# Patient Record
Sex: Female | Born: 1960 | Hispanic: Yes | Marital: Single | Smoking: Never smoker
Health system: Southern US, Community
[De-identification: ages and names within clinical notes are randomized; demographics above are authoritative.]

## PROBLEM LIST (undated history)

## (undated) DIAGNOSIS — Z9889 Other specified postprocedural states: Secondary | ICD-10-CM

## (undated) DIAGNOSIS — K649 Unspecified hemorrhoids: Secondary | ICD-10-CM

## (undated) DIAGNOSIS — R112 Nausea with vomiting, unspecified: Secondary | ICD-10-CM

## (undated) DIAGNOSIS — F419 Anxiety disorder, unspecified: Secondary | ICD-10-CM

## (undated) DIAGNOSIS — N390 Urinary tract infection, site not specified: Secondary | ICD-10-CM

## (undated) DIAGNOSIS — F32A Depression, unspecified: Secondary | ICD-10-CM

## (undated) DIAGNOSIS — J189 Pneumonia, unspecified organism: Secondary | ICD-10-CM

## (undated) DIAGNOSIS — T8859XA Other complications of anesthesia, initial encounter: Secondary | ICD-10-CM

## (undated) DIAGNOSIS — N186 End stage renal disease: Secondary | ICD-10-CM

## (undated) DIAGNOSIS — K219 Gastro-esophageal reflux disease without esophagitis: Secondary | ICD-10-CM

## (undated) DIAGNOSIS — I10 Essential (primary) hypertension: Secondary | ICD-10-CM

## (undated) DIAGNOSIS — K2970 Gastritis, unspecified, without bleeding: Secondary | ICD-10-CM

## (undated) DIAGNOSIS — F329 Major depressive disorder, single episode, unspecified: Secondary | ICD-10-CM

## (undated) DIAGNOSIS — T4145XA Adverse effect of unspecified anesthetic, initial encounter: Secondary | ICD-10-CM

## (undated) DIAGNOSIS — Z8489 Family history of other specified conditions: Secondary | ICD-10-CM

## (undated) DIAGNOSIS — D649 Anemia, unspecified: Secondary | ICD-10-CM

## (undated) HISTORY — DX: Urinary tract infection, site not specified: N39.0

## (undated) HISTORY — DX: Anemia, unspecified: D64.9

---

## 1999-10-11 ENCOUNTER — Observation Stay (HOSPITAL_COMMUNITY): Admission: AD | Admit: 1999-10-11 | Discharge: 1999-10-12 | Payer: Self-pay | Admitting: *Deleted

## 1999-10-11 ENCOUNTER — Encounter (INDEPENDENT_AMBULATORY_CARE_PROVIDER_SITE_OTHER): Payer: Self-pay

## 1999-10-11 ENCOUNTER — Encounter: Payer: Self-pay | Admitting: *Deleted

## 2002-08-11 ENCOUNTER — Inpatient Hospital Stay (HOSPITAL_COMMUNITY): Admission: AD | Admit: 2002-08-11 | Discharge: 2002-08-11 | Payer: Self-pay | Admitting: Family Medicine

## 2002-08-18 ENCOUNTER — Inpatient Hospital Stay (HOSPITAL_COMMUNITY): Admission: AD | Admit: 2002-08-18 | Discharge: 2002-08-18 | Payer: Self-pay | Admitting: Obstetrics and Gynecology

## 2002-08-18 ENCOUNTER — Encounter: Payer: Self-pay | Admitting: Family Medicine

## 2002-10-18 ENCOUNTER — Encounter: Admission: RE | Admit: 2002-10-18 | Discharge: 2002-10-18 | Payer: Self-pay | Admitting: *Deleted

## 2002-10-25 ENCOUNTER — Encounter: Admission: RE | Admit: 2002-10-25 | Discharge: 2002-10-25 | Payer: Self-pay | Admitting: *Deleted

## 2002-11-15 ENCOUNTER — Ambulatory Visit (HOSPITAL_COMMUNITY): Admission: RE | Admit: 2002-11-15 | Discharge: 2002-11-15 | Payer: Self-pay | Admitting: *Deleted

## 2002-11-22 ENCOUNTER — Encounter: Admission: RE | Admit: 2002-11-22 | Discharge: 2002-11-22 | Payer: Self-pay | Admitting: *Deleted

## 2002-11-29 ENCOUNTER — Encounter: Admission: RE | Admit: 2002-11-29 | Discharge: 2002-11-29 | Payer: Self-pay | Admitting: *Deleted

## 2002-12-06 ENCOUNTER — Encounter: Admission: RE | Admit: 2002-12-06 | Discharge: 2002-12-06 | Payer: Self-pay | Admitting: *Deleted

## 2002-12-13 ENCOUNTER — Encounter: Admission: RE | Admit: 2002-12-13 | Discharge: 2002-12-13 | Payer: Self-pay | Admitting: *Deleted

## 2002-12-28 HISTORY — PX: TUBAL LIGATION: SHX77

## 2003-01-10 ENCOUNTER — Encounter: Admission: RE | Admit: 2003-01-10 | Discharge: 2003-01-10 | Payer: Self-pay | Admitting: *Deleted

## 2003-01-14 ENCOUNTER — Inpatient Hospital Stay (HOSPITAL_COMMUNITY): Admission: AD | Admit: 2003-01-14 | Discharge: 2003-01-14 | Payer: Self-pay | Admitting: *Deleted

## 2003-01-24 ENCOUNTER — Encounter: Admission: RE | Admit: 2003-01-24 | Discharge: 2003-01-24 | Payer: Self-pay | Admitting: *Deleted

## 2003-02-07 ENCOUNTER — Encounter: Admission: RE | Admit: 2003-02-07 | Discharge: 2003-02-07 | Payer: Self-pay | Admitting: *Deleted

## 2003-02-09 ENCOUNTER — Ambulatory Visit (HOSPITAL_COMMUNITY): Admission: RE | Admit: 2003-02-09 | Discharge: 2003-02-09 | Payer: Self-pay | Admitting: *Deleted

## 2003-02-09 ENCOUNTER — Encounter: Payer: Self-pay | Admitting: *Deleted

## 2003-02-21 ENCOUNTER — Encounter: Admission: RE | Admit: 2003-02-21 | Discharge: 2003-02-21 | Payer: Self-pay | Admitting: *Deleted

## 2003-02-21 ENCOUNTER — Encounter (HOSPITAL_COMMUNITY): Admission: RE | Admit: 2003-02-21 | Discharge: 2003-03-15 | Payer: Self-pay | Admitting: *Deleted

## 2003-02-28 ENCOUNTER — Encounter: Admission: RE | Admit: 2003-02-28 | Discharge: 2003-02-28 | Payer: Self-pay | Admitting: *Deleted

## 2003-03-07 ENCOUNTER — Encounter: Payer: Self-pay | Admitting: *Deleted

## 2003-03-07 ENCOUNTER — Encounter: Admission: RE | Admit: 2003-03-07 | Discharge: 2003-03-07 | Payer: Self-pay | Admitting: *Deleted

## 2003-03-14 ENCOUNTER — Inpatient Hospital Stay (HOSPITAL_COMMUNITY): Admission: AD | Admit: 2003-03-14 | Discharge: 2003-03-19 | Payer: Self-pay | Admitting: Obstetrics and Gynecology

## 2003-03-14 ENCOUNTER — Encounter: Admission: RE | Admit: 2003-03-14 | Discharge: 2003-03-14 | Payer: Self-pay | Admitting: *Deleted

## 2003-03-15 ENCOUNTER — Encounter (INDEPENDENT_AMBULATORY_CARE_PROVIDER_SITE_OTHER): Payer: Self-pay

## 2003-03-16 ENCOUNTER — Encounter (INDEPENDENT_AMBULATORY_CARE_PROVIDER_SITE_OTHER): Payer: Self-pay

## 2003-09-26 ENCOUNTER — Encounter (INDEPENDENT_AMBULATORY_CARE_PROVIDER_SITE_OTHER): Payer: Self-pay | Admitting: Specialist

## 2003-09-26 ENCOUNTER — Encounter: Payer: Self-pay | Admitting: Nephrology

## 2003-09-26 ENCOUNTER — Ambulatory Visit (HOSPITAL_COMMUNITY): Admission: RE | Admit: 2003-09-26 | Discharge: 2003-09-27 | Payer: Self-pay | Admitting: Nephrology

## 2004-02-06 ENCOUNTER — Ambulatory Visit (HOSPITAL_COMMUNITY): Admission: RE | Admit: 2004-02-06 | Discharge: 2004-02-06 | Payer: Self-pay | Admitting: Family Medicine

## 2004-03-09 ENCOUNTER — Emergency Department (HOSPITAL_COMMUNITY): Admission: EM | Admit: 2004-03-09 | Discharge: 2004-03-09 | Payer: Self-pay | Admitting: Emergency Medicine

## 2004-07-23 ENCOUNTER — Inpatient Hospital Stay (HOSPITAL_COMMUNITY): Admission: AD | Admit: 2004-07-23 | Discharge: 2004-07-23 | Payer: Self-pay | Admitting: Obstetrics and Gynecology

## 2004-07-30 ENCOUNTER — Encounter: Admission: RE | Admit: 2004-07-30 | Discharge: 2004-07-30 | Payer: Self-pay | Admitting: *Deleted

## 2004-07-30 ENCOUNTER — Inpatient Hospital Stay (HOSPITAL_COMMUNITY): Admission: AD | Admit: 2004-07-30 | Discharge: 2004-07-30 | Payer: Self-pay | Admitting: Obstetrics and Gynecology

## 2004-08-03 ENCOUNTER — Inpatient Hospital Stay (HOSPITAL_COMMUNITY): Admission: AD | Admit: 2004-08-03 | Discharge: 2004-08-03 | Payer: Self-pay | Admitting: Family Medicine

## 2004-08-07 ENCOUNTER — Encounter: Admission: RE | Admit: 2004-08-07 | Discharge: 2004-08-07 | Payer: Self-pay | Admitting: Obstetrics and Gynecology

## 2004-08-22 ENCOUNTER — Encounter: Admission: RE | Admit: 2004-08-22 | Discharge: 2004-08-22 | Payer: Self-pay | Admitting: Family Medicine

## 2004-09-19 ENCOUNTER — Ambulatory Visit: Payer: Self-pay | Admitting: Nurse Practitioner

## 2005-05-11 ENCOUNTER — Ambulatory Visit: Payer: Self-pay | Admitting: Nurse Practitioner

## 2005-05-27 ENCOUNTER — Ambulatory Visit: Payer: Self-pay | Admitting: Nurse Practitioner

## 2005-05-28 ENCOUNTER — Ambulatory Visit: Payer: Self-pay | Admitting: *Deleted

## 2005-06-10 ENCOUNTER — Ambulatory Visit: Payer: Self-pay | Admitting: Nurse Practitioner

## 2005-06-24 ENCOUNTER — Ambulatory Visit: Payer: Self-pay | Admitting: Nurse Practitioner

## 2005-07-03 ENCOUNTER — Ambulatory Visit (HOSPITAL_COMMUNITY): Admission: RE | Admit: 2005-07-03 | Discharge: 2005-07-03 | Payer: Self-pay | Admitting: Family Medicine

## 2005-07-28 ENCOUNTER — Ambulatory Visit: Payer: Self-pay | Admitting: Nurse Practitioner

## 2006-03-18 ENCOUNTER — Ambulatory Visit: Payer: Self-pay | Admitting: Nurse Practitioner

## 2006-04-07 ENCOUNTER — Ambulatory Visit: Payer: Self-pay | Admitting: Nurse Practitioner

## 2006-04-13 ENCOUNTER — Ambulatory Visit: Payer: Self-pay | Admitting: Nurse Practitioner

## 2006-04-29 ENCOUNTER — Encounter: Admission: RE | Admit: 2006-04-29 | Discharge: 2006-04-29 | Payer: Self-pay | Admitting: Internal Medicine

## 2006-12-02 ENCOUNTER — Inpatient Hospital Stay (HOSPITAL_COMMUNITY): Admission: AD | Admit: 2006-12-02 | Discharge: 2006-12-02 | Payer: Self-pay | Admitting: Obstetrics & Gynecology

## 2006-12-08 ENCOUNTER — Ambulatory Visit: Payer: Self-pay | Admitting: Nurse Practitioner

## 2007-08-22 ENCOUNTER — Ambulatory Visit: Payer: Self-pay | Admitting: Internal Medicine

## 2007-09-14 ENCOUNTER — Encounter (INDEPENDENT_AMBULATORY_CARE_PROVIDER_SITE_OTHER): Payer: Self-pay | Admitting: *Deleted

## 2007-10-13 ENCOUNTER — Ambulatory Visit: Payer: Self-pay | Admitting: Internal Medicine

## 2009-02-26 ENCOUNTER — Ambulatory Visit: Payer: Self-pay | Admitting: Internal Medicine

## 2009-02-27 ENCOUNTER — Encounter (INDEPENDENT_AMBULATORY_CARE_PROVIDER_SITE_OTHER): Payer: Self-pay | Admitting: Internal Medicine

## 2009-02-27 LAB — CONVERTED CEMR LAB
AST: 16 units/L (ref 0–37)
Alkaline Phosphatase: 105 units/L (ref 39–117)
Microalb, Ur: 201.02 mg/dL — ABNORMAL HIGH (ref 0.00–1.89)
Sodium: 139 meq/L (ref 135–145)
Total Bilirubin: 0.2 mg/dL — ABNORMAL LOW (ref 0.3–1.2)
Total Protein: 7.1 g/dL (ref 6.0–8.3)

## 2009-03-25 ENCOUNTER — Ambulatory Visit: Payer: Self-pay | Admitting: Internal Medicine

## 2009-03-25 ENCOUNTER — Encounter (INDEPENDENT_AMBULATORY_CARE_PROVIDER_SITE_OTHER): Payer: Self-pay | Admitting: Internal Medicine

## 2009-03-25 LAB — CONVERTED CEMR LAB
ALT: 8 units/L (ref 0–35)
AST: 10 units/L (ref 0–37)
Albumin: 4.1 g/dL (ref 3.5–5.2)
CO2: 16 meq/L — ABNORMAL LOW (ref 19–32)
Calcium: 8.5 mg/dL (ref 8.4–10.5)
Chloride: 111 meq/L (ref 96–112)
Glucose, Bld: 85 mg/dL (ref 70–99)
Microalb, Ur: 203.95 mg/dL — ABNORMAL HIGH (ref 0.00–1.89)
Potassium: 4.1 meq/L (ref 3.5–5.3)
Sodium: 137 meq/L (ref 135–145)

## 2009-03-28 ENCOUNTER — Ambulatory Visit: Payer: Self-pay | Admitting: Internal Medicine

## 2009-04-11 ENCOUNTER — Ambulatory Visit: Payer: Self-pay | Admitting: Internal Medicine

## 2009-05-21 ENCOUNTER — Ambulatory Visit: Payer: Self-pay | Admitting: Internal Medicine

## 2009-06-07 ENCOUNTER — Encounter: Admission: RE | Admit: 2009-06-07 | Discharge: 2009-06-07 | Payer: Self-pay | Admitting: Nephrology

## 2009-06-25 ENCOUNTER — Ambulatory Visit: Payer: Self-pay | Admitting: Vascular Surgery

## 2009-07-05 ENCOUNTER — Ambulatory Visit (HOSPITAL_COMMUNITY): Admission: RE | Admit: 2009-07-05 | Discharge: 2009-07-05 | Payer: Self-pay | Admitting: Vascular Surgery

## 2009-07-05 ENCOUNTER — Ambulatory Visit: Payer: Self-pay | Admitting: Vascular Surgery

## 2009-07-05 HISTORY — PX: AV FISTULA PLACEMENT: SHX1204

## 2009-07-22 ENCOUNTER — Emergency Department (HOSPITAL_COMMUNITY): Admission: EM | Admit: 2009-07-22 | Discharge: 2009-07-23 | Payer: Self-pay | Admitting: Emergency Medicine

## 2009-08-06 ENCOUNTER — Ambulatory Visit: Payer: Self-pay | Admitting: Vascular Surgery

## 2010-03-25 DIAGNOSIS — I12 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 disease: Secondary | ICD-10-CM | POA: Insufficient documentation

## 2010-04-01 ENCOUNTER — Ambulatory Visit (HOSPITAL_COMMUNITY): Admission: RE | Admit: 2010-04-01 | Discharge: 2010-04-01 | Payer: Self-pay | Admitting: Vascular Surgery

## 2010-04-01 ENCOUNTER — Ambulatory Visit: Payer: Self-pay | Admitting: Vascular Surgery

## 2010-04-08 ENCOUNTER — Emergency Department (HOSPITAL_COMMUNITY): Admission: EM | Admit: 2010-04-08 | Discharge: 2010-04-08 | Payer: Self-pay | Admitting: Emergency Medicine

## 2010-04-23 ENCOUNTER — Ambulatory Visit: Payer: Self-pay | Admitting: Vascular Surgery

## 2010-05-02 ENCOUNTER — Ambulatory Visit (HOSPITAL_COMMUNITY): Admission: RE | Admit: 2010-05-02 | Discharge: 2010-05-02 | Payer: Self-pay | Admitting: Vascular Surgery

## 2010-05-02 ENCOUNTER — Ambulatory Visit: Payer: Self-pay | Admitting: Vascular Surgery

## 2010-05-08 ENCOUNTER — Encounter (INDEPENDENT_AMBULATORY_CARE_PROVIDER_SITE_OTHER): Payer: Self-pay | Admitting: Family Medicine

## 2010-05-19 ENCOUNTER — Encounter (INDEPENDENT_AMBULATORY_CARE_PROVIDER_SITE_OTHER): Payer: Self-pay | Admitting: *Deleted

## 2010-07-04 ENCOUNTER — Ambulatory Visit: Payer: Self-pay

## 2010-07-04 ENCOUNTER — Ambulatory Visit (HOSPITAL_COMMUNITY): Admission: RE | Admit: 2010-07-04 | Discharge: 2010-07-04 | Payer: Self-pay | Admitting: Nephrology

## 2010-07-04 ENCOUNTER — Ambulatory Visit: Payer: Self-pay | Admitting: Cardiology

## 2010-07-16 ENCOUNTER — Ambulatory Visit: Payer: Self-pay | Admitting: Vascular Surgery

## 2010-08-25 ENCOUNTER — Ambulatory Visit (HOSPITAL_COMMUNITY): Admission: RE | Admit: 2010-08-25 | Discharge: 2010-08-25 | Payer: Self-pay | Admitting: Vascular Surgery

## 2010-08-25 ENCOUNTER — Ambulatory Visit: Payer: Self-pay | Admitting: Vascular Surgery

## 2010-09-22 ENCOUNTER — Ambulatory Visit (HOSPITAL_COMMUNITY): Admission: RE | Admit: 2010-09-22 | Discharge: 2010-09-22 | Payer: Self-pay | Admitting: Nephrology

## 2010-10-22 ENCOUNTER — Encounter (INDEPENDENT_AMBULATORY_CARE_PROVIDER_SITE_OTHER): Payer: Self-pay | Admitting: *Deleted

## 2010-10-22 LAB — CONVERTED CEMR LAB
Cholesterol: 167 mg/dL (ref 0–200)
Total CHOL/HDL Ratio: 2.8
Triglycerides: 149 mg/dL (ref <150)

## 2011-01-17 ENCOUNTER — Encounter: Payer: Self-pay | Admitting: Family Medicine

## 2011-01-27 NOTE — Progress Notes (Signed)
Summary: Office Visit  Office Visit   Imported By: Charlton Amor, [place] 07/09/2010 09:24:43  _____________________________________________________________________  External Attachment:    Type:   Image     Comment:   External Document

## 2011-03-17 LAB — POCT I-STAT, CHEM 8
BUN: 16 mg/dL (ref 6–23)
Calcium, Ion: 0.96 mmol/L — ABNORMAL LOW (ref 1.12–1.32)
Glucose, Bld: 85 mg/dL (ref 70–99)
HCT: 44 % (ref 36.0–46.0)
Hemoglobin: 15 g/dL (ref 12.0–15.0)
Potassium: 3.7 mEq/L (ref 3.5–5.1)
Sodium: 136 mEq/L (ref 135–145)
TCO2: 27 mmol/L (ref 0–100)

## 2011-03-18 LAB — POCT I-STAT, CHEM 8
BUN: 11 mg/dL (ref 6–23)
Calcium, Ion: 1.06 mmol/L — ABNORMAL LOW (ref 1.12–1.32)
Creatinine, Ser: 4.4 mg/dL — ABNORMAL HIGH (ref 0.4–1.2)
HCT: 33 % — ABNORMAL LOW (ref 36.0–46.0)
Hemoglobin: 11.2 g/dL — ABNORMAL LOW (ref 12.0–15.0)
Sodium: 135 mEq/L (ref 135–145)
TCO2: 28 mmol/L (ref 0–100)

## 2011-03-18 LAB — GLUCOSE, CAPILLARY: Glucose-Capillary: 108 mg/dL — ABNORMAL HIGH (ref 70–99)

## 2011-03-18 LAB — POCT I-STAT 4, (NA,K, GLUC, HGB,HCT): HCT: 24 % — ABNORMAL LOW (ref 36.0–46.0)

## 2011-04-05 LAB — COMPREHENSIVE METABOLIC PANEL
Albumin: 3.3 g/dL — ABNORMAL LOW (ref 3.5–5.2)
BUN: 46 mg/dL — ABNORMAL HIGH (ref 6–23)
Creatinine, Ser: 4.84 mg/dL — ABNORMAL HIGH (ref 0.4–1.2)
Glucose, Bld: 93 mg/dL (ref 70–99)
Potassium: 3.6 mEq/L (ref 3.5–5.1)
Total Protein: 6.8 g/dL (ref 6.0–8.3)

## 2011-04-05 LAB — POCT I-STAT 4, (NA,K, GLUC, HGB,HCT)
Glucose, Bld: 100 mg/dL — ABNORMAL HIGH (ref 70–99)
HCT: 33 % — ABNORMAL LOW (ref 36.0–46.0)
Hemoglobin: 11.2 g/dL — ABNORMAL LOW (ref 12.0–15.0)
Sodium: 139 mEq/L (ref 135–145)

## 2011-04-05 LAB — DIFFERENTIAL
Eosinophils Absolute: 0.1 10*3/uL (ref 0.0–0.7)
Eosinophils Relative: 1 % (ref 0–5)
Neutro Abs: 5.1 10*3/uL (ref 1.7–7.7)
Neutrophils Relative %: 64 % (ref 43–77)

## 2011-04-05 LAB — CBC
MCV: 88.4 fL (ref 78.0–100.0)
RBC: 3.42 MIL/uL — ABNORMAL LOW (ref 3.87–5.11)
WBC: 7.9 10*3/uL (ref 4.0–10.5)

## 2011-04-05 LAB — LIPASE, BLOOD: Lipase: 47 U/L (ref 11–59)

## 2011-04-15 ENCOUNTER — Ambulatory Visit: Payer: Self-pay | Admitting: Vascular Surgery

## 2011-04-21 ENCOUNTER — Other Ambulatory Visit (HOSPITAL_COMMUNITY): Payer: Self-pay | Admitting: Family Medicine

## 2011-04-21 DIAGNOSIS — Z1231 Encounter for screening mammogram for malignant neoplasm of breast: Secondary | ICD-10-CM

## 2011-04-22 ENCOUNTER — Ambulatory Visit (INDEPENDENT_AMBULATORY_CARE_PROVIDER_SITE_OTHER): Payer: Self-pay | Admitting: Vascular Surgery

## 2011-04-22 DIAGNOSIS — N186 End stage renal disease: Secondary | ICD-10-CM

## 2011-04-22 DIAGNOSIS — T82898A Other specified complication of vascular prosthetic devices, implants and grafts, initial encounter: Secondary | ICD-10-CM

## 2011-04-23 NOTE — Assessment & Plan Note (Signed)
OFFICE VISIT  Sabrina Mitchell, [place] Sabrina Mitchell DOB:  1961/06/16                                       04/22/2011 [id number]  I saw patient in the office today for evaluation for possible steal of the left upper extremity.  The history is obtained through her translator.  She had a left brachial cephalic fistula placed in July 2010.  In May 2011, she had a fistulogram because of a poorly maturing fistula, but this showed no evidence of narrowing within the fistula. It was 8 mm in diameter.  Apparently the function improved, and she has been using this for dialysis without any problems.  However, approximately 6 months ago she noted the gradual onset of pain in her left hand, which has been gradually progressing.  Through the translator, she describes significant pain in the left forearm and hand, which increases when she is on dialysis.  There are no other aggravating or alleviating factors.  PAST MEDICAL HISTORY:  Significant for chronic kidney disease, hypertension, and hypercholesterolemia.  She denies any history of diabetes, history of previous myocardial infarction, history of congestive heart failure, history of COPD.  SOCIAL HISTORY:  She is married.  She has 2 children.  She does not use tobacco.  FAMILY HISTORY:  There is no history of premature cardiovascular disease.  REVIEW OF SYSTEMS:  GENERAL:  She has had no recent weight loss, weight gain, or problems with her appetite. CARDIOVASCULAR:  She does describe some occasional chest pain which has been stable.  She had no orthopnea or significant dyspnea on exertion. She has had no history of stroke or TIAs.  She has had no history of DVT or phlebitis.  NEUROLOGIC:  She has occasional dizziness and headaches. HEMATOLOGIC:  She does have a history of anemia. PSYCHIATRIC:  She had a history of anxiety. URINARY:  She has no dysuria or frequency. GI, pulmonary, ENT, musculoskeletal,  integumentary review of systems is unremarkable and is documented on the medical history form in her chart.  PHYSICAL EXAMINATION:  This is a pleasant 50 year old woman who appears her stated age.  Blood pressure is 110/66, heart rate is 69, respiratory rate is 20.  HEENT:  Unremarkable.  Lungs are clear bilaterally to auscultation without rales, rhonchi or wheezing.  Cardiovascular:  I do not detect any carotid bruits.  She has a regular rate and rhythm.  She has palpable femoral pulses.  She has palpable but slightly diminished left radial pulse with a palpable right radial pulse.  She has a monophasic radial, ulnar, and palmar arch signal with a Doppler which augments with compression of her fistula.  Abdomen:  Soft and nontender with normal-pitched bowel sounds.  Musculoskeletal:  There are no major deformities or cyanosis.  Her upper arm fistula has an excellent thrill. Neurologic:  She has no focal weakness or paresthesias.  Skin:  There are no ulcers or rashes.  I have reviewed her previous fistulogram which was performed by Dr. Oneida Alar and showed no problems with her fistula.  Based on her history, I think she does have evidence of a steal syndrome with significant augmentation of the Doppler signals in her hand with compression of her fistula.  I think she should be evaluated for a DRIL procedure.  For this reason, I have recommended we proceed with upper extremity arteriography on the left via a right  femoral approach to fully assess the arterial system on the right and plan a possible DRIL procedure.  I think this is the best chance of maintaining her access and resolving her steal symptoms.  This has been scheduled for May 7. We will make further recommendations pending these results.    Judeth Cornfield. Scot Dock, M.D. Electronically Signed  CSD/MEDQ  D:  04/22/2011  T:  04/23/2011  Job:  [id number]  cc:   Louis Meckel, M.D.

## 2011-04-24 ENCOUNTER — Ambulatory Visit (HOSPITAL_COMMUNITY): Payer: Self-pay

## 2011-05-01 ENCOUNTER — Ambulatory Visit (HOSPITAL_COMMUNITY)
Admission: RE | Admit: 2011-05-01 | Discharge: 2011-05-01 | Disposition: A | Discharge status: Home / Self Care | Payer: Self-pay | Source: Ambulatory Visit | Attending: Family Medicine | Admitting: Family Medicine

## 2011-05-01 DIAGNOSIS — Z1231 Encounter for screening mammogram for malignant neoplasm of breast: Secondary | ICD-10-CM | POA: Insufficient documentation

## 2011-05-04 ENCOUNTER — Ambulatory Visit (HOSPITAL_COMMUNITY): Admission: RE | Admit: 2011-05-04 | Payer: Self-pay | Source: Ambulatory Visit | Admitting: Vascular Surgery

## 2011-05-12 NOTE — Assessment & Plan Note (Signed)
OFFICE VISIT   Sabrina Mitchell  DOB:  09-17-1961                                       07/16/2010  [id number]   The patient returns for followup today.  She recently had a fistulogram  which showed no significant stenosis.  Her daughter says today that they  dialyzed her successfully the last three times using the fistula alone.  She does still have a right-sided catheter in place.   PHYSICAL EXAM:  Today blood pressure is 136/76 in the right arm, heart  rate 66 and regular.  Temperature is 97.5.  Right-sided catheter has no  evidence of infection.  She has an easily palpable thrill in the  brachiocephalic fistula and this is becoming more prominent over time.  Daughter also states she is exercising the fistula.   At this point the patient's fistula is working well and they are  successfully cannulating it without difficulty.  She will follow up with  [place] on an as-needed basis.  The dialysis center will call to schedule  catheter removal if they are content with her fistula in the near  future.     Jessy Oto. Fields, MD  Electronically Signed   CEF/MEDQ  D:  07/16/2010  T:  07/17/2010  Job:  [id number]   cc:   Sherril Croon, M.D.

## 2011-05-12 NOTE — Op Note (Signed)
NAME:  Sabrina Mitchell      ACCOUNT NO.:  [id number]   MEDICAL RECORD NO.:  [id number]          PATIENT TYPE:  AMB   LOCATION:  SDS                          FACILITY:  [facility]   PHYSICIAN:  Nelda Severe. Kellie Simmering, M.D.  DATE OF BIRTH:  25-Dec-1961   DATE OF PROCEDURE:  07/05/2009  DATE OF DISCHARGE:  07/05/2009                               OPERATIVE REPORT   PREOPERATIVE DIAGNOSIS:  End-stage renal disease.   POSTOPERATIVE DIAGNOSIS:  End-stage renal disease.   OPERATION:  Creation of a left upper arm brachial artery to cephalic  vein arteriovenous fistula Terie Purser shunt).   SURGEON:  Nelda Severe. Kellie Simmering, MD   FIRST ASSISTANT:  Jacinta Shoe, P.A.   ANESTHESIA:  Local.   PROCEDURE:  The patient was taken to the operating room, placed in  supine position at which time the left upper extremity was prepped with  Betadine scrub and solution, and draped in routine sterile manner.  After infiltration of 1% Xylocaine with epinephrine, transverse incision  was made in the antecubital area, antecubital vein was dissected free.  Cephalic branch was about 3-3.5 mm in size, dissected free distally,  transected after ligating it.  Brachial artery was exposed beneath the  fascia, was a small vessel 2.5 mm in size, but had an excellent pulse.  It was occluded proximally and distally with vessel loops, opened with  15 blade, and extended with Pott scissors.  Vein was carefully measured  and spatulated and anastomosed end-to-side with 6-0 Prolene.  Vessel  loops then released.  There was an excellent pulse and thrill in the  fistula.  No heparin or protamine was given.  Wound was irrigated with  saline, closed in layers with Vicryl in subcuticular fashion.  Sterile  dressing applied.  The patient taken to recovery room in satisfactory  condition.      Nelda Severe Kellie Simmering, M.D.  Electronically Signed     JDL/MEDQ  D:  07/05/2009  T:  07/06/2009  Job:  [id number]

## 2011-05-12 NOTE — Consult Note (Signed)
NEW PATIENT CONSULTATION   Sabrina Mitchell  DOB:  [birth year]                                       06/25/2009  [id number]   The patient is a Hispanic female patient with end-stage renal disease  not yet on hemodialysis.  She was evaluated today for vascular access  and referred by Dr. Justin Mend.  She is right-handed.   On examination today she has blood pressure 150/93, heart rate 79.  She  has palpable brachial and radial pulses at 3+ bilaterally.  Both hands  are well-perfused.   Vein mapping revealed patent cephalic veins in both upper extremities  which become adequate size at about the antecubital area.  Left arm is  slightly larger than ideal, but hopefully will be a candidate for upper  extremity fistula on the left.  Will schedule that for Friday, July 9 at  [facility] as an outpatient and hopefully this will provide  satisfactory site for vascular access.   Sabrina Mitchell, M.D.  Electronically Signed   JDL/MEDQ  D:  06/25/2009  T:  06/26/2009  Job:  [id number]   cc:   Sherril Croon, M.D.

## 2011-05-12 NOTE — Procedures (Signed)
CEPHALIC VEIN MAPPING   INDICATION:  End-stage renal disease.   HISTORY:  End-stage renal disease.   EXAM:   The right cephalic vein is compressible.   Diameter measurements range from 0.37 to 0.54.   The left cephalic vein is compressible.   Diameter measurements range from 0.25 to 0.41   See attached worksheet for all measurements.   IMPRESSION:  Patent bilateral cephalic veins which are of acceptable  diameter for use as a dialysis access site.   ___________________________________________  Nelda Severe. Kellie Simmering, M.D.   MG/MEDQ  D:  06/25/2009  T:  06/25/2009  Job:  [id number]

## 2011-05-12 NOTE — Assessment & Plan Note (Signed)
OFFICE VISIT   Carlos American  DOB:  1961/07/08                                       08/06/2009  [id number]   The patient had an AV fistula the left upper arm created by me on July  9.  The fistula today has an excellent pulse and palpable thrill up with  a well-healed incision.  There is no evidence of steal distally with  warm, well-perfused hand.  Fistula can be utilized in early October,  although patient is not currently on dialysis.  Return to [place] on a p.r.n.  basis.   Nelda Severe Kellie Simmering, M.D.  Electronically Signed   JDL/MEDQ  D:  08/06/2009  T:  08/07/2009  Job:  [id number]   cc:   Sherril Croon, M.D.

## 2011-05-12 NOTE — Assessment & Plan Note (Signed)
OFFICE VISIT   Sabrina Mitchell  DOB:  [birth year]                                       04/23/2010  [id number]   The patient returns for followup today.  She recently had a right side  Palindrome catheter placed after infiltration of her left arm AV  fistula.  Her left brachiocephalic AV fistula was placed by Dr. Kellie Simmering  in July of 2010.  It is still overall fairly small in character.  She  states that her catheter has been working well without difficulty.  She  was interviewed today with her daughter as the patient herself speaks no  [place].  Her daughter acted as Optometrist.   REVIEW OF SYSTEMS:  She denies any shortness of breath or chest pain  currently.   PHYSICAL EXAM:  Blood pressure is 114/73 in the left arm, heart rate 64  and regular.  Temperature is 98.2.  Left upper extremity she has an  audible bruit and palpable thrill in the fistula.  However, overall the  fistula is fairly small in character.  Right-sided catheter is in place  with no evidence of infection.   Since the patient's fistula is still fairly small in character and they  have had trouble with infiltration I believe she needs a fistulogram to  see if there is something we can do to make the fistula more prominent  or a better fistula for her.  We have scheduled her for a fistulogram a  week from Friday.  Risks, benefits, possible complications and procedure  details were explained to the patient today including but not limited to  bleeding, infection, possible angioplasty of the fistula.  She  understands and agrees to proceed.     Jessy Oto. Fields, MD  Electronically Signed   CEF/MEDQ  D:  04/23/2010  T:  04/24/2010  Job:  [id number]

## 2011-05-15 NOTE — Op Note (Signed)
NAME:  Sabrina Mitchell                ACCOUNT NO.:  [id number]   MEDICAL RECORD NO.:  [id number]                   PATIENT TYPE:  INP   LOCATION:  9178                                 FACILITY:  [facility]   PHYSICIAN:  Tanya S. Kennon Rounds, M.D.                DATE OF BIRTH:  12-26-1961   DATE OF PROCEDURE:  03/16/2003  DATE OF DISCHARGE:                                 OPERATIVE REPORT   PREOPERATIVE DIAGNOSES:  1. Multiparity.  2. Undesired fertility.   POSTOPERATIVE DIAGNOSES:  1. Multiparity.  2. Undesired fertility.   PROCEDURE:  Postpartum bilateral tubal ligation.   SURGEON:  Standley Dakins. Kennon Rounds, M.D.   ASSISTANT:  __________.   ANESTHESIA:  Epidural.   FINDINGS:  Normal-appearing uterus and tubes and ovaries.   ESTIMATED BLOOD LOSS:  Less than 25 mL.   COMPLICATIONS:  None.   SPECIMENS:  Tubes to pathology.   INDICATIONS:  The patient is a 50 year old G4, para 2-0-2-2 who is  postpartum day 0 from a spontaneous vaginal delivery at 37 weeks that is  complicated by preeclampsia.  She desires permanently.  She was then  counseled regarding the risks and benefits of this procedure including the  permanence of this procedure and risks of pregnancy which includes risks of  ectopic as well as general risks of surgery including bleeding, infection,  injury to surrounding structures and even death.  The patient had all  consents signed and desired to proceed.   DESCRIPTION OF PROCEDURE:  The patient was placed in the supine position.  Her epidural catheter was then dosed with analgesic medicine.  When this was  found to be adequate, she was prepped and draped in the usual sterile  fashion.  An alpha clamp was used to ensure adequate analgesia and 4 mL of  0.25% Marcaine had been injected infraumbilically.  A 1.5 mm incision was  made in the skin infraumbilically.  It was carried down to the underlying  fascia.  The peritoneum was grasped and entered sharply with  Metzenbaum  scissors.  The retractor was then placed inside the abdominal cavity, and  the left tube was identified and followed to its fimbriated end.  This was  grasped with a Babcock clamp approximately 1 cm from the fundus of the  uterus and a 1 cm knuckle of tube was tied with 0 plain suture.  A second 0  plain was tied below this, and the tube was excised and sent to pathology.  Before suturing the abdomen, hemostasis was noted.  Similarly, the right  tube was grasped and followed to its fimbriated end.  A 1 cm knuckle of tube  approximately 1.5 cm from the cornua was brought up in a Babcock clamp and  tied with 0 plain suture.  A second 0 plain was placed below this, and a 1  cm segment of tube was excised.  This tube was sent to pathology.  Hemostasis was obtained, and this  tube was returned to the abdominal cavity.  The fascia was then closed with a 0 Vicryl running suture.  The skin was  closed with a subcuticular 3-0 Vicryl.  The patient tolerated the procedure  well.  All instrument, needle and lap counts were correct x2.  The patient  was awakened and taken to the recovery room in stable condition.                                               Standley Dakins. Kennon Rounds, M.D.    TSP/MEDQ  D:  03/16/2003  T:  03/17/2003  Job:  [id number]

## 2011-05-15 NOTE — Discharge Summary (Signed)
NAME:  Sabrina Mitchell                ACCOUNT NO.:  [id number]   MEDICAL RECORD NO.:  [id number]                   PATIENT TYPE:  INP   LOCATION:  9103                                 FACILITY:  [facility]   PHYSICIAN:  Phil D. Kalman Shan, M.D.                  DATE OF BIRTH:  [birth year]   DATE OF ADMISSION:  03/14/2003  DATE OF DISCHARGE:  03/19/2003                                 DISCHARGE SUMMARY   DISCHARGE DIAGNOSES:  1. Pregnancy induced hypertension.  2. Chronic hypertension.  3. Partial placental abruption.  4. Preeclampsia.  5. Chronic kidney disease.  6. Anemia.   CONSULTATIONS:  OB/GYN.   PROCEDURE:  Bilateral tubal ligation.   PERTINENT LABORATORY INFORMATION:  Final CBC:  White blood cell count 11.2,  hemoglobin 9.6, hematocrit 28.0 with an MCV of 88.8 on March 17, 2003.   HOSPITAL COURSE:  The patient is a G4 now P2-0-2-2 who presented at 78 and 4  weeks by 7-week ultrasound [facility] with [facility].  The patient was noted on admission to have  contractions every two minutes, no vaginal bleeding with the exception of  some spotting.  Membranes were intact and fetal activity was excellent.  The  patient was admitted for [place] superimposed on chronic hypertension with plans  for Cytotec induction.  The patient's induction was begun on March 14, 2003  with Cytotec and was unremarkable with the exception of slow cervical  response to ripening.  The patient was also noted on March 15, 2003 to be  passing some clots which were felt secondary to partial abruption.  The  patient's blood pressure remained stable and baby did well with the  exception of a seven minute deceleration noted March 16, 2003 at  approximately [place].  On March 16, 2003 patient's cervix was noted to be 4 cm  dilated, 75%, and -2.  Pitocin was increased.  The patient delivered on  March 16, 2003 at approximately 9:15 a.m.  NSVD with Apgars of 7 and 9 at  one and five minutes under epidural anesthesia in routine fashion.  Second  degree midline laceration was repaired with 3-0 Vicryl with an estimated  blood loss of 300 mL.  The patient and infant were in stable condition.  That same day patient had bilateral tubal ligation under epidural anesthesia  and did well.   March 19, 2003 patient is postpartum and postoperative day number three,  doing well with complaint only of mild insomnia and of appropriate  incisional tenderness.  The patient's blood pressures have been stable and  is 100/60 on the day of discharge.   PLANS FOR DISCHARGE:  The patient to be discharged home.  Follow up at  [facility] in six weeks.   DISCHARGE MEDICATIONS:  1. Lortab 5/500 to be taken one p.o. q.4h. p.r.n. pain.  2. Ambien 5 mg one p.o. q.h.s. p.r.n. number 20.  3.  Atenolol 50 mg p.o. daily as prior to pregnancy.   DISCHARGE ACTIVITY:  Regular.  Nothing per vagina for six weeks.   PLANS FOR FOLLOWUP:  The patient to follow up as above at [facility] in six weeks.  She is to call for follow-up.     Obstetrics Resident                       Franchot Heidelberg. Kalman Shan, M.D.    OR/MEDQ  D:  03/19/2003  T:  03/19/2003  Job:  [id number]

## 2011-05-15 NOTE — Group Therapy Note (Signed)
NAME:  Tristan Schroeder NO.:  [id number]   MEDICAL RECORD NO.:  [id number]                   PATIENT TYPE:  OUT   LOCATION:  [facility]                           FACILITY:  [facility]   PHYSICIAN:  Darron Doom, MD                     DATE OF BIRTH:  July 25, 1961   DATE OF SERVICE:  08/07/2004                                    CLINIC NOTE   CHIEF COMPLAINT:  Follow-up.   HISTORY OF PRESENT ILLNESS:  The patient is a 50 year old who is status post  tubal ligation last year who ended up pregnant again.  She has chronic  medical problems and received methotrexate followed by Cytotec over the  weekend.  She reports that she had heavy bleeding on Sunday but now it has  decreased; sometimes it is heavier, sometimes it is lighter, but sort of  like her period.  The patient states that she is having no significant pain.  She denies fevers, chills.   PHYSICAL EXAMINATION TODAY:  Her vital signs are as noted on the chart.  She  has had a temperature of 100.6.  Her abdomen is soft, nontender,  nondistended.   IMPRESSION:  Status post abortion.   PLAN:  Will follow her up in 2 more weeks with abstinence.  She will receive  Depo at that time.  She wants to continue this for a year and then try to  talk her husband into getting a vasectomy.  She is to return with heavy  bleeding or fevers and chills.                                               Darron Doom, MD    TP/MEDQ  D:  08/07/2004  T:  08/07/2004  Job:  [id number]

## 2011-10-30 ENCOUNTER — Other Ambulatory Visit (HOSPITAL_COMMUNITY): Payer: Self-pay | Admitting: Nephrology

## 2011-10-30 DIAGNOSIS — N186 End stage renal disease: Secondary | ICD-10-CM

## 2011-11-06 ENCOUNTER — Ambulatory Visit (HOSPITAL_COMMUNITY)
Admission: RE | Admit: 2011-11-06 | Discharge: 2011-11-06 | Disposition: A | Discharge status: Home / Self Care | Payer: Self-pay | Source: Ambulatory Visit | Attending: Nephrology | Admitting: Nephrology

## 2011-11-06 DIAGNOSIS — N186 End stage renal disease: Secondary | ICD-10-CM | POA: Insufficient documentation

## 2011-11-06 DIAGNOSIS — Z992 Dependence on renal dialysis: Secondary | ICD-10-CM | POA: Insufficient documentation

## 2011-11-06 MED ORDER — IOHEXOL 300 MG/ML  SOLN
100.0000 mL | Freq: Once | INTRAMUSCULAR | Status: AC | PRN
  Administered 2011-11-06: 24 mL via INTRAVENOUS

## 2011-11-06 NOTE — Procedures (Signed)
Negative left arm fistulogram.  No intervention.  See radiology report.

## 2011-11-06 NOTE — Discharge Instructions (Signed)
Arteriovenous (AV) Access for Hemodialysis An arteriovenous (AV) access is a surgically created or placed tube that allows for repeated access to the blood in your body. This access is required for hemodialysis, a type of dialysis. Dialysis is a treatment process that filters and cleans the blood in order to eliminate toxic wastes from the body when the kidneys fail to do this on their own. There are several different access methods used for hemodialysis. ACCESS METHODS  Double lumen catheter. A flexible tube with 2 channels may be used on a temporary or long-term basis. A long-term use catheter often has a cuff that holds it in place. The catheter is surgically placed and tunneled under the skin. This catheter is often placed when dialysis is needed in an emergency, such as when the kidneys suddenly stop working. It may also be needed when a permanent AV access fails or has not yet been placed. A catheter is usually placed into one of the following:   Large vein under your collarbone (subclavian vein).   Large vein in your neck (jugular vein).   Temporarily, in the large vein in your groin (femoral vein).   AV graft. A man-made (synthetic) material may be used to connect an artery and vein in the arm or thigh. This graft takes around 2 weeks to develop (mature) and is often placed a few weeks prior to use. A graft can generally last from 1 to 2 years.   AV fistula. A minor surgical procedure may be done to connect an artery and vein, creating a fistula. This causes arterial blood to flow directly into a vein. The vein gets larger, allowing easier access for dialysis. The fistula takes around 12 weeks to mature and must be placed several months before dialysis is anticipated. A fistula provides the best access for hemodialysis and can last for several years.  It is critical that veins in patients at high risk to develop kidney failure are preserved. This may include avoiding blood pressure checks and  intravenous (IV) or lab draws from the arm. This maximizes the chance for creating a functioning AV access when needed. Although a fistula is the most desirable access to use for hemodialysis, it may not be possible. If the veins are not large enough or there is no time to wait for a fistula to mature, a graft or catheter may be used. RISKS AND COMPLICATIONS   Double lumen catheter. A catheter may develop serious infections. It may also develop a clot (thrombosis) and fail. A catheter can cause clots in the vein in which it is placed. A subclavian vein catheter is the most likely to cause thrombosis. When the subclavian vein clots, it makes it very difficult for the patient to sustain an AV graft or fistula. When possible, catheters should be avoided and a more permanent AV access should be placed.   AV graft. A graft may swell after surgery, but this should decrease as it heals. A graft may stop working properly due to thrombosis or the diameter of the tube getting smaller. The tube can eventually become blocked (stenosis). If a partial blockage is found relatively early, it can be treated. Left untreated, the stenosis will progress until the vessel is completely blocked. Infection may also occur.   AV fistula. Infection and thrombosis are the biggest risks with a fistula. However, the rates for infection and stenosis are lower with fistulas than the other 2 methods. Frequent thrombosis may require creating a backup fistula at another site.  This will allow for dialysis when one access is blocked.  HOME CARE INSTRUCTIONS   Keep the site of the cut (incision) clean and dry while it heals. This helps prevent infection.   If you have a catheter, do not shower while the incision is healing. After it is healed, ask your caregiver for recommendations on showering. The bandage (dressing) will be changed at the dialysis center. Do not remove this dressing. However, if it becomes wet or loose, a sterile gauze  dressing may be placed over the site and secured by placing adhesive tape on the edges.   If you have a graft or fistula, clean the site daily until it heals completely. Stitches will be removed, usually after about 10 to 14 days. Once the access is being used for dialysis, a small dressing will be placed over the needle sites after the treatment is done. Keep this dressing on for at least 12 hours. Keep your arm or thigh clean and dry during this time.   A small amount of bleeding is normal, especially if the access is new. If you have a large amount of bleeding and cannot stop it, this is not normal. Call your caregiver right away. You will be taught how to hold your graft or fistula sites to stop the bleeding.  If you have a graft or fistula:  A "bruit" is a noise that is heard with a stethoscope and a "thrill" is a vibration felt over the graft or fistula. The presence of the bruit and thrill indicate that the access is working. You will be taught to feel for the thrill each day. If this is not felt, the access may be clotted. Call your caregiver.   You may use the arm freely after the site heals. Keep the following in mind:   Avoid pressure on the arm.   Avoid lifting heavy objects with the arm.   Avoid sleeping on the arm with the graft or fistula.   Avoid wearing tight-sleeved shirts or jewelry around the graft or fistula.   Do not allow blood pressure monitoring or needle punctures on the side where the graft or fistula is located.   With permission from your caregiver, you may do exercises to help with blood flow through a fistula. These exercises involve squeezing a rubber ball or other soft objects as instructed.  SEEK MEDICAL CARE IF:   Chills develop.   You have an oral temperature above 102 F (38.9 C).   Swelling around the graft or fistula gets worse.   New pain develops.   Unusual bleeding develops.   Pus or other fluid (drainage) is seen at the AV access site.    Skin redness or red streaking is seen on the skin around, above, or below the AV access.  SEEK IMMEDIATE MEDICAL CARE IF:   Pain, numbness, or an unusual pale skin color develops in the hand on the side of your fistula.   Dizziness or weakness develops that you have not had before.   Shortness of breath develops.   Chest pain develops.   The AV access has bleeding that cannot be easily controlled.  Wear a medical alert bracelet to let caregivers know you are a dialysis patient, so they can care for your veins appropriately. Document Released: 03/06/2003 Document Revised: 08/26/2011 Document Reviewed: 05/13/2010 [facility] Patient Information 2012 [place].

## 2011-11-27 ENCOUNTER — Other Ambulatory Visit: Payer: Self-pay | Admitting: Family Medicine

## 2011-11-27 DIAGNOSIS — N644 Mastodynia: Secondary | ICD-10-CM

## 2011-12-11 ENCOUNTER — Other Ambulatory Visit: Payer: Self-pay | Admitting: Family Medicine

## 2011-12-11 ENCOUNTER — Ambulatory Visit
Admission: RE | Admit: 2011-12-11 | Discharge: 2011-12-11 | Disposition: A | Discharge status: Home / Self Care | Payer: No Typology Code available for payment source | Source: Ambulatory Visit | Attending: Family Medicine | Admitting: Family Medicine

## 2011-12-11 DIAGNOSIS — N644 Mastodynia: Secondary | ICD-10-CM

## 2011-12-11 NOTE — Progress Notes (Signed)
Interpreter Dontae Minerva Namihira  10.45 for mammogram but not for ultrasound.

## 2012-01-23 DIAGNOSIS — D509 Iron deficiency anemia, unspecified: Secondary | ICD-10-CM | POA: Insufficient documentation

## 2012-02-18 ENCOUNTER — Emergency Department (HOSPITAL_COMMUNITY): Payer: Self-pay

## 2012-02-18 ENCOUNTER — Emergency Department (HOSPITAL_COMMUNITY)
Admission: EM | Admit: 2012-02-18 | Discharge: 2012-02-18 | Disposition: A | Discharge status: Home / Self Care | Payer: Self-pay | Attending: Emergency Medicine | Admitting: Emergency Medicine

## 2012-02-18 ENCOUNTER — Encounter (HOSPITAL_COMMUNITY): Payer: Self-pay | Admitting: *Deleted

## 2012-02-18 DIAGNOSIS — R748 Abnormal levels of other serum enzymes: Secondary | ICD-10-CM | POA: Insufficient documentation

## 2012-02-18 DIAGNOSIS — R945 Abnormal results of liver function studies: Secondary | ICD-10-CM

## 2012-02-18 DIAGNOSIS — R7989 Other specified abnormal findings of blood chemistry: Secondary | ICD-10-CM | POA: Insufficient documentation

## 2012-02-18 DIAGNOSIS — Z79899 Other long term (current) drug therapy: Secondary | ICD-10-CM | POA: Insufficient documentation

## 2012-02-18 DIAGNOSIS — R109 Unspecified abdominal pain: Secondary | ICD-10-CM | POA: Insufficient documentation

## 2012-02-18 DIAGNOSIS — I12 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 disease: Secondary | ICD-10-CM | POA: Insufficient documentation

## 2012-02-18 DIAGNOSIS — R102 Pelvic and perineal pain: Secondary | ICD-10-CM

## 2012-02-18 DIAGNOSIS — Z992 Dependence on renal dialysis: Secondary | ICD-10-CM

## 2012-02-18 DIAGNOSIS — E669 Obesity, unspecified: Secondary | ICD-10-CM | POA: Insufficient documentation

## 2012-02-18 DIAGNOSIS — N186 End stage renal disease: Secondary | ICD-10-CM | POA: Insufficient documentation

## 2012-02-18 HISTORY — DX: Essential (primary) hypertension: I10

## 2012-02-18 HISTORY — DX: End stage renal disease: N18.6

## 2012-02-18 LAB — URINALYSIS, ROUTINE W REFLEX MICROSCOPIC
Bilirubin Urine: NEGATIVE
Ketones, ur: NEGATIVE mg/dL
Protein, ur: 100 mg/dL — AB
Urobilinogen, UA: 0.2 mg/dL (ref 0.0–1.0)

## 2012-02-18 LAB — CBC
HCT: 34.9 % — ABNORMAL LOW (ref 36.0–46.0)
Hemoglobin: 11.9 g/dL — ABNORMAL LOW (ref 12.0–15.0)
WBC: 6.1 10*3/uL (ref 4.0–10.5)

## 2012-02-18 LAB — LACTIC ACID, PLASMA: Lactic Acid, Venous: 1.3 mmol/L (ref 0.5–2.2)

## 2012-02-18 LAB — DIFFERENTIAL
Basophils Absolute: 0 10*3/uL (ref 0.0–0.1)
Lymphocytes Relative: 16 % (ref 12–46)
Monocytes Absolute: 0.4 10*3/uL (ref 0.1–1.0)
Monocytes Relative: 6 % (ref 3–12)
Neutro Abs: 4.6 10*3/uL (ref 1.7–7.7)

## 2012-02-18 LAB — LIPASE, BLOOD: Lipase: 73 U/L — ABNORMAL HIGH (ref 11–59)

## 2012-02-18 LAB — COMPREHENSIVE METABOLIC PANEL
AST: 47 U/L — ABNORMAL HIGH (ref 0–37)
Alkaline Phosphatase: 66 U/L (ref 39–117)
CO2: 27 mEq/L (ref 19–32)
Chloride: 99 mEq/L (ref 96–112)
Creatinine, Ser: 3.87 mg/dL — ABNORMAL HIGH (ref 0.50–1.10)
GFR calc non Af Amer: 13 mL/min — ABNORMAL LOW (ref >90)
Total Bilirubin: 0.3 mg/dL (ref 0.3–1.2)

## 2012-02-18 LAB — MAGNESIUM: Magnesium: 2.2 mg/dL (ref 1.5–2.5)

## 2012-02-18 LAB — URINE MICROSCOPIC-ADD ON

## 2012-02-18 MED ORDER — ONDANSETRON HCL 4 MG/2ML IJ SOLN
4.0000 mg | Freq: Once | INTRAMUSCULAR | Status: AC
  Administered 2012-02-18: 4 mg via INTRAVENOUS
  Filled 2012-02-18: qty 2

## 2012-02-18 MED ORDER — MORPHINE SULFATE 4 MG/ML IJ SOLN
4.0000 mg | Freq: Once | INTRAMUSCULAR | Status: AC
  Administered 2012-02-18: 4 mg via INTRAVENOUS
  Filled 2012-02-18: qty 1

## 2012-02-18 NOTE — ED Notes (Signed)
Translation line called  Translator [id number] called.  Discharge inst given

## 2012-02-18 NOTE — ED Notes (Signed)
Patient was at dialysis and had completed half her treatment and started c/o abd. Pain states she ate a sandwich , chips, and some candy and her stomach started hurting. Vomited x1. Patient has her shunt in her left arm.

## 2012-02-18 NOTE — ED Provider Notes (Signed)
History     CSN: [id number]  Arrival date & time 02/18/12  1459   First MD Initiated Contact with Patient 02/18/12 1503      Chief Complaint  Patient presents with  . Abdominal Pain    (Consider location/radiation/quality/duration/timing/severity/associated sxs/prior treatment) HPI History provided by the patient.  History mildly limited secondary to language barrier.  51 year old female with history of end-stage renal disease on hemodialysis and hypertension presenting with complaint of abdominal pain.  Patient states her pain is in her suprapubic area and began acutely around 220 pm today (about one hour prior to ED arrival).  Pain is described as severe; however pt cannot describe the character.  Patient states she started dialysis around 12 noon today, ate Doritos, steroids, and some chocolate, and begin having pain soon after completion of her meal.  Patient denies radiation. Pain is associated with nausea and vomiting times one without hematemesis, hematochezia, diarrhea, urinary symptoms, vaginal discharge, or vaginal bleeding. Patient does have a history of a tubal ligation but no other abdominal surgeries.  Patient did take a Tylenol prior to ED arrival and reports mild improvement of her pain. No appreciated worsening factors.   Patient describes one prior episode in the past, without known diagnosis, but improving after Tylenol. Patient has not been seen specifically with current complaint by a different physician.  Patient did complete dialysis 2 hours prior to scheduled completion.   Past Medical History  Diagnosis Date  . Renal disorder   . Hypertension   . End stage renal disease     Past Surgical History  Procedure Date  . Av fistula placement   . Tubal ligation     History reviewed. No pertinent family history.  History  Substance Use Topics  . Smoking status: Never Smoker   . Smokeless tobacco: Not on file  . Alcohol Use: No    OB History    Grav Para  Term Preterm Abortions TAB SAB Ect Mult Living                  Review of Systems  Constitutional: Negative for fever and chills.  HENT: Negative for congestion, sore throat and rhinorrhea.   Eyes: Negative for pain and visual disturbance.  Respiratory: Negative for cough, shortness of breath and wheezing.   Cardiovascular: Negative for chest pain and palpitations.  Gastrointestinal: Positive for nausea, vomiting and abdominal pain. Negative for diarrhea and blood in stool.  Genitourinary: Negative for dysuria and hematuria.       LMP reportedly ~1 yr ago and pt with h/o tubal ligation.  Musculoskeletal: Negative for back pain and gait problem.  Skin: Negative for rash and wound.  Neurological: Negative for dizziness and headaches.  Psychiatric/Behavioral: Negative for confusion and agitation.  All other systems reviewed and are negative.    Allergies  Review of patient's allergies indicates no known allergies.  Home Medications   Current Outpatient Rx  Name Route Sig Dispense Refill  . ACETAMINOPHEN 500 MG PO TABS Oral Take 1,000 mg by mouth every 6 (six) hours as needed. For pain    . AMLODIPINE BESYLATE 5 MG PO TABS Oral Take 5 mg by mouth daily.    Marland Kitchen CALCIUM CARBONATE ANTACID 750 MG PO CHEW Oral Chew 1-2 tablets by mouth See admin instructions. 1 tablet with each meal at home, 2 tablets with a meal on dialysis days    . CARVEDILOL 25 MG PO TABS Oral Take 25 mg by mouth at bedtime.    Marland Kitchen  RENA-VITE PO TABS Oral Take 1 tablet by mouth daily.    Marland Kitchen SEVELAMER CARBONATE 800 MG PO TABS Oral Take 800 mg by mouth 3 (three) times daily with meals.    Marland Kitchen TEMAZEPAM 15 MG PO CAPS Oral Take 15-30 mg by mouth at bedtime.      BP 152/89  Pulse 74  Temp(Src) 97.6 F (36.4 C) (Oral)  Resp 20  SpO2 100%  Physical Exam  Nursing note and vitals reviewed. Constitutional: She is oriented to person, place, and time.       Mildly overweight, anxious and appears uncomfortable with occasional  tearing, alert, mostly Spanish speaking  HENT:  Head: Normocephalic and atraumatic.  Right Ear: External ear normal.  Left Ear: External ear normal.  Nose: Nose normal.  Mouth/Throat: Oropharynx is clear and moist.  Eyes: Conjunctivae and EOM are normal. Pupils are equal, round, and reactive to light.  Neck: Normal range of motion. Neck supple.  Cardiovascular: Normal rate, regular rhythm and intact distal pulses.   No murmur heard. Pulmonary/Chest: Effort normal and breath sounds normal. No respiratory distress. She has no wheezes. She has no rales. She exhibits no tenderness.  Abdominal: Soft. Bowel sounds are normal. She exhibits no distension. There is tenderness (mild in the suprapubic area; no sig upper abd TTP or lateralizing tenderness.). There is no rebound.       Mildly obese  Musculoskeletal: Normal range of motion. She exhibits no edema.       Distal Lt forearm with fistula with thrill.  Neurological: She is alert and oriented to person, place, and time.  Skin: Skin is warm and dry. No rash noted. She is not diaphoretic.  Psychiatric: Judgment normal. Her mood appears anxious.    ED Course  Procedures (including critical care time)  Labs Reviewed  CBC - Abnormal; Notable for the following:    Hemoglobin 11.9 (*)    HCT 34.9 (*)    All other components within normal limits  COMPREHENSIVE METABOLIC PANEL - Abnormal; Notable for the following:    Glucose, Bld 136 (*)    Creatinine, Ser 3.87 (*)    AST 47 (*) HEMOLYSIS AT THIS LEVEL MAY AFFECT RESULT   GFR calc non Af Amer 13 (*)    GFR calc Af Amer 15 (*)    All other components within normal limits  URINALYSIS, ROUTINE W REFLEX MICROSCOPIC - Abnormal; Notable for the following:    pH 8.5 (*)    Glucose, UA 100 (*)    Hgb urine dipstick TRACE (*)    Protein, ur 100 (*)    All other components within normal limits  WET PREP, GENITAL - Abnormal; Notable for the following:    WBC, Wet Prep HPF POC MODERATE (*)    All  other components within normal limits  LIPASE, BLOOD - Abnormal; Notable for the following:    Lipase 73 (*)    All other components within normal limits  URINE MICROSCOPIC-ADD ON - Abnormal; Notable for the following:    Squamous Epithelial / LPF FEW (*)    All other components within normal limits  DIFFERENTIAL  LACTIC ACID, PLASMA  MAGNESIUM  URINE CULTURE  GC/CHLAMYDIA PROBE AMP, GENITAL   Ct Abdomen Pelvis Wo Contrast  02/18/2012  *RADIOLOGY REPORT*  Clinical Data: Abdominal pain and vomiting.  CT ABDOMEN AND PELVIS WITHOUT CONTRAST  Technique:  Multidetector CT imaging of the abdomen and pelvis was performed following the standard protocol without intravenous contrast.  Comparison: None  Findings: The lung bases are clear except for minimal dependent atelectasis and vascular crowding.  No pleural effusion nor pulmonary nodule.  The liver is unremarkable.  No focal lesions or intrahepatic biliary dilatation.  Gallbladder is normal.  No common bile duct dilatation.  Pancreas appears normal.  The spleen is normal in size.  No focal lesions.  The adrenal glands are normal.  The kidneys are small consistent with known renal failure.  No worrisome renal mass or renal calculi.  The stomach, duodenum, small bowel and colon are grossly normal without oral contrast.  The appendix is normal.  The uterus and ovaries are unremarkable.  No mesenteric or retroperitoneal masses or adenopathy.  The aorta is normal in caliber.  The bladder is normal.  No pelvic mass, adenopathy or significant free pelvic fluid collection.  There is a small amount of likely physiologic free fluid.  No inguinal mass or hernia.  The bony structures are unremarkable.  Mild degenerative changes involving the facet joints, SI joints and pubic symphysis.  IMPRESSION: No acute abdominal/pelvic findings.  Original Report Authenticated By: P. Kalman Jewels, M.D.     1. Suprapubic pain, acute   2. End stage renal disease on dialysis     3. Elevated lipase   4. Abnormal liver function test       MDM  51 y.o. F with ESRD on HD presenting with acute suprapubic pain with N/V occuring soon after eating during dialysis; HD terminated early.  No other complaints.  Exam as above, AF/VSS other than mild HTN, in pain, mild suprapubic TTP without peritoneal Sn's.  Ureterolithiasis or UTI possible.  Clinical picture less likely appy, ovarian torsion, PID, GB path, diverticulitis, pancreatitis, or ischemic path.  Labs, morphine, zofran, and CT ordered.  Abd pain completely resolved soon after initial exam.  Labs with mildly elevated lipase and AST but NL tbili and other LFTs; denies EtOH abuse; pt has not pain or TTP in the upper abd and neg murphy's.  CT without Sn of GB, liver, or pancreas issues.  No indication for RUQ u/s at this time.  UA and wet prep without Sn of infection.  Repeat abd improved.  Will have pt f/u with her PCP.  Strict return precautions reviewed.     Chana Bode, MD 02/19/12 0157

## 2012-02-18 NOTE — Discharge Instructions (Signed)
Dolor abdominal, versin ampliada (Abdominal Pain, Nonspecific) El anlisis podra no mostrar la razn exacta por la que tiene dolor abdominal. Debido a que hay muchas causas distintas de dolor abdominal, se podr necesitar otro control y ms anlisis. Es muy importante el seguimiento para observar los sntomas duraderos (persistentes) o los que [place]. Una causa posible de dolor abdominal en cualquier persona que an tiene su apndice es la apendicitis aguda. La apendicitis es a menudo difcil de diagnosticar. Los anlisis de [place], [place], [place] y tomografa computada no pueden descartar por completo la apendicitis u otra causas de dolor abdominal. A veces, slo los cambios que se producen a Designer, jewellery del Engineer, drilling si el dolor abdominal se debe al apendicitis o a otras causas. Otros problemas potenciales que pueden requerir [place] tambin pueden tomar algn [employer] ser evidentes. Debido a esto, es importante seguir todas las instrucciones de ms abajo. INSTRUCCIONES PARA EL CUIDADO DOMICILIARIO  Descanse todo lo que pueda.   No ingiera alimentos slidos hasta que el dolor desaparezca.   Cuando un adulto o un nio siente dolor: Puede beneficiarlo una dieta basada en agua, t liviano descafeinado, caldo o consom, gelatina, solucin de rehidratacin oral, helados de agua o trocitos de hielo.   Cuando el adulto o el nio no sienten ms dolor: Consuma una dieta liviana (tostadas secas, crackers, jugo de [place] o arroz blanco). Incorpore ms alimentos lentamente, siempre que esto no le cause ningn trastorno. No consuma productos lcteos (incluyendo queso y [place]) ni ingiera alimentos condimentados, grasos, fritos o con gran cantidad de [place].   No consuma alcohol, cafena ni cigarrillos.   Tome sus medicamentos regularmente, excepto que el profesional le indique lo contrario.   Utilice los medicamentos de venta libre o de prescripcin para Conservation officer, historic buildings, Health and safety inspector o la  [place], segn se lo indique el profesional que lo asiste.   Utilice los medicamentos de venta libre o de prescripcin para Conservation officer, historic buildings, Health and safety inspector o la [place], segn se lo indique el profesional que lo asiste. No administre aspirina a los nios.  Si el mdico le ha dado fecha para una visita de control, es importante que concurra. No cumplir con este control puede dar como resultado que el dao, el dolor o la discapacidad sean permanentes (crnicos). Si tiene problemas para asistir al control, deber [employer] establecimiento para recibir asesoramiento.  SOLICITE ATENCIN MDICA DE INMEDIATO SI:  Usted o su nio han sufrido dolor por ms de 24 horas.   El dolor Continental, cambia de Environmental consultant o se siente diferente.   Usted o su nio tienen una temperatura oral de ms de 102 F (38.9 C) y no puede ser controlada con medicamentos.   Su beb tiene ms de 3 meses y su temperatura rectal es de 102 F (38.9 C) o ms.   Su beb tiene 3 meses o menos y su temperatura rectal es de 100.4 F (38 C) o ms.   Usted o su hijo tienen escalofros.   Continan con vmitos y no pueden retener lquidos.   Observa sangre en el vmito o en la materia fecal.   Las heces son oscuras o [place].   Los movimientos intestinales son frecuentes.   Los movimientos intestinales se detienen (hay una obstruccin) o no pueden eliminarse los gases.   Siente dolor al [employer] o lo hace con frecuencia u observa sangre en la orina.   La piel y la zona blanca de los ojos [place] de color y se tornan amarillos.  Observa que el estmago se hincha o est ms grande.   Sienten mareos o [employer].   Sienten dolor en el pecho o la espalda.  EST SEGURO QUE:   Comprende las instrucciones para el alta mdica.   Controlar su enfermedad.   Solicitar atencin mdica de inmediato segn las indicaciones.  Document Released: 03/22/2008 Document Revised: 08/26/2011 [facility] Patient Information 2012 [place].

## 2012-02-18 NOTE — ED Notes (Signed)
Pt taken to CDU 1 for pelvic exam

## 2012-02-19 LAB — GC/CHLAMYDIA PROBE AMP, GENITAL: Chlamydia, DNA Probe: NEGATIVE

## 2012-02-19 NOTE — ED Provider Notes (Signed)
I saw and evaluated the patient, reviewed the resident's note and I agree with the findings and plan.  Suprapubic abdominal pain onset after eating and during dialysis. No radiation or associated symptoms.  Abdomen soft, TTP.    Ezequiel Essex, MD 02/19/12 1113

## 2012-02-21 LAB — URINE CULTURE: Colony Count: 45000

## 2012-02-22 ENCOUNTER — Emergency Department (HOSPITAL_COMMUNITY)
Admission: EM | Admit: 2012-02-22 | Discharge: 2012-02-23 | Disposition: A | Discharge status: Home / Self Care | Payer: Self-pay | Attending: Emergency Medicine | Admitting: Emergency Medicine

## 2012-02-22 DIAGNOSIS — K5900 Constipation, unspecified: Secondary | ICD-10-CM | POA: Insufficient documentation

## 2012-02-22 DIAGNOSIS — I12 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 disease: Secondary | ICD-10-CM | POA: Insufficient documentation

## 2012-02-22 DIAGNOSIS — R110 Nausea: Secondary | ICD-10-CM | POA: Insufficient documentation

## 2012-02-22 DIAGNOSIS — Z79899 Other long term (current) drug therapy: Secondary | ICD-10-CM | POA: Insufficient documentation

## 2012-02-22 DIAGNOSIS — R109 Unspecified abdominal pain: Secondary | ICD-10-CM | POA: Insufficient documentation

## 2012-02-22 DIAGNOSIS — N186 End stage renal disease: Secondary | ICD-10-CM | POA: Insufficient documentation

## 2012-02-22 MED ORDER — BISACODYL 10 MG RE SUPP
10.0000 mg | Freq: Once | RECTAL | Status: AC
  Administered 2012-02-22: 10 mg via RECTAL
  Filled 2012-02-22: qty 1

## 2012-02-22 NOTE — ED Notes (Signed)
Pt up to bathroom st's still unable to have BM. St's she tried to digitally remove stool but was unable to do so.

## 2012-02-22 NOTE — ED Notes (Signed)
Supp. Given, pt instructed to use bathroom when able.

## 2012-02-22 NOTE — ED Provider Notes (Signed)
History     CSN: [id number]  Arrival date & time 02/22/12  1908   First MD Initiated Contact with Patient 02/22/12 2145      Chief Complaint  Patient presents with  . Abdominal Pain    (Consider location/radiation/quality/duration/timing/severity/associated sxs/prior treatment) HPI Comments: Patient has been unable to have bowel movement since last Thursday.  Today.  She took a bottle of mag citrate and has had no results.  She states that she can feel the stool at the anus, but is unable to push it out  Patient is a 51 y.o. female presenting with abdominal pain. The history is provided by the patient.  Abdominal Pain The primary symptoms of the illness include abdominal pain and nausea. The primary symptoms of the illness do not include fever, vomiting or diarrhea. The current episode started more than 2 days ago. The problem has been gradually worsening.  Additional symptoms associated with the illness include constipation. Symptoms associated with the illness do not include chills.    Past Medical History  Diagnosis Date  . Renal disorder   . Hypertension   . End stage renal disease     Past Surgical History  Procedure Date  . Av fistula placement   . Tubal ligation     No family history on file.  History  Substance Use Topics  . Smoking status: Never Smoker   . Smokeless tobacco: Not on file  . Alcohol Use: No    OB History    Grav Para Term Preterm Abortions TAB SAB Ect Mult Living                  Review of Systems  Constitutional: Negative for fever and chills.  Gastrointestinal: Positive for nausea, abdominal pain and constipation. Negative for vomiting and diarrhea.    Allergies  Review of patient's allergies indicates no known allergies.  Home Medications   Current Outpatient Rx  Name Route Sig Dispense Refill  . ACETAMINOPHEN 500 MG PO TABS Oral Take 1,000 mg by mouth every 6 (six) hours as needed. For pain    . ALPRAZOLAM 0.25 MG PO TABS  Oral Take 0.125-0.25 mg by mouth 2 (two) times daily as needed.    Marland Kitchen AMLODIPINE BESYLATE 5 MG PO TABS Oral Take 5 mg by mouth daily.    Marland Kitchen CALCIUM CARBONATE ANTACID 750 MG PO CHEW Oral Chew 1-2 tablets by mouth See admin instructions. 1 tablet with each meal at home, 2 tablets with a meal on dialysis days    . CARVEDILOL 25 MG PO TABS Oral Take 12.5 mg by mouth at bedtime.     Marland Kitchen RENA-VITE PO TABS Oral Take 1 tablet by mouth daily.    Marland Kitchen SEVELAMER CARBONATE 800 MG PO TABS Oral Take 800 mg by mouth 3 (three) times daily with meals.    Marland Kitchen TEMAZEPAM 15 MG PO CAPS Oral Take 15-30 mg by mouth at bedtime as needed. For sleep      BP 156/87  Pulse 108  Temp(Src) 98.1 F (36.7 C) (Oral)  Resp 20  SpO2 97%  LMP 02/21/2011  Physical Exam  Constitutional: She appears well-developed and well-nourished.  Eyes: Pupils are equal, round, and reactive to light.  Cardiovascular: Normal rate.   Pulmonary/Chest: Effort normal.  Abdominal: Soft.  Genitourinary: Rectum normal. Rectal exam shows no external hemorrhoid, no fissure and anal tone normal.       Sore formed stool in the rectal vault    ED Course  Procedures (  including critical care time)  Labs Reviewed - No data to display No results found.   1. Constipation       MDM  Constipation, Duphalac suppository in the emergency department, and reassess.  Digital exam showed soft, formed stool in the rectum  After suppository.  Patient still unable to pass stool, approximately 6 ounces of soft, formed stool removed digitally.  Will now try 6 soap suds enema  After soapsuds enema.  The patient had a large bowel movement and feels better    Garald Balding, NP 02/22/12 2346  Garald Balding, NP 02/23/12 0119  Garald Balding, NP 02/23/12 0120  Medical screening examination/treatment/procedure(s) were performed by non-physician practitioner and as supervising physician I was immediately available for consultation/collaboration.   Sharyon Cable, MD 02/23/12 1116

## 2012-02-22 NOTE — ED Notes (Signed)
Unable to have a bm since Friday.  C/o pain.  nopast medical history fo the same some nausea

## 2012-02-22 NOTE — ED Notes (Signed)
+   urine Culture.Chart sent to [place] office for review.

## 2012-02-23 NOTE — Discharge Instructions (Signed)
Constipacin en los adultos (Constipation in Adults) Se denomina constipacin al hecho de mover el intestino menos de dos veces por semana. Generalmente las heces son duras. A medida que envejecemos, la constipacin es ms frecuente. Si trata de solucionarlo con laxantes, puede empeorar el problema. Los laxantes utilizados durante largos perodos pueden [employer] msculos del colon. Esto har que la constipacin empeore gradualmente. Una dieta pobre en fibras, la ingesta insuficiente de lquidos y algunos medicamentos pueden empeorar el problema. ALGUNOS MEDICAMENTOS QUE PUEDEN CAUSAR CONSTIPACIN SON:  Diurticos.   Boqueadotes de los canales de calcio (medicamento utilizado para Chief Technology Officer la presin arterial y el funcionamiento cardaco.   Narcticos (ciertos medicamentos para Conservation officer, historic buildings).   Anticolinrgicos.   Antiinflamatorios.   Anticidos que contengan aluminio.  ALGUNOS MEDICAMENTOS QUE PUEDEN CAUSAR CONSTIPACIN SON:   Diabetes.   Enfermedad de Parkinson.   Demencia (la mente no funciona adecuadamente y existen trastornos del pensamiento).   Ictus.   Depresin.   Otras enfermedades que causan dificultades con el metabolismo de sales y lquidos.  INSTRUCCIONES PARA EL CUIDADO DOMICILIARIO  El mejor tratamiento para la constipacin es el que se realiza sin tomar medicamentos. Es importante consumir ms fibras, frutas y Photographer.   Aumente lentamente el consumo de fibras a 25 a 56 gramos por da. Granos integrales, frutas, verduras y legumbres son buenas fuentes de [place]. Un dietista podr ayudarlo a incorporar alimentos altos en fibra en su dieta.   Beba gran cantidad de lquido para mantener la orina de tono claro o color amarillo plido.   Deber aadir un suplemento de fibra en su dieta si no puede recibir la cantidad suficiente a partir de los alimentos.   Aumentar la actividad fsica tambin ayuda a mejorar la regularidad.   Los supositorios, segn lo haya  indicado el mdico, podrn ser de utilidad. Si toma anticidos u otros productos que contengan aluminio o calcio, los que pueden causar constipacin, ser de gran ayuda cambiarlos por productos que contengan magnesio, con la aprobacin del mdico.   Si en el da de hoy le han aplicado un enema, considere que esta slo es una medida temporaria y no debe considerarse como tratamiento para la constipacin de Engineer, site data (crnica). Si utiliza enemas [employer], se debilitarn los msculos del colon y la Firefighter.   Tambin deben evitarse en lo posible medidas ms enrgicas, como el consumo de sulfato de magnesio, siempre que sea posible. El sulfato de magnesio puede causar diarrea incontrolable. Sin embargo, si usted es una persona de edad [place], estas medidas pueden ser tentadoras. En algunos casos, este tipo de medidas ni siquiera le dan tiempo para llegar al bao.  Reeder DE INMEDIATO SI:  Observa sangre de color rojo brillante en las heces.   El estreimiento persiste durante ms de [employer].   Presenta dolor abdominal o rectal junto con el estreimiento.   No parece sentirse mejor.   Tiene preguntas para formular, o alguna preocupacin, o no mejora.  EST SEGURO QUE:   Comprende las instrucciones para el alta mdica.   Controlar su enfermedad.   Solicitar atencin mdica de inmediato segn las indicaciones.  Document Released: 01/03/2008 Document Revised: 08/26/2011 [facility] Patient Information 2012 [place].

## 2012-02-24 NOTE — ED Notes (Signed)
+   Urine Rx for Macrobid 100 mg 1 po q 12 hours x 7 days #14 tabs written by Janean Sark.

## 2012-02-26 ENCOUNTER — Telehealth (HOSPITAL_COMMUNITY): Payer: Self-pay | Admitting: *Deleted

## 2012-02-26 NOTE — ED Notes (Signed)
Patient informed of positive results after id'd x 2 . Patient wants rx called to [facility] [phone]

## 2012-03-10 DIAGNOSIS — D631 Anemia in chronic kidney disease: Secondary | ICD-10-CM | POA: Insufficient documentation

## 2012-03-10 DIAGNOSIS — N2581 Secondary hyperparathyroidism of renal origin: Secondary | ICD-10-CM | POA: Insufficient documentation

## 2012-04-06 ENCOUNTER — Ambulatory Visit (INDEPENDENT_AMBULATORY_CARE_PROVIDER_SITE_OTHER): Payer: Self-pay | Admitting: Physician Assistant

## 2012-04-06 ENCOUNTER — Other Ambulatory Visit (HOSPITAL_COMMUNITY)
Admission: RE | Admit: 2012-04-06 | Discharge: 2012-04-06 | Disposition: A | Discharge status: Home / Self Care | Payer: Self-pay | Source: Ambulatory Visit | Attending: Advanced Practice Midwife | Admitting: Advanced Practice Midwife

## 2012-04-06 ENCOUNTER — Encounter: Payer: Self-pay | Admitting: Physician Assistant

## 2012-04-06 VITALS — BP 123/78 | HR 72 | Temp 98.3°F | Ht 59.0 in | Wt 146.3 lb

## 2012-04-06 DIAGNOSIS — Z124 Encounter for screening for malignant neoplasm of cervix: Secondary | ICD-10-CM

## 2012-04-06 DIAGNOSIS — N186 End stage renal disease: Secondary | ICD-10-CM

## 2012-04-06 DIAGNOSIS — Z992 Dependence on renal dialysis: Secondary | ICD-10-CM

## 2012-04-06 DIAGNOSIS — I10 Essential (primary) hypertension: Secondary | ICD-10-CM | POA: Insufficient documentation

## 2012-04-06 DIAGNOSIS — N950 Postmenopausal bleeding: Secondary | ICD-10-CM | POA: Insufficient documentation

## 2012-04-06 DIAGNOSIS — Z01812 Encounter for preprocedural laboratory examination: Secondary | ICD-10-CM

## 2012-04-06 NOTE — Patient Instructions (Signed)
Postmenopausal Bleeding Menopause is commonly referred to as the "change in life." It is a time when the fertile years, the time of ovulating and having menstrual periods, has come to an end. It is also determined by not having menstrual periods for 12 months.  Postmenopausal bleeding is any bleeding a woman has after she has entered into menopause. Any type of postmenopausal bleeding, even if it appears to be a typical menstrual period, is concerning. This should be evaluated by your caregiver.  CAUSES   Hormone therapy.   Cancer of the cervix or cancer of the lining of the uterus (endometrial cancer).   Thinning of the uterine lining (uterine atrophy).   Thyroid diseases.   Certain medicines.   Infection of the uterus or cervix.   Inflammation or irritation of the uterine lining (endometritis).   Estrogen-secreting tumors.   Growths (polyps) on the cervix, uterine lining, or uterus.   Uterine tumors (fibroids).   Being very overweight (obese).  DIAGNOSIS  Your caregiver will take a medical history and ask questions. A physical exam will also be performed. Further tests may include:   A transvaginal ultrasound. An ultrasound wand or probe is inserted into your vagina to view the pelvic organs.   A biopsy of the lining of the uterus (endometrium). A sample of the endometrium is removed and examined.   A hysteroscopy. Your caregiver may use an instrument with a light and a camera attached to it (hysteroscope). The hysteroscope is used to look inside the uterus for problems.   A dilation and curettage (D&C). Tissue is removed from the uterine lining to be examined for problems.  TREATMENT  Treatment depends on the cause of the bleeding. Some treatments include:   Surgery.   Medicines.   Hormones.   A hysteroscopy or D&C to remove polyps or fibroids.   Changing or stopping a current medicine you are taking.  Talk to your caregiver about your specific treatment. HOME CARE  INSTRUCTIONS   Maintain a healthy weight.   Keep regular pelvic exams and Pap tests.  SEEK MEDICAL CARE IF:   You have bleeding, even if it is light in comparison to your previous periods.   Your bleeding lasts more than 1 week.   You have abdominal pain.   You develop bleeding with sexual intercourse.  SEEK IMMEDIATE MEDICAL CARE IF:   You have a fever, chills, headache, dizziness, muscle aches, and bleeding.   You have severe pain with bleeding.   You are passing blood clots.   You have bleeding and need more than 1 pad an hour.   You feel faint.  MAKE SURE YOU:  Understand these instructions.   Will watch your condition.   Will get help right away if you are not doing well or get worse.  Document Released: 03/24/2006 Document Revised: 12/03/2011 Document Reviewed: 08/20/2011 [facility] Patient Information 2012 [place].

## 2012-04-06 NOTE — Progress Notes (Signed)
Chief Complaint:  abnormal vaginal bleeding   Sabrina Mitchell is  51 y.o. Pt referred from [facility] secondary to post-menopausal bleeding. Pt reports no period for 1-2 years until February, when she experienced 2 days of bleeding and lower abd pain. Denies any bleeding or pain today.  Obstetrical/Gynecological History: OB History    Grav Para Term Preterm Abortions TAB SAB Ect Mult Living                Last Pap: 2004 Normal Mammo: 04/2011 [facility]  Past Medical History: Past Medical History  Diagnosis Date  . Renal disorder   . Hypertension   . End stage renal disease     Past Surgical History: Past Surgical History  Procedure Date  . Av fistula placement   . Tubal ligation     Family History: Family History  Problem Relation Age of Onset  . Hypertension Mother   . Hypertension Father   . Hypertension Sister   . Diabetes Brother   . Hypertension Brother     Social History: History  Substance Use Topics  . Smoking status: Never Smoker   . Smokeless tobacco: Not on file  . Alcohol Use: No    Allergies: No Known Allergies  Review of Systems - Negative except what has been reviewed in the HPI  Physical Exam   Blood pressure 123/78, pulse 72, temperature 98.3 F (36.8 C), temperature source Oral, height 4\' 11"  (1.499 m), weight 146 lb 4.8 oz (66.361 kg), last menstrual period 02/11/2012.  General: General appearance - alert, well appearing, and in no distress, oriented to person, place, and time, anxious and poor historian Mental status - alert, oriented to person, place, and time, normal mood, behavior, speech, dress, motor activity, and thought processes, affect appropriate to mood Focused Gynecological Exam: VULVA: normal appearing vulva with no masses, tenderness or lesions, VAGINA: normal appearing vagina with normal color and discharge, no lesions, CERVIX: normal appearing cervix without discharge or lesions, UTERUS: uterus is normal size, shape,  consistency and nontender, ADNEXA: normal adnexa in size, nontender and no masses  Patient given informed consent, signed copy in the chart, time out was performed. Appropriate time out taken. . The patient was placed in the lithotomy position and the cervix brought into view with sterile speculum.  Portio of cervix cleansed x 2 with betadine swabs.  A tenaculum was placed in the anterior lip of the cervix.  The uterus was sounded for depth of 9cm. A pipelle was introduced to into the uterus, suction created,  and an endometrial sample was obtained. All equipment was removed and accounted for.  The patient tolerated the procedure well.    Assessment: 1. Pre-procedure lab exam    2. Post-menopausal bleeding  Surgical pathology, US Pelvis Complete, US Transvaginal Non-OB  3. Routine Papanicolaou smear  Cytology - PAP  4. End stage renal disease on dialysis    5. Hypertension       Plan: RTC in 2-3 weeks for results.  [place]. 04/06/2012,4:22 PM

## 2012-04-11 ENCOUNTER — Ambulatory Visit (HOSPITAL_COMMUNITY)
Admission: RE | Admit: 2012-04-11 | Discharge: 2012-04-11 | Disposition: A | Discharge status: Home / Self Care | Payer: Self-pay | Source: Ambulatory Visit | Attending: Physician Assistant | Admitting: Physician Assistant

## 2012-04-11 DIAGNOSIS — N950 Postmenopausal bleeding: Secondary | ICD-10-CM | POA: Insufficient documentation

## 2012-04-27 ENCOUNTER — Ambulatory Visit (INDEPENDENT_AMBULATORY_CARE_PROVIDER_SITE_OTHER): Payer: Self-pay | Admitting: Physician Assistant

## 2012-04-27 ENCOUNTER — Encounter: Payer: Self-pay | Admitting: Physician Assistant

## 2012-04-27 VITALS — BP 142/83 | HR 76 | Temp 97.9°F | Resp 16 | Wt 145.3 lb

## 2012-04-27 DIAGNOSIS — N950 Postmenopausal bleeding: Secondary | ICD-10-CM

## 2012-04-27 NOTE — Progress Notes (Signed)
Pt states she was told that she had an abnormal liver test in February. She had follow up @ [facility] and was told that liver function still abnormal.

## 2012-04-27 NOTE — Patient Instructions (Signed)
Hemorragia postmenopusica (Postmenopausal Bleeding)  La menopausia es cuando la menstruacin se detiene. Tambin consiste en no tener perodos durante 12 meses. El sangrado postmenopusico es cualquier sangrado despus de la menopausia. Cualquier tipo de sangrado despus de la menopausia es preocupante. Consulte con su mdico acerca de esto. Usted puede necesitar medicamentos, hormonas, [place] o un procedimiento para extirpar tumores o crecimientos.  CUIDADOS EN EL HOGAR  Mantenga un peso saludable.   Hgase exmenes plvicos regulares y pruebas de Papanicolaou.  SOLICITE AYUDA DE INMEDIATO SI:  Tiene fiebre.   Usted tiene escalofros, mareos dolor de [place], dolores musculares y Pensions consultant.   Tiene dolor con el sangrado.   Hay cogulos de sangre que vienen de su vagina.   Tiene sangrado y necesita ms de un apsito por hora.   Siente como si fuera a Curator (desmayarse).   Tiene ms hemorragia que cuando tena los perodos.   La hemorragia dura ms de 1 semana.   Siente dolor abdominal.   Tiene sangrado despus de tener sexo (relaciones sexuales).  EST SEGURO QUE:  Comprende estas instrucciones.   Controlar su enfermedad.   Solicitar ayuda inmediatamente si no mejora o si empeora.  Document Released: 05/02/2009 Document Revised: 12/03/2011 [facility] Patient Information 2012 [place].Menopausia (Menopause) La menopausia es el momento normal de la vida en que los perodos menstruales cesan completamente. Se considera definitiva cuando no ha habido perodos durante 12 meses consecutivos. Generalmente ocurre [employer] 89 y los 51 aos, y el promedio son los 41 aos. En muy raras ocasiones se produce antes de los 40 aos. En [employer], los ovarios dejan de producir hormonas femeninas, estrgenos y Immunologist. Esto puede causar sntomas indeseables y Architectural technologist. En algunos casos los sntomas pueden ocurrir entre 4 a 5 aos antes del  comienzo de la menopausia. No hay relacin entre el uso de anticonceptivos orales, el nmero de hijos que ha tenido, la raza o la edad en que los perodos menstruales comenzaron [place]). Las mujeres muy fumadoras y las muy delgadas pueden desarrollarla precozmente. CAUSAS  Los ovarios dejan de producir hormonas femeninas, estrgenos y Immunologist.   Otras causas son:   [place] en la que se extirpan ambos ovarios.   Los ovarios dejan de funcionar por causa desconocida.   Tumores en la glndula pituitaria, ubicada en el cerebro.   Enfermedades que [employer] ovarios y la produccin de hormonas.   Tratamiento de radioterapia en el abdomen o en la pelvis.   Quimioterapia que [employer].  SNTOMAS  Sofocos.   Sudoracin nocturna.   Disminucin del impulso sexual.   Sequedad vaginal y disminucin del tamao de los rganos genitales, lo que causa relaciones sexuales dolorosas.   Sequedad de la piel y aparicin de Glass blower/designer.   Cefaleas.   Cansancio.   Irritabilidad.   Problemas de memoria.   Aumento de [place].   Infecciones urinarias.   Crecimiento del vello en el rostro y [place].   Infertilidad.  Otros sintomas ms graves son:  Prdida de masa sea (osteoporosis), lo que causa fracturas de huesos.   Depresin.   Endurecimiento y Public librarian de las arterias (aterosclerosis), lo que puede ocasionar infartos e ictus.  DIAGNSTICO  Cuando el perodo menstrual falta durante 12 meses corridos.   Exmenes fsicos   Estudios hormonales de [place].  TRATAMIENTO Hay muchas opciones de tratamiento y casi tantas preguntas como opciones existen. La decisin de tratar o no los cambios que trae la menopausia es [place] decisin  que realiza el profesional de acuerdo con cada Advertising copywriter. El mdico comentar el tratamiento con usted. Juntos pueden decidir que tratamiento ser el mejor, por ejemplo:  Tratamiento de reemplazo hormonal.   Tratamiento de los  sntomas individuales con medicamentos (por ejemplo tranquilizantes para la depresin).   Hierbas que pueden ayudar en algunos sntomas especficos.   Psicoterapia con un psiquiatra o un psiclogo.   Terapia grupal.   No recibir tratamiento.  Hart los medicamentos segn las indicaciones.   Descanse y duerma lo suficiente.   Practicar ejercicios con regularidad.   Consuma una dieta rica en calcio (buena para los [place]) y soja (acta como un estrgeno).   Evite las bebidas alcohlicas.   No fume.   El consumo de vitamina E puede ayudar en ciertos casos.   Si tiene sofocos, vstase en capas.   Tome suplementos de calcio y vitamina D para fortalecer los [place].   Puede usar cremas de venta libre para la sequedad vaginal.   En algunos casos es de gran ayuda la terapia grupal.   Tambin puede ser de utilidad la acupuntura.  SOLICITE ANTENCIN MDICA SI:  No est segura de estar cursando la menopausia.   Tiene los sntomas y necesita consejo y [place].   Tiene perodos menstruales despus de los 78 aos.   Tiene dolor durante las [employer].   Est en la menopausia (no ha tenido perodos menstruales durante 12 meses) y desarrolla una hemorragia vaginal.   Necesita ser derivada a un especialista (gineclogo, psiquiatra o psiclogo) para Arts administrator.  SOLICITE ATENCIN MDICA DE INMEDIATO SI:  Sufre una depresin severa.   Tiene una hemorragia vaginal abundante.   Se ha cado y piensa que se ha roto un hueso.   Siente dolor al [employer].   Siente dolor en el pecho o en la pierna.   Siente latidos cardacos acelerados (palpitaciones)   Tiene dolores de cabeza intensos.   Desarrolla trastornos visuales.   Siente un bulto en el pecho.   Tiene dolor abdominal, o sufre una indigestin grave.  Document Released: 01/25/2007 Document Revised: 12/03/2011 [facility] Patient Information 2012 [place].

## 2012-04-27 NOTE — Progress Notes (Signed)
Chief Complaint:  Follow-up   Sabrina Mitchell is  51 y.o. No obstetric history on file..  Patient's last menstrual period was 02/11/2012..    She presents complaining of Follow-up  Presents for results of endo bx, pelvic US and pap. Reports no spotting since Feb 2013  Obstetrical/Gynecological History: OB History    Grav Para Term Preterm Abortions TAB SAB Ect Mult Living                  Past Medical History: Past Medical History  Diagnosis Date  . Hypertension   . Renal disorder   . End stage renal disease     hemodialysis 3x/wk    Past Surgical History: Past Surgical History  Procedure Date  . Av fistula placement   . Tubal ligation 2004    Family History: Family History  Problem Relation Age of Onset  . Hypertension Mother   . Hypertension Father   . Hypertension Sister   . Diabetes Brother   . Hypertension Brother     Social History: History  Substance Use Topics  . Smoking status: Never Smoker   . Smokeless tobacco: Not on file  . Alcohol Use: No    Allergies: No Known Allergies   (Not in a hospital admission)  Review of Systems - Negative except what is reviewed in HPI  Physical Exam   Blood pressure 142/83, pulse 76, temperature 97.9 F (36.6 C), temperature source Oral, resp. rate 16, weight 145 lb 4.8 oz (65.908 kg), last menstrual period 02/11/2012.  General: General appearance - alert, well appearing, and in no distress, oriented to person, place, and time and anxious Mental status - alert, oriented to person, place, and time Focused Gynecological Exam: examination not indicated  Labs: No results found for this or any previous visit (from the past 24 hour(s)). Imaging Studies:  US Transvaginal Non-ob  04/11/2012  *RADIOLOGY REPORT*  Clinical Data: Postmenopausal vaginal bleeding  TRANSABDOMINAL AND TRANSVAGINAL ULTRASOUND OF PELVIS Technique:  Both transabdominal and transvaginal ultrasound examinations of the pelvis were  performed. Transabdominal technique was performed for global imaging of the pelvis including uterus, ovaries, adnexal regions, and pelvic cul-de-sac.  Comparison: CT 02/18/2012.  No prior similar exam at this institution.   It was necessary to proceed with endovaginal exam following the transabdominal exam to visualize the right ovary, not seen transabdominally, and endometrium.  Findings:  Uterus: 7.6 x 4.3 x 4.0 cm.  Anteverted, anteflexed.  No focal abnormality.  Fundus mildly suboptimally visualized due to uterine position.  Endometrium: 3 mm.  Uniformly thin and echogenic.  Right ovary:  2.5 x 1.9 x 1.7 cm.  Normal.  Left ovary: 2.3 x 1.3 x 0.9 cm.  Normal.  Other findings: Trace free fluid noted in the cul-de-sac.  IMPRESSION: 3 mm endometrial stripe could indicate endometrial atrophy, which may be associated with postmenopausal vaginal bleeding.  Otherwise normal exam.  Original Report Authenticated By: Arline Asp, M.D.    Endometrium, biopsy - DEGENERATING SECRETORY-TYPE ENDOMETRIUM WITH STROMAL BREAKDOWN - NO ATYPIA, HYPERPLASIA OR MALIGNANCY  Assessment: 1. Post-menopausal bleeding   _Resolved   Plan: Above results reviewed Expectant management at this time RTC prn Continue all care as prescribed by PCP at [facility] and the [place] [facility]. 04/27/2012,3:13 PM

## 2012-11-18 ENCOUNTER — Emergency Department (INDEPENDENT_AMBULATORY_CARE_PROVIDER_SITE_OTHER)
Admission: EM | Admit: 2012-11-18 | Discharge: 2012-11-18 | Disposition: A | Discharge status: Home / Self Care | Payer: Self-pay | Source: Home / Self Care

## 2012-11-18 ENCOUNTER — Ambulatory Visit (HOSPITAL_COMMUNITY)
Admission: RE | Admit: 2012-11-18 | Discharge: 2012-11-18 | Disposition: A | Discharge status: Home / Self Care | Payer: Self-pay | Source: Ambulatory Visit | Attending: Internal Medicine | Admitting: Internal Medicine

## 2012-11-18 DIAGNOSIS — R109 Unspecified abdominal pain: Secondary | ICD-10-CM | POA: Insufficient documentation

## 2012-11-18 LAB — COMPREHENSIVE METABOLIC PANEL
Albumin: 4.4 g/dL (ref 3.5–5.2)
Alkaline Phosphatase: 95 U/L (ref 39–117)
BUN: 32 mg/dL — ABNORMAL HIGH (ref 6–23)
CO2: 29 mEq/L (ref 19–32)
Chloride: 95 mEq/L — ABNORMAL LOW (ref 96–112)
Creatinine, Ser: 6.59 mg/dL — ABNORMAL HIGH (ref 0.50–1.10)
GFR calc Af Amer: 8 mL/min — ABNORMAL LOW (ref >90)
GFR calc non Af Amer: 7 mL/min — ABNORMAL LOW (ref >90)
Glucose, Bld: 83 mg/dL (ref 70–99)
Potassium: 3.4 mEq/L — ABNORMAL LOW (ref 3.5–5.1)
Total Bilirubin: 0.5 mg/dL (ref 0.3–1.2)

## 2012-11-18 MED ORDER — BISACODYL 5 MG PO TBEC: 5.0000 mg | DELAYED_RELEASE_TABLET | Freq: Every day | ORAL | Status: DC | PRN

## 2012-11-18 MED ORDER — DICYCLOMINE HCL 10 MG PO CAPS: 10.0000 mg | ORAL_CAPSULE | Freq: Three times a day (TID) | ORAL | Status: DC

## 2012-11-18 MED ORDER — AMLODIPINE BESYLATE 5 MG PO TABS: 5.0000 mg | ORAL_TABLET | Freq: Every day | ORAL | Status: DC

## 2012-11-18 NOTE — ED Provider Notes (Signed)
Patient Demographics  Sabrina Mitchell Corky Crafts, is a 51 y.o. female  [id number]  [id number]  DOB - Feb 21, 1961  Chief Complaint  Patient presents with  . Abdominal Pain        Subjective:   Ma Corky Crafts today has, No headache, No chest pain, mild epigastric abdominal pain, nonradiating, aggravating or relieving factors, mild constipation no blood or mucus in stool - No Nausea, No new weakness tingling or numbness, No Cough - SOB.  Objective:    Filed Vitals:   11/18/12 1155  BP: 118/73  Pulse: 74  Temp: 98.3 F (36.8 C)  Resp: 18  SpO2: 98%     Exam  Awake Alert, Oriented X 3, No new F.N deficits, Normal affect [place].AT,PERRAL Supple Neck,No JVD, No cervical lymphadenopathy appriciated.  Symmetrical Chest wall movement, Good air movement bilaterally, CTAB RRR,No Gallops,Rubs or new Murmurs, No Parasternal Heave +ve B.Sounds, Abd Soft, Non tender, No organomegaly appriciated, No rebound - guarding or rigidity. No Cyanosis, Clubbing or edema, No new Rash or bruise    Data Review   CBC No results found for this basename: WBC:5,HGB:5,HCT:5,PLT:5,MCV:5,MCH:5,MCHC:5,RDW:5,NEUTRABS:5,LYMPHSABS:5,MONOABS:5,EOSABS:5,BASOSABS:5,BANDABS:5,BANDSABD:5 in the last 168 hours  Chemistries   No results found for this basename: NA:5,K:5,CL:5,CO2:5,GLUCOSE:5,BUN:5,CREATININE:5,GFRCGP,:5,CALCIUM:5,MG:5,AST:5,ALT:5,ALKPHOS:5,BILITOT:5 in the last 168 hours ------------------------------------------------------------------------------------------------------------------ No results found for this basename: HGBA1C:2 in the last 72 hours ------------------------------------------------------------------------------------------------------------------ No results found for this basename: CHOL:2,HDL:2,LDLCALC:2,TRIG:2,CHOLHDL:2,LDLDIRECT:2 in the last 72 hours ------------------------------------------------------------------------------------------------------------------ No results  found for this basename: TSH,T4TOTAL,FREET3,T3FREE,THYROIDAB in the last 72 hours ------------------------------------------------------------------------------------------------------------------ No results found for this basename: VITAMINB12:2,FOLATE:2,FERRITIN:2,TIBC:2,IRON:2,RETICCTPCT:2 in the last 72 hours  Coagulation profile  No results found for this basename: INR:5,PROTIME:5 in the last 168 hours  Urinalysis    Component Value Date/Time   COLORURINE YELLOW 02/18/2012 [place] 02/18/2012 1850   LABSPEC 1.005 02/18/2012 1850   PHURINE 8.5* 02/18/2012 1850   GLUCOSEU 100* 02/18/2012 1850   HGBUR TRACE* 02/18/2012 1850   BILIRUBINUR NEGATIVE 02/18/2012 [place] 02/18/2012 1850   PROTEINUR 100* 02/18/2012 1850   UROBILINOGEN 0.2 02/18/2012 1850   NITRITE NEGATIVE 02/18/2012 [place] 02/18/2012 1850     Prior to Admission medications   Medication Sig Start Date End Date Taking? Authorizing Provider  acetaminophen (TYLENOL) 500 MG tablet Take 1,000 mg by mouth every 6 (six) hours as needed. For pain    Historical Provider, MD  ALPRAZolam (XANAX) 0.25 MG tablet Take 0.125-0.25 mg by mouth 2 (two) times daily as needed.    Historical Provider, MD  amLODipine (NORVASC) 5 MG tablet Take 1 tablet (5 mg total) by mouth daily. 11/18/12   Thurnell Lose, MD  bisacodyl (DULCOLAX) 5 MG EC tablet Take 1 tablet (5 mg total) by mouth daily as needed for constipation. 11/18/12   Thurnell Lose, MD  calcium carbonate (TUMS EX) 750 MG chewable tablet Chew 1-2 tablets by mouth See admin instructions. 1 tablet with each meal at home, 2 tablets with a meal on dialysis days    Historical Provider, MD  carvedilol (COREG) 25 MG tablet Take 12.5 mg by mouth at bedtime.     Historical Provider, MD  dicyclomine (BENTYL) 10 MG capsule Take 1 capsule (10 mg total) by mouth 4 (four) times daily -  before meals and at bedtime. 11/18/12   Thurnell Lose, MD    multivitamin (RENA-VIT) TABS tablet Take 1 tablet by mouth daily.    Historical Provider, MD  sevelamer (RENVELA) 800 MG tablet Take 800 mg by mouth 3 (three)  times daily with meals.    Historical Provider, MD  temazepam (RESTORIL) 15 MG capsule Take 15-30 mg by mouth at bedtime as needed. For sleep    Historical Provider, MD     Assessment & Plan   1. Patient with CTD stage V who follows with Dr. Moshe Cipro, hypertension, chronic abdominal pain for which she has extensive workup at [facility] including CT abdomen and ultrasound of her rales which were unremarkable, him back for dull epigastric abdominal pain which is nonradiating, not associated with nausea vomiting, she is mildly constipated, no blood or mucus in stool, no aggravating or relieving factors the pain has been there for on and off 6 months. Highly benign exam patient appears to be in no distress pain is extremely nonspecific and could be related to her constipation.  Will obtain a KUB, check CMP and lipase, will give her Dulcolax along with some Bentyl patient to come back in a week's time for followup, if pain persists outpatient GI followup.   Thurnell Lose M.D on 11/18/2012 at 12:08 PM   Thurnell Lose, MD 11/18/12 1209

## 2012-11-18 NOTE — ED Notes (Signed)
C/o abd pain.Marland Kitchen x2 weeks   Patient goes to diaylasis 3 times a week

## 2012-11-20 ENCOUNTER — Telehealth: Payer: Self-pay | Admitting: Internal Medicine

## 2012-11-20 ENCOUNTER — Encounter: Payer: Self-pay | Admitting: Internal Medicine

## 2012-11-20 NOTE — Telephone Encounter (Signed)
Reviewed last bmp, creat more elevated, has ESRD, sent a letter and called her at her cell  [phone] her friend translated, to take her to Dr Clover Mealy her Nephrologist ASAP, within 1 week, ? Has dialysis started, her friend states she is getting 3/week dialysis.

## 2012-11-25 ENCOUNTER — Emergency Department (HOSPITAL_COMMUNITY)
Admission: EM | Admit: 2012-11-25 | Discharge: 2012-11-25 | Disposition: A | Discharge status: Home / Self Care | Payer: Self-pay | Source: Home / Self Care

## 2012-11-25 ENCOUNTER — Encounter (HOSPITAL_COMMUNITY): Payer: Self-pay

## 2012-11-25 DIAGNOSIS — N186 End stage renal disease: Secondary | ICD-10-CM

## 2012-11-25 DIAGNOSIS — I10 Essential (primary) hypertension: Secondary | ICD-10-CM

## 2012-11-25 DIAGNOSIS — K219 Gastro-esophageal reflux disease without esophagitis: Secondary | ICD-10-CM

## 2012-11-25 DIAGNOSIS — R109 Unspecified abdominal pain: Secondary | ICD-10-CM

## 2012-11-25 DIAGNOSIS — K5900 Constipation, unspecified: Secondary | ICD-10-CM

## 2012-11-25 MED ORDER — OMEPRAZOLE 20 MG PO TBEC: 1.0000 | DELAYED_RELEASE_TABLET | Freq: Every morning | ORAL | Status: DC

## 2012-11-25 NOTE — ED Provider Notes (Signed)
History     CSN: [id number]  Arrival date & time 11/25/12  1044   Chief Complaint  Patient presents with  . Follow-up    lab results and xray results   The history is provided by the patient. The history is limited by a language barrier. A language interpreter was used.   The patient presents today to followup.  She is having some abdominal pain and would like to get the results of her labs and x-rays from her recent visit.  She reports that she still continues to have intermittent abdominal pain associated with eating spicy foods.  She also has constipation.  She reports that she's been taking the laxatives and it has helped her constipation.  She reports that she is having hemodialysis 3 times per week.  I reviewed the results of her x-rays with her and also the lab results.  The patient denies chest pain and shortness of breath.  She denies nausea and vomiting. Past Medical History  Diagnosis Date  . Hypertension   . Renal disorder   . End stage renal disease     hemodialysis 3x/wk    Past Surgical History  Procedure Date  . Av fistula placement   . Tubal ligation 2004    Family History  Problem Relation Age of Onset  . Hypertension Mother   . Hypertension Father   . Hypertension Sister   . Diabetes Brother   . Hypertension Brother     History  Substance Use Topics  . Smoking status: Never Smoker   . Smokeless tobacco: Not on file  . Alcohol Use: No    OB History    Grav Para Term Preterm Abortions TAB SAB Ect Mult Living                  Review of Systems  Constitutional: Negative.   HENT: Negative.   Eyes: Negative.   Respiratory: Negative.   Cardiovascular: Negative.   Gastrointestinal:       Intermittent cramping-like abdominal pain in the periumbilical area and epigastric area associated with eating spicy foods  Genitourinary: Negative.   Musculoskeletal: Negative.   Neurological: Negative.   Hematological: Negative.   Psychiatric/Behavioral:  Negative.     Allergies  Review of patient's allergies indicates no known allergies.  Home Medications   Current Outpatient Rx  Name  Route  Sig  Dispense  Refill  . ACETAMINOPHEN 500 MG PO TABS   Oral   Take 1,000 mg by mouth every 6 (six) hours as needed. For pain         . ALPRAZOLAM 0.25 MG PO TABS   Oral   Take 0.125-0.25 mg by mouth 2 (two) times daily as needed.         Marland Kitchen AMLODIPINE BESYLATE 5 MG PO TABS   Oral   Take 1 tablet (5 mg total) by mouth daily.   30 tablet   0   . BISACODYL 5 MG PO TBEC   Oral   Take 1 tablet (5 mg total) by mouth daily as needed for constipation.   30 tablet   0   . CALCIUM CARBONATE ANTACID 750 MG PO CHEW   Oral   Chew 1-2 tablets by mouth See admin instructions. 1 tablet with each meal at home, 2 tablets with a meal on dialysis days         . CARVEDILOL 25 MG PO TABS   Oral   Take 12.5 mg by mouth at bedtime.          Marland Kitchen  DICYCLOMINE HCL 10 MG PO CAPS   Oral   Take 1 capsule (10 mg total) by mouth 4 (four) times daily -  before meals and at bedtime.   20 capsule   0   . RENA-VITE PO TABS   Oral   Take 1 tablet by mouth daily.         Marland Kitchen SEVELAMER CARBONATE 800 MG PO TABS   Oral   Take 800 mg by mouth 3 (three) times daily with meals.         Marland Kitchen TEMAZEPAM 15 MG PO CAPS   Oral   Take 15-30 mg by mouth at bedtime as needed. For sleep           BP 121/77  Pulse 70  Temp 98.1 F (36.7 C) (Oral)  Resp 20  SpO2 100%  LMP 02/11/2012  Physical Exam  Constitutional: She is oriented to person, place, and time. She appears well-developed and well-nourished. No distress.  HENT:  Head: Normocephalic and atraumatic.  Eyes: EOM are normal. Pupils are equal, round, and reactive to light.  Neck: Normal range of motion. Neck supple. No JVD present.  Abdominal: Soft. Bowel sounds are normal. She exhibits no distension and no mass. There is tenderness. There is no rebound and no guarding.       Mild epigastric  tenderness with deep palpation, no guarding or rebound tenderness normal bowel sounds heard no hepatosplenomegaly  Musculoskeletal: Normal range of motion. She exhibits no edema and no tenderness.  Lymphadenopathy:    She has no cervical adenopathy.  Neurological: She is alert and oriented to person, place, and time.  Skin: Skin is warm and dry.  Psychiatric: She has a normal mood and affect. Her behavior is normal. Judgment and thought content normal.    ED Course  Procedures (including critical care time)  Labs Reviewed - No data to display No results found.   No diagnosis found.   MDM  IMPRESSION  Intermittent abdominal pain  GERD  End-stage renal disease on hemodialysis  Chronic constipation  RECOMMENDATIONS / PLAN  Trial of omeprazole 20 mg daily, avoid spicy foods, anti-reflux diet recommended  Avoid spicy foods Take meds for constipation as directed Omeprazole 20 mg  GI consult if persists  FOLLOW UP One month for recheck  The patient was given clear instructions to go to ER or return to medical center if symptoms don't improve, worsen or new problems develop.  The patient verbalized understanding.  The patient was told to call to get lab results if they haven't heard anything in the next week.         Murlean Iba, MD 11/25/12 [phone]

## 2012-11-25 NOTE — ED Notes (Signed)
Patient states here for follow up , would like lab results and x ray results

## 2013-02-25 DIAGNOSIS — N390 Urinary tract infection, site not specified: Secondary | ICD-10-CM

## 2013-02-25 HISTORY — DX: Urinary tract infection, site not specified: N39.0

## 2013-03-15 ENCOUNTER — Other Ambulatory Visit: Payer: Self-pay

## 2013-03-15 DIAGNOSIS — T82598A Other mechanical complication of other cardiac and vascular devices and implants, initial encounter: Secondary | ICD-10-CM

## 2013-04-04 ENCOUNTER — Encounter: Payer: Self-pay | Admitting: Vascular Surgery

## 2013-04-05 ENCOUNTER — Ambulatory Visit (INDEPENDENT_AMBULATORY_CARE_PROVIDER_SITE_OTHER): Payer: Self-pay | Admitting: Vascular Surgery

## 2013-04-05 ENCOUNTER — Encounter: Payer: Self-pay | Admitting: Vascular Surgery

## 2013-04-05 ENCOUNTER — Encounter (INDEPENDENT_AMBULATORY_CARE_PROVIDER_SITE_OTHER): Payer: Self-pay | Admitting: *Deleted

## 2013-04-05 VITALS — BP 141/88 | HR 68 | Resp 16 | Ht 59.0 in | Wt 144.0 lb

## 2013-04-05 DIAGNOSIS — T82598A Other mechanical complication of other cardiac and vascular devices and implants, initial encounter: Secondary | ICD-10-CM

## 2013-04-05 DIAGNOSIS — R0789 Other chest pain: Secondary | ICD-10-CM

## 2013-04-05 DIAGNOSIS — N186 End stage renal disease: Secondary | ICD-10-CM

## 2013-04-05 DIAGNOSIS — R109 Unspecified abdominal pain: Secondary | ICD-10-CM

## 2013-04-05 DIAGNOSIS — M79609 Pain in unspecified limb: Secondary | ICD-10-CM | POA: Insufficient documentation

## 2013-04-05 DIAGNOSIS — R1084 Generalized abdominal pain: Secondary | ICD-10-CM | POA: Insufficient documentation

## 2013-04-05 NOTE — Progress Notes (Addendum)
[facility] [place]  Patient name: Sabrina Mitchell MRN: [id number] DOB: Nov 07, 1961 Sex: female  REASON FOR VISIT: pain in the left arm with dialysis.  HPI: Sabrina Mitchell is a 52 y.o. female who has a left upper arm brachiocephalic fistula which is been in for approximately 2 years. She has pain in the fistula when she is on dialysis. She does not have pain in the fistula when she is not on dialysis. She denies any hand pain or paresthesias in the left arm. The history is obtained through her translator. She states that she was unable to tolerate larger needles so it sounds like they have been using a buttonhole technique and using smaller needles. She has not had any bleeding problems with the fistula.  REVIEW OF SYSTEMS: Valu.Nieves ] denotes positive finding; [  ] denotes negative finding  CARDIOVASCULAR:  [ ]  chest pain   [ ]  dyspnea on exertion    CONSTITUTIONAL:  [ ]  fever   [ ]  chills  PHYSICAL EXAM: Filed Vitals:   04/05/13 1237  BP: 141/88  Pulse: 68  Resp: 16  Height: 4\' 11"  (1.499 m)  Weight: 144 lb (65.318 kg)  SpO2: 98%   Body mass index is 29.07 kg/(m^2). GENERAL: The patient is a well-nourished female, in no acute distress. The vital signs are documented above. CARDIOVASCULAR: There is a regular rate and rhythm  PULMONARY: There is good air exchange bilaterally without wheezing or rales. Her left upper arm AV fistula has an excellent thrill and is not pulsatile. She has a palpable left radial pulse. There is no erythema in the left arm to suggest infection.  I have independently interpreted her duplex of her left upper arm AV fistula. This shows that the diameters range from 0.9-1.05 cm. Depths ranged from 0.2 1.45 cm. There were no areas of significantly increased callosity and the fistula.  MEDICAL ISSUES: The fistula appears to be functioning adequately and she only has pain with cannulation of the fistula. I do not see any problems  with the fistula that could explain the pain she is having. I discussed the option of obtaining a fistulogram although I think the yield with this would be very low. She will have him try to cannulate different areas of the fistula to see if this helps. If not I don't really do not see any other options except considering new access although I would perform a fistulogram of current fistula before abandoning this.  [place] [facility] [place] Beeper: [phone]

## 2013-04-05 NOTE — Progress Notes (Signed)
Interpreter Lesle Chris for Dr Scot Dock  [facility].

## 2013-12-09 ENCOUNTER — Encounter (HOSPITAL_COMMUNITY): Payer: Self-pay | Admitting: Emergency Medicine

## 2013-12-09 ENCOUNTER — Emergency Department (HOSPITAL_COMMUNITY)
Admission: EM | Admit: 2013-12-09 | Discharge: 2013-12-09 | Disposition: A | Discharge status: Home / Self Care | Payer: Self-pay | Attending: Emergency Medicine | Admitting: Emergency Medicine

## 2013-12-09 DIAGNOSIS — F411 Generalized anxiety disorder: Secondary | ICD-10-CM | POA: Insufficient documentation

## 2013-12-09 DIAGNOSIS — Z992 Dependence on renal dialysis: Secondary | ICD-10-CM | POA: Insufficient documentation

## 2013-12-09 DIAGNOSIS — Z79899 Other long term (current) drug therapy: Secondary | ICD-10-CM | POA: Insufficient documentation

## 2013-12-09 DIAGNOSIS — I12 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 disease: Secondary | ICD-10-CM | POA: Insufficient documentation

## 2013-12-09 DIAGNOSIS — Z87440 Personal history of urinary (tract) infections: Secondary | ICD-10-CM | POA: Insufficient documentation

## 2013-12-09 DIAGNOSIS — N186 End stage renal disease: Secondary | ICD-10-CM | POA: Insufficient documentation

## 2013-12-09 DIAGNOSIS — R079 Chest pain, unspecified: Secondary | ICD-10-CM

## 2013-12-09 DIAGNOSIS — R0789 Other chest pain: Secondary | ICD-10-CM | POA: Insufficient documentation

## 2013-12-09 DIAGNOSIS — G4700 Insomnia, unspecified: Secondary | ICD-10-CM

## 2013-12-09 DIAGNOSIS — Z862 Personal history of diseases of the blood and blood-forming organs and certain disorders involving the immune mechanism: Secondary | ICD-10-CM | POA: Insufficient documentation

## 2013-12-09 DIAGNOSIS — F419 Anxiety disorder, unspecified: Secondary | ICD-10-CM

## 2013-12-09 NOTE — ED Notes (Signed)
Interrupter phone used to communicate with patient who speaks spanish. Patient states she has had this type of chest pain post dialysis in the past but not as intense as today. She also became hot and sweaty from her abdomen to her feet. She only felt nauseated, denies diaphoresis or shortness of breath. Pain is described as sharp stabbing pain that is intermittent. Does not increase with inspiration but does increase with palpitation.

## 2013-12-09 NOTE — ED Notes (Signed)
Family at bedside. 

## 2013-12-09 NOTE — ED Notes (Signed)
Patient arrived via [facility] from home. Patient is a dialysis patient (t,w,s) she completed her dialysis today and was sitting at home when she developed chest pain to her mid chest with nausea, denies shortness of breath. Patient speaks spanish with very little english. Family member is enroute. VSS, A/O at this time, EKG NSR.

## 2013-12-09 NOTE — ED Provider Notes (Signed)
CSN: [id number]     Arrival date & time 12/09/13  1651 History   First MD Initiated Contact with Patient 12/09/13 1834     Chief Complaint  Patient presents with  . Chest Pain   (Consider location/radiation/quality/duration/timing/severity/associated sxs/prior Treatment) HPI Comments: Sabrina Mitchell is a 52 y.o. female who presents for evaluation of chest pain started while on dialysis. Her chest pain started after she began to be hypotensive as a result of the dialysis treatment. She was stabilized, completed her treatment, and transferred here by EMS for evaluation. She did not receive any treatment by EMS. She said this happened frequently in the past. Her physician at the dialysis unit as told her it was related to anxiety. She has fear of death from being on dialysis and having end-stage renal disease. She has hypertension. She does not have other associated medical problem. There's been no recent fever or chills, productive cough, nausea, vomiting, weakness, or dizziness. She occasionally has trouble sleeping, she uses temazepam, but it does not help. There are no other known modifying factors   The history is provided by the patient and a relative. A language interpreter was used.    Past Medical History  Diagnosis Date  . Hypertension   . Renal disorder   . End stage renal disease     hemodialysis 3x/wk  . Anemia   . UTI (lower urinary tract infection) March 2014   Past Surgical History  Procedure Laterality Date  . Tubal ligation  2004  . Av fistula placement Left 07/05/09    Dr. Kellie Simmering   Family History  Problem Relation Age of Onset  . Hypertension Mother   . Hypertension Father   . Hypertension Sister   . Diabetes Brother   . Hypertension Brother    History  Substance Use Topics  . Smoking status: Never Smoker   . Smokeless tobacco: Never Used  . Alcohol Use: No   OB History   Grav Para Term Preterm Abortions TAB SAB Ect Mult Living                  Review of Systems  All other systems reviewed and are negative.    Allergies  Review of patient's allergies indicates no known allergies.  Home Medications   Current Outpatient Rx  Name  Route  Sig  Dispense  Refill  . acetaminophen (TYLENOL) 500 MG tablet   Oral   Take 1,000 mg by mouth every 6 (six) hours as needed. For pain         . amLODipine (NORVASC) 5 MG tablet   Oral   Take 5 mg by mouth daily.         . calcium carbonate (TUMS EX) 750 MG chewable tablet   Oral   Chew 1-2 tablets by mouth See admin instructions. 1 tablet with each meal at home, 2 tablets with a meal on dialysis days         . carvedilol (COREG) 25 MG tablet   Oral   Take 12.5 mg by mouth at bedtime.          . multivitamin (RENA-VIT) TABS tablet   Oral   Take 1 tablet by mouth daily.         . sevelamer (RENVELA) 800 MG tablet   Oral   Take 800 mg by mouth 3 (three) times daily with meals.         . temazepam (RESTORIL) 15 MG capsule   Oral  Take 15-30 mg by mouth at bedtime as needed. For sleep          BP 103/61  Pulse 73  Resp 17  SpO2 100%  LMP 02/11/2012 Physical Exam  Nursing note and vitals reviewed. Constitutional: She is oriented to person, place, and time. She appears well-developed and well-nourished.  HENT:  Head: Normocephalic and atraumatic.  Eyes: Conjunctivae and EOM are normal. Pupils are equal, round, and reactive to light.  Neck: Normal range of motion and phonation normal. Neck supple.  Cardiovascular: Normal rate, regular rhythm and intact distal pulses.   Fistula left forearm, with normal thrill  Pulmonary/Chest: Effort normal and breath sounds normal. She exhibits no tenderness.  Abdominal: Soft. She exhibits no distension. There is no tenderness. There is no guarding.  Musculoskeletal: Normal range of motion.  Neurological: She is alert and oriented to person, place, and time. She exhibits normal muscle tone.  Skin: Skin is warm and dry.   Psychiatric: She has a normal mood and affect. Her behavior is normal. Judgment and thought content normal.    ED Course  Procedures (including critical care time) Labs Review Labs Reviewed - No data to display Imaging Review No results found.  EKG Interpretation   None       Date: 12/09/13  Rate: 85  Rhythm: normal sinus rhythm  QRS Axis: normal  PR and QT Intervals: normal  ST/T Wave abnormalities: normal  PR and QRS Conduction Disutrbances:none  Narrative Interpretation:   Old EKG Reviewed: unchanged   MDM   1. Nonspecific chest pain   2. Anxiety   3. Insomnia     Nonspecific transient chest pain, without worrisome findings, and a normal EKG. She has had this previously. This discomfort is not indicative of coronary artery disease or an unstable cardiovascular condition. I discussed the possibility of anxiety as a cause of this pain. The daughter is with her and relates that she will attempt to get the patient to a therapist. She is given information on possible therapy treatment centers in the local area.  Nursing Notes Reviewed/ Care Coordinated, and agree without changes. Applicable Imaging Reviewed.  Interpretation of Laboratory Data incorporated into ED treatment   Plan: Home Medications- usual, plus recommended Benadryl for sleep; Home Treatments and Observation- rest, watch for progressive symptoms; return here if the recommended treatment, does not improve the symptoms; Recommended follow up- PCP of choice when necessary      Richarda Blade, MD 12/10/13 [phone]

## 2014-08-30 DIAGNOSIS — R509 Fever, unspecified: Secondary | ICD-10-CM | POA: Insufficient documentation

## 2014-08-30 DIAGNOSIS — R52 Pain, unspecified: Secondary | ICD-10-CM | POA: Insufficient documentation

## 2014-09-13 DIAGNOSIS — R197 Diarrhea, unspecified: Secondary | ICD-10-CM | POA: Insufficient documentation

## 2014-10-16 ENCOUNTER — Other Ambulatory Visit: Payer: Self-pay

## 2014-10-22 ENCOUNTER — Encounter (HOSPITAL_COMMUNITY)
Admission: RE | Disposition: A | Discharge status: Home / Self Care | Payer: Self-pay | Source: Ambulatory Visit | Attending: Vascular Surgery

## 2014-10-22 ENCOUNTER — Ambulatory Visit (HOSPITAL_COMMUNITY)
Admission: RE | Admit: 2014-10-22 | Discharge: 2014-10-22 | Disposition: A | Discharge status: Home / Self Care | Payer: Self-pay | Source: Ambulatory Visit | Attending: Vascular Surgery | Admitting: Vascular Surgery

## 2014-10-22 DIAGNOSIS — Z992 Dependence on renal dialysis: Secondary | ICD-10-CM | POA: Insufficient documentation

## 2014-10-22 DIAGNOSIS — T82898A Other specified complication of vascular prosthetic devices, implants and grafts, initial encounter: Secondary | ICD-10-CM

## 2014-10-22 DIAGNOSIS — T82848A Pain from vascular prosthetic devices, implants and grafts, initial encounter: Secondary | ICD-10-CM | POA: Insufficient documentation

## 2014-10-22 DIAGNOSIS — I12 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 disease: Secondary | ICD-10-CM | POA: Insufficient documentation

## 2014-10-22 DIAGNOSIS — N186 End stage renal disease: Secondary | ICD-10-CM | POA: Insufficient documentation

## 2014-10-22 HISTORY — PX: FISTULOGRAM: SHX5832

## 2014-10-22 LAB — POCT I-STAT, CHEM 8
BUN: 43 mg/dL — AB (ref 6–23)
CALCIUM ION: 1.26 mmol/L — AB (ref 1.12–1.23)
CHLORIDE: 98 meq/L (ref 96–112)
CREATININE: 9.3 mg/dL — AB (ref 0.50–1.10)
GLUCOSE: 86 mg/dL (ref 70–99)
HCT: 37 % (ref 36.0–46.0)
Hemoglobin: 12.6 g/dL (ref 12.0–15.0)
Potassium: 3.7 mEq/L (ref 3.7–5.3)
Sodium: 137 mEq/L (ref 137–147)
TCO2: 29 mmol/L (ref 0–100)

## 2014-10-22 SURGERY — FISTULOGRAM
Anesthesia: LOCAL

## 2014-10-22 MED ORDER — OXYCODONE-ACETAMINOPHEN 5-325 MG PO TABS: 1.0000 | ORAL_TABLET | ORAL | Status: DC | PRN

## 2014-10-22 MED ORDER — MIDAZOLAM HCL 2 MG/2ML IJ SOLN
INTRAMUSCULAR | Status: AC
  Filled 2014-10-22: qty 2

## 2014-10-22 MED ORDER — SODIUM CHLORIDE 0.9 % IJ SOLN
3.0000 mL | INTRAMUSCULAR | Status: DC | PRN
Start: 2014-10-22 — End: 2014-10-22

## 2014-10-22 MED ORDER — HEPARIN (PORCINE) IN NACL 2-0.9 UNIT/ML-% IJ SOLN
INTRAMUSCULAR | Status: AC
  Filled 2014-10-22: qty 500

## 2014-10-22 MED ORDER — FENTANYL CITRATE 0.05 MG/ML IJ SOLN
INTRAMUSCULAR | Status: AC
  Filled 2014-10-22: qty 2

## 2014-10-22 MED ORDER — LIDOCAINE HCL (PF) 1 % IJ SOLN
INTRAMUSCULAR | Status: AC
  Filled 2014-10-22: qty 30

## 2014-10-22 MED ORDER — HEPARIN SODIUM (PORCINE) 1000 UNIT/ML IJ SOLN
INTRAMUSCULAR | Status: AC
  Filled 2014-10-22: qty 1

## 2014-10-22 NOTE — H&P (Signed)
[facility]  Patient name: Sabrina Mitchell MRN: [id number] DOB: June 10, 1961 Sex: female  REASON FOR CONSULT: painful left upper arm AV fistula  HPI: Sabrina Mitchell is a 53 y.o. female who has had a left brachiocephalic fistula for approximately 4 years. She has had problems with significant pain during dialysis. She dialyzes Tuesdays Thursdays and Saturdays. She denies any recent uremic symptoms. She states that they normally cannulate the 2 same areas on her fistula.  Past Medical History  Diagnosis Date  . Hypertension   . Renal disorder   . End stage renal disease     hemodialysis 3x/wk  . Anemia   . UTI (lower urinary tract infection) March 2014    Family History  Problem Relation Age of Onset  . Hypertension Mother   . Hypertension Father   . Hypertension Sister   . Diabetes Brother   . Hypertension Brother     SOCIAL HISTORY: History  Substance Use Topics  . Smoking status: Never Smoker   . Smokeless tobacco: Never Used  . Alcohol Use: No    No Known Allergies  Current Facility-Administered Medications  Medication Dose Route Frequency Provider Last Rate Last Dose  . sodium chloride 0.9 % injection 3 mL  3 mL Intravenous PRN Angelia Mould, MD        REVIEW OF SYSTEMS: Valu.Nieves ] denotes positive finding; [  ] denotes negative finding CARDIOVASCULAR:  [ ]  chest pain   [ ]  chest pressure   [ ]  palpitations   [ ]  orthopnea   [ ]  dyspnea on exertion   [ ]  claudication   [ ]  rest pain   [ ]  DVT   [ ]  phlebitis PULMONARY:   [ ]  productive cough   [ ]  asthma   [ ]  wheezing NEUROLOGIC:   [ ]  weakness  [ ]  paresthesias  [ ]  aphasia  [ ]  amaurosis  [ ]  dizziness HEMATOLOGIC:   [ ]  bleeding problems   [ ]  clotting disorders MUSCULOSKELETAL:  [ ]  joint pain   [ ]  joint swelling [ ]  leg swelling GASTROINTESTINAL: [ ]   blood in stool  [ ]   hematemesis GENITOURINARY:  [ ]   dysuria  [ ]   hematuria PSYCHIATRIC:  [ ]  history of  major depression INTEGUMENTARY:  [ ]  rashes  [ ]  ulcers CONSTITUTIONAL:  [ ]  fever   [ ]  chills  PHYSICAL EXAM: Filed Vitals:   10/22/14 0858  BP: 151/86  Pulse: 77  Temp: 98.1 F (36.7 C)  TempSrc: Oral  Resp: 18  Height: 4\' 11"  (1.499 m)  Weight: 144 lb (65.318 kg)  SpO2: 100%   Body mass index is 29.07 kg/(m^2). GENERAL: The patient is a well-nourished female, in no acute distress. The vital signs are documented above. CARDIOVASCULAR: There is a regular rate and rhythm. Her fistula is somewhat pulsatile. PULMONARY: There is good air exchange bilaterally without wheezing or rales. ABDOMEN: Soft and non-tender with normal pitched bowel sounds.  MUSCULOSKELETAL: There are no major deformities or cyanosis. NEUROLOGIC: No focal weakness or paresthesias are detected. SKIN: There are no ulcers or rashes noted. PSYCHIATRIC: The patient has a normal affect.  DATA:  Lab Results  Component Value Date   WBC 6.1 02/18/2012   HGB 11.9* 02/18/2012   HCT 34.9* 02/18/2012   MCV 88.8 02/18/2012   PLT 226 02/18/2012   Lab Results  Component Value Date   NA 140 11/18/2012   K  3.4* 11/18/2012   CL 95* 11/18/2012   CO2 29 11/18/2012   Lab Results  Component Value Date   CREATININE 6.59* 11/18/2012   MEDICAL ISSUES: END-STAGE RENAL DISEASE: This patient is having problems with her left brachiocephalic fistula. She has significant pain during dialysis. She is brought in for a fistulogram and possible venoplasty. I have discussed the procedure and potential complications, including, but not limited to, bleeding, injury to the fistula, or thrombosis of the fistula. All of her questions were answered and she is agreeable to proceed.  [place] [facility] [place] Beeper: [phone]

## 2014-10-22 NOTE — Discharge Instructions (Signed)
Fstula A-V: Instrucciones para el alta (AV Fistula, Care After) Siga estas instrucciones durante las prximas semanas. Estas indicaciones le proporcionan informacin general acerca de cmo deber cuidarse despus del procedimiento. El mdico tambin podr darle instrucciones especficas. El tratamiento ha sido planificado segn las prcticas mdicas actuales, pero en algunos casos pueden ocurrir problemas. Comunquese con el mdico si tiene algn problema o tiene preguntas despus del procedimiento.  INSTRUCCIONES PARA EL CUIDADO DOMICILIARIO  NO conduzca ni utilice el servicio pblico de transporte slo.  NO beba alcohol.  No tome medicamentos que no le haya prescrito el mdico.  NO firme documentos importantes ni tome decisiones trascendentes.  Mantngase en compaa de una persona responsable.  Pdale a su mdico que le ensee a controlar su acceso (Fstula AV) en su casa palpando sobre ella una vibracin (llamada frmito) o a travs de un sonido llamado soplo.  Luego de la ciruga, la vena necesitar tiempo para crecer y [place]. La vena necesita madurar para que se puedan insertar agujas para la dilisis. El mdico le informar con precisin los pasos necesarios para este procedimiento.  Mantenga los vendajes limpios y secos.  Mantenga el brazo elevado por sobre el nivel de su corazn sobre [place].  Descanse.  Use el brazo como lo hace siempre para las [employer].  Los puntos de sutura o las cintas se removern en 10-14 das, o segn lo haya indicado el profesional que lo asiste.  NO duerma ni se recueste sobre el rea de la fstula o sobre ese brazo. Esto puede disminuir o detener el flujo sanguneo que va por la fstula.  NO permita que le tomen la presin en ese brazo.  NO permita que le extraigan sangre del injerto.  No use ropa apretada alrededor de la fstula o en el brazo.  Evite levantar objetos pesados con el brazo que tiene la fstula.  No use  cremas o lociones sobre el sitio del acceso. SOLICITE ATENCIN MDICA SI:  Jaclynn Guarneri.  La inflamacin alrededor de la fstula empeora y/o siente ms dolor.  Observa un sangrado en la zona de la fstula o en cualquier Visual merchandiser.  Observa pus u otra secrecin en la zona de la fstula.  Observa un enrojecimiento de la piel o rayas rojas sobre la piel alrededor, por encima o por debajo de la zona de la fstula.  El acceso est caliente.  Tiene sntomas similares a Chief Financial Officer. SOLICITE ATENCIN MDICA DE INMEDIATO SI:  Le apareciera dolor, entumecimiento, o un color plido inusual en la mano del brazo de la fstula.  Siente mareos o debilidad que no ha tenido antes.  Siente falta de aire.  Comienza a Education administrator.  La fstula se desconecta o se rompe con un sangrado que no puede ser controlado con facilidad. Pida ayuda mdica local inmediatamente. No intente conducir slo hasta el hospital. ASEGRESE DE QUE:   Comprende estas instrucciones.  Controlar su enfermedad.  Solicitar ayuda de inmediato si no mejora o si empeora. Document Released: 12/14/2005 Document Revised: 03/07/2012 [facility] Patient Information 2015 [place]. This information is not intended to replace advice given to you by your health care provider. Make sure you discuss any questions you have with your health care provider. Excuse from Work, [employer], or Physical Activity __________________________________________________ needs to be excused from: _____ Work _____ [employer] _____ Physical activity Beginning now and through the following date: ____________________ _____ He/she may return to work or school but still avoid physical activity from now  until: ____________________ _____ He/she may return to full physical activity as of: ____________________ Caregiver's signature: ________________________________________  Date: ______________________________________________________ Document  Released: 06/09/2001 Document Revised: 03/07/2012 Document Reviewed: 12/14/2005 ExitCare Patient Information 2015 [place], [place]. This information is not intended to replace advice given to you by your health care provider. Make sure you discuss any questions you have with your health care provider.

## 2014-10-22 NOTE — Progress Notes (Signed)
Interpreter Lesle Chris for Dr Scot Dock for  fistula check, pre and during procedure

## 2014-10-22 NOTE — Op Note (Signed)
   PATIENT: Sabrina Mitchell  MRN: [id number] DOB: [birth year]    DATE OF PROCEDURE: 10/22/2014  INDICATIONS: Sabrina Mitchell is a 52 y.o. female who is having pain during dialysis. She was set up for a fistulogram.  PROCEDURE:  1. Ultrasound-guided access to left brachiocephalic AV fistula 2. Fistulogram left brachiocephalic AV fistula 3. Venoplasty of 2 stenoses within left brachiocephalic AV fistula  SURGEON: Judeth Cornfield. Scot Dock, MD, FACS  ANESTHESIA: local with sedation   EBL: minimal  TECHNIQUE: The patient was taken to the peripheral vascular laboratory C1 milligram of Versed and 50 g of fentanyl. Under ultrasound guidance, after the skin was anesthetized, the proximal fistula was cannulated with a micropuncture needle and a micropuncture sheath introduced over the wire. A fistulogram was then obtained evaluating the fistula from the cannulation site to include the central veins. Next a blood pressure cuff was inflated to suprasystolic pressures and a retrograde shot obtained to evaluate the proximal anastomosis. It was an area of stenosis in the cephalic vein before it entered the's subclavian vein. I elected to balloon this. The micropuncture sheath was exchanged for a 6 French sheath and then the patient received 3000 units of IV heparin. The balloon was positioned across the area of stenosis inflated to 10 atm for 1 minute. There was some residual stenosis. It was then inflated to 20 atm for 1 minute with improved results. In addition it was area of stenosis proximal to an area that was aneurysmal and this too was inflated to 20 atm for 1 minute. There was no residual waist during inflation. Both stenoses were somewhat resilient. She did have significant pain with inflation of the balloon and therefore I was reluctant to use a larger balloon. The balloon was then removed and the wire removed. A 4-0 Monocryl suture was placed around the cannulation site and it was good  hemostasis.  FINDINGS:  1. Patent left brachiocephalic AV fistula. 2. 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 There is also a small competing branch at this location. 3. There was a second stenosis in the cephalic vein before entering the subclavian vein which was also ballooned as described above.  Deitra Mayo, MD, FACS [facility]  DATE OF DICTATION:   10/22/2014

## 2014-11-28 DIAGNOSIS — E1129 Type 2 diabetes mellitus with other diabetic kidney complication: Secondary | ICD-10-CM | POA: Insufficient documentation

## 2014-12-06 ENCOUNTER — Encounter (HOSPITAL_COMMUNITY): Payer: Self-pay | Admitting: Vascular Surgery

## 2014-12-18 DIAGNOSIS — D689 Coagulation defect, unspecified: Secondary | ICD-10-CM | POA: Insufficient documentation

## 2014-12-26 DIAGNOSIS — L299 Pruritus, unspecified: Secondary | ICD-10-CM | POA: Insufficient documentation

## 2015-01-15 ENCOUNTER — Other Ambulatory Visit: Payer: Self-pay | Admitting: *Deleted

## 2015-01-15 DIAGNOSIS — T82848A Pain from vascular prosthetic devices, implants and grafts, initial encounter: Secondary | ICD-10-CM

## 2015-01-15 DIAGNOSIS — Z48812 Encounter for surgical aftercare following surgery on the circulatory system: Secondary | ICD-10-CM

## 2015-01-21 ENCOUNTER — Encounter: Payer: Self-pay | Admitting: Vascular Surgery

## 2015-01-23 ENCOUNTER — Ambulatory Visit (HOSPITAL_COMMUNITY)
Admission: RE | Admit: 2015-01-23 | Discharge: 2015-01-23 | Disposition: A | Discharge status: Home / Self Care | Payer: Self-pay | Source: Ambulatory Visit | Attending: Vascular Surgery | Admitting: Vascular Surgery

## 2015-01-23 ENCOUNTER — Ambulatory Visit (INDEPENDENT_AMBULATORY_CARE_PROVIDER_SITE_OTHER): Payer: Self-pay | Admitting: Vascular Surgery

## 2015-01-23 ENCOUNTER — Encounter: Payer: Self-pay | Admitting: Vascular Surgery

## 2015-01-23 VITALS — BP 115/76 | HR 79 | Ht 59.0 in | Wt 146.9 lb

## 2015-01-23 DIAGNOSIS — Z48812 Encounter for surgical aftercare following surgery on the circulatory system: Secondary | ICD-10-CM | POA: Insufficient documentation

## 2015-01-23 DIAGNOSIS — N186 End stage renal disease: Secondary | ICD-10-CM

## 2015-01-23 DIAGNOSIS — Z992 Dependence on renal dialysis: Secondary | ICD-10-CM

## 2015-01-23 DIAGNOSIS — T82848A Pain from vascular prosthetic devices, implants and grafts, initial encounter: Secondary | ICD-10-CM | POA: Insufficient documentation

## 2015-01-23 DIAGNOSIS — M79602 Pain in left arm: Secondary | ICD-10-CM

## 2015-01-23 NOTE — Progress Notes (Signed)
    Established Dialysis Access  History of Present Illness  Sabrina Mitchell is a 54 y.o. (November 07, 1961) female who presents for re-evaluation of left brachiocephalic arteriovenous fistula.  The patient notes pain in L arm after 2 hours of hemodialysis.  She also notes some difficulties with cannulation.  for permanent access.  To date, she has required a venoplasty of the fistula on 10/22/14.  She denies any steal sx.  The patient's PMH, PSH, SH, FamHx, Med, and Allergies are unchanged from 10/22/14.  On ROS today: no steal sx, no bleeding complications  Physical Examination  Filed Vitals:   01/23/15 1552  BP: 115/76  Pulse: 79  Height: 4\' 11"  (1.499 m)  Weight: 146 lb 14.4 oz (66.633 kg)  SpO2: 100%   Body mass index is 29.65 kg/(m^2).  General: A&O x 3, WD, WN  Pulmonary: Sym exp, good air movt, CTAB, no rales, rhonchi, & wheezing  Cardiac: RRR, Nl S1, S2, no Murmurs, rubs or gallops  Vascular: palpable L brachiocephalic arteriovenous fistula thrill with good bruit, obvious small-medium PSA distally  Gastrointestinal: soft, NTND, -G/R, - HSM, - masses, - CVAT B  Musculoskeletal: M/S 5/5 throughout , Extremities without  ischemic changes   Neurologic: Pain and light touch intact in extremities , Motor exam as listed above  Non-Invasive Vascular Imaging  Left Arm Access Duplex  (Date: 01/23/2015):   Diameters:  8.3-12.8 mm  Depth:  3.0-11.0 mm deep  PSA: 2.4 cm   Medical Decision Making  Sabrina Mitchell is a 54 y.o. female who presents with ESRD requiring hemodialysis, asx PSA of L BC AVF   Unclear to me the etiology of her L arm pain on hemodialysis.  Consider addition of EMLA cream for her comfort with cannulation.  Some of the difficulty with cannulation is due to proximal depth, >6 mm.  Given this is likely the segment requiring venoplasty previously and likely to need additional venoplasty, surgical superficialization is not recommended as  mobilization of the tissue around the fistula removes the ability to tamponade any bleeding caused by the venoplasty.  At this point, no advantage to excising this small-medium PSA of L BC AVF.  If she develops any flow rate decrease, she will need repeat L arm fistulogram with likely venoplasty.  Adele Barthel, MD [facility] [place] Office: [phone] Pager: [phone]  01/23/2015, 4:22 PM

## 2015-04-24 DIAGNOSIS — E1151 Type 2 diabetes mellitus with diabetic peripheral angiopathy without gangrene: Secondary | ICD-10-CM | POA: Insufficient documentation

## 2015-05-01 ENCOUNTER — Ambulatory Visit: Payer: Self-pay | Attending: Internal Medicine

## 2015-06-21 ENCOUNTER — Ambulatory Visit: Payer: Self-pay | Attending: Internal Medicine

## 2015-07-23 ENCOUNTER — Telehealth: Payer: Self-pay | Admitting: Nephrology

## 2015-07-23 NOTE — Telephone Encounter (Signed)
Pt experiencing pain in abdomen. Pt mentioned she has had issues with liver and pancreas in the past.. Pt needing to establish care soon due to pain. Please follow up with pt for advise upon her being seen at an urgent care or the ED. Thank you. 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

## 2015-07-23 NOTE — Telephone Encounter (Signed)
Nurse called patient, via in house interpreter, [place]. Patient verified date of birth. Patient reports pain for several days in the middle of stomach, sometimes mild and sometimes really strong. Currently pain is mild. Nurse advised patient to go to Urgent Care. Patient is currently at dialysis and will be there for 4 hours. Patient agrees to go to Urgent Care for abdominal pain, if urgent care is closed patient will go to Emergency department.  Patient voices understanding and has no further questions at this time. Patient needs to establish care with PCP. Patient voices understanding and has no further questions at this time. Patient transferred to front to make appointment.

## 2015-09-27 ENCOUNTER — Emergency Department (HOSPITAL_COMMUNITY)
Admission: EM | Admit: 2015-09-27 | Discharge: 2015-09-27 | Disposition: A | Discharge status: Home / Self Care | Payer: No Typology Code available for payment source | Attending: Physician Assistant | Admitting: Physician Assistant

## 2015-09-27 ENCOUNTER — Emergency Department (HOSPITAL_COMMUNITY)
Admission: EM | Admit: 2015-09-27 | Discharge: 2015-09-27 | Disposition: A | Discharge status: Home / Self Care | Payer: No Typology Code available for payment source | Source: Home / Self Care | Attending: Family Medicine | Admitting: Family Medicine

## 2015-09-27 ENCOUNTER — Encounter (HOSPITAL_COMMUNITY): Payer: Self-pay | Admitting: Family Medicine

## 2015-09-27 ENCOUNTER — Encounter (HOSPITAL_COMMUNITY): Payer: Self-pay | Admitting: Emergency Medicine

## 2015-09-27 ENCOUNTER — Emergency Department (HOSPITAL_COMMUNITY): Payer: No Typology Code available for payment source

## 2015-09-27 DIAGNOSIS — R51 Headache: Secondary | ICD-10-CM | POA: Insufficient documentation

## 2015-09-27 DIAGNOSIS — Z87440 Personal history of urinary (tract) infections: Secondary | ICD-10-CM | POA: Insufficient documentation

## 2015-09-27 DIAGNOSIS — N186 End stage renal disease: Secondary | ICD-10-CM | POA: Insufficient documentation

## 2015-09-27 DIAGNOSIS — N19 Unspecified kidney failure: Secondary | ICD-10-CM

## 2015-09-27 DIAGNOSIS — R519 Headache, unspecified: Secondary | ICD-10-CM

## 2015-09-27 DIAGNOSIS — R109 Unspecified abdominal pain: Secondary | ICD-10-CM

## 2015-09-27 DIAGNOSIS — Z992 Dependence on renal dialysis: Secondary | ICD-10-CM | POA: Insufficient documentation

## 2015-09-27 DIAGNOSIS — Z862 Personal history of diseases of the blood and blood-forming organs and certain disorders involving the immune mechanism: Secondary | ICD-10-CM | POA: Insufficient documentation

## 2015-09-27 DIAGNOSIS — I12 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 disease: Secondary | ICD-10-CM | POA: Insufficient documentation

## 2015-09-27 DIAGNOSIS — R1084 Generalized abdominal pain: Secondary | ICD-10-CM

## 2015-09-27 DIAGNOSIS — Z8719 Personal history of other diseases of the digestive system: Secondary | ICD-10-CM

## 2015-09-27 DIAGNOSIS — K2970 Gastritis, unspecified, without bleeding: Secondary | ICD-10-CM | POA: Insufficient documentation

## 2015-09-27 DIAGNOSIS — Z79899 Other long term (current) drug therapy: Secondary | ICD-10-CM | POA: Insufficient documentation

## 2015-09-27 LAB — COMPREHENSIVE METABOLIC PANEL
ALT: 23 U/L (ref 14–54)
AST: 24 U/L (ref 15–41)
Albumin: 3.9 g/dL (ref 3.5–5.0)
Alkaline Phosphatase: 147 U/L — ABNORMAL HIGH (ref 38–126)
Anion gap: 9 (ref 5–15)
BILIRUBIN TOTAL: 0.7 mg/dL (ref 0.3–1.2)
BUN: 30 mg/dL — AB (ref 6–20)
CALCIUM: 9.7 mg/dL (ref 8.9–10.3)
CO2: 29 mmol/L (ref 22–32)
CREATININE: 6.17 mg/dL — AB (ref 0.44–1.00)
Chloride: 100 mmol/L — ABNORMAL LOW (ref 101–111)
GFR calc Af Amer: 8 mL/min — ABNORMAL LOW (ref >60)
GFR calc non Af Amer: 7 mL/min — ABNORMAL LOW (ref >60)
Glucose, Bld: 129 mg/dL — ABNORMAL HIGH (ref 65–99)
Potassium: 3.5 mmol/L (ref 3.5–5.1)
Sodium: 138 mmol/L (ref 135–145)
TOTAL PROTEIN: 7.3 g/dL (ref 6.5–8.1)

## 2015-09-27 LAB — CBC
HCT: 33.8 % — ABNORMAL LOW (ref 36.0–46.0)
Hemoglobin: 11.1 g/dL — ABNORMAL LOW (ref 12.0–15.0)
MCH: 30.5 pg (ref 26.0–34.0)
MCHC: 32.8 g/dL (ref 30.0–36.0)
MCV: 92.9 fL (ref 78.0–100.0)
PLATELETS: 163 10*3/uL (ref 150–400)
RBC: 3.64 MIL/uL — AB (ref 3.87–5.11)
RDW: 14.1 % (ref 11.5–15.5)
WBC: 5.2 10*3/uL (ref 4.0–10.5)

## 2015-09-27 LAB — LIPASE, BLOOD: Lipase: 56 U/L — ABNORMAL HIGH (ref 22–51)

## 2015-09-27 MED ORDER — OMEPRAZOLE 20 MG PO CPDR: 20.0000 mg | DELAYED_RELEASE_CAPSULE | Freq: Every day | ORAL | Status: DC

## 2015-09-27 MED ORDER — GI COCKTAIL ~~LOC~~
30.0000 mL | Freq: Once | ORAL | Status: AC
  Administered 2015-09-27: 30 mL via ORAL
  Filled 2015-09-27: qty 30

## 2015-09-27 NOTE — ED Provider Notes (Signed)
CSN: [id number]     Arrival date & time 09/27/15  1454 History   First MD Initiated Contact with Patient 09/27/15 1702     Chief Complaint  Patient presents with  . Abdominal Pain     (Consider location/radiation/quality/duration/timing/severity/associated sxs/prior Treatment) HPI   Patient is a pleasant 54 year old female Hispanic female presenting with abdominal pain. She states it comes and goes for the last 4 days. She was seen in urgent care and transferred to our hospital. She has relatively normal labs and the mildly elevated lipase. She has a history of dialysis, hypertension,  This pain is mild. It is not associated with eating. There is no fever no vomiting or diarrhea no constipation.  Patient has had this in past and is gone away.  She had a headache when she was seen by urgent care. However she ate some Doritos and drink some soda and now feels much better.  Past Medical History  Diagnosis Date  . Hypertension   . Renal disorder   . End stage renal disease     hemodialysis 3x/wk  . Anemia   . UTI (lower urinary tract infection) March 2014   Past Surgical History  Procedure Laterality Date  . Tubal ligation  2004  . Av fistula placement Left 07/05/09    Dr. Kellie Simmering  . Fistulogram N/A 10/22/2014    Procedure: FISTULOGRAM;  Surgeon: Angelia Mould, MD;  Location: [facility] CATH LAB;  Service: Cardiovascular;  Laterality: N/A;   Family History  Problem Relation Age of Onset  . Hypertension Mother   . Hypertension Father   . Hypertension Sister   . Diabetes Brother   . Hypertension Brother    Social History  Substance Use Topics  . Smoking status: Never Smoker   . Smokeless tobacco: Never Used  . Alcohol Use: No   OB History    No data available     Review of Systems  Constitutional: Negative for activity change and fatigue.  HENT: Negative for congestion and drooling.   Eyes: Negative for discharge.  Respiratory: Negative for cough and chest tightness.    Cardiovascular: Negative for chest pain.  Gastrointestinal: Positive for abdominal pain. Negative for nausea, vomiting, diarrhea, constipation and abdominal distention.  Genitourinary: Negative for dysuria and difficulty urinating.  Musculoskeletal: Negative for joint swelling.  Skin: Negative for rash.  Allergic/Immunologic: Negative for immunocompromised state.  Neurological: Positive for headaches.  Psychiatric/Behavioral: Negative for behavioral problems and agitation.      Allergies  Review of patient's allergies indicates no known allergies.  Home Medications   Prior to Admission medications   Medication Sig Start Date End Date Taking? Authorizing Provider  acetaminophen (TYLENOL) 500 MG tablet Take 1,000 mg by mouth every 6 (six) hours as needed. For pain    Historical Provider, MD  amLODipine (NORVASC) 5 MG tablet Take 5 mg by mouth daily.    Historical Provider, MD  calcium carbonate (TUMS EX) 750 MG chewable tablet Chew 1-2 tablets by mouth See admin instructions. 1 tablet with each meal at home, 2 tablets with a meal on dialysis days    Historical Provider, MD  carvedilol (COREG) 25 MG tablet Take 12.5 mg by mouth at bedtime.     Historical Provider, MD  multivitamin (RENA-VIT) TABS tablet Take 1 tablet by mouth daily.    Historical Provider, MD  oxyCODONE-acetaminophen (ROXICET) 5-325 MG per tablet Take 1-2 tablets by mouth every 4 (four) hours as needed for severe pain. 10/22/14   Judeth Cornfield  Scot Dock, MD  sevelamer (RENVELA) 800 MG tablet Take 800 mg by mouth 3 (three) times daily with meals.    Historical Provider, MD  temazepam (RESTORIL) 15 MG capsule Take 15-30 mg by mouth at bedtime as needed. For sleep    Historical Provider, MD   BP 142/80 mmHg  Pulse 80  Temp(Src) 98.7 F (37.1 C) (Oral)  Resp 20  SpO2 97%  LMP 02/11/2012 Physical Exam  Constitutional: She is oriented to person, place, and time. She appears well-developed and well-nourished.  HENT:   Head: Normocephalic and atraumatic.  Eyes: Conjunctivae are normal. Right eye exhibits no discharge.  Neck: Neck supple.  Cardiovascular: Normal rate, regular rhythm and normal heart sounds.   No murmur heard. Pulmonary/Chest: Effort normal and breath sounds normal. She has no wheezes. She has no rales.  Abdominal: Soft. She exhibits no distension.  Mild mild left upper quadrant tenderness.  Musculoskeletal: Normal range of motion. She exhibits no edema.  Neurological: She is oriented to person, place, and time. No cranial nerve deficit.  Skin: Skin is warm and dry. No rash noted. She is not diaphoretic.   positive thrill left upper extremity  Psychiatric: She has a normal mood and affect. Her behavior is normal.  Nursing note and vitals reviewed.   ED Course  Procedures (including critical care time) Labs Review Labs Reviewed  LIPASE, BLOOD - Abnormal; Notable for the following:    Lipase 56 (*)    All other components within normal limits  COMPREHENSIVE METABOLIC PANEL - Abnormal; Notable for the following:    Chloride 100 (*)    Glucose, Bld 129 (*)    BUN 30 (*)    Creatinine, Ser 6.17 (*)    Alkaline Phosphatase 147 (*)    GFR calc non Af Amer 7 (*)    GFR calc Af Amer 8 (*)    All other components within normal limits  CBC - Abnormal; Notable for the following:    RBC 3.64 (*)    Hemoglobin 11.1 (*)    HCT 33.8 (*)    All other components within normal limits  URINALYSIS, ROUTINE W REFLEX MICROSCOPIC (NOT AT [facility])    Imaging Review No results found. I have personally reviewed and evaluated these images and lab results as part of my medical decision-making.   EKG Interpretation None      MDM   Final diagnoses:  None    Patient is a 54 year old Hispanic female presented in today with mild abdominal pain. Patient was sent here from urgent care. I'm unclear why patient was sent here to the hospital. She does have mild abdominal pain but I would not normally  CAT scan this female. However she was sent here for CAT scan, I will do a noncontrast CAT scan. She's got normal vitals. Only mild abdominal tenderness. And relatively normal labs. Patient eating drinking normally.  Anticipate ability to discharge home. Patient's headache is resolved after doritos and a soda.  Patietn and duaghter understand plan for omeprazoel and follow up.    Courteney Julio Alm, MD 09/27/15 [phone]

## 2015-09-27 NOTE — ED Notes (Signed)
Patient reports headache that has now subsided as well as pain in her umbillical area that subsided after she ate. Patient reports dialysis t, th, s. Pt alert, oriented.

## 2015-09-27 NOTE — ED Notes (Signed)
Dr. Thomasene Lot advised pt does not need iv access at this time.

## 2015-09-27 NOTE — ED Notes (Signed)
Pt unable to void at this time. 

## 2015-09-27 NOTE — ED Notes (Signed)
Patient transported to CT 

## 2015-09-27 NOTE — ED Notes (Signed)
C/o intermittent HA and abd pain onset 4 days Denies fevers, chills, n/v/d, urinary sx BM = today, regular Hx of pancreatitis; fistula on left arm for dialysis Alert and oriented x4... No acute distress.

## 2015-09-27 NOTE — ED Notes (Signed)
Pt here for 4 days of abdominal pain and HA.

## 2015-09-27 NOTE — ED Notes (Signed)
MD at bedside. 

## 2015-09-27 NOTE — ED Provider Notes (Signed)
CSN: [id number]     Arrival date & time 09/27/15  1327 History   First MD Initiated Contact with Patient 09/27/15 1356     Chief Complaint  Patient presents with  . Headache  . Abdominal Pain   (Consider location/radiation/quality/duration/timing/severity/associated sxs/prior Treatment) HPI Comments: 54 year old Hispanic female complaining of abdominal pain or a daily basis for 4 days. She has a history of chronic abdominal pain in part due to liver and pancreas problems. She has been seen in the emergency department for this before. She states this abdominal pain is moderate to severe and is not dissimilar from previous episodes. She also has a history of renal failure and she is on dialysis. Additional history includes hypertension and anemia. She denies problems with her bowel movement stating that she has them regularly. Patient also complains of a bitemporal and frontal headache. This is intermittent and described as pulsating. She has these frequently and there is no difference in this headache has once previously. He does not associated with problems of vision, speech, hearing or swallowing, focal paresthesias or weakness.   Past Medical History  Diagnosis Date  . Hypertension   . Renal disorder   . End stage renal disease     hemodialysis 3x/wk  . Anemia   . UTI (lower urinary tract infection) March 2014   Past Surgical History  Procedure Laterality Date  . Tubal ligation  2004  . Av fistula placement Left 07/05/09    Dr. Kellie Simmering  . Fistulogram N/A 10/22/2014    Procedure: FISTULOGRAM;  Surgeon: Angelia Mould, MD;  Location: [facility] CATH LAB;  Service: Cardiovascular;  Laterality: N/A;   Family History  Problem Relation Age of Onset  . Hypertension Mother   . Hypertension Father   . Hypertension Sister   . Diabetes Brother   . Hypertension Brother    Social History  Substance Use Topics  . Smoking status: Never Smoker   . Smokeless tobacco: Never Used  . Alcohol  Use: No   OB History    No data available     Review of Systems  Constitutional: Positive for activity change and appetite change. Negative for fever.  Eyes: Negative for visual disturbance.  Respiratory: Negative.  Negative for cough and shortness of breath.   Cardiovascular: Negative for chest pain.  Gastrointestinal: Positive for abdominal pain. Negative for nausea, vomiting, constipation, blood in stool and abdominal distention.  Genitourinary: Negative.        On renal dialysis  Musculoskeletal: Positive for myalgias.  Skin: Negative for rash.  Neurological: Positive for headaches. Negative for tremors, syncope, speech difficulty and numbness.  Psychiatric/Behavioral: The patient is nervous/anxious.   All other systems reviewed and are negative.   Allergies  Review of patient's allergies indicates no known allergies.  Home Medications   Prior to Admission medications   Medication Sig Start Date End Date Taking? Authorizing Provider  amLODipine (NORVASC) 5 MG tablet Take 5 mg by mouth daily.   Yes Historical Provider, MD  calcium carbonate (TUMS EX) 750 MG chewable tablet Chew 1-2 tablets by mouth See admin instructions. 1 tablet with each meal at home, 2 tablets with a meal on dialysis days   Yes Historical Provider, MD  carvedilol (COREG) 25 MG tablet Take 12.5 mg by mouth at bedtime.    Yes Historical Provider, MD  multivitamin (RENA-VIT) TABS tablet Take 1 tablet by mouth daily.   Yes Historical Provider, MD  sevelamer (RENVELA) 800 MG tablet Take 800 mg by mouth  3 (three) times daily with meals.   Yes Historical Provider, MD  acetaminophen (TYLENOL) 500 MG tablet Take 1,000 mg by mouth every 6 (six) hours as needed. For pain    Historical Provider, MD  oxyCODONE-acetaminophen (ROXICET) 5-325 MG per tablet Take 1-2 tablets by mouth every 4 (four) hours as needed for severe pain. 10/22/14   Angelia Mould, MD  temazepam (RESTORIL) 15 MG capsule Take 15-30 mg by mouth  at bedtime as needed. For sleep    Historical Provider, MD   Meds Ordered and Administered this Visit  Medications - No data to display  BP 141/82 mmHg  Pulse 75  Temp(Src) 99.2 F (37.3 C) (Oral)  Resp 16  SpO2 99%  LMP 02/11/2012 No data found.   Physical Exam  Constitutional: She is oriented to person, place, and time. She appears well-developed and well-nourished. No distress.  Eyes: EOM are normal.  Neck: Normal range of motion. Neck supple.  Cardiovascular: Normal rate, regular rhythm and normal heart sounds.   Pulmonary/Chest: Effort normal. No respiratory distress.  Abdominal: Soft. Bowel sounds are normal. She exhibits no distension and no mass. There is tenderness. There is no rebound and no guarding.  Tenderness to the epigastrium, periumbilical left mid abdomen and mid lower abdomen.  Musculoskeletal: Normal range of motion.  Neurological: She is alert and oriented to person, place, and time. No cranial nerve deficit. She exhibits normal muscle tone.  Skin: Skin is warm and dry. She is not diaphoretic.  Psychiatric: She has a normal mood and affect. Her behavior is normal.  Nursing note and vitals reviewed.   ED Course  Procedures (including critical care time)  Labs Review Labs Reviewed - No data to display  Imaging Review No results found.   Visual Acuity Review  Right Eye Distance:   Left Eye Distance:   Bilateral Distance:    Right Eye Near:   Left Eye Near:    Bilateral Near:         MDM   1. Generalized abdominal pain   2. Renal failure   3. History of pancreatitis   4. History of liver disease   5. Nonintractable episodic headache, unspecified headache type    This patient is transferred via shuttle to the [facility] number she department for evaluation and management of abdominal pain.    Janne Napoleon, NP 09/27/15 1444

## 2015-09-27 NOTE — Discharge Instructions (Signed)
Dolor abdominal en las mujeres (Abdominal Pain, Women) El dolor abdominal (en el estmago, la pelvis o el vientre) puede tener muchas causas. Es importante que le informe a su mdico:  La ubicacin del Social research officer, government.  Viene y se va, o persiste todo el tiempo?  Hay situaciones que English as a second language teacher (comer ciertos alimentos, la actividad fsica)?  Tiene otros sntomas asociados al dolor (fiebre, nuseas, vmitos, diarrea)? Todo es de gran ayuda cuando se trata de hallar la causa del dolor. CAUSAS  Estmago: Infecciones por virus o bacterias, o lcera.  Intestino: Apendicitis (apndice inflamado), ileitis regional (enfermedad de Crohn), colitis ulcerosa (colon inflamado), sndrome del colon irritable, diverticulitis (inflamacin de los divertculos del colon) o cncer de estmago oo intestino.  Enfermedades de la vescula biliar o clculos.  Enfermedades renales, clculos o infecciones en el rin.  Infeccin o cncer del pncreas.  Fibromialgia (trastorno doloroso)  Enfermedades de los rganos femeninos:  Uterus: tero: fibroma (tumor no canceroso) o infeccin  Trompas de Falopio: infeccin o embarazo ectpico  En los ovarios, quistes o tumores.  Adherencias plvicas (tejido cicatrizal).  Endometriosis (el tejido que cubre el tero se desarrolla en la pelvis y los rganos plvicos).  Sndrome de Occupational psychologist (los rganos femeninos se llenan de sangre antes del periodo menstrual(  Dolor durante el periodo menstrual.  Dolor durante la ovulacin (al producir vulos).  Dolor al usar el DIU (dispositivo intrauterino para el control de la natalidad)  Programmer, systems los rganos femeninos.  Dolor funcional (no est originado en una enfermedad, puede mejorar sin tratamiento).  Dolor de origen psicolgico  Depresin. DIAGNSTICO Su mdico decidir la gravedad del dolor a travs del examen fsico  Anlisis de sangre  Radiografas  Ecografas  TC (tomografa computada, tipo  especial de radiografas).  IMR (resonancia magntica)  Cultivos, en el caso una infeccin  Colon por enema de bario (se inserta una sustancia de contraste en el intestino grueso para mejorar la observacin con rayos X.)  Colonoscopa (observacin del intestino con un tubo luminoso).  Laparoscopa (examen del interior del abdomen con un tubo que tiene [employer]).  Ciruga exploratoria abdominal mayor (se observa el abdomen realizando una gran incisin). TRATAMIENTO El tratamiento depender de la causa del problema.   Muchos de estos casos pueden controlarse y tratarse en casa.  Medicamentos de venta libre indicados por el mdico.  Medicamentos con receta.  Antibiticos, en caso de infeccin  Pldoras anticonceptivas, en el caso de perodos dolorosos o dolor al ovular.  Tratamiento hormonal, para la endometriosis  Inyecciones para bloqueo nervioso selectivo.  Fisioterapia.  Antidepresivos.  Consejos por parte de un psclogo o psiquiatra.  Ciruga mayor o menor. INSTRUCCIONES PARA EL CUIDADO DOMICILIARIO  No tome ni administre laxantes a menos que se lo haya indicado su mdico.  Tome analgsicos de venta libre slo si se lo ha indicado el profesional que lo asiste. No tome aspirina, ya que puede causar [employer] o hemorragias.  Consuma una dieta lquida (caldo o agua) segn lo indicado por el mdico. Progrese lentamente a una dieta blanda, segn la tolerancia, si el dolor se relaciona con el estmago o el intestino.  Tenga un termmetro y tmese la temperatura varias veces al da.  Haga reposo en la cama y [place], si esto Ship broker.  Evite las relaciones sexuales, Higher education careers adviser.  Evite las situaciones estresantes.  Cumpla con las visitas y los anlisis de control, segn las indicaciones de su mdico.  Si el dolor  no se [employer] o la [place], [place] tratar con:  Acupuntura.  Ejercicios de relajacin (yoga,  meditacin).  Terapia grupal.  Psicoterapia. SOLICITE ATENCIN MDICA SI:  Nota que ciertos Writer de [employer].  El tratamiento indicado para Lexicographer no Engineer, civil (consulting).  Necesita analgsicos ms fuertes.  Quiere que le retiren el DIU.  Si se siente confundido o desfalleciente.  Presenta nuseas o vmitos.  Aparece una erupcin cutnea.  Sufre efectos adversos o una reaccin alrgica debido a los medicamentos que toma. SOLICITE ATENCIN MDICA DE INMEDIATO SI:  El dolor persiste o se agrava.  Tiene fiebre.  Siente el dolor slo en algunos sectores del abdomen. Si se localiza en la zona derecha, posiblemente podra tratarse de apendicitis. En un adulto, si se localiza en la regin inferior izquierda del abdomen, podra tratarse de colitis o diverticulitis.  Hay sangre en las heces (deposiciones de color rojo brillante o negro alquitranado), con o sin vmitos.  Usted presenta sangre en la orina.  Siente escalofros con o sin fiebre.  Se desmaya. ASEGRESE QUE:   Comprende estas instrucciones.  Controlar su enfermedad.  Solicitar ayuda de inmediato si no mejora o si empeora. Document Released: 04/01/2009 Document Revised: 03/07/2012 [facility] Patient Information 2015 [place]. This information is not intended to replace advice given to you by your health care provider. Make sure you discuss any questions you have with your health care provider.

## 2015-10-04 ENCOUNTER — Other Ambulatory Visit: Payer: Self-pay | Admitting: *Deleted

## 2015-10-04 DIAGNOSIS — T82898A Other specified complication of vascular prosthetic devices, implants and grafts, initial encounter: Secondary | ICD-10-CM

## 2015-10-15 DIAGNOSIS — Z992 Dependence on renal dialysis: Secondary | ICD-10-CM | POA: Insufficient documentation

## 2015-10-18 ENCOUNTER — Encounter: Payer: Self-pay | Admitting: Vascular Surgery

## 2015-10-21 ENCOUNTER — Ambulatory Visit (HOSPITAL_COMMUNITY)
Admission: RE | Admit: 2015-10-21 | Discharge: 2015-10-21 | Disposition: A | Discharge status: Home / Self Care | Payer: No Typology Code available for payment source | Source: Ambulatory Visit | Attending: Surgery | Admitting: Surgery

## 2015-10-21 DIAGNOSIS — Y832 Surgical operation with anastomosis, bypass or graft as the cause of abnormal reaction of the patient, or of later complication, without mention of misadventure at the time of the procedure: Secondary | ICD-10-CM | POA: Insufficient documentation

## 2015-10-21 DIAGNOSIS — T82898A Other specified complication of vascular prosthetic devices, implants and grafts, initial encounter: Secondary | ICD-10-CM | POA: Insufficient documentation

## 2015-10-23 ENCOUNTER — Encounter: Payer: Self-pay | Admitting: *Deleted

## 2015-10-23 ENCOUNTER — Encounter: Payer: Self-pay | Admitting: Vascular Surgery

## 2015-10-23 ENCOUNTER — Ambulatory Visit (INDEPENDENT_AMBULATORY_CARE_PROVIDER_SITE_OTHER): Payer: No Typology Code available for payment source | Admitting: Vascular Surgery

## 2015-10-23 ENCOUNTER — Other Ambulatory Visit: Payer: Self-pay | Admitting: *Deleted

## 2015-10-23 VITALS — BP 139/78 | HR 64 | Temp 98.2°F | Resp 16 | Ht 59.5 in | Wt 140.0 lb

## 2015-10-23 DIAGNOSIS — T82510A Breakdown (mechanical) of surgically created arteriovenous fistula, initial encounter: Secondary | ICD-10-CM | POA: Insufficient documentation

## 2015-10-23 DIAGNOSIS — T82898A Other specified complication of vascular prosthetic devices, implants and grafts, initial encounter: Secondary | ICD-10-CM | POA: Insufficient documentation

## 2015-10-23 DIAGNOSIS — Z992 Dependence on renal dialysis: Secondary | ICD-10-CM

## 2015-10-23 DIAGNOSIS — N186 End stage renal disease: Secondary | ICD-10-CM

## 2015-10-23 NOTE — Progress Notes (Signed)
Established Dialysis Access  History of Present Illness  Sabrina Mitchell is a 54 y.o. (1961/02/07) female who presents for re-evaluation for permanent access.  The patient is right hand dominant.  Previous access procedures have been completed in the left arm.  The patient's complication from previous access procedures include: bleeding.  The patient has never had a previous PPM placed.  Pt has had prior venoplasty of L Cephalic vein in her BC AVF.  She is referred her due to concerns due to her L BC AVF bleeding.  Past Medical History  Diagnosis Date  . Hypertension   . Renal disorder   . End stage renal disease ([place])     hemodialysis 3x/wk  . Anemia   . UTI (lower urinary tract infection) March 2014    Past Surgical History  Procedure Laterality Date  . Tubal ligation  2004  . Av fistula placement Left 07/05/09    Dr. Kellie Simmering  . Fistulogram N/A 10/22/2014    Procedure: FISTULOGRAM;  Surgeon: Angelia Mould, MD;  Location: [facility] C[facility] LAB;  Service: Cardiovascular;  Laterality: N/A;    Social History   Social History  . Marital Status: Single    Spouse Name: N/A  . Number of Children: N/A  . Years of Education: N/A   Occupational History  . Not on file.   Social History Main Topics  . Smoking status: Never Smoker   . Smokeless tobacco: Never Used  . Alcohol Use: No  . Drug Use: No  . Sexual Activity: Not Currently    Birth Control/ Protection: Surgical   Other Topics Concern  . Not on file   Social History Narrative    Family History  Problem Relation Age of Onset  . Hypertension Mother   . Hypertension Father   . Hypertension Sister   . Diabetes Brother   . Hypertension Brother     Current Outpatient Prescriptions  Medication Sig Dispense Refill  . amLODipine (NORVASC) 5 MG tablet Take 5 mg by mouth daily.    . carvedilol (COREG) 6.25 MG tablet Take 6.25 mg by mouth 2 (two) times daily with a meal.    . cinacalcet (SENSIPAR) 30 MG tablet Take  30 mg by mouth daily.    . folic acid-vitamin b complex-vitamin c-selenium-zinc (DIALYVITE) 3 MG TABS tablet Take 1 tablet by mouth daily.    Marland Kitchen omeprazole (PRILOSEC) 20 MG capsule Take 1 capsule (20 mg total) by mouth daily. 30 capsule 0  . oxyCODONE-acetaminophen (ROXICET) 5-325 MG per tablet Take 1-2 tablets by mouth every 4 (four) hours as needed for severe pain. (Patient not taking: Reported on 10/23/2015) 20 tablet 0   No current facility-administered medications for this visit.     No Known Allergies   REVIEW OF SYSTEMS:  (Positives checked otherwise negative)  CARDIOVASCULAR:   [ ]  chest pain,  [ ]  chest pressure,  [ ]  palpitations,  [ ]  shortness of breath when laying flat,  [ ]  shortness of breath with exertion,   [ ]  pain in feet when walking,  [ ]  pain in feet when laying flat, [ ]  history of blood clot in veins (DVT),  [ ]  history of phlebitis,  [ ]  swelling in legs,  [ ]  varicose veins  PULMONARY:   [ ]  productive cough,  [ ]  asthma,  [ ]  wheezing  NEUROLOGIC:   [ ]  weakness in arms or legs,  [ ]  numbness in arms or legs,  [ ]   difficulty speaking or slurred speech,  [ ]  temporary loss of vision in one eye,  [ ]  dizziness  HEMATOLOGIC:   [ ]  bleeding problems,  [ ]  problems with blood clotting too easily  MUSCULOSKEL:   [ ]  joint pain, [ ]  joint swelling  GASTROINTEST:   [ ]  vomiting blood,  [ ]  blood in stool     GENITOURINARY:   [ ]  burning with urination,  [ ]  blood in urine [x]  ESRD-HD: T-R-S  PSYCHIATRIC:   [ ]  history of major depression  INTEGUMENTARY:   [ ]  rashes,  [ ]  ulcers  CONSTITUTIONAL:   [ ]  fever,  [ ]  chills   Physical Examination  Filed Vitals:   10/23/15 0927  BP: 139/78  Pulse: 64  Temp: 98.2 F (36.8 C)  TempSrc: Oral  Resp: 16  Height: 4' 11.5" (1.511 m)  Weight: 140 lb (63.504 kg)  SpO2: 100%   Body mass index is 27.81 kg/(m^2).  General: A&O x 3, WD, WN  Pulmonary: Sym exp, good air movt, CTAB,  no rales, rhonchi, & wheezing  Cardiac: RRR, Nl S1, S2, no Murmurs, rubs or gallops  Vascular: Vessel Right Left  Radial Palpable Palpable  Ulnar Not Palpable Not Palpable  Brachial Palpable Palpable   Gastrointestinal: soft, NTND, no G/R, bo HSM, no masses, no CVAT B  Musculoskeletal: M/S 5/5 throughout , Extremities without  ischemic changes , palpable thrill in access in L upper arm, + normal bruit in access, thinned out area of skin overlying moderate size pseudoaneurysm in L BC AVF  Neurologic: Pain and light touch intact in extremities , Motor exam as listed above   Medical Decision Making  Sabrina T Corky Crafts is a 54 y.o. female who presents with ESRD requiring hemodialysis, L BC AVF PSA, possible recurrent venous stenosis   Given prior bleeding complications and thinned out segment of the fistula, I recommend: L arm fistulogram, possible venoplasty, plication of L BC AVF.  Risk, benefits, and alternatives to access surgery were discussed.  The patient is aware the risks include but are not limited to: bleeding, infection, steal syndrome, nerve damage, ischemic monomelic neuropathy, failure to mature, need for additional procedures, death and stroke.    I discussed with the patient the nature of angiographic procedures, especially the limited patencies of any endovascular intervention.  The patient is aware of that the risks of an angiographic procedure include but are not limited to: bleeding, infection, access site complications, renal failure, embolization, rupture of vessel, dissection, possible need for emergent surgical intervention, possible need for surgical procedures to treat the patient's pathology, anaphylactic reaction to contrast, and stroke and death.    The patient agrees to proceed forward with the procedure.  The patient has agreed to proceed with the above procedure which will be scheduled 31 OCT 16.   Adele Barthel, MD [facility]  [place] Office: [phone] Pager: [phone]  10/23/2015, 11:39 AM

## 2015-10-25 NOTE — Progress Notes (Signed)
Unable to reach pt by phone. Left pre-op instructions on voicemail per [facility] [phone]). Instructed pt to arrive at 5:30 AM. NPO after MN Sunday night, to take Amlodipine, Carvedilol and Omeprazole with a small sip of water. Left number to Short Stay for pt to call Monday AM with any questions.

## 2015-10-27 MED ORDER — DEXTROSE 5 % IV SOLN
1.5000 g | INTRAVENOUS | Status: AC
  Administered 2015-10-28: 1.5 g via INTRAVENOUS
  Filled 2015-10-27: qty 1.5

## 2015-10-28 ENCOUNTER — Ambulatory Visit (HOSPITAL_COMMUNITY)
Admission: RE | Admit: 2015-10-28 | Discharge: 2015-10-28 | Disposition: A | Discharge status: Home / Self Care | Payer: Self-pay | Source: Ambulatory Visit | Attending: Vascular Surgery | Admitting: Vascular Surgery

## 2015-10-28 ENCOUNTER — Ambulatory Visit (HOSPITAL_COMMUNITY): Payer: Self-pay | Admitting: Anesthesiology

## 2015-10-28 ENCOUNTER — Ambulatory Visit (HOSPITAL_COMMUNITY): Payer: No Typology Code available for payment source | Admitting: Anesthesiology

## 2015-10-28 ENCOUNTER — Encounter (HOSPITAL_COMMUNITY): Payer: Self-pay | Admitting: *Deleted

## 2015-10-28 ENCOUNTER — Encounter (HOSPITAL_COMMUNITY)
Admission: RE | Disposition: A | Discharge status: Home / Self Care | Payer: Self-pay | Source: Ambulatory Visit | Attending: Vascular Surgery

## 2015-10-28 DIAGNOSIS — D649 Anemia, unspecified: Secondary | ICD-10-CM | POA: Insufficient documentation

## 2015-10-28 DIAGNOSIS — Z79899 Other long term (current) drug therapy: Secondary | ICD-10-CM | POA: Insufficient documentation

## 2015-10-28 DIAGNOSIS — Z992 Dependence on renal dialysis: Secondary | ICD-10-CM | POA: Insufficient documentation

## 2015-10-28 DIAGNOSIS — Y832 Surgical operation with anastomosis, bypass or graft as the cause of abnormal reaction of the patient, or of later complication, without mention of misadventure at the time of the procedure: Secondary | ICD-10-CM | POA: Insufficient documentation

## 2015-10-28 DIAGNOSIS — T82898A Other specified complication of vascular prosthetic devices, implants and grafts, initial encounter: Secondary | ICD-10-CM

## 2015-10-28 DIAGNOSIS — I12 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 disease: Secondary | ICD-10-CM | POA: Insufficient documentation

## 2015-10-28 DIAGNOSIS — K219 Gastro-esophageal reflux disease without esophagitis: Secondary | ICD-10-CM | POA: Insufficient documentation

## 2015-10-28 DIAGNOSIS — N186 End stage renal disease: Secondary | ICD-10-CM | POA: Insufficient documentation

## 2015-10-28 HISTORY — DX: Gastro-esophageal reflux disease without esophagitis: K21.9

## 2015-10-28 HISTORY — DX: Gastritis, unspecified, without bleeding: K29.70

## 2015-10-28 HISTORY — DX: Family history of other specified conditions: Z84.89

## 2015-10-28 HISTORY — PX: REVISON OF ARTERIOVENOUS FISTULA: SHX6074

## 2015-10-28 LAB — POCT I-STAT 4, (NA,K, GLUC, HGB,HCT)
GLUCOSE: 88 mg/dL (ref 65–99)
HEMATOCRIT: 31 % — AB (ref 36.0–46.0)
Hemoglobin: 10.5 g/dL — ABNORMAL LOW (ref 12.0–15.0)
POTASSIUM: 3.4 mmol/L — AB (ref 3.5–5.1)
Sodium: 137 mmol/L (ref 135–145)

## 2015-10-28 SURGERY — REVISON OF ARTERIOVENOUS FISTULA
Anesthesia: General | Site: Arm Upper | Laterality: Left

## 2015-10-28 MED ORDER — LIDOCAINE HCL (PF) 1 % IJ SOLN
INTRAMUSCULAR | Status: DC | PRN
  Administered 2015-10-28: 10 mL

## 2015-10-28 MED ORDER — HYDROMORPHONE HCL 1 MG/ML IJ SOLN
INTRAMUSCULAR | Status: AC
  Administered 2015-10-28: 0.5 mg via INTRAVENOUS
  Filled 2015-10-28: qty 1

## 2015-10-28 MED ORDER — ONDANSETRON HCL 4 MG/2ML IJ SOLN
4.0000 mg | Freq: Once | INTRAMUSCULAR | Status: AC | PRN
  Administered 2015-10-28: 4 mg via INTRAVENOUS

## 2015-10-28 MED ORDER — THROMBIN 20000 UNITS EX SOLR
CUTANEOUS | Status: DC | PRN
  Administered 2015-10-28: 08:00:00 via TOPICAL

## 2015-10-28 MED ORDER — HYDROMORPHONE HCL 1 MG/ML IJ SOLN
0.2500 mg | INTRAMUSCULAR | Status: DC | PRN
  Administered 2015-10-28 (×2): 0.5 mg via INTRAVENOUS

## 2015-10-28 MED ORDER — OXYCODONE HCL 5 MG PO TABS: 5.0000 mg | ORAL_TABLET | Freq: Four times a day (QID) | ORAL | Status: DC | PRN

## 2015-10-28 MED ORDER — PROPOFOL 10 MG/ML IV BOLUS
INTRAVENOUS | Status: AC
  Filled 2015-10-28: qty 20

## 2015-10-28 MED ORDER — THROMBIN 20000 UNITS EX SOLR
CUTANEOUS | Status: AC
  Filled 2015-10-28: qty 20000

## 2015-10-28 MED ORDER — PROPOFOL 10 MG/ML IV BOLUS
INTRAVENOUS | Status: DC | PRN
  Administered 2015-10-28: 30 mg via INTRAVENOUS
  Administered 2015-10-28: 120 mg via INTRAVENOUS

## 2015-10-28 MED ORDER — SODIUM CHLORIDE 0.9 % IV SOLN: INTRAVENOUS | Status: DC

## 2015-10-28 MED ORDER — ONDANSETRON HCL 4 MG/2ML IJ SOLN
INTRAMUSCULAR | Status: AC
  Filled 2015-10-28: qty 2

## 2015-10-28 MED ORDER — FENTANYL CITRATE (PF) 250 MCG/5ML IJ SOLN
INTRAMUSCULAR | Status: AC
  Filled 2015-10-28: qty 5

## 2015-10-28 MED ORDER — GLYCOPYRROLATE 0.2 MG/ML IJ SOLN
INTRAMUSCULAR | Status: DC | PRN
  Administered 2015-10-28: 0.2 mg via INTRAVENOUS

## 2015-10-28 MED ORDER — LIDOCAINE HCL (PF) 1 % IJ SOLN
INTRAMUSCULAR | Status: AC
  Filled 2015-10-28: qty 30

## 2015-10-28 MED ORDER — MEPERIDINE HCL 25 MG/ML IJ SOLN: 6.2500 mg | INTRAMUSCULAR | Status: DC | PRN

## 2015-10-28 MED ORDER — FENTANYL CITRATE (PF) 250 MCG/5ML IJ SOLN
INTRAMUSCULAR | Status: DC | PRN
  Administered 2015-10-28: 50 ug via INTRAVENOUS

## 2015-10-28 MED ORDER — PHENYLEPHRINE HCL 10 MG/ML IJ SOLN
INTRAMUSCULAR | Status: DC | PRN
  Administered 2015-10-28: 80 ug via INTRAVENOUS

## 2015-10-28 MED ORDER — EPHEDRINE SULFATE 50 MG/ML IJ SOLN
INTRAMUSCULAR | Status: DC | PRN
  Administered 2015-10-28 (×2): 20 mg via INTRAVENOUS
  Administered 2015-10-28: 10 mg via INTRAVENOUS

## 2015-10-28 MED ORDER — SODIUM CHLORIDE 0.9 % IV SOLN
INTRAVENOUS | Status: DC | PRN
  Administered 2015-10-28: 08:00:00

## 2015-10-28 MED ORDER — 0.9 % SODIUM CHLORIDE (POUR BTL) OPTIME
TOPICAL | Status: DC | PRN
  Administered 2015-10-28: 1000 mL

## 2015-10-28 MED ORDER — PROTAMINE SULFATE 10 MG/ML IV SOLN
INTRAVENOUS | Status: DC | PRN
  Administered 2015-10-28: 30 mg via INTRAVENOUS

## 2015-10-28 MED ORDER — HEPARIN SODIUM (PORCINE) 1000 UNIT/ML IJ SOLN
INTRAMUSCULAR | Status: DC | PRN
  Administered 2015-10-28: 6000 [IU] via INTRAVENOUS

## 2015-10-28 MED ORDER — ONDANSETRON HCL 4 MG/2ML IJ SOLN
INTRAMUSCULAR | Status: DC | PRN
  Administered 2015-10-28: 4 mg via INTRAVENOUS

## 2015-10-28 MED ORDER — CHLORHEXIDINE GLUCONATE CLOTH 2 % EX PADS: 6.0000 | MEDICATED_PAD | Freq: Once | CUTANEOUS | Status: DC

## 2015-10-28 MED ORDER — SODIUM CHLORIDE 0.9 % IV SOLN
INTRAVENOUS | Status: DC
  Administered 2015-10-28: 07:00:00 via INTRAVENOUS

## 2015-10-28 MED ORDER — MIDAZOLAM HCL 2 MG/2ML IJ SOLN
INTRAMUSCULAR | Status: AC
  Filled 2015-10-28: qty 4

## 2015-10-28 MED ORDER — MIDAZOLAM HCL 5 MG/5ML IJ SOLN
INTRAMUSCULAR | Status: DC | PRN
  Administered 2015-10-28: 2 mg via INTRAVENOUS

## 2015-10-28 SURGICAL SUPPLY — 35 items
BANDAGE ESMARK 6X9 LF (GAUZE/BANDAGES/DRESSINGS) IMPLANT
BNDG CMPR 9X6 STRL LF SNTH (GAUZE/BANDAGES/DRESSINGS) ×1
BNDG ESMARK 6X9 LF (GAUZE/BANDAGES/DRESSINGS) ×2
CANISTER SUCTION 2500CC (MISCELLANEOUS) ×2 IMPLANT
CLIP TI MEDIUM 6 (CLIP) ×2 IMPLANT
CLIP TI WIDE RED SMALL 6 (CLIP) ×2 IMPLANT
COVER PROBE W GEL 5X96 (DRAPES) IMPLANT
CUFF TOURNIQUET SINGLE 18IN (TOURNIQUET CUFF) ×1 IMPLANT
DECANTER SPIKE VIAL GLASS SM (MISCELLANEOUS) ×2 IMPLANT
DRAIN PENROSE 1/2X12 LTX STRL (WOUND CARE) IMPLANT
ELECT REM PT RETURN 9FT ADLT (ELECTROSURGICAL) ×2
ELECTRODE REM PT RTRN 9FT ADLT (ELECTROSURGICAL) ×1 IMPLANT
GLOVE BIO SURGEON STRL SZ 6.5 (GLOVE) ×2 IMPLANT
GLOVE BIO SURGEON STRL SZ7 (GLOVE) ×2 IMPLANT
GLOVE BIOGEL PI IND STRL 7.5 (GLOVE) ×1 IMPLANT
GLOVE BIOGEL PI IND STRL 8 (GLOVE) IMPLANT
GLOVE BIOGEL PI INDICATOR 7.5 (GLOVE) ×1
GLOVE BIOGEL PI INDICATOR 8 (GLOVE) ×1
GOWN STRL REUS W/ TWL LRG LVL3 (GOWN DISPOSABLE) ×3 IMPLANT
GOWN STRL REUS W/TWL LRG LVL3 (GOWN DISPOSABLE) ×6
KIT BASIN OR (CUSTOM PROCEDURE TRAY) ×2 IMPLANT
KIT ROOM TURNOVER OR (KITS) ×2 IMPLANT
LIQUID BAND (GAUZE/BANDAGES/DRESSINGS) ×2 IMPLANT
NS IRRIG 1000ML POUR BTL (IV SOLUTION) ×2 IMPLANT
PACK CV ACCESS (CUSTOM PROCEDURE TRAY) ×2 IMPLANT
PAD ARMBOARD 7.5X6 YLW CONV (MISCELLANEOUS) ×4 IMPLANT
SPONGE SURGIFOAM ABS GEL 100 (HEMOSTASIS) IMPLANT
SUT MNCRL AB 4-0 PS2 18 (SUTURE) ×2 IMPLANT
SUT PROLENE 5 0 C 1 24 (SUTURE) ×2 IMPLANT
SUT PROLENE 6 0 BV (SUTURE) IMPLANT
SUT PROLENE 7 0 BV 1 (SUTURE) ×2 IMPLANT
SUT VIC AB 3-0 SH 27 (SUTURE) ×2
SUT VIC AB 3-0 SH 27X BRD (SUTURE) ×1 IMPLANT
UNDERPAD 30X30 INCONTINENT (UNDERPADS AND DIAPERS) ×2 IMPLANT
WATER STERILE IRR 1000ML POUR (IV SOLUTION) ×2 IMPLANT

## 2015-10-28 NOTE — Anesthesia Procedure Notes (Signed)
Procedure Name: LMA Insertion Date/Time: 10/28/2015 7:37 AM Performed by: Scheryl Darter Pre-anesthesia Checklist: Patient identified, Emergency Drugs available, Suction available, Patient being monitored and Timeout performed Patient Re-evaluated:Patient Re-evaluated prior to inductionOxygen Delivery Method: Circle system utilized Preoxygenation: Pre-oxygenation with 100% oxygen Intubation Type: IV induction Ventilation: Mask ventilation without difficulty LMA: LMA inserted LMA Size: 4.0 Number of attempts: 1 Placement Confirmation: ETT inserted through vocal cords under direct vision and positive ETCO2 Tube secured with: Tape

## 2015-10-28 NOTE — Transfer of Care (Signed)
Immediate Anesthesia Transfer of Care Note  Patient: Sabrina Mitchell  Procedure(s) Performed: Procedure(s): PLICATION OF BRACHIOCEPHALIC FISTULA (Left)  Patient Location: PACU  Anesthesia Type:General  Level of Consciousness: awake, alert , oriented and sedated  Airway & Oxygen Therapy: Patient Spontanous Breathing and Patient connected to nasal cannula oxygen  Post-op Assessment: Report given to RN, Post -op Vital signs reviewed and stable and Patient moving all extremities  Post vital signs: Reviewed and stable  Last Vitals:  Filed Vitals:   10/28/15 0616  BP: 138/79  Pulse: 75  Temp: 37.3 C  Resp: 16    Complications: No apparent anesthesia complications

## 2015-10-28 NOTE — Interval H&P Note (Signed)
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 After consideration of risks, benefits and other options for treatment, the patient has consented to  Procedure(s): PLICATION OF BRACHIOCEPHALIC FISTULA (Left) as a surgical intervention .  The patient's history has been reviewed, patient examined, no change in status, stable for surgery.  I have reviewed the patient's chart and labs.  Questions were answered to the patient's satisfaction.     Adele Barthel

## 2015-10-28 NOTE — H&P (View-Only) (Signed)
Established Dialysis Access  History of Present Illness  Sabrina Mitchell is a 54 y.o. (09-Apr-1961) female who presents for re-evaluation for permanent access.  The patient is right hand dominant.  Previous access procedures have been completed in the left arm.  The patient's complication from previous access procedures include: bleeding.  The patient has never had a previous PPM placed.  Pt has had prior venoplasty of L Cephalic vein in her BC AVF.  She is referred her due to concerns due to her L BC AVF bleeding.  Past Medical History  Diagnosis Date  . Hypertension   . Renal disorder   . End stage renal disease ([place])     hemodialysis 3x/wk  . Anemia   . UTI (lower urinary tract infection) March 2014    Past Surgical History  Procedure Laterality Date  . Tubal ligation  2004  . Av fistula placement Left 07/05/09    Dr. Kellie Simmering  . Fistulogram N/A 10/22/2014    Procedure: FISTULOGRAM;  Surgeon: Angelia Mould, MD;  Location: [facility] CATH LAB;  Service: Cardiovascular;  Laterality: N/A;    Social History   Social History  . Marital Status: Single    Spouse Name: N/A  . Number of Children: N/A  . Years of Education: N/A   Occupational History  . Not on file.   Social History Main Topics  . Smoking status: Never Smoker   . Smokeless tobacco: Never Used  . Alcohol Use: No  . Drug Use: No  . Sexual Activity: Not Currently    Birth Control/ Protection: Surgical   Other Topics Concern  . Not on file   Social History Narrative    Family History  Problem Relation Age of Onset  . Hypertension Mother   . Hypertension Father   . Hypertension Sister   . Diabetes Brother   . Hypertension Brother     Current Outpatient Prescriptions  Medication Sig Dispense Refill  . amLODipine (NORVASC) 5 MG tablet Take 5 mg by mouth daily.    . carvedilol (COREG) 6.25 MG tablet Take 6.25 mg by mouth 2 (two) times daily with a meal.    . cinacalcet (SENSIPAR) 30 MG tablet Take  30 mg by mouth daily.    . folic acid-vitamin b complex-vitamin c-selenium-zinc (DIALYVITE) 3 MG TABS tablet Take 1 tablet by mouth daily.    Marland Kitchen omeprazole (PRILOSEC) 20 MG capsule Take 1 capsule (20 mg total) by mouth daily. 30 capsule 0  . oxyCODONE-acetaminophen (ROXICET) 5-325 MG per tablet Take 1-2 tablets by mouth every 4 (four) hours as needed for severe pain. (Patient not taking: Reported on 10/23/2015) 20 tablet 0   No current facility-administered medications for this visit.     No Known Allergies   REVIEW OF SYSTEMS:  (Positives checked otherwise negative)  CARDIOVASCULAR:   [ ]  chest pain,  [ ]  chest pressure,  [ ]  palpitations,  [ ]  shortness of breath when laying flat,  [ ]  shortness of breath with exertion,   [ ]  pain in feet when walking,  [ ]  pain in feet when laying flat, [ ]  history of blood clot in veins (DVT),  [ ]  history of phlebitis,  [ ]  swelling in legs,  [ ]  varicose veins  PULMONARY:   [ ]  productive cough,  [ ]  asthma,  [ ]  wheezing  NEUROLOGIC:   [ ]  weakness in arms or legs,  [ ]  numbness in arms or legs,  [ ]   difficulty speaking or slurred speech,  [ ]  temporary loss of vision in one eye,  [ ]  dizziness  HEMATOLOGIC:   [ ]  bleeding problems,  [ ]  problems with blood clotting too easily  MUSCULOSKEL:   [ ]  joint pain, [ ]  joint swelling  GASTROINTEST:   [ ]  vomiting blood,  [ ]  blood in stool     GENITOURINARY:   [ ]  burning with urination,  [ ]  blood in urine [x]  ESRD-HD: T-R-S  PSYCHIATRIC:   [ ]  history of major depression  INTEGUMENTARY:   [ ]  rashes,  [ ]  ulcers  CONSTITUTIONAL:   [ ]  fever,  [ ]  chills   Physical Examination  Filed Vitals:   10/23/15 0927  BP: 139/78  Pulse: 64  Temp: 98.2 F (36.8 C)  TempSrc: Oral  Resp: 16  Height: 4' 11.5" (1.511 m)  Weight: 140 lb (63.504 kg)  SpO2: 100%   Body mass index is 27.81 kg/(m^2).  General: A&O x 3, WD, WN  Pulmonary: Sym exp, good air movt, CTAB,  no rales, rhonchi, & wheezing  Cardiac: RRR, Nl S1, S2, no Murmurs, rubs or gallops  Vascular: Vessel Right Left  Radial Palpable Palpable  Ulnar Not Palpable Not Palpable  Brachial Palpable Palpable   Gastrointestinal: soft, NTND, no G/R, bo HSM, no masses, no CVAT B  Musculoskeletal: M/S 5/5 throughout , Extremities without  ischemic changes , palpable thrill in access in L upper arm, + normal bruit in access, thinned out area of skin overlying moderate size pseudoaneurysm in L BC AVF  Neurologic: Pain and light touch intact in extremities , Motor exam as listed above   Medical Decision Making  Sabrina Mitchell is a 54 y.o. female who presents with ESRD requiring hemodialysis, L BC AVF PSA, possible recurrent venous stenosis   Given prior bleeding complications and thinned out segment of the fistula, I recommend: L arm fistulogram, possible venoplasty, plication of L BC AVF.  Risk, benefits, and alternatives to access surgery were discussed.  The patient is aware the risks include but are not limited to: bleeding, infection, steal syndrome, nerve damage, ischemic monomelic neuropathy, failure to mature, need for additional procedures, death and stroke.    I discussed with the patient the nature of angiographic procedures, especially the limited patencies of any endovascular intervention.  The patient is aware of that the risks of an angiographic procedure include but are not limited to: bleeding, infection, access site complications, renal failure, embolization, rupture of vessel, dissection, possible need for emergent surgical intervention, possible need for surgical procedures to treat the patient's pathology, anaphylactic reaction to contrast, and stroke and death.    The patient agrees to proceed forward with the procedure.  The patient has agreed to proceed with the above procedure which will be scheduled 31 OCT 16.   Adele Barthel, MD [facility]  [place] Office: [phone] Pager: [phone]  10/23/2015, 11:39 AM

## 2015-10-28 NOTE — Anesthesia Postprocedure Evaluation (Signed)
Anesthesia Post Note  Patient: Sabrina Mitchell  Procedure(s) Performed: Procedure(s) (LRB): PLICATION OF BRACHIOCEPHALIC FISTULA (Left)  Anesthesia type: general  Patient location: PACU  Post pain: Pain level controlled  Post assessment: Patient's Cardiovascular Status Stable  Last Vitals:  Filed Vitals:   10/28/15 0939  BP:   Pulse:   Temp: 36.3 C  Resp:     Post vital signs: Reviewed and stable  Level of consciousness: sedated  Complications: No apparent anesthesia complications

## 2015-10-28 NOTE — Progress Notes (Signed)
Interpreter Sabrina Mitchell for pre surgery

## 2015-10-28 NOTE — Discharge Instructions (Signed)
° ° °  10/28/2015 Sabrina Lame [id number] 02/01/1961  Surgeon(s): Conrad Wildwood, MD  Procedure(s): PLICATION OF BRACHIOCEPHALIC FISTULA-left arm  x May stick graft on designated area only:  May stick above and below incision-do not stick over incision until patient has been seen back by Dr. Bridgett Larsson. SEE DIAGRAM DRAWING.

## 2015-10-28 NOTE — Op Note (Signed)
    OPERATIVE NOTE   PROCEDURE: 1. Plication of left brachiocephalic arteriovenous fistula   PRE-OPERATIVE DIAGNOSIS: Pseudoaneurysm degeneration of arteriovenous fistula   POST-OPERATIVE DIAGNOSIS: same as above   SURGEON: Adele Barthel, MD  ASSISTANT(S): Leontine Locket, PAC   ANESTHESIA: general  ESTIMATED BLOOD LOSS: 50 cc  FINDING(S): 1. Moderate size multichamber pseudoaneurysm with ulceration overlying 2. Palpable thrill at end of the case  SPECIMEN(S):  none  INDICATIONS:   Sabrina Mitchell is a 54 y.o. 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  The patient had developed an ulcer overlying the pseudoaneurysm and had extended bleeding from this area.  In order to salvage the fistula and decrease the bleeding complication risks, I recommended plication of the fistula.  Risk, benefits, and alternatives to access surgery were discussed.  The patient is aware the risks include but are not limited to: bleeding, infection, steal syndrome, nerve damage, ischemic monomelic neuropathy, failure to mature, need for additional procedures, death and stroke.  The patient agrees to proceed forward with the procedure.  DESCRIPTION: After obtaining full informed written consent, the patient was brought back to the operating room and placed supine upon the operating table.  The patient received IV antibiotics prior to induction.  After obtaining adequate anesthesia, the patient was prepped and draped in the standard fashion for: left access procedure.  The patient was given 6000 units of Heparin intravenously, which was a therapeutic bolus.  After waiting 5 minutes, a sterile tourniquet was applied to the upper arm and inflated to 250 mm Hg after exsanguinating the arm with an Esmark bandage.  A total of 10 minutes of tourniquet time was utilized.   An elliptical incision was made around the skin overlying the pseudoaneurysmal  segment in the fistula, taking care to resect the ulcerated skin.  I dissected the skin away from the pseudoaneurysm with electrocautery.  Eventually, I was able to skeletonize the pseudoaneurysm.  I sharply entered into the pseudoaneurysm to verify the position of the pseudoaneurysm.  The residual fistula wall was felt to be adequately strong to repair.  I sharply tailored the pseudoaneurysm wall and repaired the fistula with a running stitch of 5-0 Prolene, taking care to take large bites of the fistula wall.  Prior to completing this repair, I pass 5 mm dilator proximally and distally: no resistance was encountered.  The repair was completed in the usual fashion.  At this point, the patient was given 30 mg of Protamine intravenously to reverse the anticoagulation.  Thrombin and gelfoam were applied to the incision.  Bleeding points were controlled with suture ligature and electrocautery.  After a few more round of thrombin and gelfoam, no further active bleeding was noted.  I reapproximated the subcutaneous tissue immediately above the fistula with a running stitch of 3-0 Vicryl.  The skin was reapproximated with a running subcuticular of 4-0 Monocryl.  The skin was cleaned, dried, and the skin reinforced with Dermabond.  The patient continued to have a palpable thrill in the fistula.   COMPLICATIONS: none  CONDITION: stable   Adele Barthel, MD [facility] [place] Office: [phone] Pager: [phone]  10/28/2015, 8:41 AM

## 2015-10-28 NOTE — Progress Notes (Signed)
Interpreter Lesle Chris for PACU  For RN Diane.

## 2015-10-28 NOTE — Anesthesia Preprocedure Evaluation (Signed)
Anesthesia Evaluation  Patient identified by MRN, date of birth, ID band Patient awake    Reviewed: Allergy & Precautions, NPO status , Patient's Chart, lab work & pertinent test results  Airway Mallampati: I  TM Distance: >3 FB Neck ROM: Full    Dental   Pulmonary    Pulmonary exam normal        Cardiovascular hypertension, Pt. on medications Normal cardiovascular exam     Neuro/Psych    GI/Hepatic GERD  Controlled and Medicated,  Endo/Other    Renal/GU Dialysis and ESRFRenal disease     Musculoskeletal   Abdominal   Peds  Hematology   Anesthesia Other Findings   Reproductive/Obstetrics                             Anesthesia Physical Anesthesia Plan  ASA: III  Anesthesia Plan: General   Post-op Pain Management:    Induction: Intravenous  Airway Management Planned: LMA  Additional Equipment:   Intra-op Plan:   Post-operative Plan: Extubation in OR  Informed Consent: I have reviewed the patients History and Physical, chart, labs and discussed the procedure including the risks, benefits and alternatives for the proposed anesthesia with the patient or authorized representative who has indicated his/her understanding and acceptance.     Plan Discussed with: CRNA and Surgeon  Anesthesia Plan Comments:         Anesthesia Quick Evaluation

## 2015-10-29 ENCOUNTER — Telehealth: Payer: Self-pay | Admitting: Vascular Surgery

## 2015-10-29 ENCOUNTER — Encounter (HOSPITAL_COMMUNITY): Payer: Self-pay | Admitting: Vascular Surgery

## 2015-10-29 NOTE — Telephone Encounter (Signed)
LVM, and mailed letter to home address, dpm

## 2015-10-29 NOTE — Telephone Encounter (Signed)
-----   Message from Denman George, RN sent at 10/28/2015  9:07 AM EDT ----- Regarding: Zigmund Daniel log; also needs 4 wk. f/u with Dr. Bridgett Larsson   ----- Message -----    From: Conrad Maunabo, MD    Sent: 10/28/2015   8:45 AM      To: [facility] [address]  Lucilla Lame [id number] [phone]   PROCEDURE: Plication of left brachiocephalic arteriovenous fistula   Asst: Leontine Locket, PAC   Follow-up: 4 weeks

## 2015-11-13 ENCOUNTER — Ambulatory Visit: Payer: No Typology Code available for payment source | Attending: Family Medicine | Admitting: Family Medicine

## 2015-11-13 ENCOUNTER — Encounter: Payer: Self-pay | Admitting: Family Medicine

## 2015-11-13 VITALS — BP 128/76 | HR 68 | Temp 99.1°F | Resp 18 | Ht 60.0 in | Wt 141.4 lb

## 2015-11-13 DIAGNOSIS — Z1211 Encounter for screening for malignant neoplasm of colon: Secondary | ICD-10-CM

## 2015-11-13 DIAGNOSIS — Z1239 Encounter for other screening for malignant neoplasm of breast: Secondary | ICD-10-CM

## 2015-11-13 DIAGNOSIS — R195 Other fecal abnormalities: Secondary | ICD-10-CM

## 2015-11-13 DIAGNOSIS — I10 Essential (primary) hypertension: Secondary | ICD-10-CM

## 2015-11-13 NOTE — Progress Notes (Signed)
   Subjective:    Patient ID: Sabrina Mitchell, female    DOB: [birth year], 54 y.o.   MRN: [id number]  HPI Mammography: had mammo and breast ct 2-3 years ago, was told was a cyst.  Needs to have screening mammogram, no further masses or nipple discharge.   Colon cancer screening: never had. No blood in stool. No N/V/D, no family hx CRC.   Cervical cancer screening: last done 3 years ago (not in [facility] system).  Was normal per patient report (didn't get report). No history of abnormal pap).  Stopped menses 5 yrs ago when she started HD.  Already had flu shot this this year.   Social Hx: never-smoker.  2 children. LMP about 5 years ago when she began HD.   Review of Systems Denies fevers or chills, no chest pain or dyspnea/cough.  Has had sharp, quick episode of chest pain in the past but do not last and are not accompanied by nausea/diaphoresis.  No blood per rectum. No abnormal mammogram or cervical cancer screening.     Objective:   Physical Exam  Well appearing, no distress.  HEENT: Neck supple, no cervical adenopathy COR Regular [id number], holosystolic M noted over aortic area vs referred sound from L arm HD fistula site.  PULM  Clear bilaterally, no rales or wheezes EXTS: L antecubital area with HD fistula, loud bruit noted with auscultation.  No edema in lower extremities.       Assessment & Plan:

## 2015-11-13 NOTE — Assessment & Plan Note (Signed)
Well controlled today.

## 2015-11-13 NOTE — Progress Notes (Signed)
Patient here for a physical. Patient denies any pain today. Patient reports experiencing stabbing pain off and on in her left arm since surgery about 2 weeks ago. Patient needs referral for a mammogram.

## 2015-11-13 NOTE — Patient Instructions (Addendum)
Fue un [employer] fast streamer.   Para la vigilancia para cancer de colon, le estamos dando 3 tarjetas para hacerse la colecta de excremento (una muestra en cada tarjeta, cada dia por 3 dias seguidos).    Para la vigilancia para cancer de mama; mamografia (ordenada hoy).   Le marcamos otra cita para hacerle el papanicolau.   SCHEDULE FOR PAP SMEAR ONLY VISIT IN MORNING WHEN CONVENIENT FOR PATIENT.

## 2015-11-15 ENCOUNTER — Other Ambulatory Visit: Payer: Self-pay | Admitting: Family Medicine

## 2015-11-15 DIAGNOSIS — Z1231 Encounter for screening mammogram for malignant neoplasm of breast: Secondary | ICD-10-CM

## 2015-11-15 NOTE — Addendum Note (Signed)
Addended by: Sharman Crate A on: 11/15/2015 08:36 AM   Modules accepted: Miquel Dunn

## 2015-11-20 ENCOUNTER — Telehealth (HOSPITAL_BASED_OUTPATIENT_CLINIC_OR_DEPARTMENT_OTHER): Payer: No Typology Code available for payment source | Admitting: *Deleted

## 2015-11-20 ENCOUNTER — Ambulatory Visit: Payer: No Typology Code available for payment source | Attending: Family Medicine

## 2015-11-20 DIAGNOSIS — Z1211 Encounter for screening for malignant neoplasm of colon: Secondary | ICD-10-CM

## 2015-11-20 LAB — HEMOCCULT GUIAC POC 1CARD (OFFICE)
Card #2 Fecal Occult Blod, POC: NEGATIVE
FECAL OCCULT BLD: NEGATIVE
FECAL OCCULT BLD: POSITIVE

## 2015-11-20 NOTE — Telephone Encounter (Signed)
Pt here with stool care samples  Sample tested

## 2015-11-28 DIAGNOSIS — R195 Other fecal abnormalities: Secondary | ICD-10-CM | POA: Insufficient documentation

## 2015-11-28 NOTE — Addendum Note (Signed)
Addended by: Willeen Niece on: 11/28/2015 03:42 PM   Modules accepted: Orders

## 2015-11-29 ENCOUNTER — Encounter: Payer: Self-pay | Admitting: Vascular Surgery

## 2015-12-02 ENCOUNTER — Encounter: Payer: Self-pay | Admitting: Gastroenterology

## 2015-12-03 ENCOUNTER — Telehealth: Payer: Self-pay

## 2015-12-03 NOTE — Telephone Encounter (Signed)
-----   Message from Willeen Niece, MD sent at 11/28/2015  3:44 PM EST ----- Please let patient know that one of her stool cards was positive for blood.  I have entered a referral for GI to evaluate, perform screening test with colonoscopy.  Thanks,  JB

## 2015-12-03 NOTE — Telephone Encounter (Signed)
Nurse called patient via [employer], [place]. Interpreter reached voicemail, interpreter left message for patient to return call to [facility] at [phone]. Nurse called patient to make patient aware  Of stool card being positive for blood. GI referral has been placed for patient to be evaluated and colonoscopy to be performed by GI.

## 2015-12-04 ENCOUNTER — Ambulatory Visit
Admission: RE | Admit: 2015-12-04 | Discharge: 2015-12-04 | Disposition: A | Discharge status: Home / Self Care | Payer: No Typology Code available for payment source | Source: Ambulatory Visit | Attending: Family Medicine | Admitting: Family Medicine

## 2015-12-04 DIAGNOSIS — Z1231 Encounter for screening mammogram for malignant neoplasm of breast: Secondary | ICD-10-CM

## 2015-12-06 ENCOUNTER — Encounter: Payer: Self-pay | Admitting: Vascular Surgery

## 2015-12-06 ENCOUNTER — Ambulatory Visit (INDEPENDENT_AMBULATORY_CARE_PROVIDER_SITE_OTHER): Payer: No Typology Code available for payment source | Admitting: Vascular Surgery

## 2015-12-06 VITALS — BP 132/79 | HR 71 | Temp 98.5°F | Ht 59.5 in | Wt 140.2 lb

## 2015-12-06 DIAGNOSIS — N186 End stage renal disease: Secondary | ICD-10-CM

## 2015-12-06 DIAGNOSIS — Z992 Dependence on renal dialysis: Secondary | ICD-10-CM

## 2015-12-06 DIAGNOSIS — T82510D Breakdown (mechanical) of surgically created arteriovenous fistula, subsequent encounter: Secondary | ICD-10-CM

## 2015-12-06 DIAGNOSIS — T82898D Other specified complication of vascular prosthetic devices, implants and grafts, subsequent encounter: Secondary | ICD-10-CM

## 2015-12-06 NOTE — Progress Notes (Signed)
    Postoperative Access Visit   History of Present Illness  Sabrina Mitchell is a 54 y.o. year old female who presents for postoperative follow-up for: Plication of L BC AVF (Date: 10/28/15).  The patient's wounds are healed.  The patient notes no steal symptoms.  The patient is able to complete their activities of daily living.  The patient's current symptoms are: pain in L arm during HD.  For VQI Use Only  PRE-ADM LIVING: Home  AMB STATUS: Ambulatory  Physical Examination Filed Vitals:   12/06/15 0843  BP: 132/79  Pulse: 71  Temp: 98.5 F (36.9 C)    LUE: Incision is healed, skin feels warm, hand grip is 5/5, sensation in digits is intact, palpable thrill, bruit can be auscultated   Medical Decision Making  Sabrina Mitchell is a 54 y.o. year old female who presents s/p L BC AVF plication .  Unclear to me the etiology of this patient's pain.  She doesn't have findings consistent with steal syndrome and her sx are not consistent with such.  The patient's inflow is widely patent on exam.   On the 2012 fistulogram this was also true, so I doubt repeat fistulogram is going to demonstrate a hemodynamically significant stenosis that could be contributing to her sx.  The entirety of this patient's access is ready for use.  Thank you for allowing [place] to participate in this patient's care.  Adele Barthel, MD [facility] Office: [phone] Pager: [phone]  12/06/2015, 9:04 AM

## 2015-12-09 NOTE — Telephone Encounter (Signed)
Date of birth verified by pt  Stool Card results given, Pt aware positive occult card  Notified referral Gi was placed  Pt verbalized understanding  Information given in Spanish

## 2015-12-09 NOTE — Telephone Encounter (Signed)
Nurse called patient via [employer], [place]. Interpreter reached voicemail. Left message for patient to call Majestic Brister with [facility], at [phone]. Nurse called patient to make patient aware of stool card being positive for blood. GI referral has been placed for patient to be evaluated and colonoscopy to be performed by GI.

## 2015-12-09 NOTE — Telephone Encounter (Signed)
-----   Message from Willeen Niece, MD sent at 11/28/2015  3:44 PM EST ----- Please let patient know that one of her stool cards was positive for blood.  I have entered a referral for GI to evaluate, perform screening test with colonoscopy.  Thanks,  JB

## 2015-12-17 ENCOUNTER — Ambulatory Visit: Payer: No Typology Code available for payment source | Admitting: Gastroenterology

## 2016-01-03 ENCOUNTER — Ambulatory Visit: Payer: Self-pay | Admitting: Gastroenterology

## 2016-01-16 ENCOUNTER — Telehealth: Payer: Self-pay | Admitting: Nephrology

## 2016-01-16 NOTE — Telephone Encounter (Signed)
Patient called requesting a medication refill. Patient stated that the medicine is to cleanse the colon, but she does not know the name Please follow up.

## 2016-01-16 NOTE — Telephone Encounter (Signed)
Pt stated has appointment with GI for colonoscopy  No medication requested at this time

## 2016-01-17 ENCOUNTER — Encounter: Payer: Self-pay | Admitting: Gastroenterology

## 2016-01-17 ENCOUNTER — Other Ambulatory Visit: Payer: Self-pay

## 2016-01-17 ENCOUNTER — Ambulatory Visit (INDEPENDENT_AMBULATORY_CARE_PROVIDER_SITE_OTHER): Payer: Self-pay | Admitting: Gastroenterology

## 2016-01-17 VITALS — BP 119/76 | HR 71 | Temp 97.8°F | Ht <= 58 in | Wt 140.2 lb

## 2016-01-17 DIAGNOSIS — K219 Gastro-esophageal reflux disease without esophagitis: Secondary | ICD-10-CM

## 2016-01-17 DIAGNOSIS — R1319 Other dysphagia: Secondary | ICD-10-CM

## 2016-01-17 DIAGNOSIS — R1310 Dysphagia, unspecified: Secondary | ICD-10-CM

## 2016-01-17 DIAGNOSIS — R1013 Epigastric pain: Secondary | ICD-10-CM

## 2016-01-17 DIAGNOSIS — R195 Other fecal abnormalities: Secondary | ICD-10-CM

## 2016-01-17 DIAGNOSIS — R1314 Dysphagia, pharyngoesophageal phase: Secondary | ICD-10-CM

## 2016-01-17 NOTE — Assessment & Plan Note (Signed)
Chronic gerd and esophageal dysphagia to solid foods, vague odynophagia. Currently not on PPI. Previously did well on Prilosec. Given dysphagia, epigastric pain offered EGD/ED for further evaluation.  I have discussed the risks, alternatives, benefits with regards to but not limited to the risk of reaction to medication, bleeding, infection, perforation and the patient is agreeable to proceed. Written consent to be obtained.

## 2016-01-17 NOTE — Progress Notes (Signed)
Primary Care Physician:  Louis Meckel, MD  Primary Gastroenterologist:  Barney Drain, MD   Chief Complaint  Patient presents with  . Abdominal Pain  . guaiac postive stool s    HPI:  Sabrina Mitchell is a 55 y.o. female here at the request of PCP for further evaluation of heme + stool. She presents with language interpreter today. Patient has been on hemodialysis for five years. Schedule is Tues/Thurs/Sat.   Complains of two year history of upper abdominal pain. Worse with meals. Especially with spicy foods which she tries to avoid. Has been on prilosec in past with good relief. Currently on TUMS. Complains of esophageal dysphagia at times and odynophagia. Describes abdominal pain as burning quality. Told before she has gastritis. Denies constipation, diarrhea, melena, brbpr.   No prior EGD/TCS. s gastritis. Some pp. With spicey, tries to avoid. Burns in back of throat. roofof mouth.     Current Outpatient Prescriptions  Medication Sig Dispense Refill  . acetaminophen (TYLENOL) 325 MG tablet Take 650 mg by mouth every 6 (six) hours as needed.    Marland Kitchen amLODipine (NORVASC) 5 MG tablet Take 5 mg by mouth daily.    . cholecalciferol (VITAMIN D) 1000 units tablet Take 1,000 Units by mouth daily.     No current facility-administered medications for this visit.    Allergies as of 01/17/2016  . (No Known Allergies)    Past Medical History  Diagnosis Date  . Hypertension   . Anemia   . UTI (lower urinary tract infection) March 2014  . Family history of adverse reaction to anesthesia     sister vomiting after uterine cyst removal  . Renal disorder   . End stage renal disease ([place])     hemodialysis 3x/wk @ [address]  . GERD (gastroesophageal reflux disease)   . Gastritis     Past Surgical History  Procedure Laterality Date  . Tubal ligation  2004  . Av fistula placement Left 07/05/09    Dr. Kellie Simmering  . Fistulogram N/A 10/22/2014    Procedure: FISTULOGRAM;   Surgeon: Angelia Mould, MD;  Location: [facility] CATH LAB;  Service: Cardiovascular;  Laterality: N/A;  . Revison of arteriovenous fistula Left [id number]    Procedure: PLICATION OF BRACHIOCEPHALIC FISTULA;  Surgeon: Conrad Aurora, MD;  Location: [facility];  Service: Vascular;  Laterality: Left;    Family History  Problem Relation Age of Onset  . Hypertension Mother   . Hypertension Father   . Hypertension Sister   . Diabetes Brother   . Hypertension Brother     Social History   Social History  . Marital Status: Single    Spouse Name: N/A  . Number of Children: 2  . Years of Education: N/A   Occupational History  . Not on file.   Social History Main Topics  . Smoking status: Never Smoker   . Smokeless tobacco: Never Used  . Alcohol Use: No  . Drug Use: No  . Sexual Activity: Not Currently    Birth Control/ Protection: Surgical   Other Topics Concern  . Not on file   Social History Narrative      ROS:  General: Negative for anorexia, weight loss, fever, chills, fatigue, weakness. Eyes: Negative for vision changes.  ENT: Negative for hoarseness,  nasal congestion. See hpi CV: Negative for chest pain, angina, palpitations, dyspnea on exertion, peripheral edema.  Respiratory: Negative for dyspnea at rest, dyspnea on exertion, cough, sputum, wheezing.  GI: See history  of present illness. GU:  Negative for dysuria, hematuria, urinary incontinence, urinary frequency, nocturnal urination.  MS: Negative for joint pain, low back pain.  Derm: Negative for rash or itching.  Neuro: Negative for weakness, abnormal sensation, seizure, frequent headaches, memory loss, confusion.  Psych: Negative for anxiety, depression, suicidal ideation, hallucinations.  Endo: Negative for unusual weight change.  Heme: Negative for bruising or bleeding. Allergy: Negative for rash or hives.    Physical Examination:  BP 119/76 mmHg  Pulse 71  Temp(Src) 97.8 F (36.6 C)  Ht 4\' 10"  (1.473 m)   Wt 140 lb 3.2 oz (63.594 kg)  BMI 29.31 kg/m2  LMP 02/11/2012   General: Well-nourished, well-developed in no acute distress. Accompanied by daughter.  Head: Normocephalic, atraumatic.   Eyes: Conjunctiva pink, no icterus. Mouth: Oropharyngeal mucosa moist and pink , no lesions erythema or exudate. Neck: Supple without thyromegaly, masses, or lymphadenopathy.  Lungs: Clear to auscultation bilaterally.  Heart: Regular rate and rhythm, no murmurs rubs or gallops.  Abdomen: Bowel sounds are normal, nontender, nondistended, no hepatosplenomegaly or masses, no abdominal bruits or    hernia , no rebound or guarding.   Rectal: deferred Extremities: No lower extremity edema. No clubbing or deformities.  Neuro: Alert and oriented x 4 , grossly normal neurologically.  Skin: Warm and dry, no rash or jaundice.   Psych: Alert and cooperative, normal mood and affect.  Labs: Lab Results  Component Value Date   CREATININE 6.17* 09/27/2015   BUN 30* 09/27/2015   NA 137 10/28/2015   K 3.4* 10/28/2015   CL 100* 09/27/2015   CO2 29 09/27/2015   Lab Results  Component Value Date   ALT 23 09/27/2015   AST 24 09/27/2015   ALKPHOS 147* 09/27/2015   BILITOT 0.7 09/27/2015   Lab Results  Component Value Date   WBC 5.2 09/27/2015   HGB 10.5* 10/28/2015   HCT 31.0* 10/28/2015   MCV 92.9 09/27/2015   PLT 163 09/27/2015   Lab Results  Component Value Date   LIPASE 56* 09/27/2015     Imaging Studies: No results found.

## 2016-01-17 NOTE — Assessment & Plan Note (Addendum)
Heme + stools. Plan on colonoscopy in near future.  I have discussed the risks, alternatives, benefits with regards to but not limited to the risk of reaction to medication, bleeding, infection, perforation and the patient is agreeable to proceed. Written consent to be obtained.  Chronic anemia. No iron studies available. Stable. Likely anemia of chronic disease in setting of renal disease. Egd/tcs as planned for heme + stool and gerd/dysphagia/epigastric pain.

## 2016-01-17 NOTE — Patient Instructions (Addendum)
1. Prilosec 20 mg daily before breakfast. Samples provided. 2. Colonoscopy and upper endoscopy with Dr. Oneida Alar. Please see separate instructions

## 2016-01-20 NOTE — Progress Notes (Signed)
CC'ED TO PCP 

## 2016-02-10 ENCOUNTER — Encounter (HOSPITAL_COMMUNITY)
Admission: RE | Disposition: A | Discharge status: Home Health | Payer: Self-pay | Source: Ambulatory Visit | Attending: Gastroenterology

## 2016-02-10 ENCOUNTER — Ambulatory Visit (HOSPITAL_COMMUNITY)
Admission: RE | Admit: 2016-02-10 | Discharge: 2016-02-10 | Disposition: A | Discharge status: Home Health | Payer: MEDICAID | Source: Ambulatory Visit | Attending: Gastroenterology | Admitting: Gastroenterology

## 2016-02-10 ENCOUNTER — Encounter (HOSPITAL_COMMUNITY): Payer: Self-pay | Admitting: *Deleted

## 2016-02-10 DIAGNOSIS — K219 Gastro-esophageal reflux disease without esophagitis: Secondary | ICD-10-CM | POA: Insufficient documentation

## 2016-02-10 DIAGNOSIS — K317 Polyp of stomach and duodenum: Secondary | ICD-10-CM | POA: Insufficient documentation

## 2016-02-10 DIAGNOSIS — K222 Esophageal obstruction: Secondary | ICD-10-CM | POA: Insufficient documentation

## 2016-02-10 DIAGNOSIS — K644 Residual hemorrhoidal skin tags: Secondary | ICD-10-CM | POA: Insufficient documentation

## 2016-02-10 DIAGNOSIS — R195 Other fecal abnormalities: Secondary | ICD-10-CM | POA: Insufficient documentation

## 2016-02-10 DIAGNOSIS — R1319 Other dysphagia: Secondary | ICD-10-CM

## 2016-02-10 DIAGNOSIS — R1310 Dysphagia, unspecified: Secondary | ICD-10-CM

## 2016-02-10 DIAGNOSIS — K319 Disease of stomach and duodenum, unspecified: Secondary | ICD-10-CM | POA: Insufficient documentation

## 2016-02-10 DIAGNOSIS — Z992 Dependence on renal dialysis: Secondary | ICD-10-CM | POA: Insufficient documentation

## 2016-02-10 DIAGNOSIS — I12 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 disease: Secondary | ICD-10-CM | POA: Insufficient documentation

## 2016-02-10 DIAGNOSIS — K648 Other hemorrhoids: Secondary | ICD-10-CM | POA: Insufficient documentation

## 2016-02-10 DIAGNOSIS — N186 End stage renal disease: Secondary | ICD-10-CM | POA: Insufficient documentation

## 2016-02-10 DIAGNOSIS — K2970 Gastritis, unspecified, without bleeding: Secondary | ICD-10-CM

## 2016-02-10 DIAGNOSIS — R1013 Epigastric pain: Secondary | ICD-10-CM

## 2016-02-10 DIAGNOSIS — K635 Polyp of colon: Secondary | ICD-10-CM | POA: Insufficient documentation

## 2016-02-10 DIAGNOSIS — K2990 Gastroduodenitis, unspecified, without bleeding: Secondary | ICD-10-CM

## 2016-02-10 DIAGNOSIS — K2980 Duodenitis without bleeding: Secondary | ICD-10-CM | POA: Insufficient documentation

## 2016-02-10 DIAGNOSIS — D124 Benign neoplasm of descending colon: Secondary | ICD-10-CM

## 2016-02-10 DIAGNOSIS — Z79899 Other long term (current) drug therapy: Secondary | ICD-10-CM | POA: Insufficient documentation

## 2016-02-10 DIAGNOSIS — K2960 Other gastritis without bleeding: Secondary | ICD-10-CM | POA: Insufficient documentation

## 2016-02-10 HISTORY — PX: ESOPHAGOGASTRODUODENOSCOPY: SHX5428

## 2016-02-10 HISTORY — DX: Other complications of anesthesia, initial encounter: T88.59XA

## 2016-02-10 HISTORY — DX: Adverse effect of unspecified anesthetic, initial encounter: T41.45XA

## 2016-02-10 HISTORY — PX: ESOPHAGEAL DILATION: SHX303

## 2016-02-10 HISTORY — DX: Other specified postprocedural states: R11.2

## 2016-02-10 HISTORY — DX: Other specified postprocedural states: Z98.890

## 2016-02-10 HISTORY — DX: Unspecified hemorrhoids: K64.9

## 2016-02-10 HISTORY — PX: COLONOSCOPY: SHX5424

## 2016-02-10 SURGERY — COLONOSCOPY
Anesthesia: Moderate Sedation

## 2016-02-10 MED ORDER — LIDOCAINE VISCOUS 2 % MT SOLN
OROMUCOSAL | Status: AC
  Filled 2016-02-10: qty 15

## 2016-02-10 MED ORDER — STERILE WATER FOR IRRIGATION IR SOLN
Status: DC | PRN
  Administered 2016-02-10: 11:00:00

## 2016-02-10 MED ORDER — FENTANYL CITRATE (PF) 100 MCG/2ML IJ SOLN
INTRAMUSCULAR | Status: AC
  Filled 2016-02-10: qty 2

## 2016-02-10 MED ORDER — SODIUM CHLORIDE 0.9 % IV SOLN
INTRAVENOUS | Status: DC
  Administered 2016-02-10: 200 mL via INTRAVENOUS

## 2016-02-10 MED ORDER — PROMETHAZINE HCL 25 MG/ML IJ SOLN
INTRAMUSCULAR | Status: AC
  Filled 2016-02-10: qty 1

## 2016-02-10 MED ORDER — FENTANYL CITRATE (PF) 100 MCG/2ML IJ SOLN
INTRAMUSCULAR | Status: DC | PRN
  Administered 2016-02-10 (×3): 25 ug via INTRAVENOUS
  Administered 2016-02-10: 50 ug via INTRAVENOUS
  Administered 2016-02-10: 25 ug via INTRAVENOUS

## 2016-02-10 MED ORDER — MINERAL OIL PO OIL
TOPICAL_OIL | ORAL | Status: AC
  Filled 2016-02-10: qty 30

## 2016-02-10 MED ORDER — MIDAZOLAM HCL 5 MG/5ML IJ SOLN
INTRAMUSCULAR | Status: DC | PRN
  Administered 2016-02-10 (×2): 2 mg via INTRAVENOUS
  Administered 2016-02-10 (×2): 1 mg via INTRAVENOUS
  Administered 2016-02-10: 2 mg via INTRAVENOUS
  Administered 2016-02-10: 1 mg via INTRAVENOUS

## 2016-02-10 MED ORDER — MEPERIDINE HCL 100 MG/ML IJ SOLN
INTRAMUSCULAR | Status: AC
  Filled 2016-02-10: qty 2

## 2016-02-10 MED ORDER — OMEPRAZOLE 20 MG PO CPDR: DELAYED_RELEASE_CAPSULE | ORAL | Status: DC

## 2016-02-10 MED ORDER — MIDAZOLAM HCL 5 MG/5ML IJ SOLN
INTRAMUSCULAR | Status: AC
  Filled 2016-02-10: qty 10

## 2016-02-10 MED ORDER — LIDOCAINE VISCOUS 2 % MT SOLN
OROMUCOSAL | Status: DC | PRN
  Administered 2016-02-10: 4 mL via OROMUCOSAL

## 2016-02-10 MED ORDER — PROMETHAZINE HCL 25 MG/ML IJ SOLN
INTRAMUSCULAR | Status: DC | PRN
  Administered 2016-02-10: 12.5 mg via INTRAVENOUS

## 2016-02-10 NOTE — Op Note (Addendum)
[facility] [address] [place], [address]   COLONOSCOPY PROCEDURE REPORT  PATIENT: Sabrina Mitchell  MR#: [id number] BIRTHDATE: May 06, 1961 , 54  yrs. old GENDER: female ENDOSCOPIST: Danie Binder, MD REFERRED [id number] [place], M.D. PROCEDURE DATE:  17-Feb-2016 PROCEDURE:   Colonoscopy with cold biopsy polypectomy and Colonoscopy with snare polypectomy INDICATIONS:heme-positive stool. MEDICATIONS: Fentanyl 125 mcg IV, Versed 7 mg IV, and Promethazine (Phenergan) 12.5 mg IV  MD INITIATED SEDATION: 1032. PROCEDURE COMPLETE: 1128.  DESCRIPTION OF PROCEDURE:    Physical exam was performed.  Informed consent was obtained from the patient after explaining the benefits, risks, and alternatives to procedure.  The patient was connected to monitor and placed in left lateral position. Continuous oxygen was provided by nasal cannula and IV medicine administered through an indwelling cannula.  After administration of sedation and rectal exam, the patients rectum was intubated and the [id number]  colonoscope was advanced under direct visualization to the ileum.  The scope was removed slowly by carefully examining the color, texture, anatomy, and integrity mucosa on the way out.  The patient was recovered in endoscopy and discharged home in satisfactory condition. Estimated blood loss is zero unless otherwise noted in this procedure report.     COLON FINDINGS: NORMAL ILEUM  10 CM VISUALIZED.Three sessile polyps ranging from 2 to 34mm in size were found in the sigmoid colon and at the hepatic flexure.  A polypectomy was performed with cold forceps.  , Seven sessile polyps ranging from 5 to 33mm in size were found in the sigmoid colon, descending colon, and distal transverse colon.  A polypectomy was performed using snare cautery.  , MELANOSIS COLI, Small internal hemorrhoids were found.  , and Moderate sized external hemorrhoids were found.  PREP  QUALITY: good.  CECAL W/D TIME: 30       minutes COMPLICATIONS: None  ENDOSCOPIC IMPRESSION: 1.   HEME POSITIVE STOOLS DUE TO COLON POLYPS/INTERNA HEMORRHOIDS 2.   SMALL INTERNAL HEMORRHOIDS 3.   MODERTAE EXTERNAL HEMORRHOIDS  RECOMMENDATIONS: NO HEPARIN WITH DIALYSIS UNTIL FEB 18. FOLLOW A HIGH FIBER DIET. START OMEPRAZOLE 30 MINUTES PRIOR TO MEALS BID. AWAIT BIOPSY RESULTS. FOLLOW UP IN 3 MOS.   _______________________________ Lorrin MaisDanie Binder, MD 02/17/16 2:50 PM Revised: 02/17/2016 2:50 PM  CPT CODES: ICD CODES:  The ICD and CPT codes recommended by this software are interpretations from the data that the clinical staff has captured with the software.  The verification of the translation of this report to the ICD and CPT codes and modifiers is the sole responsibility of the health care institution and practicing physician where this report was generated.  [employer] will not be held responsible for the validity of the ICD and CPT codes included on this report.  AMA assumes no liability for data contained or not contained herein. CPT is a Designer, television/film set of the [employer].

## 2016-02-10 NOTE — Discharge Instructions (Signed)
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 SHE HAS gastritis & DUODENITIS.   SHE HAD 10 POLYPS REMOVED. I biopsied her stomach.   NO HEPARIN WITH DIALYSIS UNTIL FEB 18.  FOLLOW A HIGH FIBER DIET. SEE INFO BELOW.  START  OMEPRAZOLE.  TAKE 30 MINUTES PRIOR TO YOUR MEALS TWICE DAILY TO TREAT UPPER ABDOMINAL PAIN.  YOUR BIOPSY RESULTS WILL BE AVAILABLE IN MY CHART FEB 17 AND MY OFFICE WILL CONTACT YOU IN 10-14 DAYS WITH YOUR RESULTS.   FOLLOW UP IN 3 MOS.      Esofagogastroduodenoscopia: cuidados posteriores (Esophagogastroduodenoscopy, Care After) Siga estas instrucciones durante las prximas semanas. Estas indicaciones le proporcionan informacin acerca de cmo deber cuidarse despus del procedimiento. El mdico tambin podr darle instrucciones ms especficas. El tratamiento ha sido planificado segn las prcticas mdicas actuales, pero en algunos casos pueden ocurrir problemas. Comunquese con el mdico si tiene algn problema o tiene dudas despus del procedimiento.  QU ESPERAR DESPUS DEL PROCEDIMIENTO  Despus del procedimiento, es tpico tener las siguientes sensaciones:   Public relations account executive.  Dolor al tragar.  Ganas de vomitar (nuseas).  Hinchazn.  Mareos.  Cansancio. INSTRUCCIONES PARA EL CUIDADO EN EL HOGAR  No coma ni beba nada hasta que el efecto del anestsico (anestesia local) haya desaparecido y vuelva a tener reflejo farngeo. Si puede tragar con comodidad, significa que el efecto de la anestesia local ha desaparecido.  No conduzca ni opere maquinarias hasta que el mdico lo autorice.  Tome los medicamentos solamente como se lo haya indicado el mdico. SOLICITE ATENCIN MDICA SI:   No puede dejar de toser.  No orina u orina menos de lo habitual. SOLICITE ATENCIN MDICA DE INMEDIATO SI:  Tiene dificultad para tragar.  No puede comer o beber.  El dolor de garganta o pecho empeora.  Est mareado, tiene una sensacin de  desvanecimiento o se desmaya.  Tiene nuseas o vmitos.  Tiene escalofros.  Tiene fiebre.  Siente un dolor abdominal intenso.  La materia fecal es negra, de aspecto alquitranado o sanguinolenta.   Esta informacin no tiene Marine scientist el consejo del mdico. Asegrese de hacerle al mdico cualquier pregunta que tenga.    Dieta rica en fibra  (High-Fiber Diet)  La fibra, tambin llamada fibra dietaria, es un tipo de carbohidrato que se encuentra en las frutas, las verduras, los cereales integrales y los frijoles. Una dieta rica en fibra puede tener muchos beneficios para la salud. El mdico puede recomendar una dieta rica en fibra para ayudar a:  Contractor. La fibra puede hacer que defeque con ms frecuencia.  Disminuir el nivel de colesterol.  Capitan hemorroides, la diverticulosis no complicada o el sndrome del intestino irritable.  Evitar comer en exceso como parte de un plan para bajar de peso.  Evitar cardiopatas, la diabetes tipo 2 y ciertos cnceres. EN QU CONSISTE EL PLAN?  El consumo diario recomendado de fibra incluye lo siguiente:  22 gramos para hombres menores de 36 aos.  30 gramos para hombres mayores de 50 aos.  25 gramos para mujeres menores de 50 aos.  21 gramos para mujeres mayores de 50 aos. Puede lograr el consumo diario recomendado de fibra si come una variedad de frutas, verduras, cereales y frijoles. El mdico tambin puede recomendar un suplemento de fibra si no es posible obtener suficiente fibra a travs de la dieta.  QU DEBO SABER ACERCA DE [place]?  La eficacia  de los suplementos de [place] no ha sido estudiada [place], de modo que es mejor obtener fibra a travs de los alimentos.  Verifique siempre el contenido de fibra en la etiqueta de informacin nutricional de los alimentos preenvasados. Busque alimentos que contengan al menos 5 gramos de fibra por porcin.  Consulte al nutricionista si tiene  preguntas sobre algunos alimentos especficos relacionados con su enfermedad, especialmente si estos alimentos no se mencionan a continuacin.  Aumente el consumo diario de fibra en forma gradual. Aumentar demasiado rpido el consumo de fibra dietaria puede provocar meteorismo, clicos o gases.  Beber abundante agua. El [place] a Economist. QU ALIMENTOS PUEDO COMER?  Cereales  Panes integrales. Multicereales. Avena. Arroz integral. Dwyane Luo. Trigo burgol. Mijo. Muffins de salvado. Palomitas de maz. Galletas de centeno.  Verduras  Batatas. [place]. Col rizada. [place]ofas. Repollo. Brcoli. Guisantes. Zanahorias. [place].  Frutas  Frutos rojos. Peras. Manzanas. Naranjas [place]. Ciruelas y pasas. Higos secos.  Carnes y otras fuentes de protenas  Frijoles blancos, colorados, pintos y porotos de soja. Guisantes secos. Lentejas. Frutos secos y semillas.  Lcteos  Yogur fortificado con Pharmacist, hospital.  Bebidas  Leche de soja fortificada con Fredderick Phenix. Jugo de naranja fortificado con Fredderick Phenix.  Otros  Barras de [place].  Los artculos mencionados arriba pueden no ser [employer] de las bebidas o los alimentos recomendados. Comunquese con el nutricionista para conocer ms opciones.  QU ALIMENTOS NO SE RECOMIENDAN?  Cereales  Pan blanco. Pastas hechas con Letitia Neri. Arroz blanco.  Verduras  Papas fritas. Verduras enlatadas. Verduras bien cocidas.  Frutas  Jugo de frutas. Frutas cocidas coladas.  Carnes y otras fuentes de protenas  Cortes de carne con Lobbyist. Aves o pescados fritos.  Lcteos  Leche. Yogur. Queso crema. [employer].  Bebidas  Gaseosas.  Otros  Tortas y pasteles. [place] y aceites.  Los artculos mencionados arriba pueden no ser [employer] de las bebidas y los alimentos que se Higher education careers adviser. Comunquese con el nutricionista para obtener ms informacin.  ALGUNOS CONSEJOS PARA INCLUIR ALIMENTOS RICOS EN FIBRA EN LA DIETA  Consuma una gran variedad de  alimentos ricos en fibra.  Asegrese de que la mitad de todos los cereales consumidos cada da sean cereales integrales.  Reemplace los panes y cereales hechos de harina refinada o harina blanca por panes y cereales integrales.  Reemplace el arroz blanco por arroz integral, trigo burgol o mijo.  Comience Games developer con un desayuno rico en [place], como un cereal que contenga al menos 5 gramos de fibra por porcin.  Use guisantes en lugar de carne en las sopas, ensaladas o pastas.  Coma bocadillos ricos en fibra, como frutos rojos, verduras crudas, frutos secos o palomitas de maz.   Gastritis - Adultos  (Gastritis, Adult)    La gastrittis es la irritacin (inflamacin) de la membrana interna del estmago. Puede ser Ardelia Mems enfermedad de inicio sbito (aguda) o de largo plazo (crnica). Si la gastritis no se trata, puede causar sangrado y lceras.  CAUSAS  La gastritis se produce cuando la membrana que tapiza interiormente al estmago se debilita o se daa. Los jugos digestivos del estmago inflaman el revestimiento del estmago debilitado. El revestimiento del estmago puede debilitarse o daarse por una infeccin viral o bacteriana. La infeccin bacteriana ms comn es la infeccin por Helicobacter pylori. Tambin puede ser el resultado del consumo excesivo de alcohol, por el uso de ciertos medicamentos o porque hay demasiado cido en el estmago.  SNTOMAS  En algunos  casos no hay sntomas. Si se presentan sntomas, stos pueden ser:  Dolor o sensacin de ardor en la parte superior del abdomen.  Nuseas.  Vmitos.  Sensacin molesta de distensin despus de comer. DIAGNSTICO  El mdico puede diagnosticar gastritis segn los sntomas y el examen fsico. Para determinar la causa de la gastritis, el mdico podr:  Pedir anlisis de sangre o de materia fecal para diagnosticar la presencia de la bacteria H pylori.  Gastroscopa. Un tubo delgado y flexible (endoscopio) se pasa por Industrial/product designer  al [employer]. El endoscopio tiene [place] luz y una cmara en el extremo. El mdico utilizar el endoscopio para observar el interior del [place].  Tomar una muestra de tejido (biopsia) del estmago para examinarlo en el microscopio. TRATAMIENTO  Segn la causa de la gastritis podrn recetarle: Antibiticos, si la causa es una infeccin bacteriana, como una infeccin por H. pylori. Anticidos o bloqueadores H2, si hay demasiado cido en el estmago. El Viacom aconsejar que deje de tomar aspirina, ibuprofeno u otros antiinflamatorios no esteroides (AINE).  INSTRUCCIONES PARA EL CUIDADO EN EL HOGAR  Tome slo medicamentos de venta libre o recetados, segn las indicaciones del mdico.  Si le han recetado antibiticos, tmelos segn las indicaciones. Tmelos todos, aunque se sienta mejor.  Debe ingerir gran cantidad de lquido para mantener la orina de tono claro o color amarillo plido.  Evite las comidas y bebidas que empeoran los [place], [place]:  [place] con cafena o alcohlicas.  Chocolate.  Sabores a English as a second language teacher.  Ajo y cebolla.  Comidas muy condimentadas.  Ctricos como naranjas, limones o limas.  Alimentos que contengan tomate, como salsas, [place] y pizza.  Alimentos fritos y Radio broadcast assistant. Haga comidas pequeas durante Psychiatrist de 3 comidas abundantes.  Plipos en el colon  (Colon Polyps)  Los plipos son masas de tejido que crecen dentro del cuerpo. Los plipos pueden desarrollarse en el intestino grueso (colon). La mayora de los plipos son no cancerosos (benignos). Sin embargo, algunos plipos pueden convertirse en cancerosos con el tiempo. Los plipos que sean ms grandes que un guisante pueden ser [employer]. Para estar seguros, los mdicos extirpan y Alber[employer]on's plipos.  CAUSAS  Se forman cuando ciertas mutaciones genticas hacen que las clulas se desarrollen y se dividan por dems.  FACTORES DE RIESGO  Hay un nmero de factores de riesgo que pueden aumentar las probabilidades  de padecer plipos en el colon. Ellos son:  Ser mayor de 68 aos de edad.  Historia familiar de cncer o plipos de colon.  Ciertas enfermedades crnicas como la colitis o la enfermedad de Crohn.  Tener sobrepeso.  El hbito de fumar.  El sedentarismo.  Beber alcohol en exceso. SNTOMAS  La mayor parte de los plipos no causa sntomas. Si se presentan sntomas, stos pueden ser:  [employer] materia fecal. Heces de color rojo oscuro o negro.  Constipacin o diarrea que duran ms de 1 semana.  PREVENCIN  Para disminuir los riesgos de volver a Best boy plipos en el colon:  Coma mucha fruta y [place]. Evite las comidas grasas.  No fume.  Evite consumir alcohol.  [place].  Baje de peso segn las indicaciones de su mdico.  Consuma mucho clcio y folato. Las comidas que contienen calcio son la [place], los quesos y el brcoli. Las comidas que contienen folato son los garbanzos, los frijoles rojos y Nurse, mental health.

## 2016-02-10 NOTE — Progress Notes (Signed)
Patient's daughter verbalized understanding of discharge instructions. No heparin until February 18. Patient's daughter to make sure patient understands this before going to dialysis on February 14. (Patient was told also, but still under sedation affects.) Dr. Oneida Alar gave patient's daughter a note for patient to carry to dialysis ordering- No Heparin until February 18. Daughter verbalized she understood discharge instructions which were given in [place] and [place]. Daughter verbalized understanding to nurse and to Dr. Oneida Alar. No questions at this time.

## 2016-02-10 NOTE — H&P (Addendum)
  Primary Care Physician:  Louis Meckel, MD Primary Gastroenterologist:  Dr. Oneida Alar  Pre-Procedure History & Physical: HPI:  Sabrina Mitchell is a 55 y.o. female here for  HEME POS STOOLS/dyspepsia.  Past Medical History  Diagnosis Date  . Hypertension   . Anemia   . UTI (lower urinary tract infection) March 2014  . Renal disorder   . End stage renal disease ([place])     hemodialysis 3x/wk @ [address]  . GERD (gastroesophageal reflux disease)   . Gastritis   . Family history of adverse reaction to anesthesia     sister vomiting after uterine cyst removal  . Complication of anesthesia   . PONV (postoperative nausea and vomiting)     Past Surgical History  Procedure Laterality Date  . Tubal ligation  2004  . Av fistula placement Left 07/05/09    Dr. Kellie Simmering  . Fistulogram N/A 10/22/2014    Procedure: FISTULOGRAM;  Surgeon: Angelia Mould, MD;  Location: [facility] CATH LAB;  Service: Cardiovascular;  Laterality: N/A;  . Revison of arteriovenous fistula Left [id number]    Procedure: PLICATION OF BRACHIOCEPHALIC FISTULA;  Surgeon: Conrad Effingham, MD;  Location: [facility];  Service: Vascular;  Laterality: Left;    Prior to Admission medications   Medication Sig Start Date End Date Taking? Authorizing Provider  amLODipine (NORVASC) 5 MG tablet Take 5 mg by mouth daily.   Yes Historical Provider, MD  acetaminophen (TYLENOL) 325 MG tablet Take 650 mg by mouth every 6 (six) hours as needed.    Historical Provider, MD  cholecalciferol (VITAMIN D) 1000 units tablet Take 1,000 Units by mouth daily.    Historical Provider, MD    Allergies as of 01/17/2016  . (No Known Allergies)    Family History  Problem Relation Age of Onset  . Hypertension Mother   . Hypertension Father   . Hypertension Sister   . Diabetes Brother   . Hypertension Brother     Social History   Social History  . Marital Status: Single    Spouse Name: N/A  . Number of Children: 2  . Years of  Education: N/A   Occupational History  . Not on file.   Social History Main Topics  . Smoking status: Never Smoker   . Smokeless tobacco: Never Used  . Alcohol Use: No  . Drug Use: No  . Sexual Activity: Not Currently    Birth Control/ Protection: Surgical   Other Topics Concern  . Not on file   Social History Narrative    Review of Systems: See HPI, otherwise negative ROS   Physical Exam: BP 124/69 mmHg  Pulse 71  Temp(Src) 98.3 F (36.8 C) (Oral)  Resp 16  Wt 140 lb (63.504 kg)  SpO2 100%  LMP 02/11/2012 General:   Alert,  pleasant and cooperative in NAD Head:  Normocephalic and atraumatic. Neck:  Supple; Lungs:  Clear throughout to auscultation.    Heart:  Regular rate and rhythm. Abdomen:  Soft, nontender and nondistended. Normal bowel sounds, without guarding, and without rebound.   Neurologic:  Alert and  oriented x4;  grossly normal neurologically.  Impression/Plan:     HEME POS STOOLS/dyspepsia  PLAN:  1.TCS/EGD today. DISCUSSED PROCEDURE, BENEFITS, & RISKS with pt/daughter via interpreter: < 1% chance of medication reaction, bleeding, perforation, or rupture of spleen/liver.

## 2016-02-10 NOTE — Op Note (Addendum)
[facility] [address] [place], [address]   ENDOSCOPY PROCEDURE REPORT  PATIENT: Sabrina Mitchell  MR#: [id number] BIRTHDATE: 02/05/1961 , 54  yrs. old GENDER: female  ENDOSCOPIST: Danie Binder, MD REFERRED [id number] [place], M.D.  PROCEDURE DATE: 2016/02/27 PROCEDURE:   EGD w/ biopsy INDICATIONS:dyspepsia.   epigastric pain. MEDICATIONS: TCS+ Fentanyl 25 mcg IV and Versed 2 mg IV MD INITIATED SEDATION: 1032 PROCEDURE COMPLETE: 1128 TOPICAL ANESTHETIC:   Viscous Xylocaine  ASA CLASS:  DESCRIPTION OF PROCEDURE:     Physical exam was performed.  Informed consent was obtained from the patient after explaining the benefits, risks, and alternatives to the procedure.  The patient was connected to the monitor and placed in the left lateral position.  Continuous oxygen was provided by nasal cannula and IV medicine administered through an indwelling cannula.  After administration of sedation, the patients esophagus was intubated and the [id number]  endoscope was advanced under direct visualization to the second portion of the duodenum.  The scope was removed slowly by carefully examining the color, texture, anatomy, and integrity of the mucosa on the way out.  The patient was recovered in endoscopy and discharged home in satisfactory condition.  Estimated blood loss is zero unless otherwise noted in this procedure report.    ESOPHAGUS: A Schatzki ring was found at the gastroesophageal junction and was widely open.   STOMACH: Mild erosive gastritis (inflammation) was found in the gastric antrum.  Multiple biopsies were performed using cold forceps.   DUODENUM: Mild duodenal inflammation was found in the duodenal bulb.   The duodenal mucosa showed no abnormalities in the 2nd part of the duodenum. COMPLICATIONS: There were no immediate complications.  ENDOSCOPIC IMPRESSION: PATENT SCHATZKI'S RING MILD  GASTRITIS/DUODENITIS  RECOMMENDATIONS: NO HEPARIN WITH DIALYSIS UNTIL FEB 18. FOLLOW A HIGH FIBER DIET. START OMEPRAZOLE 30 MINUTES PRIOR TO MEALS BID. AWAIT BIOPSY RESULTS. FOLLOW UP IN 3 MOS.  REPEAT EXAM:  eSigned:  Danie Binder, MD February 27, 2016 7:48 PM Revised: 02/27/2016 7:48 PM   CPT CODES: ICD CODES:  The ICD and CPT codes recommended by this software are interpretations from the data that the clinical staff has captured with the software.  The verification of the translation of this report to the ICD and CPT codes and modifiers is the sole responsibility of the health care institution and practicing physician where this report was generated.  [place]. will not be held responsible for the validity of the ICD and CPT codes included on this report.  AMA assumes no liability for data contained or not contained herein. CPT is a Designer, television/film set of the [employer].

## 2016-02-10 NOTE — Progress Notes (Signed)
Used interpretor from [id number]. Interpretor [id number]. Patient verbalized understanding to interpretor. Interpretor verbalized to nurse.

## 2016-02-10 NOTE — Progress Notes (Signed)
REVIEWED-NO ADDITIONAL RECOMMENDATIONS. 

## 2016-02-11 ENCOUNTER — Encounter (HOSPITAL_COMMUNITY): Payer: Self-pay | Admitting: Gastroenterology

## 2016-02-19 ENCOUNTER — Telehealth: Payer: Self-pay | Admitting: Gastroenterology

## 2016-02-19 ENCOUNTER — Encounter: Payer: Self-pay | Admitting: Gastroenterology

## 2016-02-19 NOTE — Telephone Encounter (Signed)
Please call pt. HER stomach Bx shows gastritis & BENIGN GASTRIC POLYP. Please call pt. She had ONE simple adenomas AND NINE HYPERPLASTIC POLYPS removed.    SHE SHOULD'VE STARTED HEPARIN WITH DIALYSIS FEB 18.  FOLLOW A HIGH FIBER DIET.   START  OMEPRAZOLE.  TAKE 30 MINUTES PRIOR TO YOUR MEALS TWICE DAILY TO TREAT UPPER ABDOMINAL PAIN.  FOLLOW UP IN 3 MOS E30 IH-BANDING/DYSPEPSIA/DYSPHAGIA.  NEXT COLONOSCOPY IN 5-10 YEARS.

## 2016-02-19 NOTE — Telephone Encounter (Signed)
Called and informed pt's daughter, Vernie Shanks.

## 2016-02-19 NOTE — Telephone Encounter (Signed)
Reminders in epic °

## 2016-04-17 ENCOUNTER — Ambulatory Visit: Payer: Self-pay | Attending: Physician Assistant | Admitting: Physician Assistant

## 2016-04-17 ENCOUNTER — Encounter: Payer: Self-pay | Admitting: Physician Assistant

## 2016-04-17 VITALS — BP 103/64 | HR 73 | Temp 98.2°F | Wt 143.6 lb

## 2016-04-17 DIAGNOSIS — G4700 Insomnia, unspecified: Secondary | ICD-10-CM

## 2016-04-17 DIAGNOSIS — N186 End stage renal disease: Secondary | ICD-10-CM

## 2016-04-17 DIAGNOSIS — I10 Essential (primary) hypertension: Secondary | ICD-10-CM

## 2016-04-17 MED ORDER — ETHYL CHLORIDE EX AERO: INHALATION_SPRAY | CUTANEOUS | Status: DC | PRN

## 2016-04-17 MED ORDER — TEMAZEPAM 15 MG PO CAPS
15.0000 mg | ORAL_CAPSULE | Freq: Every evening | ORAL | Status: DC | PRN
Start: 2016-04-17 — End: 2016-08-21

## 2016-04-17 NOTE — Progress Notes (Signed)
Patient ID: Lucilla Lame, female   DOB: 1961/10/21, 55 y.o.   MRN: [id number]   Gillermo Murdoch, is a 55 y.o. female  [id number]  [id number]  DOB - May 21, 1961  Chief Complaint  Patient presents with  . Medication Refill    Pt would like to have the following refilled here: Temazepam; Ethyl Chloride Spray        Subjective:  Chief Complaint and HPI: Gaylyn Cheers Corky Crafts is a 55 y.o. female here today for a RF on restoril which she has used for sleep in the past and has found it helpful.  She also needs an Rx for ethyl chloride spray that is used for her fistula when being hooked up for dialysis.  She goes through dialysis 3X/week.  She sees her kidney specialist regularly (?Dr. Joelyn Oms).  History via stratus interpreter.   ROS:   Constitutional:  No f/c, No night sweats, No unexplained weight loss. EENT:  No vision changes, No blurry vision, No hearing changes. No mouth, throat, or ear problems.  Respiratory: No cough, No SOB Cardiac: No CP, no palpitations GI:  No abd pain, No N/V/D. GU: No Urinary s/sx Musculoskeletal: No joint pain Neuro: No headache, no dizziness, no motor weakness.  Skin: No rash Endocrine:  No polydipsia. No polyuria.  Psych: Denies SI/HI  ALLERGIES: No Known Allergies  PAST MEDICAL HISTORY: Past Medical History  Diagnosis Date  . Hypertension   . Anemia   . UTI (lower urinary tract infection) March 2014  . Renal disorder   . End stage renal disease ([place])     hemodialysis 3x/wk @ [address]  . GERD (gastroesophageal reflux disease)   . Gastritis   . Family history of adverse reaction to anesthesia     sister vomiting after uterine cyst removal  . Complication of anesthesia   . PONV (postoperative nausea and vomiting)   . Hemorrhoids     MEDICATIONS AT HOME: Prior to Admission medications   Medication Sig Start Date End Date Taking? Authorizing Provider  acetaminophen (TYLENOL) 325 MG tablet Take 650 mg by mouth  every 6 (six) hours as needed.   Yes Historical Provider, MD  amLODipine (NORVASC) 5 MG tablet Take 5 mg by mouth daily.   Yes Historical Provider, MD  cholecalciferol (VITAMIN D) 1000 units tablet Take 1,000 Units by mouth daily.   Yes Historical Provider, MD  omeprazole (PRILOSEC) 20 MG capsule 1 PO 30 mins prior to breakfast and supper 02/10/16  Yes Danie Binder, MD  ethyl chloride spray Apply topically as needed. 04/17/16   Argentina Donovan, PA-C  temazepam (RESTORIL) 15 MG capsule Take 1 capsule (15 mg total) by mouth at bedtime as needed for sleep. 04/17/16   Argentina Donovan, PA-C     Objective:  EXAM:   Filed Vitals:   04/17/16 1444  BP: 103/64  Pulse: 73  Temp: 98.2 F (36.8 C)  TempSrc: Oral  Weight: 143 lb 9.6 oz (65.137 kg)    General appearance : A&OX3. NAD. Non-toxic-appearing HEENT: Atraumatic and Normocephalic.  PERRLA. EOM intact.  TM clear B. Mouth-MMM, post pharynx WNL w/o erythema, No PND. Neck: supple, no JVD. No cervical lymphadenopathy. No thyromegaly Chest/Lungs:  Breathing-non-labored, Good air entry bilaterally, breath sounds normal without rales, rhonchi, or wheezing  CVS: S1 S2 regular, no murmurs, gallops, rubs  Fistula in place above L AC.  No sign of infection Extremities: Bilateral Lower Ext shows no edema, both legs are warm to touch with = pulse  throughout Neurology:  CN II-XII grossly intact, Non focal.   Psych:  TP linear. J/I WNL. Normal speech. Appropriate eye contact and affect.  Skin:  No Rash    Assessment & Plan   1. Insomnia - temazepam (RESTORIL) 15 MG capsule; Take 1 capsule (15 mg total) by mouth at bedtime as needed for sleep.  Dispense: 30 capsule; Refill: 0  Ok to call in RF X 3 if this helps her!  It has worked well for her in the past.    2. End stage renal disease ([place]) Continue with dialysis/kidney care plan  3. Essential hypertension Controlled-continue amlodipine  Patient have been counseled extensively about  nutrition and exercise  Return in about 3 months (around 07/17/2016) for for visit with PCP; sooner if needed.  The patient was given clear instructions to go to ER or return to medical center if symptoms don't improve, worsen or new problems develop. The patient verbalized understanding. The patient was told to call to get lab results if they haven't heard anything in the next week.   Freeman Caldron, PA-C [facility] [place], [place]   04/17/2016, 4:10 PM

## 2016-04-17 NOTE — Patient Instructions (Signed)
Insomnio (Insomnia) El insomnio es un trastorno del sueo que causa dificultades para conciliar el sueo o para [place]. Puede producir cansancio (fatiga), falta de energa, dificultad para concentrarse, cambios en el estado de nimo y mal rendimiento escolar o laboral.  Hay tres formas diferentes de clasificar el insomnio:   Dificultad para conciliar el sueo.  Dificultad para mantener el sueo.  Despertar muy precoz por la maana. Cualquier tipo de insomnio puede ser a Barrister's clerk (crnico) o a Control and instrumentation engineer (agudo). Ambos son frecuentes. Generalmente, el insomnio a corto plazo dura tres meses o menos tiempo. El crnico ocurre al menos tres veces por semana durante ms de tres meses. CAUSAS  El insomnio puede deberse a otra afeccin, situacin o sustancia, por ejemplo:  Ansiedad.  Algunos medicamentos.  Enfermedad por reflujo gastroesofgico (ERGE) u otras enfermedades gastrointestinales.  Asma y otras enfermedades respiratorias.  Sndrome de las piernas inquietas, apnea del sueo u otros trastornos del sueo.  Dolor crnico.  Menopausia, que puede incluir calores repentinos.  Ictus.  Consumo excesivo de alcohol, tabaco u drogas ilegales.  Depresin.  Cafena.  Trastornos neurolgicos, como enfermedad de Alzheimer.  Hiperactividad tiroidea (hipertiroidismo). Es posible que la causa del insomnio no se conozca. FACTORES DE RIESGO Los factores de riesgo de tener insomnio incluyen lo siguiente:  El sexo. La mujeres se ven ms afectadas que los hombres.  La edad. El insomnio es ms frecuente a medida que una persona envejece.  El estrs. Esto puede incluir su vida profesional o personal.  Los ingresos. El insomnio es ms frecuente en las personas cuyos ingresos son ms bajos.  La falta de actividad fsica.  Los horarios de trabajo irregulares o los turnos nocturnos.  Los viajes a lugares de diferentes zonas horarias. SIGNOS Y SNTOMAS Si tiene insomnio, el  sntoma principal es la dificultad para conciliar el sueo o mantenerlo. Esto puede derivar en otros sntomas, por ejemplo:  Sentirse fatigado.  Ponerse nervioso por [employer] irse a dormir.  No sentirse descansado por la maana.  Tener dificultad para concentrarse.  Sentirse irritable, ansioso o deprimido. TRATAMIENTO  El tratamiento para el insomnio depende de la causa. Si se debe a una enfermedad preexistente, el tratamiento se centrar en el abordaje de la enfermedad. El tratamiento tambin puede incluir lo siguiente:   Medicamentos que lo ayuden a dormir.  Asesoramiento psicolgico o terapia.  Cambios en el estilo de vida. Calera los medicamentos solamente como se lo haya indicado el mdico.  Establezca horarios habituales para dormir y Clinical cytogeneticist. No tome siestas.  Lleve un registro del sueo ya que podra ser de utilidad para que usted y a su mdico puedan determinar qu podra estar causndole insomnio. Incluya lo siguiente:  Cundo duerme.  Cundo se despierta durante la noche.  Qu tan bien duerme.  Qu tan relajado se siente al da siguiente.  Cualquier efecto secundario de los medicamentos que toma.  Lo que usted come y bebe.  Convierta a su habitacin en un lugar cmodo donde sea fcil conciliar el sueo:  Coloque persianas o cortinas especiales oscuras que impidan la entrada de la luz del exterior.  Para bloquear los ruidos, use un aparato que reproduce sonidos ambientales o relajantes de fondo.  Mantenga baja la temperatura.  Haga ejercicio regularmente como se lo haya indicado el mdico. No haga ejercicio justo antes de la hora de [place].  Utilice tcnicas de relajacin para controlar el estrs. Pdale al mdico que le sugiera  algunas tcnicas que sean adecuadas para usted. Estos pueden incluir lo siguiente:  Ejercicios de respiracin.  Rutinas para aliviar la tensin muscular.  Visualizacin de  escenas apacibles.  Disminucin del consumo de alcohol, bebidas con cafena y cigarrillos, especialmente cerca de la hora de [place], ya que pueden perturbarle el sueo.  No coma en exceso ni consuma comidas picantes justo antes de la hora de [place]. Esto puede causarle molestias digestivas y dificultades para dormir.  Limite el uso de pantallas antes de la hora de [place]. Esto incluye lo siguiente:  Mirar televisin.  Usar el telfono inteligente, la tableta y la computadora.  Siga una rutina. Esto puede ayudarlo a conciliar el sueo ms rpidamente. Intente hacer una actividad tranquila, cepillarse los dientes e irse a la cama a la misma hora todas las noches.  Levntese de la cama si sigue despierto despus de 25minutos de haber intentado dormirse. [place] luces, pero intente leer o hacer una actividad tranquila. Cuando tenga sueo, regrese a Futures trader.  Conduzca con cuidado. No conduzca si est muy somnoliento.  Concurra a todas las visitas de control, segn le indique su mdico. Esto es importante. SOLICITE ATENCIN MDICA SI:   Est cansado durante el da o tiene dificultades en su rutina diaria debido a la somnolencia.  Sigue teniendo problemas para dormir o Press photographer. SOLICITE ATENCIN MDICA DE INMEDIATO SI:   Tiene pensamientos serios acerca de lastimarse a usted mismo o daar a Nurse, children's.   Esta informacin no tiene Marine scientist el consejo del mdico. Asegrese de hacerle al mdico cualquier pregunta que tenga.   Document Released: 12/14/2005 Document Revised: 01/04/2015 Elsevier Interactive Patient Education [employer].

## 2016-05-18 ENCOUNTER — Ambulatory Visit: Payer: Self-pay | Admitting: Gastroenterology

## 2016-05-18 ENCOUNTER — Telehealth: Payer: Self-pay | Admitting: Gastroenterology

## 2016-05-18 ENCOUNTER — Encounter: Payer: Self-pay | Admitting: Gastroenterology

## 2016-05-18 NOTE — Telephone Encounter (Signed)
PATIENT WAS A NO SHOW AND LETTER SENT  °

## 2016-07-29 ENCOUNTER — Encounter (HOSPITAL_COMMUNITY): Payer: Self-pay

## 2016-07-29 ENCOUNTER — Emergency Department (HOSPITAL_COMMUNITY)
Admission: EM | Admit: 2016-07-29 | Discharge: 2016-07-29 | Disposition: A | Discharge status: Home / Self Care | Payer: Self-pay | Attending: Emergency Medicine | Admitting: Emergency Medicine

## 2016-07-29 DIAGNOSIS — Z79899 Other long term (current) drug therapy: Secondary | ICD-10-CM | POA: Insufficient documentation

## 2016-07-29 DIAGNOSIS — I12 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 disease: Secondary | ICD-10-CM | POA: Insufficient documentation

## 2016-07-29 DIAGNOSIS — N186 End stage renal disease: Secondary | ICD-10-CM | POA: Insufficient documentation

## 2016-07-29 DIAGNOSIS — Z992 Dependence on renal dialysis: Secondary | ICD-10-CM | POA: Insufficient documentation

## 2016-07-29 DIAGNOSIS — K2970 Gastritis, unspecified, without bleeding: Secondary | ICD-10-CM | POA: Insufficient documentation

## 2016-07-29 DIAGNOSIS — R1013 Epigastric pain: Secondary | ICD-10-CM

## 2016-07-29 LAB — CBC
HEMATOCRIT: 37.5 % (ref 36.0–46.0)
HEMOGLOBIN: 12.2 g/dL (ref 12.0–15.0)
MCH: 31.6 pg (ref 26.0–34.0)
MCHC: 32.5 g/dL (ref 30.0–36.0)
MCV: 97.2 fL (ref 78.0–100.0)
PLATELETS: 231 10*3/uL (ref 150–400)
RBC: 3.86 MIL/uL — AB (ref 3.87–5.11)
RDW: 14.4 % (ref 11.5–15.5)
WBC: 5.2 10*3/uL (ref 4.0–10.5)

## 2016-07-29 LAB — COMPREHENSIVE METABOLIC PANEL
ALT: 28 U/L (ref 14–54)
AST: 29 U/L (ref 15–41)
Albumin: 4.1 g/dL (ref 3.5–5.0)
Alkaline Phosphatase: 138 U/L — ABNORMAL HIGH (ref 38–126)
Anion gap: 12 (ref 5–15)
BUN: 21 mg/dL — ABNORMAL HIGH (ref 6–20)
CHLORIDE: 99 mmol/L — AB (ref 101–111)
CO2: 27 mmol/L (ref 22–32)
CREATININE: 6.36 mg/dL — AB (ref 0.44–1.00)
Calcium: 10.5 mg/dL — ABNORMAL HIGH (ref 8.9–10.3)
GFR calc non Af Amer: 7 mL/min — ABNORMAL LOW (ref >60)
GFR, EST AFRICAN AMERICAN: 8 mL/min — AB (ref >60)
Glucose, Bld: 91 mg/dL (ref 65–99)
Potassium: 3.1 mmol/L — ABNORMAL LOW (ref 3.5–5.1)
SODIUM: 138 mmol/L (ref 135–145)
Total Bilirubin: 0.7 mg/dL (ref 0.3–1.2)
Total Protein: 7.8 g/dL (ref 6.5–8.1)

## 2016-07-29 LAB — URINALYSIS, ROUTINE W REFLEX MICROSCOPIC
BILIRUBIN URINE: NEGATIVE
GLUCOSE, UA: NEGATIVE mg/dL
Ketones, ur: NEGATIVE mg/dL
Nitrite: NEGATIVE
PH: 8 (ref 5.0–8.0)
Protein, ur: 100 mg/dL — AB
SPECIFIC GRAVITY, URINE: 1.012 (ref 1.005–1.030)

## 2016-07-29 LAB — LIPASE, BLOOD: LIPASE: 49 U/L (ref 11–51)

## 2016-07-29 LAB — URINE MICROSCOPIC-ADD ON

## 2016-07-29 MED ORDER — ONDANSETRON 4 MG PO TBDP
4.0000 mg | ORAL_TABLET | Freq: Once | ORAL | Status: AC
  Administered 2016-07-29: 4 mg via ORAL
  Filled 2016-07-29: qty 1

## 2016-07-29 MED ORDER — PANTOPRAZOLE SODIUM 20 MG PO TBEC
20.0000 mg | DELAYED_RELEASE_TABLET | Freq: Once | ORAL | Status: AC
  Administered 2016-07-29: 20 mg via ORAL
  Filled 2016-07-29: qty 1

## 2016-07-29 MED ORDER — GI COCKTAIL ~~LOC~~
30.0000 mL | Freq: Once | ORAL | Status: AC
  Administered 2016-07-29: 30 mL via ORAL
  Filled 2016-07-29: qty 30

## 2016-07-29 NOTE — ED Notes (Signed)
After drinking fluids, pt. Denies nausea

## 2016-07-29 NOTE — ED Notes (Signed)
D/C instructions reviewed. Pt. Denies pain and verbalizes that she will take her prilosec as directed.

## 2016-07-29 NOTE — ED Provider Notes (Signed)
[facility] DEPT Provider Note   CSN: [id number] Arrival date & time: 07/29/16  [id number]  First Provider Contact:  None       History   Chief Complaint Chief Complaint  Patient presents with  . Abdominal Pain    HPI Sabrina Mitchell is a 55 y.o. female.  HPI   Pt with hx ESRD last dialysis yesterday (Tu,Th,Sa), gastritis p/w burning epigastric pain that began two days ago.  The pain comes and goes and is severe.  She had episodes of vomiting and diarrhea on two days ago.  Associated headache.  Her stool is normally watery and is back to its normal consistency now.  No recent vomiting but does continue to have pain.   Has had colonoscopy and EGD earlier this year and was told she had intestinal polyps and gastritis. Denies fevers, chills, myalgias, chest pain, SOB, urinary, or vaginal symptoms.  She does make urine but states she has had decreased urinary output due to decreased PO intake.    Past Medical History:  Diagnosis Date  . Anemia   . Complication of anesthesia   . End stage renal disease ([place])    hemodialysis 3x/wk @ [address]  . Family history of adverse reaction to anesthesia    sister vomiting after uterine cyst removal  . Gastritis   . GERD (gastroesophageal reflux disease)   . Hemorrhoids   . Hypertension   . PONV (postoperative nausea and vomiting)   . Renal disorder   . UTI (lower urinary tract infection) March 2014    Patient Active Problem List   Diagnosis Date Noted  . Heme + stool   . GERD (gastroesophageal reflux disease) 01/17/2016  . Esophageal dysphagia 01/17/2016  . Abdominal pain, epigastric 01/17/2016  . Guaiac positive stools 11/28/2015  . Colon cancer screening 11/13/2015  . Breast cancer screening 11/13/2015  . 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 Chest pressure 04/05/2013  . Pain in limb 04/05/2013  . End stage renal disease ([place]) 04/05/2013  . Abdominal pain, other specified site  04/05/2013  . Post-menopausal bleeding 04/06/2012  . End stage renal disease on dialysis ([place]) 04/06/2012  . Hypertension 04/06/2012    Past Surgical History:  Procedure Laterality Date  . AV FISTULA PLACEMENT Left 07/05/09   Dr. Kellie Simmering  . COLONOSCOPY N/A 02/10/2016   SLF: 1. HEME postive stool due to colon polyps intrernal hemorrhoids 2. small internal hemrrohoids 3. moderate external hemorrhoids.   . ESOPHAGEAL DILATION N/A 02/10/2016   Procedure: ESOPHAGEAL DILATION;  Surgeon: Danie Binder, MD;  [facility];  Service: Endoscopy;  Laterality: N/A;  . ESOPHAGOGASTRODUODENOSCOPY N/A 02/10/2016   SLF: patent Schatzki's ring , mild gastritis/ duodentitis.   Marland Kitchen FISTULOGRAM N/A 10/22/2014   Procedure: FISTULOGRAM;  Surgeon: Angelia Mould, MD;  Location: [facility] CATH LAB;  Service: Cardiovascular;  Laterality: N/A;  . REVISON OF ARTERIOVENOUS FISTULA Left [id number]   Procedure: PLICATION OF BRACHIOCEPHALIC FISTULA;  Surgeon: Conrad Kempton, MD;  Location: [facility];  Service: Vascular;  Laterality: Left;  . TUBAL LIGATION  2004    OB History    No data available       Home Medications    Prior to Admission medications   Medication Sig Start Date End Date Taking? Authorizing Provider  acetaminophen (TYLENOL) 325 MG tablet Take 650 mg by mouth every 6 (six) hours as needed.    Historical Provider, MD  amLODipine (NORVASC) 5 MG tablet Take 5 mg  by mouth daily.    Historical Provider, MD  cholecalciferol (VITAMIN D) 1000 units tablet Take 1,000 Units by mouth daily.    Historical Provider, MD  ethyl chloride spray Apply topically as needed. 04/17/16   Argentina Donovan, PA-C  omeprazole (PRILOSEC) 20 MG capsule 1 PO 30 mins prior to breakfast and supper 02/10/16   Danie Binder, MD  temazepam (RESTORIL) 15 MG capsule Take 1 capsule (15 mg total) by mouth at bedtime as needed for sleep. 04/17/16   Argentina Donovan, PA-C    Family History Family History  Problem Relation Age of  Onset  . Hypertension Mother   . Hypertension Father   . Hypertension Sister   . Diabetes Brother   . Hypertension Brother     Social History Social History  Substance Use Topics  . Smoking status: Never Smoker  . Smokeless tobacco: Never Used  . Alcohol use No     Allergies   Review of patient's allergies indicates no known allergies.   Review of Systems Review of Systems  All other systems reviewed and are negative.    Physical Exam Updated Vital Signs BP 120/70 (BP Location: Right Arm)   Pulse 74   Temp 98.1 F (36.7 C) (Oral)   Resp 19   Ht 4\' 10"  (1.473 m)   Wt 63.9 kg   SpO2 99%   BMI 29.43 kg/m   Physical Exam  Constitutional: She appears well-developed and well-nourished. No distress.  HENT:  Head: Normocephalic and atraumatic.  Neck: Neck supple.  Cardiovascular: Normal rate and regular rhythm.   Left upper arm fistula with palpable thrill.  No skin changes.    Pulmonary/Chest: Effort normal and breath sounds normal. No respiratory distress. She has no wheezes. She has no rales.  Abdominal: Soft. She exhibits no distension. There is tenderness (epigastric ). There is no rebound and no guarding.  Neurological: She is alert.  Skin: She is not diaphoretic.  Nursing note and vitals reviewed.    ED Treatments / Results  Labs (all labs ordered are listed, but only abnormal results are displayed) Labs Reviewed  COMPREHENSIVE METABOLIC PANEL - Abnormal; Notable for the following:       Result Value   Potassium 3.1 (*)    Chloride 99 (*)    BUN 21 (*)    Creatinine, Ser 6.36 (*)    Calcium 10.5 (*)    Alkaline Phosphatase 138 (*)    GFR calc non Af Amer 7 (*)    GFR calc Af Amer 8 (*)    All other components within normal limits  CBC - Abnormal; Notable for the following:    RBC 3.86 (*)    All other components within normal limits  URINALYSIS, ROUTINE W REFLEX MICROSCOPIC (NOT AT [facility]) - Abnormal; Notable for the following:    APPearance  CLOUDY (*)    Hgb urine dipstick SMALL (*)    Protein, ur 100 (*)    Leukocytes, UA TRACE (*)    All other components within normal limits  URINE MICROSCOPIC-ADD ON - Abnormal; Notable for the following:    Squamous Epithelial / LPF 6-30 (*)    Bacteria, UA RARE (*)    All other components within normal limits  LIPASE, BLOOD    EKG  EKG Interpretation None       Radiology No results found.  Procedures Procedures (including critical care time)  Medications Ordered in ED Medications  gi cocktail (Maalox,Lidocaine,Donnatal) (not administered)  ondansetron (  ZOFRAN-ODT) disintegrating tablet 4 mg (not administered)     Initial Impression / Assessment and Plan / ED Course  I have reviewed the triage vital signs and the nursing notes.  Pertinent labs & imaging results that were available during my care of the patient were reviewed by me and considered in my medical decision making (see chart for details).  Clinical Course    Afebrile, nontoxic patient with epigastric burning x 3 days with occasional N/V.  Has hx gastritis.  Has taken omeprazole once when symptoms present, pt counseled that she needs to take this medication regularly.  Nonsurgical abdominal exam.  Labs baseline, unremarkable.  Pt felt better with GI cocktail.   D/C home with PCP follow up, home omeprazole as previously written.  Discussed result, findings, treatment, and follow up  with patient.  Pt given return precautions.  Pt verbalizes understanding and agrees with plan.       Final Clinical Impressions(s) / ED Diagnoses   Final diagnoses:  Gastritis  Epigastric pain    New Prescriptions Discharge Medication List as of 07/29/2016  1:13 PM       Clayton Bibles, PA-C 07/29/16 1436    Sherwood Gambler, MD 07/30/16 [phone]

## 2016-07-29 NOTE — Discharge Instructions (Signed)
Read the information below.  You may return to the Emergency Department at any time for worsening condition or any new symptoms that concern you.  If you develop high fevers, worsening abdominal pain, uncontrolled vomiting, or are unable to tolerate fluids by mouth, return to the ER for a recheck.    Lea la informacin a continuacin. Usted puede regresar al [facility] en cualquier momento por empeoramiento de la condicin o cualquier nuevo sntoma que le afecte. Si desarrolla fiebres altas, empeora el dolor abdominal, vmitos incontrolados, o es incapaz de Tree surgeon lquidos por la boca, regrese a la sala de emergencias para una revisin.

## 2016-07-29 NOTE — ED Triage Notes (Addendum)
Pt reports abd pain that started Monday described as burning. She took tums which helped but had some n/v/d. Pt also reports slight headache. Pt reports T/Th/Sa dialysis pt, did not miss dialysis yesterday.

## 2016-07-29 NOTE — ED Notes (Signed)
Pt. Given fluid for fluid challenge test

## 2016-07-29 NOTE — ED Notes (Signed)
D/C instructions also reviewed with daughter per patient request

## 2016-08-21 ENCOUNTER — Telehealth: Payer: Self-pay | Admitting: Internal Medicine

## 2016-08-21 ENCOUNTER — Other Ambulatory Visit: Payer: Self-pay | Admitting: *Deleted

## 2016-08-21 DIAGNOSIS — G4700 Insomnia, unspecified: Secondary | ICD-10-CM

## 2016-08-21 MED ORDER — TEMAZEPAM 15 MG PO CAPS: 15.0000 mg | ORAL_CAPSULE | Freq: Every evening | ORAL | 3 refills | Status: DC | PRN

## 2016-08-21 NOTE — Telephone Encounter (Signed)
MA refilled based on PA Mcclung's note.

## 2016-08-21 NOTE — Telephone Encounter (Signed)
Pt came to the office to speak with nurse regarding medication refill. Pt needs a refill for temazepam (RESTORIL) 15 MG capsule. It can be called in to our pharmacy. Pt tried to schedule an appointment but we are booked. She will call on Monday to schedule. Pleas follow up.   Thank you.

## 2016-08-21 NOTE — Telephone Encounter (Signed)
Per PA patient may receive a refill with 3 additional if medication helps. MA refilled patients medication. Prescription will be placed at the front desk upon PA signature. No further questions at this time.

## 2016-08-24 ENCOUNTER — Other Ambulatory Visit: Payer: Self-pay | Admitting: Internal Medicine

## 2016-08-24 DIAGNOSIS — G4700 Insomnia, unspecified: Secondary | ICD-10-CM

## 2016-08-24 MED ORDER — TEMAZEPAM 15 MG PO CAPS
15.0000 mg | ORAL_CAPSULE | Freq: Every evening | ORAL | 3 refills | Status: DC | PRN
Start: 2016-08-24 — End: 2017-03-01

## 2016-09-21 ENCOUNTER — Other Ambulatory Visit: Payer: Self-pay | Admitting: Physician Assistant

## 2016-10-23 ENCOUNTER — Emergency Department (HOSPITAL_COMMUNITY)
Admission: EM | Admit: 2016-10-23 | Discharge: 2016-10-23 | Disposition: A | Discharge status: Home / Self Care | Payer: Self-pay | Attending: Emergency Medicine | Admitting: Emergency Medicine

## 2016-10-23 ENCOUNTER — Encounter (HOSPITAL_COMMUNITY): Payer: Self-pay | Admitting: Emergency Medicine

## 2016-10-23 DIAGNOSIS — L608 Other nail disorders: Secondary | ICD-10-CM | POA: Insufficient documentation

## 2016-10-23 DIAGNOSIS — L609 Nail disorder, unspecified: Secondary | ICD-10-CM

## 2016-10-23 DIAGNOSIS — N186 End stage renal disease: Secondary | ICD-10-CM | POA: Insufficient documentation

## 2016-10-23 DIAGNOSIS — I12 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 disease: Secondary | ICD-10-CM | POA: Insufficient documentation

## 2016-10-23 DIAGNOSIS — Z992 Dependence on renal dialysis: Secondary | ICD-10-CM | POA: Insufficient documentation

## 2016-10-23 NOTE — ED Notes (Signed)
Dr. Lacinda Axon in to evaluate patient.

## 2016-10-23 NOTE — ED Triage Notes (Signed)
Patient is a T, TH, S dialysis patient.  Patient has had some black spots that have come up under her thumb and pinky nail on her right hand.  She describes them as painful.   Patient denies other problem.  Patient was last at dialysis yesterday.

## 2016-10-23 NOTE — Discharge Instructions (Signed)
Return to the Emergency Department immediately if you develop any chest pain, shortness of breath, or fevers.   Take Tylenol as needed for pain.

## 2016-10-23 NOTE — ED Provider Notes (Signed)
[facility] DEPT Provider Note   CSN: [id number] Arrival date & time: 10/23/16  0175     History   Chief Complaint Chief Complaint  Patient presents with  . fingernail issues    HPI Sabrina Mitchell is a 55 y.o. female.  Patient with a history of ESRD on dialysis presents today with a chief complaint of black dots underneath her right thumbnail and her right pinky finger.  She states that she first noticed the dots three days ago.  She states that it is tender.  She has not taken any medication for pain.  She denies any injury to the area.  She reports that her last dialysis was yesterday.  She denies fever, chills, chest pain, SOB, or any other symptoms.        Past Medical History:  Diagnosis Date  . Anemia   . Complication of anesthesia   . End stage renal disease ([place])    hemodialysis 3x/wk @ [address]  . Family history of adverse reaction to anesthesia    sister vomiting after uterine cyst removal  . Gastritis   . GERD (gastroesophageal reflux disease)   . Hemorrhoids   . Hypertension   . PONV (postoperative nausea and vomiting)   . Renal disorder   . UTI (lower urinary tract infection) March 2014    Patient Active Problem List   Diagnosis Date Noted  . Heme + stool   . GERD (gastroesophageal reflux disease) 01/17/2016  . Esophageal dysphagia 01/17/2016  . Abdominal pain, epigastric 01/17/2016  . Guaiac positive stools 11/28/2015  . Colon cancer screening 11/13/2015  . Breast cancer screening 11/13/2015  . 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 Chest pressure 04/05/2013  . Pain in limb 04/05/2013  . End stage renal disease ([place]) 04/05/2013  . Abdominal pain, other specified site 04/05/2013  . Post-menopausal bleeding 04/06/2012  . End stage renal disease on dialysis ([place]) 04/06/2012  . Hypertension 04/06/2012    Past Surgical History:  Procedure Laterality Date  . AV FISTULA PLACEMENT Left 07/05/09   Dr. Kellie Simmering  . COLONOSCOPY N/A 02/10/2016   SLF: 1. HEME postive stool due to colon polyps intrernal hemorrhoids 2. small internal hemrrohoids 3. moderate external hemorrhoids.   . ESOPHAGEAL DILATION N/A 02/10/2016   Procedure: ESOPHAGEAL DILATION;  Surgeon: Danie Binder, MD;  [facility];  Service: Endoscopy;  Laterality: N/A;  . ESOPHAGOGASTRODUODENOSCOPY N/A 02/10/2016   SLF: patent Schatzki's ring , mild gastritis/ duodentitis.   Marland Kitchen FISTULOGRAM N/A 10/22/2014   Procedure: FISTULOGRAM;  Surgeon: Angelia Mould, MD;  Location: [facility] CATH LAB;  Service: Cardiovascular;  Laterality: N/A;  . REVISON OF ARTERIOVENOUS FISTULA Left 10/21/8526   Procedure: PLICATION OF BRACHIOCEPHALIC FISTULA;  Surgeon: Conrad Gholson, MD;  Location: [facility];  Service: Vascular;  Laterality: Left;  . TUBAL LIGATION  2004    OB History    No data available       Home Medications    Prior to Admission medications   Medication Sig Start Date End Date Taking? Authorizing Provider  acetaminophen (TYLENOL) 325 MG tablet Take 650 mg by mouth every 6 (six) hours as needed for mild pain.     Historical Provider, MD  amLODipine (NORVASC) 5 MG tablet Take 5 mg by mouth daily.    Historical Provider, MD  calcium carbonate (TUMS - DOSED IN MG ELEMENTAL CALCIUM) 500 MG chewable tablet Chew 2 tablets by mouth daily as needed for indigestion or heartburn.  Historical Provider, MD  cholecalciferol (VITAMIN D) 1000 units tablet Take 1,000 Units by mouth daily.    Historical Provider, MD  ethyl chloride spray APPLY TOPICALLY AS NEEDED 09/21/16   Tresa Garter, MD  omeprazole (PRILOSEC) 20 MG capsule 1 PO 30 mins prior to breakfast and supper 02/10/16   Danie Binder, MD  temazepam (RESTORIL) 15 MG capsule Take 1 capsule (15 mg total) by mouth at bedtime as needed for sleep. 08/24/16   Tresa Garter, MD    Family History Family History  Problem Relation Age of Onset  . Hypertension Mother   .  Hypertension Father   . Hypertension Sister   . Diabetes Brother   . Hypertension Brother     Social History Social History  Substance Use Topics  . Smoking status: Never Smoker  . Smokeless tobacco: Never Used  . Alcohol use No     Allergies   Review of patient's allergies indicates no known allergies.   Review of Systems Review of Systems  All other systems reviewed and are negative.    Physical Exam Updated Vital Signs BP 136/64 (BP Location: Right Arm)   Pulse 67   Temp 98.5 F (36.9 C) (Oral)   Resp 16   SpO2 99%   Physical Exam  Constitutional: She appears well-developed and well-nourished.  HENT:  Head: Normocephalic and atraumatic.  Cardiovascular: Normal rate, regular rhythm and normal heart sounds.   No murmur heard. Pulmonary/Chest: Effort normal and breath sounds normal.  Musculoskeletal:  Palpable thrill of fistula of the left UE.  No erythema, edema, or warmth of the area.  Neurological: She is alert.  Skin: Skin is warm and dry.  Dark spots under the finger nail of the right thumb and right pinky finger.  Appearance most consistent with splinter hemorrhages.  No erythema, edema, or warmth of the fingers.  No janeway lesions or osler nodes.    Nursing note and vitals reviewed.    ED Treatments / Results  Labs (all labs ordered are listed, but only abnormal results are displayed) Labs Reviewed - No data to display  EKG  EKG Interpretation None       Radiology No results found.  Procedures Procedures (including critical care time)  Medications Ordered in ED Medications - No data to display   Initial Impression / Assessment and Plan / ED Course  I have reviewed the triage vital signs and the nursing notes.  Pertinent labs & imaging results that were available during my care of the patient were reviewed by me and considered in my medical decision making (see chart for details).  Clinical Course   Patient presents today with dark  spots under her thumbnail and pinky finger.  No injury to the area.  Appearance most consistent with splinter hemorrhages.  She is on dialysis and last had dialysis yesterday.  No signs or symptoms of Endocarditis.  She is afebrile.  Feel that the patient is stable for discharge.  Return precautions given.  Patient also evaluated by dr Lacinda Axon who is in agreement with the plan. Final Clinical Impressions(s) / ED Diagnoses   Final diagnoses:  Fingernail abnormalities    New Prescriptions Discharge Medication List as of 10/23/2016  9:32 AM       Hyman Bible, PA-C 10/23/16 1640    Hyman Bible, PA-C 10/23/16 1643    Nat Christen, MD 10/24/16 1733

## 2016-12-03 ENCOUNTER — Other Ambulatory Visit: Payer: Self-pay

## 2016-12-03 NOTE — Progress Notes (Signed)
rec'd request per [place]. To schedule Fistulogram left arm, due to c/o numbness/ tingling left forearm, distal to AVF.  Spoke with nurse at [facility], and Fistulogram sched. For Wed., 12/09/16.  Will fax pt. Instructions to [phone]

## 2016-12-09 ENCOUNTER — Ambulatory Visit (HOSPITAL_COMMUNITY)
Admission: RE | Admit: 2016-12-09 | Discharge: 2016-12-09 | Disposition: A | Discharge status: Home / Self Care | Payer: Self-pay | Source: Ambulatory Visit | Attending: Vascular Surgery | Admitting: Vascular Surgery

## 2016-12-09 ENCOUNTER — Other Ambulatory Visit: Payer: Self-pay | Admitting: *Deleted

## 2016-12-09 ENCOUNTER — Encounter (HOSPITAL_COMMUNITY)
Admission: RE | Disposition: A | Discharge status: Home / Self Care | Payer: Self-pay | Source: Ambulatory Visit | Attending: Vascular Surgery

## 2016-12-09 ENCOUNTER — Encounter (HOSPITAL_COMMUNITY): Payer: Self-pay | Admitting: Vascular Surgery

## 2016-12-09 DIAGNOSIS — Z8719 Personal history of other diseases of the digestive system: Secondary | ICD-10-CM | POA: Insufficient documentation

## 2016-12-09 DIAGNOSIS — T82898A Other specified complication of vascular prosthetic devices, implants and grafts, initial encounter: Secondary | ICD-10-CM

## 2016-12-09 DIAGNOSIS — Y832 Surgical operation with anastomosis, bypass or graft as the cause of abnormal reaction of the patient, or of later complication, without mention of misadventure at the time of the procedure: Secondary | ICD-10-CM | POA: Insufficient documentation

## 2016-12-09 DIAGNOSIS — Z4931 Encounter for adequacy testing for hemodialysis: Secondary | ICD-10-CM

## 2016-12-09 DIAGNOSIS — Z8249 Family history of ischemic heart disease and other diseases of the circulatory system: Secondary | ICD-10-CM | POA: Insufficient documentation

## 2016-12-09 DIAGNOSIS — Z992 Dependence on renal dialysis: Secondary | ICD-10-CM | POA: Insufficient documentation

## 2016-12-09 DIAGNOSIS — Z833 Family history of diabetes mellitus: Secondary | ICD-10-CM | POA: Insufficient documentation

## 2016-12-09 DIAGNOSIS — D631 Anemia in chronic kidney disease: Secondary | ICD-10-CM | POA: Insufficient documentation

## 2016-12-09 DIAGNOSIS — I12 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 disease: Secondary | ICD-10-CM | POA: Insufficient documentation

## 2016-12-09 DIAGNOSIS — N186 End stage renal disease: Secondary | ICD-10-CM | POA: Insufficient documentation

## 2016-12-09 DIAGNOSIS — K219 Gastro-esophageal reflux disease without esophagitis: Secondary | ICD-10-CM | POA: Insufficient documentation

## 2016-12-09 HISTORY — PX: PERIPHERAL VASCULAR CATHETERIZATION: SHX172C

## 2016-12-09 LAB — POCT I-STAT, CHEM 8
BUN: 35 mg/dL — AB (ref 6–20)
CALCIUM ION: 1.28 mmol/L (ref 1.15–1.40)
CREATININE: 6.1 mg/dL — AB (ref 0.44–1.00)
Chloride: 97 mmol/L — ABNORMAL LOW (ref 101–111)
Glucose, Bld: 83 mg/dL (ref 65–99)
HCT: 34 % — ABNORMAL LOW (ref 36.0–46.0)
HEMOGLOBIN: 11.6 g/dL — AB (ref 12.0–15.0)
Potassium: 3.4 mmol/L — ABNORMAL LOW (ref 3.5–5.1)
SODIUM: 141 mmol/L (ref 135–145)
TCO2: 32 mmol/L (ref 0–100)

## 2016-12-09 SURGERY — A/V FISTULAGRAM
Anesthesia: LOCAL | Laterality: Left

## 2016-12-09 MED ORDER — MIDAZOLAM HCL 2 MG/2ML IJ SOLN
INTRAMUSCULAR | Status: DC | PRN
  Administered 2016-12-09: 1 mg via INTRAVENOUS

## 2016-12-09 MED ORDER — LIDOCAINE HCL (PF) 1 % IJ SOLN
INTRAMUSCULAR | Status: AC
  Filled 2016-12-09: qty 30

## 2016-12-09 MED ORDER — FENTANYL CITRATE (PF) 100 MCG/2ML IJ SOLN
INTRAMUSCULAR | Status: DC | PRN
  Administered 2016-12-09: 50 ug via INTRAVENOUS

## 2016-12-09 MED ORDER — MIDAZOLAM HCL 2 MG/2ML IJ SOLN
INTRAMUSCULAR | Status: AC
  Filled 2016-12-09: qty 2

## 2016-12-09 MED ORDER — OXYCODONE-ACETAMINOPHEN 5-325 MG PO TABS
1.0000 | ORAL_TABLET | ORAL | Status: DC | PRN
  Administered 2016-12-09: 1 via ORAL
  Filled 2016-12-09: qty 1

## 2016-12-09 MED ORDER — HEPARIN (PORCINE) IN NACL 2-0.9 UNIT/ML-% IJ SOLN
INTRAMUSCULAR | Status: AC
  Filled 2016-12-09: qty 500

## 2016-12-09 MED ORDER — IODIXANOL 320 MG/ML IV SOLN
INTRAVENOUS | Status: DC | PRN
  Administered 2016-12-09: 30 mL via INTRAVENOUS

## 2016-12-09 MED ORDER — LIDOCAINE HCL (PF) 1 % IJ SOLN
INTRAMUSCULAR | Status: DC | PRN
  Administered 2016-12-09: 5 mL via INTRADERMAL

## 2016-12-09 MED ORDER — HEPARIN (PORCINE) IN NACL 2-0.9 UNIT/ML-% IJ SOLN
INTRAMUSCULAR | Status: DC | PRN
  Administered 2016-12-09: 500 mL

## 2016-12-09 MED ORDER — OXYCODONE-ACETAMINOPHEN 5-325 MG PO TABS
ORAL_TABLET | ORAL | Status: AC
  Filled 2016-12-09: qty 1

## 2016-12-09 MED ORDER — SODIUM CHLORIDE 0.9% FLUSH: 3.0000 mL | INTRAVENOUS | Status: DC | PRN

## 2016-12-09 MED ORDER — FENTANYL CITRATE (PF) 100 MCG/2ML IJ SOLN
INTRAMUSCULAR | Status: AC
  Filled 2016-12-09: qty 2

## 2016-12-09 SURGICAL SUPPLY — 10 items
BAG SNAP BAND KOVER 36X36 (MISCELLANEOUS) ×2 IMPLANT
COVER DOME SNAP 22 D (MISCELLANEOUS) ×2 IMPLANT
COVER PRB 48X5XTLSCP FOLD TPE (BAG) ×1 IMPLANT
COVER PROBE 5X48 (BAG) ×2
KIT MICROINTRODUCER STIFF 5F (SHEATH) ×2 IMPLANT
PROTECTION STATION PRESSURIZED (MISCELLANEOUS) ×2
STATION PROTECTION PRESSURIZED (MISCELLANEOUS) ×1 IMPLANT
STOPCOCK MORSE 400PSI 3WAY (MISCELLANEOUS) ×2 IMPLANT
TRAY PV CATH (CUSTOM PROCEDURE TRAY) ×2 IMPLANT
TUBING CIL FLEX 10 FLL-RA (TUBING) ×2 IMPLANT

## 2016-12-09 NOTE — Op Note (Signed)
    Patient name: Sabrina Mitchell MRN: [id number] DOB: 1961/10/13 Sex: female  12/09/2016 Pre-operative Diagnosis: esrd, pain with dialysis Post-operative diagnosis:  Same Surgeon:  Eda Paschal. Donzetta Matters, MD Procedure Performed: 1.  US guided cannulation of left arm brachiocephalic fistula 2.  fistulogram with central venogram  Indications:  55 year old female dialyzes on Tuesday Thursday Saturday with pain in her left upper extremity after 2 hours of dialysis. 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 She is now indicated for fistulogram.  Findings: There is a kink in the fistula in the proximal aspect but there was brisk runoff centrally without flow-limiting stenoses. Arterial system could not be evaluated from fistulogram given a large side branch that cannot be compressed.   Procedure:  The patient was identified in the holding area and taken to room 8.  The patient was then placed supine on the table and prepped and draped in the usual sterile fashion.  A time out was called.  Ultrasound was used to evaluate the left arm fistula. This was cannulated under ultrasound guidance with micropuncture needle and wire was passed followed by micropuncture sheath. This was secured in place and fistulogram and central venogram performed. Retrograde angiogram demonstrated the above findings and the arterial system could not be evaluated. With this in for Monocryl was used and the micropuncture sheath was removed. Patient did tolerate the procedure well without immediate consultation.  She did have palpable pulses preoperatively is unlikely that there would be an abnormality we could fix from the fistula stick site. I'll have her follow-up in the office with steal study.  I was present and administered 16 minutes of moderate sedation with fentanyl and Versed.  Contrast: 30 mL.   Fama Muenchow C. Donzetta Matters, MD [facility] [place] Office:  [phone] Pager: [phone]

## 2016-12-09 NOTE — Discharge Instructions (Signed)
Introduction _____________[name]_/ Sabrina Mitchell daughter and responsible adult for patient_____________________________________ was treated at our facility. 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  Employee may return to modified work on ______________________. Work activity restrictions Work activities that are not tolerated include: ___Bending ___Prolonged sitting ___Lifting more than ________ lb ___Squatting ___ Prolonged standing ___Climbing ___Reaching ___Pushing and pulling ___ Walking ___Other ______________________ These restrictions are effective until ______________________. Show this Return to Work statement to your supervisor at work as soon as possible. Your employer should be aware of your condition and can help with the necessary work activity restrictions. If you wish to return to work sooner than the date that is listed above, or if you have further problems that make it difficult for you to return at that time, please call our clinic or your health care provider. _____Pam Iris Mitchell, Specialty Coordinator____________________________________ Health Care Provider Name (printed) _________________________________________ Health Care Provider (signature) _________________________________________ Date This information is not intended to replace advice given to you by your health care provider. Make sure you discuss any questions you have with your health care provider. Document Released: 12/14/2005 Document Revised: 07/03/2016 Document Reviewed: 07/13/2014  2017 Elsevier Fistulografa, cuidados posteriores (Fistulogram, Care After) Siga estas instrucciones durante las prximas semanas. Estas indicaciones le proporcionan informacin general acerca de cmo deber cuidarse despus del procedimiento. El mdico tambin podr darle  instrucciones ms especficas. El tratamiento ha sido planificado segn las prcticas mdicas actuales, pero en algunos casos pueden ocurrir problemas. Comunquese con el mdico si tiene algn problema o tiene dudas despus del procedimiento. QU ESPERAR DESPUS DEL PROCEDIMIENTO Despus del procedimiento, es normal tener:  Una pequea molestia en la zona donde se colocaron los catteres.  Un pequeo hematoma alrededor de la fstula.  Somnolencia y Programmer, applications. INSTRUCCIONES PARA EL CUIDADO EN EL HOGAR  Haga reposo en su casa, el da despus del procedimiento.  No conduzca ni opere maquinaria pesada mientras toma analgsicos.  Tome los medicamentos solamente como se lo haya indicado el mdico.  No tome baos de inmersin, no nade ni use el jacuzzi hasta que el mdico lo autorice. Puede ducharse 24horas despus del procedimiento o segn las indicaciones del mdico.  Hay muchas maneras distintas de cerrar y cubrir una incisin, como puntos, pegamento para la piel y tiras Annabella. Siga todas las indicaciones del mdico respecto a lo siguiente:  Cuidados de la herida.  Cambiar y Press photographer el vendaje.  Quitar el cierre de la incisin.  Controle cuidadosamente la fstula de dilisis. SOLICITE ATENCIN MDICA SI:  Tiene secrecin, enrojecimiento, hinchazn o dolor en TEFL teacher de insercin del catter.  Tiene fiebre.  Tiene escalofros. SOLICITE ATENCIN MDICA DE INMEDIATO SI:  Se siente dbil.  Tiene problemas de equilibrio.  Tiene dificultad para mover los brazos o las piernas.  Tiene problemas visuales o para hablar.  Ya no puede sentir una vibracin o un zumbido cuando [employer] dedos sobre la fstula de dilisis.  La extremidad que se [place] para el procedimiento:  Se hincha.  Duele.  Est fra.  Cambia de color, por ejemplo, se torna azulado o blanco plido. Esta informacin no tiene Marine scientist el consejo del mdico. Asegrese de hacerle al mdico cualquier  pregunta que tenga. Document Released: 04/30/2014 Document Revised: 04/30/2014 Document Reviewed: 02/02/2014 Elsevier Interactive Patient Education  2017 [employer].

## 2016-12-09 NOTE — H&P (Signed)
HP  History of Present Illness: This is a 55 y.o. female with esrd. Has pain on dialysis after 2 hours. Last dialysis yesterday. No pain in hand at rest.   Past Medical History:  Diagnosis Date  . Anemia   . Complication of anesthesia   . End stage renal disease ([place])    hemodialysis 3x/wk @ [address]  . Family history of adverse reaction to anesthesia    sister vomiting after uterine cyst removal  . Gastritis   . GERD (gastroesophageal reflux disease)   . Hemorrhoids   . Hypertension   . PONV (postoperative nausea and vomiting)   . Renal disorder   . UTI (lower urinary tract infection) March 2014    Past Surgical History:  Procedure Laterality Date  . AV FISTULA PLACEMENT Left 07/05/09   Dr. Kellie Simmering  . COLONOSCOPY N/A 02/10/2016   SLF: 1. HEME postive stool due to colon polyps intrernal hemorrhoids 2. small internal hemrrohoids 3. moderate external hemorrhoids.   . ESOPHAGEAL DILATION N/A 02/10/2016   Procedure: ESOPHAGEAL DILATION;  Surgeon: Danie Binder, MD;  [facility];  Service: Endoscopy;  Laterality: N/A;  . ESOPHAGOGASTRODUODENOSCOPY N/A 02/10/2016   SLF: patent Schatzki's ring , mild gastritis/ duodentitis.   Marland Kitchen FISTULOGRAM N/A 10/22/2014   Procedure: FISTULOGRAM;  Surgeon: Angelia Mould, MD;  Location: [facility] C[facility] LAB;  Service: Cardiovascular;  Laterality: N/A;  . REVISON OF ARTERIOVENOUS FISTULA Left 02/72/5366   Procedure: PLICATION OF BRACHIOCEPHALIC FISTULA;  Surgeon: Conrad [place]den[place], MD;  Location: [facility];  Service: Vascular;  Laterality: Left;  . TUBAL LIGATION  2004    No Known Allergies  Prior to Admission medications   Medication Sig Start Date End Date Taking? Authorizing Provider  acetaminophen (TYLENOL) 325 MG tablet Take 650 mg by mouth every 6 (six) hours as needed for mild pain.    Yes Historical Provider, MD  amLODipine (NORVASC) 10 MG tablet Take 10 mg by mouth daily.    Yes Historical Provider, MD  calcium carbonate (TUMS -  DOSED IN MG ELEMENTAL CALCIUM) 500 MG chewable tablet Chew 2 tablets by mouth daily as needed for indigestion or heartburn.   Yes Historical Provider, MD  carvedilol (COREG) 6.25 MG tablet Take 6.25 mg by mouth 2 (two) times daily with a meal.   Yes Historical Provider, MD  ethyl chloride spray APPLY TOPICALLY AS NEEDED Patient taking differently: Apply 1 application topically as needed (for dialysis).  09/21/16  Yes Tresa Garter, MD  ferric citrate (AURYXIA) 1 GM 210 MG(Fe) tablet Take 420-630 mg by mouth 3 (three) times daily with meals.   Yes Historical Provider, MD  omeprazole (PRILOSEC) 20 MG capsule 1 PO 30 mins prior to breakfast and supper Patient taking differently: Take 20 mg by mouth 2 (two) times daily as needed (for acid reflux).  02/10/16  Yes Danie Binder, MD  temazepam (RESTORIL) 15 MG capsule Take 1 capsule (15 mg total) by mouth at bedtime as needed for sleep. 08/24/16  Yes Tresa Garter, MD    Social History   Social History  . Marital status: Single    Spouse name: N/A  . Number of children: 2  . Years of education: N/A   Occupational History  . Not on file.   Social History Main Topics  . Smoking status: Never Smoker  . Smokeless tobacco: Never Used  . Alcohol use No  . Drug use: No  . Sexual activity: Not Currently  Birth control/ protection: Surgical   Other Topics Concern  . Not on file   Social History Narrative  . No narrative on file     Family History  Problem Relation Age of Onset  . Hypertension Mother   . Hypertension Father   . Hypertension Sister   . Diabetes Brother   . Hypertension Brother     Physical Examination  Vitals:   12/09/16 0837  BP: 135/73  Pulse: 68  Resp: 18  Temp: 98.8 F (37.1 C)   Body mass index is 29.26 kg/m.  General:  WDWN in NAD Gait: Not observed HENT: WNL, normocephalic Pulmonary: normal non-labored breathing, without Rales, rhonchi,  wheezing Cardiac: palpable left radial  pulse Abdomen: soft, NT/ND, no masses Extremities: pulsatility in left upper arm avf Musculoskeletal: no muscle wasting or atrophy  Neurologic: A&O X 3; Appropriate Affect ; SENSATION: normal; MOTOR FUNCTION:  moving all extremities equally. Speech is fluent/normal  CBC    Component Value Date/Time   WBC 5.2 07/29/2016 0829   RBC 3.86 (L) 07/29/2016 0829   HGB 11.6 (L) 12/09/2016 0910   HCT 34.0 (L) 12/09/2016 0910   PLT 231 07/29/2016 0829   MCV 97.2 07/29/2016 0829   MCH 31.6 07/29/2016 0829   MCHC 32.5 07/29/2016 0829   RDW 14.4 07/29/2016 0829   LYMPHSABS 1.0 02/18/2012 1518   MONOABS 0.4 02/18/2012 1518   EOSABS 0.2 02/18/2012 1518   BASOSABS 0.0 02/18/2012 1518    BMET    Component Value Date/Time   NA 141 12/09/2016 0910   K 3.4 (L) 12/09/2016 0910   CL 97 (L) 12/09/2016 0910   CO2 27 07/29/2016 0829   GLUCOSE 83 12/09/2016 0910   BUN 35 (H) 12/09/2016 0910   CREATININE 6.10 (H) 12/09/2016 0910   CALCIUM 10.5 (H) 07/29/2016 0829   GFRNONAA 7 (L) 07/29/2016 0829   GFRAA 8 (L) 07/29/2016 0829    COAGS: No results found for: INR, PROTIME    ASSESSMENT/PLAN: This is a 55 y.o. female with pain in left arm during dialysis. Proceed with fistulogram and possible intervention. Discussed low likelihood that will improved symptoms today and might require steal study as outpatient.   Rafeal Skibicki C. Donzetta Matters, MD [facility] Office: [phone] Pager: [phone]

## 2017-01-04 ENCOUNTER — Encounter: Payer: Self-pay | Admitting: Vascular Surgery

## 2017-01-07 ENCOUNTER — Ambulatory Visit (HOSPITAL_COMMUNITY)
Admit: 2017-01-07 | Discharge: 2017-01-07 | Disposition: A | Discharge status: Home / Self Care | Payer: Self-pay | Source: Ambulatory Visit | Attending: Vascular Surgery | Admitting: Vascular Surgery

## 2017-01-07 DIAGNOSIS — N186 End stage renal disease: Secondary | ICD-10-CM

## 2017-01-07 DIAGNOSIS — Z4931 Encounter for adequacy testing for hemodialysis: Secondary | ICD-10-CM

## 2017-01-07 DIAGNOSIS — I12 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 disease: Secondary | ICD-10-CM | POA: Insufficient documentation

## 2017-01-08 ENCOUNTER — Encounter: Payer: Self-pay | Admitting: Vascular Surgery

## 2017-01-08 ENCOUNTER — Ambulatory Visit (INDEPENDENT_AMBULATORY_CARE_PROVIDER_SITE_OTHER): Payer: Self-pay | Admitting: Vascular Surgery

## 2017-01-08 VITALS — BP 111/68 | HR 69 | Temp 99.1°F | Resp 18 | Ht <= 58 in | Wt 138.0 lb

## 2017-01-08 DIAGNOSIS — Z992 Dependence on renal dialysis: Secondary | ICD-10-CM

## 2017-01-08 DIAGNOSIS — N186 End stage renal disease: Secondary | ICD-10-CM

## 2017-01-08 NOTE — Progress Notes (Signed)
Patient ID: Sabrina Mitchell, female   DOB: 10-27-1961, 56 y.o.   MRN: [id number]  Reason for Consult: Re-evaluation (4 wk f/u s/p fistulogram)   Referred by Corliss Parish, MD  Subjective:     HPI:  Sabrina Mitchell is a 56 y.o. female pertaining returns following fistulogram of her left upper extremity for pain in her arm while on dialysis. At this time a fistulogram we noted a kink and a large side branch but with brisk runoff centrally and the arteries were inadequately visualized. Since that time she continues to have pain mostly in an ulnar distribution but some also affecting her thumb always occurring while on dialysis. When she is not on dialysis she is able to use her hand and does not have any significant issues. She states that the pain is made better using a glove and other warming techniques while on dialysis. She does not have any tissue loss or ulceration of her left hand.  Past Medical History:  Diagnosis Date  . Anemia   . Complication of anesthesia   . End stage renal disease ([place])    hemodialysis 3x/wk @ [address]  . Family history of adverse reaction to anesthesia    sister vomiting after uterine cyst removal  . Gastritis   . GERD (gastroesophageal reflux disease)   . Hemorrhoids   . Hypertension   . PONV (postoperative nausea and vomiting)   . Renal disorder   . UTI (lower urinary tract infection) March 2014   Family History  Problem Relation Age of Onset  . Hypertension Mother   . Hypertension Father   . Hypertension Sister   . Diabetes Brother   . Hypertension Brother    Past Surgical History:  Procedure Laterality Date  . AV FISTULA PLACEMENT Left 07/05/09   Dr. Kellie Simmering  . COLONOSCOPY N/A 02/10/2016   SLF: 1. HEME postive stool due to colon polyps intrernal hemorrhoids 2. small internal hemrrohoids 3. moderate external hemorrhoids.   . ESOPHAGEAL DILATION N/A 02/10/2016   Procedure: ESOPHAGEAL DILATION;  Surgeon: Danie Binder, MD;  [facility];  Service: Endoscopy;  Laterality: N/A;  . ESOPHAGOGASTRODUODENOSCOPY N/A 02/10/2016   SLF: patent Schatzki's ring , mild gastritis/ duodentitis.   Marland Kitchen FISTULOGRAM N/A 10/22/2014   Procedure: FISTULOGRAM;  Surgeon: Angelia Mould, MD;  Location[facility] CATH LAB;  Service: Cardiovascular;  Laterality: N/A;  . PERIPHERAL VASCULAR CATHETERIZATION Left 12/09/2016   Procedure: A/V Fistulagram;  Surgeon: Waynetta Sandy, MD;  Location: [place] CV LAB;  Service: Cardiovascular;  Laterality: Left;  . REVISON OF ARTERIOVENOUS FISTULA Left 86/76/7209   Procedure: PLICATION OF BRACHIOCEPHALIC FISTULA;  Surgeon: Conrad [place], MD;  Location: [place];  Service: Vascular;  Laterality: Left;  . TUBAL LIGATION  2004    Short Social History:  Social History  Substance Use Topics  . Smoking status: Never Smoker  . Smokeless tobacco: Never Used  . Alcohol use No    No Known Allergies  Current Outpatient Prescriptions  Medication Sig Dispense Refill  . acetaminophen (TYLENOL) 325 MG tablet Take 650 mg by mouth every 6 (six) hours as needed for mild pain.     Marland Kitchen amLODipine (NORVASC) 10 MG tablet Take 10 mg by mouth daily.     . calcium carbonate (TUMS - DOSED IN MG ELEMENTAL CALCIUM) 500 MG chewable tablet Chew 2 tablets by mouth daily as needed for indigestion or heartburn.    . carvedilol (COREG) 6.25 MG tablet  Take 6.25 mg by mouth 2 (two) times daily with a meal.    . ethyl chloride spray APPLY TOPICALLY AS NEEDED (Patient taking differently: Apply 1 application topically as needed (for dialysis). ) 104 mL 0  . ferric citrate (AURYXIA) 1 GM 210 MG(Fe) tablet Take 420-630 mg by mouth 3 (three) times daily with meals.    Marland Kitchen omeprazole (PRILOSEC) 20 MG capsule 1 PO 30 mins prior to breakfast and supper (Patient taking differently: Take 20 mg by mouth 2 (two) times daily as needed (for acid reflux). ) 60 capsule 11  . temazepam (RESTORIL) 15 MG capsule Take 1  capsule (15 mg total) by mouth at bedtime as needed for sleep. 30 capsule 3   No current facility-administered medications for this visit.     Review of Systems  Constitutional:  Constitutional negative. Respiratory: Respiratory negative.  Cardiovascular: Cardiovascular negative.  Musculoskeletal:       Pain in left hand while on dialysis Neurological: Neurological negative.       Objective:  Objective   Vitals:   01/08/17 0936  BP: 111/68  Pulse: 69  Resp: 18  Temp: 99.1 F (37.3 C)  TempSrc: Oral  SpO2: 97%  Weight: 138 lb (62.6 kg)  Height: 4\' 10"  (1.473 m)   Body mass index is 28.84 kg/m.  Physical Exam  Constitutional: She appears well-developed.  HENT:  Head: Normocephalic.  Neck: Normal range of motion.  Cardiovascular:  1+ palpable left radial pulse  Pulmonary/Chest: Effort normal.  Abdominal: Soft. She exhibits no mass.  Musculoskeletal:  Strong thrill left arm avf    Data: Right radial arm compression was 100 mmHg left radial artery is 70.     Assessment/Plan:     56 year old female having pain in left arm on dialysis but otherwise is pain free. She had officially gram demonstrated widely patent fistula with central runoff. The arteries were suboptimally visit visualized during that case. I discussed with her that she is at low risk of severe problems given she has no tissue loss or ulceration and pain only with dialysis. I offered her left upper extremity angiogram given that we do not have a steal study today but the left radial artery pressure of only 70. She does have a palpable left radial pulse and so I think angiogram will be of low yield. This time she wants to continue with conservative measures of warming hand while on dialysis and will call for follow-up on a when necessary basis.     Waynetta Sandy MD [facility]

## 2017-02-12 ENCOUNTER — Telehealth: Payer: Self-pay | Admitting: Nephrology

## 2017-02-12 ENCOUNTER — Emergency Department (HOSPITAL_COMMUNITY)
Admission: EM | Admit: 2017-02-12 | Discharge: 2017-02-12 | Disposition: A | Discharge status: Home / Self Care | Payer: Self-pay | Attending: Emergency Medicine | Admitting: Emergency Medicine

## 2017-02-12 ENCOUNTER — Encounter (HOSPITAL_COMMUNITY): Payer: Self-pay | Admitting: Emergency Medicine

## 2017-02-12 DIAGNOSIS — R202 Paresthesia of skin: Secondary | ICD-10-CM | POA: Insufficient documentation

## 2017-02-12 DIAGNOSIS — Z992 Dependence on renal dialysis: Secondary | ICD-10-CM | POA: Insufficient documentation

## 2017-02-12 DIAGNOSIS — I12 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 disease: Secondary | ICD-10-CM | POA: Insufficient documentation

## 2017-02-12 DIAGNOSIS — N186 End stage renal disease: Secondary | ICD-10-CM | POA: Insufficient documentation

## 2017-02-12 DIAGNOSIS — Z79899 Other long term (current) drug therapy: Secondary | ICD-10-CM | POA: Insufficient documentation

## 2017-02-12 LAB — I-STAT CHEM 8, ED
BUN: 30 mg/dL — ABNORMAL HIGH (ref 6–20)
Calcium, Ion: 1.2 mmol/L (ref 1.15–1.40)
Chloride: 95 mmol/L — ABNORMAL LOW (ref 101–111)
Creatinine, Ser: 6.5 mg/dL — ABNORMAL HIGH (ref 0.44–1.00)
Glucose, Bld: 83 mg/dL (ref 65–99)
HCT: 34 % — ABNORMAL LOW (ref 36.0–46.0)
Hemoglobin: 11.6 g/dL — ABNORMAL LOW (ref 12.0–15.0)
Potassium: 3.2 mmol/L — ABNORMAL LOW (ref 3.5–5.1)
Sodium: 140 mmol/L (ref 135–145)
TCO2: 33 mmol/L (ref 0–100)

## 2017-02-12 NOTE — ED Triage Notes (Signed)
Pt sts numbness to hands and feet x 2 weeks; pt dialysis pt with last dialysis yesterday

## 2017-02-12 NOTE — ED Provider Notes (Signed)
[facility] DEPT Provider Note   CSN: [id number] Arrival date & time: 02/12/17  0845     History   Chief Complaint Chief Complaint  Patient presents with  . Numbness    HPI Sabrina Mitchell is a 56 y.o. female.  HPI   56 year old female presents today with complaints of tingling in her fingers.  Patient has a history of end-stage renal disease on dialysis Tuesday Thursday Saturday.  Patient notes her last dialysis was yesterday.  She notes for the last 2 weeks she has had tingling in her hands and feet.  She describes this as ants crawling on her fingers.  She denies any loss of strength, denies any proximal neurological deficits, no other concerning signs or symptoms for intracranial abnormality.    Past Medical History:  Diagnosis Date  . Anemia   . Complication of anesthesia   . End stage renal disease ([place])    hemodialysis 3x/wk @ [address]  . Family history of adverse reaction to anesthesia    sister vomiting after uterine cyst removal  . Gastritis   . GERD (gastroesophageal reflux disease)   . Hemorrhoids   . Hypertension   . PONV (postoperative nausea and vomiting)   . Renal disorder   . UTI (lower urinary tract infection) March 2014    Patient Active Problem List   Diagnosis Date Noted  . Heme + stool   . GERD (gastroesophageal reflux disease) 01/17/2016  . Esophageal dysphagia 01/17/2016  . Abdominal pain, epigastric 01/17/2016  . Guaiac positive stools 11/28/2015  . Colon cancer screening 11/13/2015  . Breast cancer screening 11/13/2015  . 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 Chest pressure 04/05/2013  . Pain in limb 04/05/2013  . End stage renal disease ([place]) 04/05/2013  . Abdominal pain, other specified site 04/05/2013  . Post-menopausal bleeding 04/06/2012  . End stage renal disease on dialysis ([place]) 04/06/2012  . Hypertension 04/06/2012    Past Surgical History:  Procedure Laterality Date  . AV  FISTULA PLACEMENT Left 07/05/09   Dr. Kellie Simmering  . COLONOSCOPY N/A 02/10/2016   SLF: 1. HEME postive stool due to colon polyps intrernal hemorrhoids 2. small internal hemrrohoids 3. moderate external hemorrhoids.   . ESOPHAGEAL DILATION N/A 02/10/2016   Procedure: ESOPHAGEAL DILATION;  Surgeon: Danie Binder, MD;  [facility];  Service: Endoscopy;  Laterality: N/A;  . ESOPHAGOGASTRODUODENOSCOPY N/A 02/10/2016   SLF: patent Schatzki's ring , mild gastritis/ duodentitis.   Marland Kitchen FISTULOGRAM N/A 10/22/2014   Procedure: FISTULOGRAM;  Surgeon: Angelia Mould, MD;  Location[facility] CATH LAB;  Service: Cardiovascular;  Laterality: N/A;  . PERIPHERAL VASCULAR CATHETERIZATION Left 12/09/2016   Procedure: A/V Fistulagram;  Surgeon: Waynetta Sandy, MD;  Location: [facility] CV LAB;  Service: Cardiovascular;  Laterality: Left;  . REVISON OF ARTERIOVENOUS FISTULA Left 06/22/9484   Procedure: PLICATION OF BRACHIOCEPHALIC FISTULA;  Surgeon: Conrad Huron, MD;  Location: [facility];  Service: Vascular;  Laterality: Left;  . TUBAL LIGATION  2004    OB History    No data available       Home Medications    Prior to Admission medications   Medication Sig Start Date End Date Taking? Authorizing Provider  acetaminophen (TYLENOL) 325 MG tablet Take 650 mg by mouth every 6 (six) hours as needed for mild pain.     Historical Provider, MD  amLODipine (NORVASC) 10 MG tablet Take 10 mg by mouth daily.     Historical  Provider, MD  calcium carbonate (TUMS - DOSED IN MG ELEMENTAL CALCIUM) 500 MG chewable tablet Chew 2 tablets by mouth daily as needed for indigestion or heartburn.    Historical Provider, MD  carvedilol (COREG) 6.25 MG tablet Take 6.25 mg by mouth 2 (two) times daily with a meal.    Historical Provider, MD  ethyl chloride spray APPLY TOPICALLY AS NEEDED Patient taking differently: Apply 1 application topically as needed (for dialysis).  09/21/16   Tresa Garter, MD  ferric citrate  (AURYXIA) 1 GM 210 MG(Fe) tablet Take 420-630 mg by mouth 3 (three) times daily with meals.    Historical Provider, MD  omeprazole (PRILOSEC) 20 MG capsule 1 PO 30 mins prior to breakfast and supper Patient taking differently: Take 20 mg by mouth 2 (two) times daily as needed (for acid reflux).  02/10/16   Danie Binder, MD  temazepam (RESTORIL) 15 MG capsule Take 1 capsule (15 mg total) by mouth at bedtime as needed for sleep. 08/24/16   Tresa Garter, MD    Family History Family History  Problem Relation Age of Onset  . Hypertension Mother   . Hypertension Father   . Hypertension Sister   . Diabetes Brother   . Hypertension Brother     Social History Social History  Substance Use Topics  . Smoking status: Never Smoker  . Smokeless tobacco: Never Used  . Alcohol use No     Allergies   Patient has no known allergies.   Review of Systems Review of Systems  All other systems reviewed and are negative.    Physical Exam Updated Vital Signs BP 118/76 (BP Location: Right Arm)   Pulse 63   Temp 98.7 F (37.1 C) (Oral)   Resp 17   SpO2 98%   Physical Exam  Constitutional: She is oriented to person, place, and time. She appears well-developed and well-nourished.  HENT:  Head: Normocephalic and atraumatic.  Eyes: Conjunctivae are normal. Pupils are equal, round, and reactive to light. Right eye exhibits no discharge. Left eye exhibits no discharge. No scleral icterus.  Neck: Normal range of motion. No JVD present. No tracheal deviation present.  Pulmonary/Chest: Effort normal. No stridor.  Musculoskeletal:  Hands and feet appear normal with no redness, swelling, decreased strength or sensation  No CT or L-spine tenderness to palpation of the back  Neurological: She is alert and oriented to person, place, and time. No cranial nerve deficit or sensory deficit. She exhibits normal muscle tone. Coordination normal.  Psychiatric: She has a normal mood and affect. Her  behavior is normal. Judgment and thought content normal.  Nursing note and vitals reviewed.    ED Treatments / Results  Labs (all labs ordered are listed, but only abnormal results are displayed) Labs Reviewed  I-STAT CHEM 8, ED - Abnormal; Notable for the following:       Result Value   Potassium 3.2 (*)    Chloride 95 (*)    BUN 30 (*)    Creatinine, Ser 6.50 (*)    Hemoglobin 11.6 (*)    HCT 34.0 (*)    All other components within normal limits    EKG  EKG Interpretation None       Radiology No results found.  Procedures Procedures (including critical care time)  Medications Ordered in ED Medications - No data to display   Initial Impression / Assessment and Plan / ED Course  I have reviewed the triage vital signs and the nursing  notes.  Pertinent labs & imaging results that were available during my care of the patient were reviewed by me and considered in my medical decision making (see chart for details).      Final Clinical Impressions(s) / ED Diagnoses   Final diagnoses:  Paresthesia    56 year old female presents today with tingling in her hands and feet.  She has no strength deficits, no other neurological complaints that would indicate TIA, stroke, or any central etiology for her symptoms.  Patient is a end-stage dialysis patient.  This is unlikely caused by derangement of electrolytes, i-STAT Chem-8 will be ordered here to make sure no significant electrolyte abnormalities are present.  Patient will be referred to her primary care provider and referred to neurology for repeat consultation and ongoing management of her symptoms.  Patient verbalized understanding and agreement to today's plan and had no further questions or concerns at the time discharge  New Prescriptions New Prescriptions   No medications on file      Okey Regal, PA-C 02/12/17 [place], MD 02/18/17 1556

## 2017-02-12 NOTE — Telephone Encounter (Signed)
Patient called to request refill for temazepam (RESTORIL) 15 MG capsule. Informed patient that she has not made an appointment with [place] since her last one and they may not refill medication. Pt understood but can't schedule at the moment. Please send it to our pharmacy.    Thank you.

## 2017-02-12 NOTE — Telephone Encounter (Signed)
This is a controlled substance and I cannot authorize, will forward to Dr. Doreene Burke since she has only seen Dr. Lindell Noe and Freeman Caldron in the past and does not have established PCP.

## 2017-02-12 NOTE — Discharge Instructions (Signed)
Please read attached information. If you experience any new or worsening signs or symptoms please return to the emergency room for evaluation. Please follow-up with your primary care provider or specialist as discussed.  °

## 2017-02-19 ENCOUNTER — Ambulatory Visit: Payer: Self-pay | Attending: Internal Medicine

## 2017-03-01 ENCOUNTER — Other Ambulatory Visit: Payer: Self-pay | Admitting: Internal Medicine

## 2017-03-01 DIAGNOSIS — F5101 Primary insomnia: Secondary | ICD-10-CM

## 2017-03-01 MED ORDER — TEMAZEPAM 15 MG PO CAPS: 15.0000 mg | ORAL_CAPSULE | Freq: Every evening | ORAL | 3 refills | Status: DC | PRN

## 2017-03-01 NOTE — Telephone Encounter (Signed)
Refilled

## 2017-03-01 NOTE — Telephone Encounter (Signed)
Prescription placed at the front desk for pickup

## 2017-03-08 ENCOUNTER — Ambulatory Visit: Payer: Self-pay | Admitting: Internal Medicine

## 2017-03-22 ENCOUNTER — Ambulatory Visit: Payer: Self-pay | Admitting: Internal Medicine

## 2017-03-22 ENCOUNTER — Ambulatory Visit: Payer: Self-pay | Attending: Internal Medicine | Admitting: Internal Medicine

## 2017-03-22 VITALS — BP 132/77 | HR 72 | Temp 98.4°F | Resp 18 | Ht 60.0 in | Wt 142.4 lb

## 2017-03-22 DIAGNOSIS — Z1239 Encounter for other screening for malignant neoplasm of breast: Secondary | ICD-10-CM

## 2017-03-22 DIAGNOSIS — Z992 Dependence on renal dialysis: Secondary | ICD-10-CM | POA: Insufficient documentation

## 2017-03-22 DIAGNOSIS — F329 Major depressive disorder, single episode, unspecified: Secondary | ICD-10-CM | POA: Insufficient documentation

## 2017-03-22 DIAGNOSIS — N186 End stage renal disease: Secondary | ICD-10-CM | POA: Insufficient documentation

## 2017-03-22 DIAGNOSIS — Z114 Encounter for screening for human immunodeficiency virus [HIV]: Secondary | ICD-10-CM | POA: Insufficient documentation

## 2017-03-22 DIAGNOSIS — G4700 Insomnia, unspecified: Secondary | ICD-10-CM | POA: Insufficient documentation

## 2017-03-22 DIAGNOSIS — M791 Myalgia: Secondary | ICD-10-CM | POA: Insufficient documentation

## 2017-03-22 DIAGNOSIS — Z87440 Personal history of urinary (tract) infections: Secondary | ICD-10-CM | POA: Insufficient documentation

## 2017-03-22 DIAGNOSIS — R400 Somnolence: Secondary | ICD-10-CM | POA: Insufficient documentation

## 2017-03-22 DIAGNOSIS — Z833 Family history of diabetes mellitus: Secondary | ICD-10-CM | POA: Insufficient documentation

## 2017-03-22 DIAGNOSIS — I10 Essential (primary) hypertension: Secondary | ICD-10-CM

## 2017-03-22 DIAGNOSIS — E079 Disorder of thyroid, unspecified: Secondary | ICD-10-CM | POA: Insufficient documentation

## 2017-03-22 DIAGNOSIS — Z1231 Encounter for screening mammogram for malignant neoplasm of breast: Secondary | ICD-10-CM

## 2017-03-22 DIAGNOSIS — I12 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 disease: Secondary | ICD-10-CM | POA: Insufficient documentation

## 2017-03-22 DIAGNOSIS — Z8249 Family history of ischemic heart disease and other diseases of the circulatory system: Secondary | ICD-10-CM | POA: Insufficient documentation

## 2017-03-22 DIAGNOSIS — D631 Anemia in chronic kidney disease: Secondary | ICD-10-CM | POA: Insufficient documentation

## 2017-03-22 DIAGNOSIS — Z131 Encounter for screening for diabetes mellitus: Secondary | ICD-10-CM | POA: Insufficient documentation

## 2017-03-22 DIAGNOSIS — K219 Gastro-esophageal reflux disease without esophagitis: Secondary | ICD-10-CM | POA: Insufficient documentation

## 2017-03-22 DIAGNOSIS — Z1321 Encounter for screening for nutritional disorder: Secondary | ICD-10-CM | POA: Insufficient documentation

## 2017-03-22 DIAGNOSIS — Z1329 Encounter for screening for other suspected endocrine disorder: Secondary | ICD-10-CM | POA: Insufficient documentation

## 2017-03-22 MED ORDER — AMITRIPTYLINE HCL 50 MG PO TABS: 50.0000 mg | ORAL_TABLET | Freq: Every day | ORAL | 3 refills | Status: DC

## 2017-03-22 NOTE — Patient Instructions (Addendum)
Needs pap smear - make appt.   Insomnio (Insomnia) El insomnio es un trastorno del sueo que causa dificultades para conciliar el sueo o para [place]. Puede producir cansancio (fatiga), falta de energa, dificultad para concentrarse, cambios en el estado de nimo y mal rendimiento escolar o laboral. Hay tres formas diferentes de clasificar el insomnio:  Dificultad para conciliar el sueo.  Dificultad para mantener el sueo.  Despertar muy precoz por la maana. Cualquier tipo de insomnio puede ser a Barrister's clerk (crnico) o a Control and instrumentation engineer (agudo). Ambos son frecuentes. Generalmente, el insomnio a corto plazo dura tres meses o menos tiempo. El crnico ocurre al menos tres veces por semana durante ms de tres meses. CAUSAS El insomnio puede deberse a otra afeccin, situacin o sustancia, por ejemplo:  Ansiedad.  Algunos medicamentos.  Enfermedad por reflujo gastroesofgico (ERGE) u otras enfermedades gastrointestinales.  Asma y otras enfermedades respiratorias.  Sndrome de las piernas inquietas, apnea del sueo u otros trastornos del sueo.  Dolor crnico.  Menopausia, que puede incluir calores repentinos.  Ictus.  Consumo excesivo de alcohol, tabaco u drogas ilegales.  Depresin.  Cafena.  Trastornos neurolgicos, como enfermedad de Alzheimer.  Hiperactividad tiroidea (hipertiroidismo). Es posible que la causa del insomnio no se conozca. FACTORES DE RIESGO Los factores de riesgo de tener insomnio incluyen lo siguiente:  El sexo. La mujeres se ven ms afectadas que los hombres.  La edad. El insomnio es ms frecuente a medida que una persona envejece.  El estrs. Esto puede incluir su vida profesional o personal.  Los ingresos. El insomnio es ms frecuente en las personas cuyos ingresos son ms bajos.  La falta de actividad fsica.  Los horarios de trabajo irregulares o los turnos nocturnos.  Los viajes a lugares de diferentes zonas horarias. SIGNOS Y  SNTOMAS Si tiene insomnio, el sntoma principal es la dificultad para conciliar el sueo o mantenerlo. Esto puede derivar en otros sntomas, por ejemplo:  Sentirse fatigado.  Ponerse nervioso por [employer] irse a dormir.  No sentirse descansado por la maana.  Tener dificultad para concentrarse.  Sentirse irritable, ansioso o deprimido. TRATAMIENTO El tratamiento para el insomnio depende de la causa. Si se debe a una enfermedad preexistente, el tratamiento se centrar en el abordaje de la enfermedad. El tratamiento tambin puede incluir lo siguiente:  Medicamentos que lo ayuden a dormir.  Asesoramiento psicolgico o terapia.  Cambios en el estilo de vida. [place] los medicamentos solamente como se lo haya indicado el mdico.  Establezca horarios habituales para dormir y Clinical cytogeneticist. No tome siestas.  Lleve un registro del sueo ya que podra ser de utilidad para que usted y a su mdico puedan determinar qu podra estar causndole insomnio. Incluya lo siguiente:  Cundo duerme.  Cundo se despierta durante la noche.  Qu tan bien duerme.  Qu tan relajado se siente al da siguiente.  Cualquier efecto secundario de los medicamentos que toma.  Lo que usted come y bebe.  Convierta a su habitacin en un lugar cmodo donde sea fcil conciliar el sueo:  Coloque persianas o cortinas especiales oscuras que impidan la entrada de la luz del exterior.  Para bloquear los ruidos, use un aparato que reproduce sonidos ambientales o relajantes de fondo.  Mantenga baja la temperatura.  Haga ejercicio regularmente como se lo haya indicado el mdico. No haga ejercicio justo antes de la hora de [place].  Utilice tcnicas de relajacin para controlar el estrs. Pdale al mdico que  le sugiera algunas tcnicas que sean adecuadas para usted. Estos pueden incluir lo siguiente:  Ejercicios de respiracin.  Rutinas para aliviar la tensin  muscular.  Visualizacin de escenas apacibles.  Disminucin del consumo de alcohol, bebidas con cafena y cigarrillos, especialmente cerca de la hora de [place], ya que pueden perturbarle el sueo.  No coma en exceso ni consuma comidas picantes justo antes de la hora de [place]. Esto puede causarle molestias digestivas y dificultades para dormir.  Limite el uso de pantallas antes de la hora de [place]. Esto incluye lo siguiente:  Mirar televisin.  Usar el telfono inteligente, la tableta y la computadora.  Siga una rutina. Esto puede ayudarlo a conciliar el sueo ms rpidamente. Intente hacer una actividad tranquila, cepillarse los dientes e irse a la cama a la misma hora todas las noches.  Levntese de la cama si sigue despierto despus de 65minutos de haber intentado dormirse. [place] luces, pero intente leer o hacer una actividad tranquila. Cuando tenga sueo, regrese a Futures trader.  Conduzca con cuidado. No conduzca si est muy somnoliento.  Concurra a todas las visitas de control, segn le indique su mdico. Esto es importante. SOLICITE ATENCIN MDICA SI:  Est cansado durante el da o tiene dificultades en su rutina diaria debido a la somnolencia.  Sigue teniendo problemas para dormir o Press photographer. SOLICITE ATENCIN MDICA DE INMEDIATO SI:  Tiene pensamientos serios acerca de lastimarse a usted mismo o daar a Nurse, children's. Esta informacin no tiene Marine scientist el consejo del mdico. Asegrese de hacerle al mdico cualquier pregunta que tenga. Document Released: 12/14/2005 Document Revised: 01/04/2015 Document Reviewed: 09/14/2014 Elsevier Interactive Patient Education  2017 [place] de alimentos para pacientes con reflujo gastroesofgico - Adultos (Food Choices for Gastroesophageal Reflux Disease, Adult) Cuando se tiene reflujo gastroesofgico (ERGE), los alimentos que se ingieren y los hbitos de alimentacin son Museum/gallery curator. Elegir los alimentos adecuados puede ayudar a [employer]. [place]? Elija las frutas, los vegetales, los cereales integrales y los productos lcteos con bajo contenido de [place]. [place], de pescado y de ave con bajo contenido de grasas. Limite las grasas, como los [place], los aderezos para [place], la [place], los frutos secos y Publishing copy. Lleve un registro de alimentos. Esto ayuda a identificar los alimentos que ocasionan sntomas. Evite los alimentos que le ocasionen sntomas. Pueden ser distintos para cada persona. Haga comidas pequeas durante Psychiatrist de 3 comidas abundantes. Coma lentamente, en un lugar donde est distendido. Limite el consumo de alimentos fritos. Cocine los alimentos utilizando mtodos que no sean la fritura. Evite el consumo alcohol. Evite beber grandes cantidades de lquidos con las comidas. Evite agacharse o recostarse hasta despus de 2 o 3horas de haber comido. QU ALIMENTOS NO SE RECOMIENDAN? Estos son algunos alimentos y bebidas que pueden empeorar los sntomas: Astronomer. Jugo de tomate. Salsa de tomate y espagueti. Ajes. [place] y [place]. Rbano picante. Frutas [place], pomelos y limn (fruta y [place]). Carnes Carnes de [place], de pescado y de ave con gran contenido de grasas. Esto incluye los perros calientes, las [place], el [place], la salchicha, el salame y el tocino. Lcteos [place] entera y [place]. [employer]. Crema. [place]. Helados. Queso crema. Bebidas T o caf. Bebidas gaseosas o bebidas energizantes. Condimentos Salsa picante. Salsa barbacoa. Dulces/postres Chocolate y cacao. [place]. Menta y mentol. Grasas y [employer]. Esto incluye las papas fritas.  Otros Vinagre. Especias picantes. Esto incluye la pimienta negra, la pimienta blanca, la pimienta roja, la pimienta de cayena, el curry en polvo, los clavos de [place], el jengibre y el  [place] en polvo. Esta no es [employer] de los alimentos y las bebidas que se Higher education careers adviser. Comunquese con el nutricionista para recibir ms informacin. Esta informacin no tiene Marine scientist el consejo del mdico. Asegrese de hacerle al mdico cualquier pregunta que tenga. Document Released: 06/14/2012 Document Revised: 01/04/2015 Document Reviewed: 10/18/2013 Elsevier Interactive Patient Education  2017 [employer].

## 2017-03-22 NOTE — Progress Notes (Signed)
Sabrina Mitchell, is a 56 y.o. female  OHY:[id number]  RSW:[id number]  DOB - December 04, 1961  CC:  Chief Complaint  Patient presents with  . Establish Care       HPI: Sabrina Mitchell is a 57 y.o. female here today to establish medical care, last seen 4/17 w/ PA for insomnia.  Pt has esrd on HD T/T/Sat.  Well controlled htn.  She co of insomnia, has not slept in 2 days and very tired currently, c/o of dry eyes due to lack of sleep.  - she was taking restoril 15qhs in past, but notes that it only caused her to feel slightly tired, did not go to sleep completely. She does snore and c/o of daytime somnolence. No prior sleep studies.  Feels anxious/tired/depressed sometimes due to her medical illness, will cry at times, but denies si/hi/avh, no prior dx of depression.  Patient has No headache, No chest pain, No abdominal pain - No Nausea, No new weakness tingling or numbness, No Cough - SOB.  Gets heartburn sometimes, and takes ppi as needed, but not daily. She mentions body arthralgias at times, sometimes legs/arms, etc.  She states she think she had flu shot and "prevnar" shot recently, but not certain.  She is here w/ her older dgt who is helping to interpret, and her young granddgt.   Review of Systems: Per hpi, o/w all systems reviewed and negative.    No Known Allergies Past Medical History:  Diagnosis Date  . Anemia   . Complication of anesthesia   . End stage renal disease ([place])    hemodialysis 3x/wk @ [address]  . Family history of adverse reaction to anesthesia    sister vomiting after uterine cyst removal  . Gastritis   . GERD (gastroesophageal reflux disease)   . Hemorrhoids   . Hypertension   . PONV (postoperative nausea and vomiting)   . Renal disorder   . UTI (lower urinary tract infection) March 2014   Current Outpatient Prescriptions on File Prior to Visit  Medication Sig Dispense Refill  . acetaminophen (TYLENOL) 325 MG tablet Take 650 mg  by mouth every 6 (six) hours as needed for mild pain.     Marland Kitchen amLODipine (NORVASC) 10 MG tablet Take 5 mg by mouth 2 (two) times daily.    . calcium carbonate (TUMS - DOSED IN MG ELEMENTAL CALCIUM) 500 MG chewable tablet Chew 2 tablets by mouth daily as needed for indigestion or heartburn.    . carvedilol (COREG) 6.25 MG tablet Take 3.125 mg by mouth 2 (two) times daily with a meal. Pt states takes 1/2 tab bid    . ethyl chloride spray APPLY TOPICALLY AS NEEDED (Patient taking differently: Apply 1 application topically as needed (for dialysis). ) 104 mL 0  . ferric citrate (AURYXIA) 1 GM 210 MG(Fe) tablet Take 420-630 mg by mouth 3 (three) times daily with meals.    Marland Kitchen omeprazole (PRILOSEC) 20 MG capsule 1 PO 30 mins prior to breakfast and supper (Patient taking differently: Take 20 mg by mouth 2 (two) times daily as needed (for acid reflux). ) 60 capsule 11   No current facility-administered medications on file prior to visit.    Family History  Problem Relation Age of Onset  . Hypertension Mother   . Hypertension Father   . Hypertension Sister   . Diabetes Brother   . Hypertension Brother    Social History   Social History  . Marital status: Single    Spouse  name: N/A  . Number of children: 2  . Years of education: N/A   Occupational History  . Not on file.   Social History Main Topics  . Smoking status: Never Smoker  . Smokeless tobacco: Never Used  . Alcohol use No  . Drug use: No  . Sexual activity: Not Currently    Birth control/ protection: Surgical   Other Topics Concern  . Not on file   Social History Narrative  . No narrative on file    Objective:   Vitals:   03/22/17 1537  BP: 132/77  Pulse: 72  Resp: 18  Temp: 98.4 F (36.9 C)    Filed Weights   03/22/17 1537  Weight: 142 lb 6.4 oz (64.6 kg)    BP Readings from Last 3 Encounters:  03/22/17 132/77  02/12/17 123/90  01/08/17 111/68    Physical Exam: Constitutional: Patient appears  well-developed and well-nourished. No distress. AAOx3, pleasant. HENT: Normocephalic, atraumatic, External right and left ear normal. Oropharynx is clear and moist.  Eyes: Conjunctivae and EOM are normal. PERRL, no scleral icterus. Neck: Normal ROM. Neck supple. No JVD.  CVS: RRR, S1/S2 +, no murmurs, no gallops, no carotid bruit.  Pulmonary: Effort and breath sounds normal, no stridor, rhonchi, wheezes, rales.  Abdominal: Soft. BS +, obese, no distension, tenderness, rebound or guarding.  Musculoskeletal: Normal range of motion. No edema and no tenderness.  LE: bilat/ no c/c/e, pulses 2+ bilateral.  Good thrill lue avf. Neuro: Alert.  muscle tone coordination wnl. No cranial nerve deficit grossly. Skin: Skin is warm and dry. No rash noted. Not diaphoretic. No erythema. No pallor. Psychiatric: Normal mood and affect. Behavior, judgment, thought content normal.  Lab Results  Component Value Date   WBC 5.2 07/29/2016   HGB 11.6 (L) 02/12/2017   HCT 34.0 (L) 02/12/2017   MCV 97.2 07/29/2016   PLT 231 07/29/2016   Lab Results  Component Value Date   CREATININE 6.50 (H) 02/12/2017   BUN 30 (H) 02/12/2017   NA 140 02/12/2017   K 3.2 (L) 02/12/2017   CL 95 (L) 02/12/2017   CO2 27 07/29/2016    No results found for: HGBA1C Lipid Panel     Component Value Date/Time   CHOL 167 10/22/2010 2104   TRIG 149 10/22/2010 2104   HDL 59 10/22/2010 2104   CHOLHDL 2.8 Ratio 10/22/2010 2104   VLDL 30 10/22/2010 2104   LDLCALC 78 10/22/2010 2104        Depression screen PHQ 2/9 03/22/2017 11/13/2015  Decreased Interest 2 0  Down, Depressed, Hopeless 2 0  PHQ - 2 Score 4 0  Altered sleeping 1 -  Tired, decreased energy 1 -  Change in appetite 3 -  Trouble concentrating 0 -  Moving slowly or fidgety/restless 0 -  Suicidal thoughts 0 -  PHQ-9 Score 9 -    Assessment and plan:   1. Essential hypertension Well controlled, she does not know what doses of meds she is on, takes 1/2 tab  norvasc bid and 1/2 tab coreg bid.  2. End stage renal disease on dialysis ([place]) Hd t/t/sat., has - Lipid Panel - Hepatitis C antibody - HIV antibody (with reflex) - suspect already had vaccines  And screenings at [facility] office, but we do not have records of this in system.  3. Thyroid disorder screen - TSH  4. Gastroesophageal reflux disease, esophagitis presence not specified gerd diet info provided, prn ppi.  5. Insomnia, unspecified type, r/o osa  Trial elavil 50mg  qhs, would work for both insomnia and anxiety/depression., would prefer this over dependence of restoril.  6. Diabetes mellitus screening - Hemoglobin A1c  7. Encounter for vitamin deficiency screening - VITAMIN D 25 Hydroxy (Vit-D Deficiency, Fractures)  8. Daytime somnolence, +snoring, r/o osa - Split night study; Future  9. Arthralgias -2nd to fluid shifts from hd? - will chk tsh/vit d/dm screening for complete wkup.  10. Asked pt to bring in vaccine records at next visit.  11. healthmaintenae MM - ordered for breast cancer screening Needs papsmear as well - encouraged to make f/u appt.  Return in about 4 weeks (around 04/19/2017) for papsmear.  The patient was given clear instructions to go to ER or return to medical center if symptoms don't improve, worsen or new problems develop. The patient verbalized understanding. The patient was told to call to get lab results if they haven't heard anything in the next week.    This note has been created with Surveyor, quantity. Any transcriptional errors are unintentional.   Maren Reamer, MD, [place], [place]   03/22/2017, 3:56 PM

## 2017-03-23 ENCOUNTER — Other Ambulatory Visit: Payer: Self-pay | Admitting: Internal Medicine

## 2017-03-23 LAB — VITAMIN D 25 HYDROXY (VIT D DEFICIENCY, FRACTURES): Vit D, 25-Hydroxy: 10 ng/mL — ABNORMAL LOW (ref 30.0–100.0)

## 2017-03-23 LAB — LIPID PANEL
Chol/HDL Ratio: 3.5 ratio units (ref 0.0–4.4)
Cholesterol, Total: 194 mg/dL (ref 100–199)
HDL: 56 mg/dL (ref >39)
LDL CALC: 89 mg/dL (ref 0–99)
TRIGLYCERIDES: 247 mg/dL — AB (ref 0–149)
VLDL Cholesterol Cal: 49 mg/dL — ABNORMAL HIGH (ref 5–40)

## 2017-03-23 LAB — HEMOGLOBIN A1C
ESTIMATED AVERAGE GLUCOSE: 91 mg/dL
Hgb A1c MFr Bld: 4.8 % (ref 4.8–5.6)

## 2017-03-23 LAB — TSH: TSH: 1.81 u[IU]/mL (ref 0.450–4.500)

## 2017-03-23 LAB — HIV ANTIBODY (ROUTINE TESTING W REFLEX): HIV SCREEN 4TH GENERATION: NONREACTIVE

## 2017-03-23 LAB — HEPATITIS C ANTIBODY

## 2017-03-23 MED ORDER — VITAMIN D (ERGOCALCIFEROL) 1.25 MG (50000 UNIT) PO CAPS: 50000.0000 [IU] | ORAL_CAPSULE | ORAL | 0 refills | Status: DC

## 2017-03-25 ENCOUNTER — Telehealth: Payer: Self-pay

## 2017-03-25 NOTE — Telephone Encounter (Signed)
-----   Message from Maren Reamer, MD sent at 03/23/2017  8:27 AM EDT ----- Please call. Her cholesterol is borderline ok.  To address this please limit saturated fat to no more than 7% of your calories, limit cholesterol to 200 mg/day, increase fiber and exercise as tolerated. If needed we may add a cholesterol lowering medication to your regimen. Her thyroid was normal. Her vitamin D was very low. Please call.  Vit D very low. Very low vit d, can cause bone/muscle pain.  rx for Vit D replacement in chart, take weekly.  Once done, buy OTC Vit D 5,000 IU and take daily.  No diabetes and hiv on screening. thanks

## 2017-03-25 NOTE — Telephone Encounter (Signed)
CMA call to inform patient about lab results  Patient did not answer but left a VM stating the reason of the call & to call me back

## 2017-05-13 ENCOUNTER — Encounter: Payer: Self-pay | Admitting: Internal Medicine

## 2017-05-14 ENCOUNTER — Encounter: Payer: Self-pay | Admitting: Internal Medicine

## 2017-05-17 ENCOUNTER — Encounter: Payer: Self-pay | Admitting: Internal Medicine

## 2017-07-24 ENCOUNTER — Emergency Department (HOSPITAL_COMMUNITY)
Admission: EM | Admit: 2017-07-24 | Discharge: 2017-07-25 | Disposition: A | Discharge status: Home / Self Care | Payer: Self-pay | Attending: Emergency Medicine | Admitting: Emergency Medicine

## 2017-07-24 ENCOUNTER — Encounter (HOSPITAL_COMMUNITY): Payer: Self-pay | Admitting: Emergency Medicine

## 2017-07-24 DIAGNOSIS — I12 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 disease: Secondary | ICD-10-CM | POA: Insufficient documentation

## 2017-07-24 DIAGNOSIS — Z79899 Other long term (current) drug therapy: Secondary | ICD-10-CM | POA: Insufficient documentation

## 2017-07-24 DIAGNOSIS — N186 End stage renal disease: Secondary | ICD-10-CM | POA: Insufficient documentation

## 2017-07-24 DIAGNOSIS — R1033 Periumbilical pain: Secondary | ICD-10-CM | POA: Insufficient documentation

## 2017-07-24 DIAGNOSIS — Z992 Dependence on renal dialysis: Secondary | ICD-10-CM | POA: Insufficient documentation

## 2017-07-24 LAB — COMPREHENSIVE METABOLIC PANEL
ALBUMIN: 4.1 g/dL (ref 3.5–5.0)
ALK PHOS: 101 U/L (ref 38–126)
ALT: 16 U/L (ref 14–54)
ANION GAP: 11 (ref 5–15)
AST: 24 U/L (ref 15–41)
BUN: 9 mg/dL (ref 6–20)
CALCIUM: 9.4 mg/dL (ref 8.9–10.3)
CO2: 32 mmol/L (ref 22–32)
Chloride: 93 mmol/L — ABNORMAL LOW (ref 101–111)
Creatinine, Ser: 3.57 mg/dL — ABNORMAL HIGH (ref 0.44–1.00)
GFR calc Af Amer: 16 mL/min — ABNORMAL LOW (ref >60)
GFR calc non Af Amer: 13 mL/min — ABNORMAL LOW (ref >60)
GLUCOSE: 119 mg/dL — AB (ref 65–99)
Potassium: 2.8 mmol/L — ABNORMAL LOW (ref 3.5–5.1)
SODIUM: 136 mmol/L (ref 135–145)
Total Bilirubin: 0.8 mg/dL (ref 0.3–1.2)
Total Protein: 7.7 g/dL (ref 6.5–8.1)

## 2017-07-24 LAB — CBC
HEMATOCRIT: 33.1 % — AB (ref 36.0–46.0)
HEMOGLOBIN: 10.9 g/dL — AB (ref 12.0–15.0)
MCH: 31 pg (ref 26.0–34.0)
MCHC: 32.9 g/dL (ref 30.0–36.0)
MCV: 94 fL (ref 78.0–100.0)
Platelets: 181 10*3/uL (ref 150–400)
RBC: 3.52 MIL/uL — ABNORMAL LOW (ref 3.87–5.11)
RDW: 16 % — ABNORMAL HIGH (ref 11.5–15.5)
WBC: 7.3 10*3/uL (ref 4.0–10.5)

## 2017-07-24 LAB — LIPASE, BLOOD: Lipase: 44 U/L (ref 11–51)

## 2017-07-24 MED ORDER — GI COCKTAIL ~~LOC~~
30.0000 mL | Freq: Once | ORAL | Status: AC
  Administered 2017-07-25: 30 mL via ORAL
  Filled 2017-07-24: qty 30

## 2017-07-24 NOTE — ED Notes (Signed)
EDP at bedside  

## 2017-07-24 NOTE — ED Provider Notes (Signed)
[facility] DEPT Provider Note   CSN: [id number] Arrival date & time: 07/24/17  1901     History   Chief Complaint Chief Complaint  Patient presents with  . Abdominal Pain    HPI Sabrina Mitchell is a 56 y.o. female.  Patient with history of ESRD-HD (T, Th, S), HTN, presents with complaint of recurrent abdominal pain in the periumbilical abdomen. The pain started yesterday morning and has been intermittent since that time. At times she has experienced nausea but no vomiting at any time. No diarrhea or constipation. Last bowel movement was this morning and did not change the pain. No SOB, CP. She has had similar symptoms in the past that come and go every several months. No previous abdominal surgeries. She last dialyzed today.    The history is provided by the patient and a relative. A language interpreter was used (Family at bedside acting as interpreter).    Past Medical History:  Diagnosis Date  . Anemia   . Complication of anesthesia   . End stage renal disease ([place])    hemodialysis 3x/wk @ [address]  . Family history of adverse reaction to anesthesia    sister vomiting after uterine cyst removal  . Gastritis   . GERD (gastroesophageal reflux disease)   . Hemorrhoids   . Hypertension   . PONV (postoperative nausea and vomiting)   . Renal disorder   . UTI (lower urinary tract infection) March 2014    Patient Active Problem List   Diagnosis Date Noted  . Heme + stool   . GERD (gastroesophageal reflux disease) 01/17/2016  . Esophageal dysphagia 01/17/2016  . Abdominal pain, epigastric 01/17/2016  . Guaiac positive stools 11/28/2015  . Colon cancer screening 11/13/2015  . Breast cancer screening 11/13/2015  . 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 Chest pressure 04/05/2013  . Pain in limb 04/05/2013  . End stage renal disease ([place]) 04/05/2013  . Abdominal pain, other specified site 04/05/2013  . Post-menopausal  bleeding 04/06/2012  . End stage renal disease on dialysis ([place]) 04/06/2012  . Hypertension 04/06/2012    Past Surgical History:  Procedure Laterality Date  . AV FISTULA PLACEMENT Left 07/05/09   Dr. Kellie Simmering  . COLONOSCOPY N/A 02/10/2016   SLF: 1. HEME postive stool due to colon polyps intrernal hemorrhoids 2. small internal hemrrohoids 3. moderate external hemorrhoids.   . ESOPHAGEAL DILATION N/A 02/10/2016   Procedure: ESOPHAGEAL DILATION;  Surgeon: Danie Binder, MD;  [facility];  Service: Endoscopy;  Laterality: N/A;  . ESOPHAGOGASTRODUODENOSCOPY N/A 02/10/2016   SLF: patent Schatzki's ring , mild gastritis/ duodentitis.   Marland Kitchen FISTULOGRAM N/A 10/22/2014   Procedure: FISTULOGRAM;  Surgeon: Angelia Mould, MD;  Location: [facility] CATH LAB;  Service: Cardiovascular;  Laterality: N/A;  . PERIPHERAL VASCULAR CATHETERIZATION Left 12/09/2016   Procedure: A/V Fistulagram;  Surgeon: Waynetta Sandy, MD;  Location: [facility] CV LAB;  Service: Cardiovascular;  Laterality: Left;  . REVISON OF ARTERIOVENOUS FISTULA Left 44/31/5400   Procedure: PLICATION OF BRACHIOCEPHALIC FISTULA;  Surgeon: Conrad Oak Grove, MD;  Location: [place];  Service: Vascular;  Laterality: Left;  . TUBAL LIGATION  2004    OB History    No data available       Home Medications    Prior to Admission medications   Medication Sig Start Date End Date Taking? Authorizing Provider  acetaminophen (TYLENOL) 325 MG tablet Take 650 mg by mouth every 6 (six) hours as needed  for mild pain.     [provider]  amitriptyline (ELAVIL) 50 MG tablet Take 1 tablet (50 mg total) by mouth at bedtime. 03/22/17   Maren Reamer, MD  amLODipine (NORVASC) 10 MG tablet Take 5 mg by mouth 2 (two) times daily.    [provider]  calcium carbonate (TUMS - DOSED IN MG ELEMENTAL CALCIUM) 500 MG chewable tablet Chew 2 tablets by mouth daily as needed for indigestion or heartburn.    [provider]    carvedilol (COREG) 6.25 MG tablet Take 3.125 mg by mouth 2 (two) times daily with a meal. Pt states takes 1/2 tab bid    [provider]  ethyl chloride spray APPLY TOPICALLY AS NEEDED Patient taking differently: Apply 1 application topically as needed (for dialysis).  09/21/16   Tresa Garter, MD  ferric citrate (AURYXIA) 1 GM 210 MG(Fe) tablet Take 420-630 mg by mouth 3 (three) times daily with meals.    [provider]  omeprazole (PRILOSEC) 20 MG capsule 1 PO 30 mins prior to breakfast and supper Patient taking differently: Take 20 mg by mouth 2 (two) times daily as needed (for acid reflux).  02/10/16   Fields, Marga Melnick, MD  Vitamin D, Ergocalciferol, (DRISDOL) 50000 units CAPS capsule Take 1 capsule (50,000 Units total) by mouth every 7 (seven) days. 03/23/17   Maren Reamer, MD    Family History Family History  Problem Relation Age of Onset  . Hypertension Mother   . Hypertension Father   . Hypertension Sister   . Diabetes Brother   . Hypertension Brother     Social History Social History  Substance Use Topics  . Smoking status: Never Smoker  . Smokeless tobacco: Never Used  . Alcohol use No     Allergies   Patient has no known allergies.   Review of Systems Review of Systems  Constitutional: Negative for chills and fever.  Respiratory: Negative.  Negative for shortness of breath.   Cardiovascular: Negative.  Negative for chest pain.  Gastrointestinal: Positive for abdominal pain and nausea. Negative for constipation, diarrhea and vomiting.  Genitourinary: Positive for enuresis (as dialysis patient).  Musculoskeletal: Negative.  Negative for back pain.  Skin: Negative.   Neurological: Negative.      Physical Exam Updated Vital Signs BP (!) 144/73   Pulse 65   Temp 99.1 F (37.3 C) (Oral)   Resp 16   Ht 5\' 3"  (1.6 m)   Wt 64.4 kg (142 lb)   SpO2 98%   BMI 25.15 kg/m   Physical Exam  Constitutional: She is oriented to person,  place, and time. She appears well-developed and well-nourished.  HENT:  Head: Normocephalic.  Neck: Normal range of motion. Neck supple.  Cardiovascular: Normal rate and regular rhythm.   Pulmonary/Chest: Effort normal and breath sounds normal.  Abdominal: Soft. Bowel sounds are normal. She exhibits no distension and no mass. There is tenderness (periumbilical tenderness to soft abdomen). There is no rebound and no guarding.  Musculoskeletal: Normal range of motion.  Neurological: She is alert and oriented to person, place, and time.  Skin: Skin is warm and dry. No rash noted.  Psychiatric: She has a normal mood and affect.     ED Treatments / Results  Labs (all labs ordered are listed, but only abnormal results are displayed) Labs Reviewed  COMPREHENSIVE METABOLIC PANEL - Abnormal; Notable for the following:       Result Value   Potassium 2.8 (*)  Chloride 93 (*)    Glucose, Bld 119 (*)    Creatinine, Ser 3.57 (*)    GFR calc non Af Amer 13 (*)    GFR calc Af Amer 16 (*)    All other components within normal limits  CBC - Abnormal; Notable for the following:    RBC 3.52 (*)    Hemoglobin 10.9 (*)    HCT 33.1 (*)    RDW 16.0 (*)    All other components within normal limits  LIPASE, BLOOD  URINALYSIS, ROUTINE W REFLEX MICROSCOPIC    EKG  EKG Interpretation None       Radiology No results found.  Procedures Procedures (including critical care time)  Medications Ordered in ED Medications  gi cocktail (Maalox,Lidocaine,Donnatal) (not administered)     Initial Impression / Assessment and Plan / ED Course  I have reviewed the triage vital signs and the nursing notes.  Pertinent labs & imaging results that were available during my care of the patient were reviewed by me and considered in my medical decision making (see chart for details).     Chart reviewed. The patient has had gastritis in the past, usually better with GI Cocktail. Last CT performed 2016.  Per note of admission 07/2016, she had a colonoscopy and EGD showing gastritis and polyps, per patient.   Here, she has normal VT, temp 99.1. She is well appearing. Will give GI cocktail and re-evaluate  Patient obtains some relief with GI cocktail, then some more relief with Bentyl. Imaging, labs reassuring. VSS. She is felt appropriate for discharge home with return precautions.   Final Clinical Impressions(s) / ED Diagnoses   Final diagnoses:  None   1. Abdominal pain, periumbilical  New Prescriptions New Prescriptions   No medications on file     Dennie Bible 07/25/17 4037    Tegeler, Gwenyth Allegra, MD 07/25/17 [phone]

## 2017-07-24 NOTE — ED Triage Notes (Signed)
Pt c/o 9/10 mid lower abd pain since last night getting worse today, no fever, nausea or vomiting, las BM today and normal pt is HD pt last treatment today.

## 2017-07-24 NOTE — ED Notes (Signed)
HD pt unable to give urine sample at this time states she just urinated pta to the ED.

## 2017-07-25 ENCOUNTER — Emergency Department (HOSPITAL_COMMUNITY): Payer: Self-pay

## 2017-07-25 LAB — URINALYSIS, ROUTINE W REFLEX MICROSCOPIC
BACTERIA UA: NONE SEEN
BILIRUBIN URINE: NEGATIVE
Glucose, UA: 50 mg/dL — AB
HGB URINE DIPSTICK: NEGATIVE
KETONES UR: NEGATIVE mg/dL
LEUKOCYTES UA: NEGATIVE
NITRITE: NEGATIVE
PH: 9 — AB (ref 5.0–8.0)
Protein, ur: >=300 mg/dL — AB
Specific Gravity, Urine: 1.007 (ref 1.005–1.030)

## 2017-07-25 MED ORDER — DICYCLOMINE HCL 10 MG PO CAPS
20.0000 mg | ORAL_CAPSULE | Freq: Once | ORAL | Status: AC
  Administered 2017-07-25: 20 mg via ORAL
  Filled 2017-07-25: qty 2

## 2017-07-25 MED ORDER — DICYCLOMINE HCL 10 MG PO CAPS: 10.0000 mg | ORAL_CAPSULE | Freq: Once | ORAL | Status: DC

## 2017-07-25 MED ORDER — DICYCLOMINE HCL 20 MG PO TABS: 20.0000 mg | ORAL_TABLET | Freq: Two times a day (BID) | ORAL | 0 refills | Status: DC

## 2017-07-25 NOTE — Discharge Instructions (Signed)
Increase your Prilosec to one pill twice daily and take Bentyl as prescribed. Follow up with your doctor for recheck in 2-3 days and return here if symptoms worsen.

## 2017-07-25 NOTE — ED Notes (Signed)
ED Provider at bedside. 

## 2017-07-29 ENCOUNTER — Encounter: Payer: Self-pay | Admitting: Vascular Surgery

## 2017-07-30 ENCOUNTER — Encounter: Payer: Self-pay | Admitting: Vascular Surgery

## 2017-07-30 ENCOUNTER — Encounter: Payer: Self-pay | Admitting: *Deleted

## 2017-07-30 ENCOUNTER — Other Ambulatory Visit: Payer: Self-pay | Admitting: *Deleted

## 2017-07-30 ENCOUNTER — Ambulatory Visit (INDEPENDENT_AMBULATORY_CARE_PROVIDER_SITE_OTHER): Payer: Self-pay | Admitting: Vascular Surgery

## 2017-07-30 VITALS — BP 145/80 | HR 66 | Temp 98.1°F | Resp 18 | Ht 63.0 in | Wt 138.0 lb

## 2017-07-30 DIAGNOSIS — N186 End stage renal disease: Secondary | ICD-10-CM

## 2017-07-30 DIAGNOSIS — Z992 Dependence on renal dialysis: Secondary | ICD-10-CM

## 2017-07-30 NOTE — Progress Notes (Signed)
Patient ID: Sabrina Mitchell, female   DOB: [birth year], 56 y.o.   MRN: [id number]  Reason for Consult: Re-evaluation (eval open wound over R UA AVF )   Referred by Maren Reamer, MD  Subjective:     HPI:  Sabrina Mitchell is a 56 y.o. female with history end-stage renal disease on dialysis via left arm AV fistula. She has had a pseudoaneurysm revision in the past and arm by Dr. Bridgett Larsson. I performed fistulogram last year which demonstrated a large side branch but otherwise patency of the fistula. She now presents for ulceration over the midportion of the fistula where they're sticking. She has not had any bleeding other than with dialysis. She does have pain at site of ulceration but it does appear that they are also cannulating there. Her left hand is fine she has no complaints there.  Past Medical History:  Diagnosis Date  . Anemia   . Complication of anesthesia   . End stage renal disease ([place])    hemodialysis 3x/wk @ [address]  . Family history of adverse reaction to anesthesia    sister vomiting after uterine cyst removal  . Gastritis   . GERD (gastroesophageal reflux disease)   . Hemorrhoids   . Hypertension   . PONV (postoperative nausea and vomiting)   . Renal disorder   . UTI (lower urinary tract infection) March 2014   Family History  Problem Relation Age of Onset  . Hypertension Mother   . Hypertension Father   . Hypertension Sister   . Diabetes Brother   . Hypertension Brother    Past Surgical History:  Procedure Laterality Date  . AV FISTULA PLACEMENT Left 07/05/09   Dr. Kellie Simmering  . COLONOSCOPY N/A 02/10/2016   SLF: 1. HEME postive stool due to colon polyps intrernal hemorrhoids 2. small internal hemrrohoids 3. moderate external hemorrhoids.   . ESOPHAGEAL DILATION N/A 02/10/2016   Procedure: ESOPHAGEAL DILATION;  Surgeon: Danie Binder, MD;  [facility];  Service: Endoscopy;  Laterality: N/A;  . ESOPHAGOGASTRODUODENOSCOPY N/A  02/10/2016   SLF: patent Schatzki's ring , mild gastritis/ duodentitis.   Marland Kitchen FISTULOGRAM N/A 10/22/2014   Procedure: FISTULOGRAM;  Surgeon: Angelia Mould, MD;  Location: [facility] CATH LAB;  Service: Cardiovascular;  Laterality: N/A;  . PERIPHERAL VASCULAR CATHETERIZATION Left 12/09/2016   Procedure: A/V Fistulagram;  Surgeon: Waynetta Sandy, MD;  Location: [facility] CV LAB;  Service: Cardiovascular;  Laterality: Left;  . REVISON OF ARTERIOVENOUS FISTULA Left 32/67/1245   Procedure: PLICATION OF BRACHIOCEPHALIC FISTULA;  Surgeon: Conrad Westphalia, MD;  Location: [place];  Service: Vascular;  Laterality: Left;  . TUBAL LIGATION  2004    Short Social History:  Social History  Substance Use Topics  . Smoking status: Never Smoker  . Smokeless tobacco: Never Used  . Alcohol use No    No Known Allergies  Current Outpatient Prescriptions  Medication Sig Dispense Refill  . acetaminophen (TYLENOL) 325 MG tablet Take 650 mg by mouth every 6 (six) hours as needed for mild pain.     Marland Kitchen amLODipine (NORVASC) 10 MG tablet Take 5 mg by mouth 2 (two) times daily.    . calcium carbonate (TUMS - DOSED IN MG ELEMENTAL CALCIUM) 500 MG chewable tablet Chew 2 tablets by mouth daily as needed for indigestion or heartburn.    . carvedilol (COREG) 6.25 MG tablet Take 3.125 mg by mouth 2 (two) times daily with a meal. Pt states takes 1/2  tab bid    . dicyclomine (BENTYL) 20 MG tablet Take 1 tablet (20 mg total) by mouth 2 (two) times daily. 20 tablet 0  . ethyl chloride spray APPLY TOPICALLY AS NEEDED (Patient taking differently: Apply 1 application topically as needed (for dialysis). ) 104 mL 0  . omeprazole (PRILOSEC) 20 MG capsule 1 PO 30 mins prior to breakfast and supper (Patient taking differently: Take 20 mg by mouth 2 (two) times daily as needed (for acid reflux). ) 60 capsule 11  . Vitamin D, Ergocalciferol, (DRISDOL) 50000 units CAPS capsule Take 1 capsule (50,000 Units total) by mouth every 7  (seven) days. 12 capsule 0  . amitriptyline (ELAVIL) 50 MG tablet Take 1 tablet (50 mg total) by mouth at bedtime. (Patient not taking: Reported on 07/30/2017) 30 tablet 3  . ferric citrate (AURYXIA) 1 GM 210 MG(Fe) tablet Take 420-630 mg by mouth 3 (three) times daily with meals.     No current facility-administered medications for this visit.     Review of Systems  Constitutional:  Constitutional negative. HENT: HENT negative.  Eyes: Eyes negative.  Respiratory: Positive for shortness of breath.  Cardiovascular: Positive for chest pain.  GI: Gastrointestinal negative.  Musculoskeletal: Positive for leg pain.  Skin: Positive for wound.  Neurological: Neurological negative. Hematologic: Hematologic/lymphatic negative.  Psychiatric: Psychiatric negative.        Objective:  Objective   Vitals:   07/30/17 0850  BP: (!) 145/80  Pulse: 66  Resp: 18  Temp: 98.1 F (36.7 C)  TempSrc: Oral  SpO2: 100%  Weight: 138 lb (62.6 kg)  Height: 5\' 3"  (1.6 m)   Body mass index is 24.45 kg/m.  Physical Exam  Constitutional: She is oriented to person, place, and time. She appears well-developed.  HENT:  Head: Normocephalic.  Eyes: Pupils are equal, round, and reactive to light.  Neck: Normal range of motion.  Cardiovascular: Normal rate.   Pulses:      Radial pulses are 2+ on the right side, and 2+ on the left side.  Pulmonary/Chest: Effort normal.  Abdominal: Soft.  Musculoskeletal: Normal range of motion.  Left upper arm avf with likely superficial ulceration Palpable thrill with pulsatility in vein  Neurological: She is alert and oriented to person, place, and time.  Skin: Skin is warm and dry.  Psychiatric: She has a normal mood and affect. Her behavior is normal. Judgment and thought content normal.          Assessment/Plan:     56 year old female on dialysis via left upper arm AV fistula on Tuesdays Thursdays and Saturday. She has what appears to be superficial  ulceration likely at the site of previous pseudoaneurysmal resection. From the standpoint she needs at least a skin revision which may require revision of the underlying fistula as well. Reviewing her previous fistulogram she also has a very large side branch which is palpable 2 and we should ligate this since she will be in the operating room if it is readily identifiable. I discussed with her the risks being that the fistula has issues requiring tunneled catheter placement. She demonstrates good understanding and we will get her scheduled for a left arm fistula revision with possible side branch ligation and possible catheter on a nondialysis day in the near future.     Waynetta Sandy MD [facility]

## 2017-08-06 ENCOUNTER — Encounter: Payer: Self-pay | Admitting: Internal Medicine

## 2017-08-06 ENCOUNTER — Encounter (HOSPITAL_COMMUNITY): Payer: Self-pay | Admitting: *Deleted

## 2017-08-06 ENCOUNTER — Ambulatory Visit: Payer: Self-pay | Attending: Internal Medicine | Admitting: Internal Medicine

## 2017-08-06 VITALS — BP 130/81 | HR 69 | Temp 98.2°F | Resp 18 | Ht 59.0 in | Wt 139.0 lb

## 2017-08-06 DIAGNOSIS — G4700 Insomnia, unspecified: Secondary | ICD-10-CM | POA: Insufficient documentation

## 2017-08-06 DIAGNOSIS — Z79899 Other long term (current) drug therapy: Secondary | ICD-10-CM | POA: Insufficient documentation

## 2017-08-06 DIAGNOSIS — I12 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 disease: Secondary | ICD-10-CM | POA: Insufficient documentation

## 2017-08-06 DIAGNOSIS — K219 Gastro-esophageal reflux disease without esophagitis: Secondary | ICD-10-CM | POA: Insufficient documentation

## 2017-08-06 DIAGNOSIS — N186 End stage renal disease: Secondary | ICD-10-CM | POA: Insufficient documentation

## 2017-08-06 DIAGNOSIS — Z833 Family history of diabetes mellitus: Secondary | ICD-10-CM | POA: Insufficient documentation

## 2017-08-06 DIAGNOSIS — K148 Other diseases of tongue: Secondary | ICD-10-CM

## 2017-08-06 DIAGNOSIS — Z9889 Other specified postprocedural states: Secondary | ICD-10-CM | POA: Insufficient documentation

## 2017-08-06 DIAGNOSIS — Z8249 Family history of ischemic heart disease and other diseases of the circulatory system: Secondary | ICD-10-CM | POA: Insufficient documentation

## 2017-08-06 DIAGNOSIS — E559 Vitamin D deficiency, unspecified: Secondary | ICD-10-CM | POA: Insufficient documentation

## 2017-08-06 DIAGNOSIS — Z992 Dependence on renal dialysis: Secondary | ICD-10-CM | POA: Insufficient documentation

## 2017-08-06 DIAGNOSIS — K228 Other specified diseases of esophagus: Secondary | ICD-10-CM | POA: Insufficient documentation

## 2017-08-06 DIAGNOSIS — R1033 Periumbilical pain: Secondary | ICD-10-CM

## 2017-08-06 DIAGNOSIS — R195 Other fecal abnormalities: Secondary | ICD-10-CM | POA: Insufficient documentation

## 2017-08-06 DIAGNOSIS — K2960 Other gastritis without bleeding: Secondary | ICD-10-CM | POA: Insufficient documentation

## 2017-08-06 DIAGNOSIS — Z1239 Encounter for other screening for malignant neoplasm of breast: Secondary | ICD-10-CM

## 2017-08-06 DIAGNOSIS — K149 Disease of tongue, unspecified: Secondary | ICD-10-CM | POA: Insufficient documentation

## 2017-08-06 DIAGNOSIS — Z1231 Encounter for screening mammogram for malignant neoplasm of breast: Secondary | ICD-10-CM

## 2017-08-06 MED ORDER — OMEPRAZOLE 20 MG PO CPDR: 20.0000 mg | DELAYED_RELEASE_CAPSULE | Freq: Every day | ORAL | 3 refills | Status: DC

## 2017-08-06 NOTE — Progress Notes (Signed)
Patient is here for stomach pain   Patient stated that she has a rash on her tongue

## 2017-08-06 NOTE — Progress Notes (Signed)
Patient ID: Sabrina Mitchell, female    DOB: 01/20/1961  MRN: [id number]  CC: No chief complaint on file.   Subjective: Sabrina Mitchell is a 56 y.o. female who presents for re-est care. Last seen 02/2017. Janine Ores, is with her. Her concerns today include:  56 year old with end-stage renal disease on HD, hypertension, ACD, GERD, vit D def and insomnia.  1. Seen in ER 7/28 with intermittent periumbilical pain x 3-4 days. Abd x-ray neg. ?gastritis.  Given GI cocktail and Bentyl. No further pain since then -had EGD and colonoscopy 01/2016. EGD revealed mild erosive gastritis and mild inflammation of the duodenal bulb. H. pylori was negative on biopsy.  She had several polyps removed one of which was a tubular adenoma -No episode since seen in ER -no N/V/diarrhea. No pain with meals "unless I eat something that does not agree with me." -takes Omeprazole intermittently.   2.  I have hives on my tongue x 5 days. Red and hurts.  HM: due for PAP.   Patient Active Problem List   Diagnosis Date Noted  . Heme + stool   . GERD (gastroesophageal reflux disease) 01/17/2016  . Esophageal dysphagia 01/17/2016  . Abdominal pain, epigastric 01/17/2016  . Guaiac positive stools 11/28/2015  . Colon cancer screening 11/13/2015  . Breast cancer screening 11/13/2015  . 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 Chest pressure 04/05/2013  . Pain in limb 04/05/2013  . End stage renal disease ([place]) 04/05/2013  . Abdominal pain, other specified site 04/05/2013  . Post-menopausal bleeding 04/06/2012  . End stage renal disease on dialysis ([place]) 04/06/2012  . Hypertension 04/06/2012     Current Outpatient Prescriptions on File Prior to Visit  Medication Sig Dispense Refill  . acetaminophen (TYLENOL) 325 MG tablet Take 650 mg by mouth every 6 (six) hours as needed for mild pain.     Marland Kitchen amitriptyline (ELAVIL) 50 MG tablet Take 1 tablet (50 mg total) by mouth at  bedtime. (Patient not taking: Reported on 07/30/2017) 30 tablet 3  . amLODipine (NORVASC) 10 MG tablet Take 10 mg by mouth at bedtime.     . calcium acetate (PHOSLO) 667 MG capsule Take 667 mg by mouth 3 (three) times daily with meals.    . calcium carbonate (TUMS - DOSED IN MG ELEMENTAL CALCIUM) 500 MG chewable tablet Chew 2 tablets by mouth daily as needed for indigestion or heartburn.    . carvedilol (COREG) 6.25 MG tablet Take 6.25 mg by mouth at bedtime. Pt states takes 1/2 tab bid    . dicyclomine (BENTYL) 20 MG tablet Take 1 tablet (20 mg total) by mouth 2 (two) times daily. (Patient not taking: Reported on 08/05/2017) 20 tablet 0  . ethyl chloride spray APPLY TOPICALLY AS NEEDED (Patient taking differently: Apply 1 application topically as needed (for dialysis). ) 104 mL 0  . Vitamin D, Ergocalciferol, (DRISDOL) 50000 units CAPS capsule Take 1 capsule (50,000 Units total) by mouth every 7 (seven) days. (Patient taking differently: Take 50,000 Units by mouth every Tuesday. ) 12 capsule 0   No current facility-administered medications on file prior to visit.     No Known Allergies  Social History   Social History  . Marital status: Significant Other    Spouse name: N/A  . Number of children: 2  . Years of education: N/A   Occupational History  . Not on file.   Social History Main Topics  . Smoking status: Never Smoker  .  Smokeless tobacco: Never Used  . Alcohol use No  . Drug use: No  . Sexual activity: Not Currently    Birth control/ protection: Surgical   Other Topics Concern  . Not on file   Social History Narrative  . No narrative on file    Family History  Problem Relation Age of Onset  . Hypertension Mother   . Hypertension Father   . Hypertension Sister   . Diabetes Brother   . Hypertension Brother     Past Surgical History:  Procedure Laterality Date  . AV FISTULA PLACEMENT Left 07/05/09   Dr. Kellie Simmering  . COLONOSCOPY N/A 02/10/2016   SLF: 1. HEME postive  stool due to colon polyps intrernal hemorrhoids 2. small internal hemrrohoids 3. moderate external hemorrhoids.   . ESOPHAGEAL DILATION N/A 02/10/2016   Procedure: ESOPHAGEAL DILATION;  Surgeon: Danie Binder, MD;  [facility];  Service: Endoscopy;  Laterality: N/A;  . ESOPHAGOGASTRODUODENOSCOPY N/A 02/10/2016   SLF: patent Schatzki's ring , mild gastritis/ duodentitis.   Marland Kitchen FISTULOGRAM N/A 10/22/2014   Procedure: FISTULOGRAM;  Surgeon: Angelia Mould, MD;  Location: [facility] CATH LAB;  Service: Cardiovascular;  Laterality: N/A;  . PERIPHERAL VASCULAR CATHETERIZATION Left 12/09/2016   Procedure: A/V Fistulagram;  Surgeon: Waynetta Sandy, MD;  Location: [place] CV LAB;  Service: Cardiovascular;  Laterality: Left;  . REVISON OF ARTERIOVENOUS FISTULA Left 11/94/1740   Procedure: PLICATION OF BRACHIOCEPHALIC FISTULA;  Surgeon: Conrad Hixton, MD;  Location: [place];  Service: Vascular;  Laterality: Left;  . TUBAL LIGATION  2004    ROS: Review of Systems Negative except as stated above  PHYSICAL EXAM: BP 130/81 (BP Location: Left Arm, Patient Position: Sitting, Cuff Size: Normal)   Pulse 69   Temp 98.2 F (36.8 C) (Oral)   Resp 18   Ht 4\' 11"  (1.499 m)   Wt 139 lb (63 kg)   SpO2 98%   BMI 28.07 kg/m   Wt Readings from Last 3 Encounters:  08/06/17 139 lb (63 kg)  07/30/17 138 lb (62.6 kg)  07/24/17 142 lb (64.4 kg)    Physical Exam General appearance - alert, well appearing, and in no distress Mental status - alert, oriented to person, place, and time, normal mood, behavior, speech, dress, motor activity, and thought processes Mouth - small polyp like tender  Lesions on tongue. One on LT side about midway on tongue. Other RT frontolateral.  Chest - clear to auscultation, no wheezes, rales or rhonchi, symmetric air entry Heart - normal rate, regular rhythm, normal S1, S2, no murmurs, rubs, clicks or gallops Abdomen - nondistended. Hyperactive bowel sounds.  Nontender.   Depression screen [facility] 2/9 08/06/2017 03/22/2017 11/13/2015  Decreased Interest 1 2 0  Down, Depressed, Hopeless 1 2 0  PHQ - 2 Score 2 4 0  Altered sleeping 3 1 -  Tired, decreased energy 0 1 -  Change in appetite 0 3 -  Feeling bad or failure about yourself  0 - -  Trouble concentrating 0 0 -  Moving slowly or fidgety/restless 0 0 -  Suicidal thoughts 0 0 -  PHQ-9 Score 5 9 -    ASSESSMENT AND PLAN: 1. Periumbilical abdominal pain -Patient has not had any further episodes. Told to follow-up if she does. She will start taking omeprazole daily. GERD precautions given - omeprazole (PRILOSEC) 20 MG capsule; Take 1 capsule (20 mg total) by mouth daily. 1 PO 30 mins prior to breakfast and supper  Dispense:  30 capsule; Refill: 3  2. Tongue lesion ? Etiology. May need bx - Ambulatory referral to Dentistry  3. Breast cancer screening - [facility] Digital Screening; Future  Patient was given the opportunity to ask questions.  Patient verbalized understanding of the plan and was able to repeat key elements of the plan.   Orders Placed This Encounter  Procedures  . MM Digital Screening  . Ambulatory referral to Dentistry     Requested Prescriptions   Signed Prescriptions Disp Refills  . omeprazole (PRILOSEC) 20 MG capsule 30 capsule 3    Sig: Take 1 capsule (20 mg total) by mouth daily. 1 PO 30 mins prior to breakfast and supper    Return in about 1 month (around 09/06/2017) for pap.  Karle Plumber, MD, FACP

## 2017-08-06 NOTE — Progress Notes (Signed)
Anesthesia Chart Review:  Pt is a same day work up.  Patient is a 56 year old female scheduled for revision of left AV fistula ligation of side branch, insertion of dialysis catheter on 08/09/2017 with Servando Snare, M.D.  - PCP is Karle Plumber, MD at the [facility].   PMH includes: HTN, ESRD on hemodialysis, anemia, post-op N/V, GERD. Never smoker. BMI 28.  - ED visit 07/24/17 for abdominal pain  Medications include: Amlodipine, carvedilol, Prilosec  Labs will be obtained DOS.  Acute abdomen with chest XR 07/25/17:  - Negative for bowel obstruction or perforation. No acute cardiopulmonary findings.  EKG 12/09/16: NSR.  Pt speaks Spanish.  PAT RN spoke with pt's daughter by telephone for pre-op call.  Daughter reports that patient sometimes complains of chest pain "on and off", pt has informed her Dr and they did an EKG and said it was ok. "Chest pain usually happens at night on the evening prior to Hemodialysis, denies shortness of breath, lightheadedness or nausea or vomiting."  Daughter reported, pt "has some anxiety and depression  still about dialysis (has been on it for approximately 7 years)" and  "Chest pain goes away when she goes to sleep." - Pt saw PCP today 08/06/17 and did not mention chest pain.   Pt will need further assessment DOS by assigned anesthesiologist.   Willeen Cass, FNP-BC [facility]/Anesthesiology Phone: [phone] 08/06/2017 4:00 PM

## 2017-08-06 NOTE — Progress Notes (Signed)
I spoke with Purcell Nails, Sabrina Mitchell's daughter.  Vernie Shanks reports that patient sometimes complains of chest pain "on and off", she reports that she has informed her Dr and they did an [facility] said it was ok. "Chest pain usually happens at night on the evening prior to Hemodialysis, denies shortness of breath, lightheadedness or nausea or vomiting."  Abigail reported, "she has some anxiety and depression  still about dialysis (has been on it for approximately 7 years), "  "Chest pain goes away when she goes to sleep."

## 2017-08-08 MED ORDER — DEXTROSE 5 % IV SOLN
1.5000 g | INTRAVENOUS | Status: AC
  Administered 2017-08-09: 1.5 g via INTRAVENOUS
  Filled 2017-08-08: qty 1.5

## 2017-08-09 ENCOUNTER — Encounter (HOSPITAL_COMMUNITY)
Admission: RE | Disposition: A | Discharge status: Home / Self Care | Payer: Self-pay | Source: Ambulatory Visit | Attending: Vascular Surgery

## 2017-08-09 ENCOUNTER — Ambulatory Visit (HOSPITAL_COMMUNITY): Payer: Self-pay | Admitting: Emergency Medicine

## 2017-08-09 ENCOUNTER — Encounter (HOSPITAL_COMMUNITY): Payer: Self-pay | Admitting: Anesthesiology

## 2017-08-09 ENCOUNTER — Ambulatory Visit (HOSPITAL_COMMUNITY)
Admission: RE | Admit: 2017-08-09 | Discharge: 2017-08-09 | Disposition: A | Discharge status: Home / Self Care | Payer: Self-pay | Source: Ambulatory Visit | Attending: Vascular Surgery | Admitting: Vascular Surgery

## 2017-08-09 DIAGNOSIS — Z8249 Family history of ischemic heart disease and other diseases of the circulatory system: Secondary | ICD-10-CM | POA: Insufficient documentation

## 2017-08-09 DIAGNOSIS — T82898A Other specified complication of vascular prosthetic devices, implants and grafts, initial encounter: Secondary | ICD-10-CM

## 2017-08-09 DIAGNOSIS — Z992 Dependence on renal dialysis: Secondary | ICD-10-CM

## 2017-08-09 DIAGNOSIS — Z87440 Personal history of urinary (tract) infections: Secondary | ICD-10-CM | POA: Insufficient documentation

## 2017-08-09 DIAGNOSIS — N186 End stage renal disease: Secondary | ICD-10-CM

## 2017-08-09 DIAGNOSIS — I12 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 disease: Secondary | ICD-10-CM | POA: Insufficient documentation

## 2017-08-09 DIAGNOSIS — Z79899 Other long term (current) drug therapy: Secondary | ICD-10-CM | POA: Insufficient documentation

## 2017-08-09 DIAGNOSIS — D631 Anemia in chronic kidney disease: Secondary | ICD-10-CM | POA: Insufficient documentation

## 2017-08-09 DIAGNOSIS — Z833 Family history of diabetes mellitus: Secondary | ICD-10-CM | POA: Insufficient documentation

## 2017-08-09 DIAGNOSIS — K219 Gastro-esophageal reflux disease without esophagitis: Secondary | ICD-10-CM | POA: Insufficient documentation

## 2017-08-09 DIAGNOSIS — Z9889 Other specified postprocedural states: Secondary | ICD-10-CM | POA: Insufficient documentation

## 2017-08-09 HISTORY — DX: Pneumonia, unspecified organism: J18.9

## 2017-08-09 HISTORY — DX: Depression, unspecified: F32.A

## 2017-08-09 HISTORY — DX: Anxiety disorder, unspecified: F41.9

## 2017-08-09 HISTORY — DX: Major depressive disorder, single episode, unspecified: F32.9

## 2017-08-09 HISTORY — PX: REVISON OF ARTERIOVENOUS FISTULA: SHX6074

## 2017-08-09 LAB — HCG, SERUM, QUALITATIVE: PREG SERUM: NEGATIVE

## 2017-08-09 LAB — POCT I-STAT 4, (NA,K, GLUC, HGB,HCT)
Glucose, Bld: 82 mg/dL (ref 65–99)
HEMATOCRIT: 31 % — AB (ref 36.0–46.0)
HEMOGLOBIN: 10.5 g/dL — AB (ref 12.0–15.0)
Potassium: 3.6 mmol/L (ref 3.5–5.1)
Sodium: 138 mmol/L (ref 135–145)

## 2017-08-09 SURGERY — REVISON OF ARTERIOVENOUS FISTULA
Anesthesia: Monitor Anesthesia Care | Laterality: Left

## 2017-08-09 MED ORDER — PROPOFOL 10 MG/ML IV BOLUS
INTRAVENOUS | Status: AC
  Filled 2017-08-09: qty 40

## 2017-08-09 MED ORDER — PROMETHAZINE HCL 25 MG/ML IJ SOLN
INTRAMUSCULAR | Status: AC
  Filled 2017-08-09: qty 1

## 2017-08-09 MED ORDER — SODIUM CHLORIDE 0.9 % IV SOLN: INTRAVENOUS | Status: DC

## 2017-08-09 MED ORDER — PROPOFOL 500 MG/50ML IV EMUL
INTRAVENOUS | Status: DC | PRN
Start: 2017-08-09 — End: 2017-08-09
  Administered 2017-08-09: 25 ug/kg/min via INTRAVENOUS

## 2017-08-09 MED ORDER — FENTANYL CITRATE (PF) 250 MCG/5ML IJ SOLN
INTRAMUSCULAR | Status: AC
  Filled 2017-08-09: qty 5

## 2017-08-09 MED ORDER — SODIUM CHLORIDE 0.9 % IV SOLN
INTRAVENOUS | Status: DC
  Administered 2017-08-09: 10 mL/h via INTRAVENOUS
  Administered 2017-08-09: 10:00:00 via INTRAVENOUS

## 2017-08-09 MED ORDER — LIDOCAINE-EPINEPHRINE (PF) 1 %-1:200000 IJ SOLN
INTRAMUSCULAR | Status: AC
  Filled 2017-08-09: qty 30

## 2017-08-09 MED ORDER — HEPARIN SODIUM (PORCINE) 1000 UNIT/ML IJ SOLN
INTRAMUSCULAR | Status: AC
  Filled 2017-08-09: qty 1

## 2017-08-09 MED ORDER — LIDOCAINE-EPINEPHRINE (PF) 1 %-1:200000 IJ SOLN
INTRAMUSCULAR | Status: DC | PRN
  Administered 2017-08-09: 30 mL

## 2017-08-09 MED ORDER — LIDOCAINE HCL (PF) 1 % IJ SOLN
INTRAMUSCULAR | Status: AC
  Filled 2017-08-09: qty 30

## 2017-08-09 MED ORDER — PROMETHAZINE HCL 25 MG/ML IJ SOLN
6.2500 mg | Freq: Once | INTRAMUSCULAR | Status: AC
  Administered 2017-08-09: 6.25 mg via INTRAVENOUS

## 2017-08-09 MED ORDER — HYDROMORPHONE HCL 1 MG/ML IJ SOLN
0.2500 mg | INTRAMUSCULAR | Status: DC | PRN
  Administered 2017-08-09 (×2): 0.5 mg via INTRAVENOUS

## 2017-08-09 MED ORDER — CHLORHEXIDINE GLUCONATE 4 % EX LIQD: 60.0000 mL | Freq: Once | CUTANEOUS | Status: DC

## 2017-08-09 MED ORDER — HYDROMORPHONE HCL 1 MG/ML IJ SOLN
INTRAMUSCULAR | Status: DC
Start: 2017-08-09 — End: 2017-08-09
  Filled 2017-08-09: qty 1

## 2017-08-09 MED ORDER — 0.9 % SODIUM CHLORIDE (POUR BTL) OPTIME
TOPICAL | Status: DC | PRN
  Administered 2017-08-09: 1000 mL

## 2017-08-09 MED ORDER — SODIUM CHLORIDE 0.9 % IV SOLN
INTRAVENOUS | Status: DC | PRN
  Administered 2017-08-09: 11:00:00

## 2017-08-09 MED ORDER — LIDOCAINE HCL (CARDIAC) 20 MG/ML IV SOLN
INTRAVENOUS | Status: DC | PRN
  Administered 2017-08-09: 30 mg via INTRATRACHEAL

## 2017-08-09 MED ORDER — FENTANYL CITRATE (PF) 100 MCG/2ML IJ SOLN
INTRAMUSCULAR | Status: DC | PRN
  Administered 2017-08-09: 100 ug via INTRAVENOUS

## 2017-08-09 MED ORDER — OXYCODONE-ACETAMINOPHEN 5-325 MG PO TABS: 1.0000 | ORAL_TABLET | Freq: Four times a day (QID) | ORAL | 0 refills | Status: DC | PRN

## 2017-08-09 MED ORDER — MIDAZOLAM HCL 2 MG/2ML IJ SOLN
INTRAMUSCULAR | Status: AC
  Filled 2017-08-09: qty 2

## 2017-08-09 SURGICAL SUPPLY — 49 items
ADH SKN CLS APL DERMABOND .7 (GAUZE/BANDAGES/DRESSINGS) ×2
ARMBAND PINK RESTRICT EXTREMIT (MISCELLANEOUS) ×3 IMPLANT
BAG DECANTER FOR FLEXI CONT (MISCELLANEOUS) ×3 IMPLANT
BIOPATCH RED 1 DISK 7.0 (GAUZE/BANDAGES/DRESSINGS) ×3 IMPLANT
CANISTER SUCT 3000ML PPV (MISCELLANEOUS) ×3 IMPLANT
CATH PALINDROME RT-P 15FX19CM (CATHETERS) IMPLANT
CATH PALINDROME RT-P 15FX23CM (CATHETERS) IMPLANT
CATH PALINDROME RT-P 15FX28CM (CATHETERS) IMPLANT
CATH PALINDROME RT-P 15FX55CM (CATHETERS) IMPLANT
CLIP VESOCCLUDE MED 6/CT (CLIP) ×3 IMPLANT
CLIP VESOCCLUDE SM WIDE 6/CT (CLIP) ×3 IMPLANT
COVER PROBE W GEL 5X96 (DRAPES) ×3 IMPLANT
COVER SURGICAL LIGHT HANDLE (MISCELLANEOUS) ×3 IMPLANT
DERMABOND ADVANCED (GAUZE/BANDAGES/DRESSINGS) ×1
DERMABOND ADVANCED .7 DNX12 (GAUZE/BANDAGES/DRESSINGS) ×2 IMPLANT
DRAPE C-ARM 42X72 X-RAY (DRAPES) ×3 IMPLANT
DRAPE CHEST BREAST 15X10 FENES (DRAPES) ×3 IMPLANT
ELECT REM PT RETURN 9FT ADLT (ELECTROSURGICAL) ×3
ELECTRODE REM PT RTRN 9FT ADLT (ELECTROSURGICAL) ×2 IMPLANT
GAUZE SPONGE 4X4 16PLY XRAY LF (GAUZE/BANDAGES/DRESSINGS) ×3 IMPLANT
GLOVE BIO SURGEON STRL SZ7.5 (GLOVE) ×3 IMPLANT
GOWN STRL REUS W/ TWL LRG LVL3 (GOWN DISPOSABLE) ×4 IMPLANT
GOWN STRL REUS W/ TWL XL LVL3 (GOWN DISPOSABLE) ×2 IMPLANT
GOWN STRL REUS W/TWL LRG LVL3 (GOWN DISPOSABLE) ×6
GOWN STRL REUS W/TWL XL LVL3 (GOWN DISPOSABLE) ×3
KIT BASIN OR (CUSTOM PROCEDURE TRAY) ×3 IMPLANT
KIT ROOM TURNOVER OR (KITS) ×3 IMPLANT
NDL 18GX1X1/2 (RX/OR ONLY) (NEEDLE) ×1 IMPLANT
NDL HYPO 25GX1X1/2 BEV (NEEDLE) ×1 IMPLANT
NEEDLE 18GX1X1/2 (RX/OR ONLY) (NEEDLE) ×3 IMPLANT
NEEDLE HYPO 25GX1X1/2 BEV (NEEDLE) ×3 IMPLANT
NS IRRIG 1000ML POUR BTL (IV SOLUTION) ×3 IMPLANT
PACK CV ACCESS (CUSTOM PROCEDURE TRAY) ×3 IMPLANT
PACK SURGICAL SETUP 50X90 (CUSTOM PROCEDURE TRAY) ×3 IMPLANT
PAD ARMBOARD 7.5X6 YLW CONV (MISCELLANEOUS) ×6 IMPLANT
SOAP 2 % CHG 4 OZ (WOUND CARE) ×3 IMPLANT
SUT ETHILON 3 0 PS 1 (SUTURE) ×3 IMPLANT
SUT MNCRL AB 4-0 PS2 18 (SUTURE) ×3 IMPLANT
SUT PROLENE 6 0 BV (SUTURE) ×3 IMPLANT
SUT VIC AB 3-0 SH 27 (SUTURE) ×6
SUT VIC AB 3-0 SH 27X BRD (SUTURE) ×3 IMPLANT
SYR 10ML LL (SYRINGE) ×3 IMPLANT
SYR 20CC LL (SYRINGE) ×6 IMPLANT
SYR 5ML LL (SYRINGE) ×3 IMPLANT
SYR CONTROL 10ML LL (SYRINGE) ×3 IMPLANT
TOWEL GREEN STERILE FF (TOWEL DISPOSABLE) ×3 IMPLANT
TOWEL OR 17X26 4PK STRL BLUE (TOWEL DISPOSABLE) ×3 IMPLANT
UNDERPAD 30X30 (UNDERPADS AND DIAPERS) ×3 IMPLANT
WATER STERILE IRR 1000ML POUR (IV SOLUTION) ×3 IMPLANT

## 2017-08-09 NOTE — H&P (Signed)
   History and Physical Update  The patient was interviewed and re-examined.  The patient's previous History and Physical has been reviewed and is unchanged from recent office visit. Plan for revision of left arm avf.   Myrtice Lowdermilk C. Donzetta Matters, MD [facility] Office: [phone] Pager: [phone]   08/09/2017, 9:42 AM

## 2017-08-09 NOTE — Transfer of Care (Signed)
Immediate Anesthesia Transfer of Care Note  Patient: Sabrina Mitchell  Procedure(s) Performed: Procedure(s): REVISION OF ARTERIOVENOUS FISTULA LIGATION OF SIDE BRANCH (Left)  Patient Location: PACU  Anesthesia Type:MAC  Level of Consciousness: awake, alert  and oriented  Airway & Oxygen Therapy: Patient Spontanous Breathing  Post-op Assessment: Report given to RN  Post vital signs: stable  Last Vitals:  Vitals:   08/09/17 0754  BP: (!) 156/88  Pulse: 75  Resp: 18  Temp: 37.1 C  SpO2: 100%    Last Pain:  Vitals:   08/09/17 0823  TempSrc:   PainSc: 0-No pain      Patients Stated Pain Goal: 3 (35/00/93 8182)  Complications: No apparent anesthesia complications

## 2017-08-09 NOTE — Anesthesia Preprocedure Evaluation (Addendum)
Anesthesia Evaluation  Patient identified by MRN, date of birth, ID band Patient awake    Reviewed: Allergy & Precautions, H&P , NPO status , Patient's Chart, lab work & pertinent test results, reviewed documented beta blocker date and time   History of Anesthesia Complications (+) PONV  Airway Mallampati: II  TM Distance: >3 FB Neck ROM: Full    Dental no notable dental hx. (+) Upper Dentures, Lower Dentures, Dental Advisory Given   Pulmonary neg pulmonary ROS,    Pulmonary exam normal breath sounds clear to auscultation       Cardiovascular hypertension, Pt. on medications and Pt. on home beta blockers  Rhythm:Regular Rate:Normal     Neuro/Psych Anxiety Depression negative neurological ROS     GI/Hepatic Neg liver ROS, GERD  Medicated and Controlled,  Endo/Other  negative endocrine ROS  Renal/GU ESRF and DialysisRenal disease  negative genitourinary   Musculoskeletal   Abdominal   Peds  Hematology negative hematology ROS (+) anemia ,   Anesthesia Other Findings   Reproductive/Obstetrics negative OB ROS                            Anesthesia Physical Anesthesia Plan  ASA: III  Anesthesia Plan: MAC   Post-op Pain Management:    Induction: Intravenous  PONV Risk Score and Plan: 4 or greater and Ondansetron, Dexamethasone, Midazolam and Propofol infusion  Airway Management Planned: Simple Face Mask  Additional Equipment:   Intra-op Plan:   Post-operative Plan:   Informed Consent: I have reviewed the patients History and Physical, chart, labs and discussed the procedure including the risks, benefits and alternatives for the proposed anesthesia with the patient or authorized representative who has indicated his/her understanding and acceptance.   Dental advisory given  Plan Discussed with: CRNA  Anesthesia Plan Comments:         Anesthesia Quick Evaluation

## 2017-08-09 NOTE — Anesthesia Postprocedure Evaluation (Signed)
Anesthesia Post Note  Patient: Sabrina Mitchell  Procedure(s) Performed: Procedure(s) (LRB): REVISION OF ARTERIOVENOUS FISTULA LIGATION OF SIDE BRANCH (Left)     Patient location during evaluation: PACU Anesthesia Type: MAC Level of consciousness: awake and alert Pain management: pain level controlled Vital Signs Assessment: post-procedure vital signs reviewed and stable Respiratory status: spontaneous breathing, nonlabored ventilation and respiratory function stable Cardiovascular status: stable and blood pressure returned to baseline Anesthetic complications: no    Last Vitals:  Vitals:   08/09/17 1224 08/09/17 1230  BP: (!) 154/79   Pulse: 63 (!) 58  Resp: 12 11  Temp:    SpO2: 97% 96%    Last Pain:  Vitals:   08/09/17 1124  TempSrc:   PainSc: 10-Worst pain ever                 Niesha Bame,W. EDMOND

## 2017-08-09 NOTE — Op Note (Signed)
    Patient name: Sabrina Mitchell MRN: [id number] DOB: 1961-09-22 Sex: female  08/09/2017 Pre-operative Diagnosis: esrd Post-operative diagnosis:  Same Surgeon:  Erlene Quan C. Donzetta Matters, MD Assistant: OR nurse Procedure Performed: Revision of left arm av fistula ligation of side branch  Indications:  56 year old female with history of end-stage renal disease on dialysis via left arm fistula. She has pain and ulceration in the midportion of the cephalic vein fistula. She also has a side branch that is known from previous fistulogram.  Findings: Ulceration did not involve the fistula itself and was excised and primarily repaired. Side branch was large proximal 5 mm and was primarily ligated and divided.   Procedure:  The patient was identified in the holding area and taken to the operating room where she is placed supine on the operating table in Mac anesthesia induced. She was sterilely prepped and draped in the left arm usual fashion timeout called. Antibiotic for Mr. prior to incision. 1% lidocaine with epinephrine was instilled overlying the ulceration as well as the side branch which was identified with ultrasound. An elliptical incision was then made overlying the ulceration and this was excised with a 15 blade. The patient itself was not involved. Skin flaps on either side were raised and hemostasis obtained and this was closed in layers with 3-0 Vicryl for Monocryl. The fistula side branch that had been identified with was then exposed via transverse incision and ligated between silk ties. Hemostasis obtained this we did similarly was closed with 3-0 Vicryl for Monocryl. Dermabond was placed level skin. Patient is laterally from anesthesia having tolerated the procedure well without immediate competition. All counts were correct at completion.  Brandon C. Donzetta Matters, MD [facility] [place] [facility] Office: [phone] Pager: [phone]

## 2017-08-10 ENCOUNTER — Encounter (HOSPITAL_COMMUNITY): Payer: Self-pay | Admitting: Vascular Surgery

## 2017-08-17 ENCOUNTER — Telehealth: Payer: Self-pay | Admitting: Vascular Surgery

## 2017-08-17 NOTE — Telephone Encounter (Signed)
Used interpreter line to leave message, unsure family member understood exactly who was calling pt was not home at the time. Mailed letter to home address will try call again.  9/12 OV

## 2017-08-17 NOTE — Telephone Encounter (Signed)
-----   Message from Mena Goes, RN sent at 08/09/2017 12:14 PM EDT ----- Regarding: 4-6 weeks appt w/ NP   ----- Message ----- From: Waynetta Sandy, MD Sent: 08/09/2017  11:28 AM To: [facility] [address]  Sabrina Mitchell [id number] February 27, 1961  08/09/2017 Pre-operative Diagnosis: esrd  Surgeon:  Eda Paschal. Donzetta Matters, MD  Procedure Performed: Revision of left arm av fistula ligation of side branch  F/u in 4-6 weeks with np for wound check

## 2017-08-26 ENCOUNTER — Ambulatory Visit: Payer: Self-pay | Admitting: Vascular Surgery

## 2017-09-06 ENCOUNTER — Encounter: Payer: Self-pay | Admitting: Family

## 2017-09-08 ENCOUNTER — Ambulatory Visit (INDEPENDENT_AMBULATORY_CARE_PROVIDER_SITE_OTHER): Payer: Self-pay | Admitting: Family

## 2017-09-08 ENCOUNTER — Encounter: Payer: Self-pay | Admitting: Family

## 2017-09-08 VITALS — BP 123/75 | HR 76 | Temp 98.6°F | Resp 18 | Ht 63.0 in | Wt 137.0 lb

## 2017-09-08 DIAGNOSIS — T82510D Breakdown (mechanical) of surgically created arteriovenous fistula, subsequent encounter: Secondary | ICD-10-CM

## 2017-09-08 DIAGNOSIS — T82898D Other specified complication of vascular prosthetic devices, implants and grafts, subsequent encounter: Secondary | ICD-10-CM

## 2017-09-08 DIAGNOSIS — Z992 Dependence on renal dialysis: Secondary | ICD-10-CM

## 2017-09-08 DIAGNOSIS — N186 End stage renal disease: Secondary | ICD-10-CM

## 2017-09-08 NOTE — Progress Notes (Signed)
Postoperative Access Visit   History of Present Illness  Sabrina Mitchell is a 56 y.o. year old female who is s/p revision of left arm av fistula ligation of side branch on 08-09-17 by Dr. Donzetta Matters. She had pain and ulceration in the midportion of the cephalic vein fistula. She also has a side branch that was known from previous fistulogram. Findings: Ulceration did not involve the fistula itself and was excised and primarily repaired. Side branch was large proximal 5 mm and was primarily ligated and divided.  She returns today for 4-6 weeks follow up for evaluation of operative site, translator is with her.  She dialyzes T-TH-S via left arm AV fistula, proximal and distal to the operative site.   The patient's incision is healed save for a small shallow disruption of the dermis at the incision (see photo below).  The patient notes no steal symptoms.  The patient is able to complete their activities of daily living.    For VQI Use Only  PRE-ADM LIVING: Home  AMB STATUS: Ambulatory   Past Medical History:  Diagnosis Date  . Anemia   . Anxiety   . Complication of anesthesia   . Depression   . End stage renal disease ([place])    hemodialysis 3x/wk @ [address]  . Family history of adverse reaction to anesthesia    sister vomiting after uterine cyst removal  . Gastritis   . GERD (gastroesophageal reflux disease)   . Hemorrhoids   . Hypertension   . Pneumonia    years ago  . PONV (postoperative nausea and vomiting)   . Renal disorder   . UTI (lower urinary tract infection) March 2014    Past Surgical History:  Procedure Laterality Date  . AV FISTULA PLACEMENT Left 07/05/09   Dr. Kellie Simmering  . COLONOSCOPY N/A 02/10/2016   SLF: 1. HEME postive stool due to colon polyps intrernal hemorrhoids 2. small internal hemrrohoids 3. moderate external hemorrhoids.   . ESOPHAGEAL DILATION N/A 02/10/2016   Procedure: ESOPHAGEAL DILATION;  Surgeon: Danie Binder, MD;  Location: [facility]  ENDO SUITE;  Service: Endoscopy;  Laterality: N/A;  . ESOPHAGOGASTRODUODENOSCOPY N/A 02/10/2016   SLF: patent Schatzki's ring , mild gastritis/ duodentitis.   Marland Kitchen FISTULOGRAM N/A 10/22/2014   Procedure: FISTULOGRAM;  Surgeon: Angelia Mould, MD;  Location: [facility] CATH LAB;  Service: Cardiovascular;  Laterality: N/A;  . PERIPHERAL VASCULAR CATHETERIZATION Left 12/09/2016   Procedure: A/V Fistulagram;  Surgeon: Waynetta Sandy, MD;  Location: [place] CV LAB;  Service: Cardiovascular;  Laterality: Left;  . REVISON OF ARTERIOVENOUS FISTULA Left 13/24/4010   Procedure: PLICATION OF BRACHIOCEPHALIC FISTULA;  Surgeon: Conrad Flintville, MD;  Location: [place];  Service: Vascular;  Laterality: Left;  . REVISON OF ARTERIOVENOUS FISTULA Left 08/09/2017   Procedure: REVISION OF ARTERIOVENOUS FISTULA LIGATION OF SIDE BRANCH;  Surgeon: Waynetta Sandy, MD;  Location: [place];  Service: Vascular;  Laterality: Left;  . TUBAL LIGATION  2004    Social History   Social History  . Marital status: Significant Other    Spouse name: N/A  . Number of children: 2  . Years of education: N/A   Occupational History  . Not on file.   Social History Main Topics  . Smoking status: Never Smoker  . Smokeless tobacco: Never Used  . Alcohol use No  . Drug use: No  . Sexual activity: Not Currently    Birth control/ protection: Surgical   Other Topics Concern  .  Not on file   Social History Narrative  . No narrative on file    Allergies  Allergen Reactions  . No Known Allergies     Current Outpatient Prescriptions on File Prior to Visit  Medication Sig Dispense Refill  . acetaminophen (TYLENOL) 325 MG tablet Take 650 mg by mouth every 6 (six) hours as needed for mild pain.     Marland Kitchen amLODipine (NORVASC) 10 MG tablet Take 10 mg by mouth at bedtime.     . calcium acetate (PHOSLO) 667 MG capsule Take 667 mg by mouth 3 (three) times daily with meals.    . calcium carbonate (TUMS - DOSED IN MG  ELEMENTAL CALCIUM) 500 MG chewable tablet Chew 2 tablets by mouth daily as needed for indigestion or heartburn.    . carvedilol (COREG) 6.25 MG tablet Take 6.25 mg by mouth at bedtime. Pt states takes 1/2 tab bid    . ethyl chloride spray APPLY TOPICALLY AS NEEDED (Patient taking differently: Apply 1 application topically as needed (for dialysis). ) 104 mL 0  . omeprazole (PRILOSEC) 20 MG capsule Take 1 capsule (20 mg total) by mouth daily. 1 PO 30 mins prior to breakfast and supper 30 capsule 3  . oxyCODONE-acetaminophen (ROXICET) 5-325 MG tablet Take 1 tablet by mouth every 6 (six) hours as needed. 10 tablet 0  . Vitamin D, Ergocalciferol, (DRISDOL) 50000 units CAPS capsule Take 1 capsule (50,000 Units total) by mouth every 7 (seven) days. (Patient taking differently: Take 50,000 Units by mouth every Tuesday. ) 12 capsule 0  . amitriptyline (ELAVIL) 50 MG tablet Take 1 tablet (50 mg total) by mouth at bedtime. (Patient not taking: Reported on 07/30/2017) 30 tablet 3  . dicyclomine (BENTYL) 20 MG tablet Take 1 tablet (20 mg total) by mouth 2 (two) times daily. (Patient not taking: Reported on 08/05/2017) 20 tablet 0   No current facility-administered medications on file prior to visit.       Physical Examination Vitals:   09/08/17 1230  BP: 123/75  Pulse: 76  Resp: 18  Temp: 98.6 F (37 C)  TempSrc: Oral  SpO2: 99%  Weight: 137 lb (62.1 kg)  Height: 5\' 3"  (1.6 m)   Body mass index is 24.27 kg/m.   Left upper arm AV fistula       Left UE: Incision is healed except for shallow separation, skin feels warm and normal, hand grip is 5/5, sensation in digits is intact, palpable thrill, bruit can be auscultated   Medical Decision Making  Sabrina Mitchell is a 56 y.o. year old female who presents s/p revision of left arm av fistula ligation of side branch on 08-09-17.    Dr. Donzetta Matters spoke with and examined pt.   Avoid accessing the shallow open area of the incision until fully  healed, about another month. May access proximal and distal to this.   Follow up as needed.   Shreena Baines, Sharmon Leyden, RN, MSN, FNP-C [facility] [place] Office: [phone]  09/08/2017, 12:32 PM  Clinic MD: Donzetta Matters

## 2017-09-22 ENCOUNTER — Encounter: Payer: Self-pay | Admitting: Physician Assistant

## 2017-09-22 ENCOUNTER — Ambulatory Visit: Payer: Self-pay | Attending: Internal Medicine | Admitting: Physician Assistant

## 2017-09-22 VITALS — BP 150/82 | HR 68 | Temp 98.4°F | Wt 136.6 lb

## 2017-09-22 DIAGNOSIS — G4700 Insomnia, unspecified: Secondary | ICD-10-CM | POA: Insufficient documentation

## 2017-09-22 DIAGNOSIS — F32A Depression, unspecified: Secondary | ICD-10-CM

## 2017-09-22 DIAGNOSIS — Z789 Other specified health status: Secondary | ICD-10-CM

## 2017-09-22 DIAGNOSIS — Z87440 Personal history of urinary (tract) infections: Secondary | ICD-10-CM | POA: Insufficient documentation

## 2017-09-22 DIAGNOSIS — B351 Tinea unguium: Secondary | ICD-10-CM | POA: Insufficient documentation

## 2017-09-22 DIAGNOSIS — N186 End stage renal disease: Secondary | ICD-10-CM | POA: Insufficient documentation

## 2017-09-22 DIAGNOSIS — I12 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 disease: Secondary | ICD-10-CM | POA: Insufficient documentation

## 2017-09-22 DIAGNOSIS — K219 Gastro-esophageal reflux disease without esophagitis: Secondary | ICD-10-CM | POA: Insufficient documentation

## 2017-09-22 DIAGNOSIS — F419 Anxiety disorder, unspecified: Secondary | ICD-10-CM | POA: Insufficient documentation

## 2017-09-22 DIAGNOSIS — Z992 Dependence on renal dialysis: Secondary | ICD-10-CM | POA: Insufficient documentation

## 2017-09-22 DIAGNOSIS — I158 Other secondary hypertension: Secondary | ICD-10-CM

## 2017-09-22 DIAGNOSIS — F329 Major depressive disorder, single episode, unspecified: Secondary | ICD-10-CM | POA: Insufficient documentation

## 2017-09-22 MED ORDER — TRAZODONE HCL 50 MG PO TABS: 50.0000 mg | ORAL_TABLET | Freq: Every evening | ORAL | 3 refills | Status: DC | PRN

## 2017-09-22 MED ORDER — CARVEDILOL 6.25 MG PO TABS: 6.2500 mg | ORAL_TABLET | Freq: Every day | ORAL | 1 refills | Status: DC

## 2017-09-22 NOTE — Progress Notes (Signed)
Patient ID: Sabrina Mitchell, female   DOB: April 15, 1961, 56 y.o.   MRN: [id number]     Sabrina Mitchell, is a 56 y.o. female  IHK:[id number]  VFI:[id number]  DOB - Mar 30, 1961  Subjective:  Chief Complaint and HPI: Sabrina Mitchell is a 56 y.o. female here today for multiple reasons. Trouble sleeping for the last 1-2 months.  No OTCs .  No new stressors.  She has been seen for this problem in the past.  She did not like elavil.  Temazepam works but she feels she would need a higher dose.   On dialysis Tuesday, thursdays, and saturdays X 7 years.  This causes her anxiety and depression.  She has family that is supportive.  She has never seen a counselor, but she is open to seeing one if she can afford it.  She denies SI/HI.   [employer] interpreters "Sabrina Mitchell" used.    Toenail fungus X many years.  Wants meds or to have toenails removed.   Concerned bc diastolic BP has been high recently every time she goes for dialysis.  No HA/CP/SOB/dizziness.    ROS:   Constitutional:  No f/c, No night sweats, No unexplained weight loss. EENT:  No vision changes, No blurry vision, No hearing changes. No mouth, throat, or ear problems.  Respiratory: No cough, No SOB Cardiac: No CP, no palpitations GI:  No abd pain, No N/V/D. GU: No Urinary s/sx Musculoskeletal: No joint pain Neuro: No headache, no dizziness, no motor weakness.  Skin: No rash Endocrine:  No polydipsia. No polyuria.  Psych: Denies SI/HI  No problems updated.  ALLERGIES: Allergies  Allergen Reactions  . No Known Allergies     PAST MEDICAL HISTORY: Past Medical History:  Diagnosis Date  . Anemia   . Anxiety   . Complication of anesthesia   . Depression   . End stage renal disease ([place])    hemodialysis 3x/wk @ [address]  . Family history of adverse reaction to anesthesia    sister vomiting after uterine cyst removal  . Gastritis   . GERD (gastroesophageal reflux disease)   . Hemorrhoids   .  Hypertension   . Pneumonia    years ago  . PONV (postoperative nausea and vomiting)   . Renal disorder   . UTI (lower urinary tract infection) March 2014    MEDICATIONS AT HOME: Prior to Admission medications   Medication Sig Start Date End Date Taking? Authorizing Provider  amLODipine (NORVASC) 10 MG tablet Take 10 mg by mouth at bedtime.    Yes [provider]  carvedilol (COREG) 6.25 MG tablet Take 1 tablet (6.25 mg total) by mouth at bedtime. 09/22/17  Yes Freeman Caldron M, PA-C  omeprazole (PRILOSEC) 20 MG capsule Take 1 capsule (20 mg total) by mouth daily. 1 PO 30 mins prior to breakfast and supper 08/06/17  Yes Ladell Pier, MD  Vitamin D, Ergocalciferol, (DRISDOL) 50000 units CAPS capsule Take 1 capsule (50,000 Units total) by mouth every 7 (seven) days. Patient taking differently: Take 50,000 Units by mouth every Tuesday.  03/23/17  Yes Langeland, Dawn T, MD  acetaminophen (TYLENOL) 325 MG tablet Take 650 mg by mouth every 6 (six) hours as needed for mild pain.     [provider]  calcium acetate (PHOSLO) 667 MG capsule Take 667 mg by mouth 3 (three) times daily with meals.    [provider]  calcium carbonate (TUMS - DOSED IN MG ELEMENTAL CALCIUM) 500 MG chewable tablet Chew 2  tablets by mouth daily as needed for indigestion or heartburn.    [provider]  dicyclomine (BENTYL) 20 MG tablet Take 1 tablet (20 mg total) by mouth 2 (two) times daily. Patient not taking: Reported on 08/05/2017 07/25/17   Charlann Lange, PA-C  ethyl chloride spray APPLY TOPICALLY AS NEEDED Patient taking differently: Apply 1 application topically as needed (for dialysis).  09/21/16   Tresa Garter, MD  oxyCODONE-acetaminophen (ROXICET) 5-325 MG tablet Take 1 tablet by mouth every 6 (six) hours as needed. Patient not taking: Reported on 09/22/2017 08/09/17 08/09/18  Waynetta Sandy, MD  traZODone (DESYREL) 50 MG tablet Take 1 tablet (50 mg total) by  mouth at bedtime as needed for sleep. 09/22/17   Argentina Donovan, PA-C     Objective:  EXAM:   Vitals:   09/22/17 1042  BP: (!) 166/79  Pulse: 68  Temp: 98.4 F (36.9 C)  TempSrc: Oral  SpO2: 99%  Weight: 136 lb 9.6 oz (62 kg)    General appearance : A&OX3. NAD. Non-toxic-appearing HEENT: Atraumatic and Normocephalic.  PERRLA. EOM intact.   Neck: supple, no JVD. No cervical lymphadenopathy. No thyromegaly Chest/Lungs:  Breathing-non-labored, Good air entry bilaterally, breath sounds normal without rales, rhonchi, or wheezing  CVS: S1 S2 regular, no murmurs, gallops, rubs  Extremities: Bilateral Lower Ext shows no edema, both legs are warm to touch with = pulse throughout.  Great toenails with extreme hypertrophy.  Neurology:  CN II-XII grossly intact, Non focal.   Psych:  TP linear. J/I WNL. Normal speech. Appropriate eye contact and affect.  Skin:  No Rash  Data Review Lab Results  Component Value Date   HGBA1C 4.8 03/22/2017     Assessment & Plan   1. Insomnia, unspecified type Can try- traZODone (DESYREL) 50 MG tablet; Take 1 tablet (50 mg total) by mouth at bedtime as needed for sleep.  Dispense: 30 tablet; Refill: 3  2. Depression, unspecified depression type - Ambulatory referral to Psychology  3. Other secondary hypertension Uncontrolled.  Increase dose.  - carvedilol (COREG) 6.25 MG tablet; Take 1 tablet (6.25 mg total) by mouth at bedtime.  Dispense: 60 tablet; Refill: 1  4.  Toenail fungus Refer podiatry  5.  Language barrier  Spent >50mins face to face with patient addressing each problem for today, obtaining history, and answering questions.     Patient have been counseled extensively about nutrition and exercise  Return in about 1 month (around 10/22/2017) for assign to Dr Johnson;f/up htn and insomnia.  The patient was given clear instructions to go to ER or return to medical center if symptoms don't improve, worsen or new problems develop.  The patient verbalized understanding. The patient was told to call to get lab results if they haven't heard anything in the next week.     Freeman Caldron, PA-C [facility] [place], [place]   09/22/2017, 11:22 AM

## 2017-09-22 NOTE — Patient Instructions (Signed)
Check blood pressure 3 to 5 times/week and record and bring to next visit.

## 2017-09-22 NOTE — Progress Notes (Signed)
Interpreter: [id number]  Pt has concerns for insomnia. Request medication for sleep. Check nail on right great toe.   Pt takes Axia

## 2017-10-29 ENCOUNTER — Ambulatory Visit: Payer: Self-pay | Admitting: Internal Medicine

## 2017-12-10 ENCOUNTER — Ambulatory Visit: Payer: Self-pay | Attending: Internal Medicine | Admitting: Internal Medicine

## 2017-12-10 ENCOUNTER — Encounter: Payer: Self-pay | Admitting: Internal Medicine

## 2017-12-10 VITALS — BP 114/63 | HR 71 | Temp 98.2°F | Resp 18 | Ht 60.0 in | Wt 138.0 lb

## 2017-12-10 DIAGNOSIS — N186 End stage renal disease: Secondary | ICD-10-CM | POA: Insufficient documentation

## 2017-12-10 DIAGNOSIS — Z124 Encounter for screening for malignant neoplasm of cervix: Secondary | ICD-10-CM

## 2017-12-10 DIAGNOSIS — Z1231 Encounter for screening mammogram for malignant neoplasm of breast: Secondary | ICD-10-CM

## 2017-12-10 DIAGNOSIS — Z992 Dependence on renal dialysis: Secondary | ICD-10-CM | POA: Insufficient documentation

## 2017-12-10 DIAGNOSIS — I10 Essential (primary) hypertension: Secondary | ICD-10-CM

## 2017-12-10 DIAGNOSIS — Z23 Encounter for immunization: Secondary | ICD-10-CM | POA: Insufficient documentation

## 2017-12-10 DIAGNOSIS — E559 Vitamin D deficiency, unspecified: Secondary | ICD-10-CM | POA: Insufficient documentation

## 2017-12-10 DIAGNOSIS — Z1239 Encounter for other screening for malignant neoplasm of breast: Secondary | ICD-10-CM

## 2017-12-10 DIAGNOSIS — R21 Rash and other nonspecific skin eruption: Secondary | ICD-10-CM | POA: Insufficient documentation

## 2017-12-10 DIAGNOSIS — K219 Gastro-esophageal reflux disease without esophagitis: Secondary | ICD-10-CM | POA: Insufficient documentation

## 2017-12-10 DIAGNOSIS — I12 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 disease: Secondary | ICD-10-CM | POA: Insufficient documentation

## 2017-12-10 DIAGNOSIS — G4700 Insomnia, unspecified: Secondary | ICD-10-CM | POA: Insufficient documentation

## 2017-12-10 MED ORDER — TRIAMCINOLONE ACETONIDE 0.1 % EX CREA: 1.0000 "application " | TOPICAL_CREAM | Freq: Two times a day (BID) | CUTANEOUS | 1 refills | Status: DC

## 2017-12-10 NOTE — Progress Notes (Signed)
Patient ID: Sabrina Mitchell, female    DOB: 07/24/1961  MRN: [id number]  CC: Hypertension   Subjective: Sabrina Mitchell is a 56 y.o. female who presents for chronic ds management Her concerns today include:  56 year old with end-stage renal disease on HD, hypertension, ACD, GERD, vit D def and insomnia.  HM: needs Tdap, MMG, PAP.  Had flu shot in HD 2 mths ago.    -She was called for the MMG but she wanted to see me first because nipple on RT side "has been turning black."  Started about 4-5 mths. No pain. +itching. -sister has breast cancer dx at age 8. -no abnormal paps in past. She is post menopausal. Sexually active with one female partner for a long time  ESRD/HTN: BP varies. Before HD SBP in 160s but after HD SBP 140 -compliant with Amlodipine and Coreg (she takes once a day) and salt restriction -no CP/SOB  Patient Active Problem List   Diagnosis Date Noted  . GERD (gastroesophageal reflux disease) 01/17/2016  . 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 Post-menopausal bleeding 04/06/2012  . End stage renal disease on dialysis ([place]) 04/06/2012  . Hypertension 04/06/2012     Current Outpatient Medications on File Prior to Visit  Medication Sig Dispense Refill  . acetaminophen (TYLENOL) 325 MG tablet Take 650 mg by mouth every 6 (six) hours as needed for mild pain.     Marland Kitchen amLODipine (NORVASC) 10 MG tablet Take 10 mg by mouth at bedtime.     . calcium acetate (PHOSLO) 667 MG capsule Take 667 mg by mouth 3 (three) times daily with meals.    . calcium carbonate (TUMS - DOSED IN MG ELEMENTAL CALCIUM) 500 MG chewable tablet Chew 2 tablets by mouth daily as needed for indigestion or heartburn.    . carvedilol (COREG) 6.25 MG tablet Take 1 tablet (6.25 mg total) by mouth at bedtime. 60 tablet 1  . ethyl chloride spray APPLY TOPICALLY AS NEEDED (Patient taking differently: Apply 1 application topically as needed (for dialysis). ) 104 mL 0  .  omeprazole (PRILOSEC) 20 MG capsule Take 1 capsule (20 mg total) by mouth daily. 1 PO 30 mins prior to breakfast and supper 30 capsule 3  . traZODone (DESYREL) 50 MG tablet Take 1 tablet (50 mg total) by mouth at bedtime as needed for sleep. 30 tablet 3  . Vitamin D, Ergocalciferol, (DRISDOL) 50000 units CAPS capsule Take 1 capsule (50,000 Units total) by mouth every 7 (seven) days. (Patient taking differently: Take 50,000 Units by mouth every Tuesday. ) 12 capsule 0   No current facility-administered medications on file prior to visit.     Allergies  Allergen Reactions  . No Known Allergies     Social History   Socioeconomic History  . Marital status: Significant Other    Spouse name: Not on file  . Number of children: 2  . Years of education: Not on file  . Highest education level: Not on file  Social Needs  . Financial resource strain: Not on file  . Food insecurity - worry: Not on file  . Food insecurity - inability: Not on file  . Transportation needs - medical: Not on file  . Transportation needs - non-medical: Not on file  Occupational History  . Not on file  Tobacco Use  . Smoking status: Never Smoker  . Smokeless tobacco: Never Used  Substance and Sexual Activity  . Alcohol use: No  . Drug use: No  .  Sexual activity: Not Currently    Birth control/protection: Surgical  Other Topics Concern  . Not on file  Social History Narrative  . Not on file    Family History  Problem Relation Age of Onset  . Hypertension Mother   . Hypertension Father   . Hypertension Sister   . Diabetes Brother   . Hypertension Brother     Past Surgical History:  Procedure Laterality Date  . AV FISTULA PLACEMENT Left 07/05/09   Dr. Kellie Simmering  . COLONOSCOPY N/A 02/10/2016   SLF: 1. HEME postive stool due to colon polyps intrernal hemorrhoids 2. small internal hemrrohoids 3. moderate external hemorrhoids.   . ESOPHAGEAL DILATION N/A 02/10/2016   Procedure: ESOPHAGEAL DILATION;  Surgeon:  Danie Binder, MD;  [facility];  Service: Endoscopy;  Laterality: N/A;  . ESOPHAGOGASTRODUODENOSCOPY N/A 02/10/2016   SLF: patent Schatzki's ring , mild gastritis/ duodentitis.   Marland Kitchen FISTULOGRAM N/A 10/22/2014   Procedure: FISTULOGRAM;  Surgeon: Angelia Mould, MD;  Location: [facility] CATH LAB;  Service: Cardiovascular;  Laterality: N/A;  . PERIPHERAL VASCULAR CATHETERIZATION Left 12/09/2016   Procedure: A/V Fistulagram;  Surgeon: Waynetta Sandy, MD;  Location: [place] CV LAB;  Service: Cardiovascular;  Laterality: Left;  . REVISON OF ARTERIOVENOUS FISTULA Left 36/64/4034   Procedure: PLICATION OF BRACHIOCEPHALIC FISTULA;  Surgeon: Conrad [place], MD;  Location: [place];  Service: Vascular;  Laterality: Left;  . REVISON OF ARTERIOVENOUS FISTULA Left 08/09/2017   Procedure: REVISION OF ARTERIOVENOUS FISTULA LIGATION OF SIDE BRANCH;  Surgeon: Waynetta Sandy, MD;  Location: [place];  Service: Vascular;  Laterality: Left;  . TUBAL LIGATION  2004    ROS: Review of Systems  Gastrointestinal:       Thinks she has hemorrhoid outside the rectum since birth of her last child. No rectal bleeding or itching  Skin:       Itching during and post HD. Hyper-pigementation of skin on arms and upper back    PHYSICAL EXAM: BP 114/63 (BP Location: Right Arm, Patient Position: Sitting, Cuff Size: Normal)   Pulse 71   Temp 98.2 F (36.8 C) (Oral)   Resp 18   Ht 5' (1.524 m)   Wt 138 lb (62.6 kg)   SpO2 99%   BMI 26.95 kg/m   Physical Exam  General appearance - alert, well appearing, and in no distress Mental status - alert, oriented to person, place, and time, normal mood, behavior, speech, dress, motor activity, and thought processes Neck - supple, no significant adenopathy Chest - clear to auscultation, no wheezes, rales or rhonchi, symmetric air entry Heart -RRR Breasts -3 x 3 cm area of mild hypopigmentation mid to upper portion of right breast.  No palpable masses  in either breast.  No cervical lymphadenopathy Pelvic - CMA, Bien present: normal external genitalia, vulva, vagina, cervix, uterus and adnexa Rectal -small skin tag located between the 10 and 12 o'clock position Extremities - no LE edema. HD fistula in LUE Skin: hyperpigmentation of upper outer arms and upper back  Lab Results  Component Value Date   HGBA1C 4.8 03/22/2017    ASSESSMENT AND PLAN: 1. Essential hypertension -at goal Cont Norvasc 2. End stage renal disease on dialysis ([place])   3. Breast cancer screening - MM Digital Screening; Future  4. Pap smear for cervical cancer screening - Cytology - PAP  5. Need for Tdap vaccination - Tdap vaccine greater than or equal to 7yo IM  6. Rash in adult -  triamcinolone cream (KENALOG) 0.1 %; Apply 1 application topically 2 (two) times daily.  Dispense: 45 g; Refill: 1  7. Rectal skin tag -advised to keep BM soft and regular  Patient was given the opportunity to ask questions.  Patient verbalized understanding of the plan and was able to repeat key elements of the plan.  [employer] interpreter used during this encounter.  Orders Placed This Encounter  Procedures  . MM Digital Screening  . Tdap vaccine greater than or equal to 7yo IM     Requested Prescriptions   Signed Prescriptions Disp Refills  . triamcinolone cream (KENALOG) 0.1 % 45 g 1    Sig: Apply 1 application topically 2 (two) times daily.    Return in about 3 months (around 03/10/2018).  Karle Plumber, MD, FACP

## 2017-12-14 LAB — CYTOLOGY - PAP
CHLAMYDIA, DNA PROBE: NEGATIVE
DIAGNOSIS: REACTIVE
Diagnosis: NEGATIVE
HPV: NOT DETECTED
Neisseria Gonorrhea: NEGATIVE
TRICH (WINDOWPATH): NEGATIVE

## 2017-12-17 ENCOUNTER — Telehealth: Payer: Self-pay

## 2017-12-17 NOTE — Telephone Encounter (Signed)
[place] [phone] contacted pt to go over results pt didn't answer lvm informing pt of results and if she has any questions or concerns to give [place] a call at the office

## 2017-12-31 ENCOUNTER — Ambulatory Visit: Payer: Self-pay | Attending: Internal Medicine

## 2018-02-23 ENCOUNTER — Ambulatory Visit: Payer: Self-pay | Attending: Internal Medicine | Admitting: Physician Assistant

## 2018-02-23 VITALS — BP 134/84 | HR 63 | Temp 98.3°F | Resp 16 | Wt 137.6 lb

## 2018-02-23 DIAGNOSIS — I12 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 disease: Secondary | ICD-10-CM | POA: Insufficient documentation

## 2018-02-23 DIAGNOSIS — N938 Other specified abnormal uterine and vaginal bleeding: Secondary | ICD-10-CM

## 2018-02-23 DIAGNOSIS — N186 End stage renal disease: Secondary | ICD-10-CM | POA: Insufficient documentation

## 2018-02-23 DIAGNOSIS — Z758 Other problems related to medical facilities and other health care: Secondary | ICD-10-CM

## 2018-02-23 DIAGNOSIS — N950 Postmenopausal bleeding: Secondary | ICD-10-CM

## 2018-02-23 DIAGNOSIS — G4700 Insomnia, unspecified: Secondary | ICD-10-CM

## 2018-02-23 DIAGNOSIS — K219 Gastro-esophageal reflux disease without esophagitis: Secondary | ICD-10-CM | POA: Insufficient documentation

## 2018-02-23 DIAGNOSIS — F329 Major depressive disorder, single episode, unspecified: Secondary | ICD-10-CM | POA: Insufficient documentation

## 2018-02-23 DIAGNOSIS — Z789 Other specified health status: Secondary | ICD-10-CM

## 2018-02-23 DIAGNOSIS — I10 Essential (primary) hypertension: Secondary | ICD-10-CM

## 2018-02-23 DIAGNOSIS — Z79899 Other long term (current) drug therapy: Secondary | ICD-10-CM | POA: Insufficient documentation

## 2018-02-23 NOTE — Patient Instructions (Addendum)
Melatonin 5-10mg  at bedtime for help with sleep

## 2018-02-23 NOTE — Progress Notes (Signed)
Patient ID: DEISHA STULL, female   DOB: 09/18/1961, 57 y.o.   MRN: [id number]     Lugenia Assefa, is a 57 y.o. female  LOV:[id number]  OAC:[id number]  DOB - 10/11/1961  Subjective:  Chief Complaint and HPI: Frady Taddeo is a 57 y.o. female here Stomach pain similar to menstrual cramping.  She did have some bleeding on Sunday when she wiped after urinating.  No heavy or consistent bleeding. No period since 8 years ago. Started dialysis about 8 years ago-goes on T, th, and Saturday.  No N/V/D.  Normal appetite.  No dysuria.  No gums bleeding or epistaxis.  She is very concerned that she is going to start having heavy bleeding the way she did when she was still menstruating.  No changes in BM.  No vaginal d/c.   Social:  Married  Poor sleep and trazadone makes her feel shaky and bad  Jose with [employer] interpreters translating  ROS:   Constitutional:  No f/c, No night sweats, No unexplained weight loss. EENT:  No vision changes, No blurry vision, No hearing changes. No mouth, throat, or ear problems.  Respiratory: No cough, No SOB Cardiac: No CP, no palpitations GI:  + abd pain, No N/V/D. GU: No Urinary s/sx Musculoskeletal: No joint pain Neuro: No headache, no dizziness, no motor weakness.  Skin: No rash Endocrine:  No polydipsia. No polyuria.  Psych: Denies SI/HI  No problems updated.  ALLERGIES: Allergies  Allergen Reactions  . No Known Allergies     PAST MEDICAL HISTORY: Past Medical History:  Diagnosis Date  . Anemia   . Anxiety   . Complication of anesthesia   . Depression   . End stage renal disease ([place])    hemodialysis 3x/wk @ [address]  . Family history of adverse reaction to anesthesia    sister vomiting after uterine cyst removal  . Gastritis   . GERD (gastroesophageal reflux disease)   . Hemorrhoids   . Hypertension   . Pneumonia    years ago  . PONV (postoperative nausea and vomiting)   . Renal disorder   . UTI  (lower urinary tract infection) March 2014    MEDICATIONS AT HOME: Prior to Admission medications   Medication Sig Start Date End Date Taking? Authorizing Provider  acetaminophen (TYLENOL) 325 MG tablet Take 650 mg by mouth every 6 (six) hours as needed for mild pain.     [provider]  amLODipine (NORVASC) 10 MG tablet Take 10 mg by mouth at bedtime.     [provider]  calcium acetate (PHOSLO) 667 MG capsule Take 667 mg by mouth 3 (three) times daily with meals.    [provider]  calcium carbonate (TUMS - DOSED IN MG ELEMENTAL CALCIUM) 500 MG chewable tablet Chew 2 tablets by mouth daily as needed for indigestion or heartburn.    [provider]  carvedilol (COREG) 6.25 MG tablet Take 1 tablet (6.25 mg total) by mouth at bedtime. 09/22/17   Argentina Donovan, PA-C  ethyl chloride spray APPLY TOPICALLY AS NEEDED Patient taking differently: Apply 1 application topically as needed (for dialysis).  09/21/16   Tresa Garter, MD  omeprazole (PRILOSEC) 20 MG capsule Take 1 capsule (20 mg total) by mouth daily. 1 PO 30 mins prior to breakfast and supper 08/06/17   Ladell Pier, MD  traZODone (DESYREL) 50 MG tablet Take 1 tablet (50 mg total) by mouth at bedtime as needed for sleep. 09/22/17   McClung,  Dionne Bucy, PA-C  triamcinolone cream (KENALOG) 0.1 % Apply 1 application topically 2 (two) times daily. 12/10/17   Ladell Pier, MD  Vitamin D, Ergocalciferol, (DRISDOL) 50000 units CAPS capsule Take 1 capsule (50,000 Units total) by mouth every 7 (seven) days. Patient taking differently: Take 50,000 Units by mouth every Tuesday.  03/23/17   Maren Reamer, MD     Objective:  EXAM:   Vitals:   02/23/18 0932  BP: 134/84  Pulse: 63  Resp: 16  Temp: 98.3 F (36.8 C)  TempSrc: Oral  SpO2: 100%  Weight: 137 lb 9.6 oz (62.4 kg)    General appearance : A&OX3. NAD. Non-toxic-appearing HEENT: Atraumatic and Normocephalic.  PERRLA. EOM  intact.  TM clear B. Mouth-MMM, post pharynx WNL w/o erythema, No PND. Neck: supple, no JVD. No cervical lymphadenopathy. No thyromegaly Chest/Lungs:  Breathing-non-labored, Good air entry bilaterally, breath sounds normal without rales, rhonchi, or wheezing  CVS: S1 S2 regular, no murmurs, gallops, rubs  Abdomen: Bowel sounds present, Non tender and not distended with no gaurding, rigidity or rebound. Extremities: Bilateral Lower Ext shows no edema, both legs are warm to touch with = pulse throughout Neurology:  CN II-XII grossly intact, Non focal.   Psych:  TP linear. J/I WNL. Normal speech. Appropriate eye contact and affect.  Skin:  No Rash  Data Review Lab Results  Component Value Date   HGBA1C 4.8 03/22/2017     Assessment & Plan   1. Dysfunctional uterine bleeding Small amount of blood 4 days ago when wiping/no heavy bleeding. - CBC with Differential/Platelet - TSH - Ambulatory referral to Gynecology - FSH/LH If bleeding becomes heavy, to ED.  Patient agrees.    2. Essential hypertension Controlled-continue current regimen  3. Post-menopausal bleeding See #1  4. Language barrier [employer] interpreters used and additional time performing visit was required.  Spent >14mins face to face obtaining h/o, answering questions reviewing sleep hygiene and pelvic pain   5.  Insomnia-I have advised her to take 1/2 trazadone 50mg  to see if she tolerates that better.  We have also discussed sleep hygiene and she can try melatonin.  She has a f/up appt with her PCP in 2 weeks and can address this then.    Patient have been counseled extensively about nutrition and exercise  Return for keep march appt with Dr Wynetta Emery.  The patient was given clear instructions to go to ER or return to medical center if symptoms don't improve, worsen or new problems develop. The patient verbalized understanding. The patient was told to call to get lab results if they haven't heard anything in the next  week.   Freeman Caldron, PA-C [facility] and [facility] [place], [place]   02/23/2018, 9:59 AM

## 2018-02-24 ENCOUNTER — Encounter: Payer: Self-pay | Admitting: Obstetrics & Gynecology

## 2018-02-24 LAB — CBC WITH DIFFERENTIAL/PLATELET
BASOS ABS: 0 10*3/uL (ref 0.0–0.2)
Basos: 0 %
EOS (ABSOLUTE): 0.2 10*3/uL (ref 0.0–0.4)
Eos: 3 %
Hematocrit: 38.1 % (ref 34.0–46.6)
Hemoglobin: 13 g/dL (ref 11.1–15.9)
IMMATURE GRANS (ABS): 0 10*3/uL (ref 0.0–0.1)
IMMATURE GRANULOCYTES: 0 %
LYMPHS: 26 %
Lymphocytes Absolute: 1.4 10*3/uL (ref 0.7–3.1)
MCH: 32.1 pg (ref 26.6–33.0)
MCHC: 34.1 g/dL (ref 31.5–35.7)
MCV: 94 fL (ref 79–97)
MONOS ABS: 0.4 10*3/uL (ref 0.1–0.9)
Monocytes: 7 %
NEUTROS PCT: 64 %
Neutrophils Absolute: 3.5 10*3/uL (ref 1.4–7.0)
PLATELETS: 183 10*3/uL (ref 150–379)
RBC: 4.05 x10E6/uL (ref 3.77–5.28)
RDW: 16.1 % — AB (ref 12.3–15.4)
WBC: 5.5 10*3/uL (ref 3.4–10.8)

## 2018-02-24 LAB — FSH/LH: LH: 190.8 m[IU]/mL

## 2018-02-24 LAB — TSH: TSH: 1.33 u[IU]/mL (ref 0.450–4.500)

## 2018-02-28 ENCOUNTER — Telehealth: Payer: Self-pay

## 2018-02-28 NOTE — Telephone Encounter (Signed)
[facility] ID# [id number] contacted pt to go over lab results pt didn't answer lvm asking pt to give me a call at her earliest convenience    If pt calls back please give results: Her labs are consistent that she is in menopause and should not have a period or vaginal bleeding anymore. I have referred you to gynecologist. Your other labs are normal including your blood count and your thyroid hormone. If you develop heavy vaginal bleeding, please go to the emergency room as we discussed.

## 2018-03-11 ENCOUNTER — Ambulatory Visit: Payer: Self-pay | Attending: Internal Medicine | Admitting: Internal Medicine

## 2018-03-11 ENCOUNTER — Encounter: Payer: Self-pay | Admitting: Internal Medicine

## 2018-03-11 VITALS — BP 129/79 | HR 77 | Temp 98.6°F | Resp 16 | Wt 137.2 lb

## 2018-03-11 DIAGNOSIS — E559 Vitamin D deficiency, unspecified: Secondary | ICD-10-CM | POA: Insufficient documentation

## 2018-03-11 DIAGNOSIS — Z79899 Other long term (current) drug therapy: Secondary | ICD-10-CM | POA: Insufficient documentation

## 2018-03-11 DIAGNOSIS — K219 Gastro-esophageal reflux disease without esophagitis: Secondary | ICD-10-CM | POA: Insufficient documentation

## 2018-03-11 DIAGNOSIS — G5622 Lesion of ulnar nerve, left upper limb: Secondary | ICD-10-CM

## 2018-03-11 DIAGNOSIS — R1030 Lower abdominal pain, unspecified: Secondary | ICD-10-CM | POA: Insufficient documentation

## 2018-03-11 DIAGNOSIS — Z992 Dependence on renal dialysis: Secondary | ICD-10-CM | POA: Insufficient documentation

## 2018-03-11 DIAGNOSIS — R200 Anesthesia of skin: Secondary | ICD-10-CM

## 2018-03-11 DIAGNOSIS — N950 Postmenopausal bleeding: Secondary | ICD-10-CM | POA: Insufficient documentation

## 2018-03-11 DIAGNOSIS — N186 End stage renal disease: Secondary | ICD-10-CM | POA: Insufficient documentation

## 2018-03-11 DIAGNOSIS — I10 Essential (primary) hypertension: Secondary | ICD-10-CM

## 2018-03-11 DIAGNOSIS — I12 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 disease: Secondary | ICD-10-CM | POA: Insufficient documentation

## 2018-03-11 DIAGNOSIS — G5620 Lesion of ulnar nerve, unspecified upper limb: Secondary | ICD-10-CM | POA: Insufficient documentation

## 2018-03-11 DIAGNOSIS — R202 Paresthesia of skin: Secondary | ICD-10-CM

## 2018-03-11 DIAGNOSIS — G4700 Insomnia, unspecified: Secondary | ICD-10-CM | POA: Insufficient documentation

## 2018-03-11 NOTE — Progress Notes (Signed)
Patient ID: Sabrina Mitchell, female    DOB: 1961/01/16  MRN: [id number]  CC: Follow-up   Subjective: Sabrina Mitchell is a 57 y.o. female who presents for chronic ds management Her concerns today include:  57 year old with ESRD on HD, HTN, ACD, GERD,vit D defand insomnia.  1.  Seen by PA last mth and reported some vaginal bleeding with lower abdominal cramps.  Occurred only one time when she wiped after urination.  No dysuria at the time.  No blood in towel at the time. Referred to GYN.  No appt as yet -feeling some suprapubic pain on and off since bleeding episode last mth.  No dysuria.  She describes it as the feeling she use to get when she is about to have a period.  No fever or back pain.  2.  Request cholesterol check C/o numbness/tingling  in LT 4-5 fingers and tingling on top and sole of LT foot x 3 wks -numbness in LT fingers intermittent and usually comes on during HD but can occur at other times also.  Last 10-15 mins.  -Tingling in the left foot is also intermittent.  No back pains.  Patient Active Problem List   Diagnosis Date Noted  . GERD (gastroesophageal reflux disease) 01/17/2016  . 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 Post-menopausal bleeding 04/06/2012  . End stage renal disease on dialysis ([place]) 04/06/2012  . Hypertension 04/06/2012     Current Outpatient Medications on File Prior to Visit  Medication Sig Dispense Refill  . acetaminophen (TYLENOL) 325 MG tablet Take 650 mg by mouth every 6 (six) hours as needed for mild pain.     Marland Kitchen amLODipine (NORVASC) 10 MG tablet Take 10 mg by mouth at bedtime.     . calcium acetate (PHOSLO) 667 MG capsule Take 667 mg by mouth 3 (three) times daily with meals.    . calcium carbonate (TUMS - DOSED IN MG ELEMENTAL CALCIUM) 500 MG chewable tablet Chew 2 tablets by mouth daily as needed for indigestion or heartburn.    . carvedilol (COREG) 6.25 MG tablet Take 1 tablet (6.25 mg  total) by mouth at bedtime. 60 tablet 1  . ethyl chloride spray APPLY TOPICALLY AS NEEDED (Patient taking differently: Apply 1 application topically as needed (for dialysis). ) 104 mL 0  . omeprazole (PRILOSEC) 20 MG capsule Take 1 capsule (20 mg total) by mouth daily. 1 PO 30 mins prior to breakfast and supper 30 capsule 3  . traZODone (DESYREL) 50 MG tablet Take 1 tablet (50 mg total) by mouth at bedtime as needed for sleep. 30 tablet 3  . triamcinolone cream (KENALOG) 0.1 % Apply 1 application topically 2 (two) times daily. 45 g 1  . Vitamin D, Ergocalciferol, (DRISDOL) 50000 units CAPS capsule Take 1 capsule (50,000 Units total) by mouth every 7 (seven) days. (Patient taking differently: Take 50,000 Units by mouth every Tuesday. ) 12 capsule 0   No current facility-administered medications on file prior to visit.     Allergies  Allergen Reactions  . No Known Allergies     Social History   Socioeconomic History  . Marital status: Significant Other    Spouse name: Not on file  . Number of children: 2  . Years of education: Not on file  . Highest education level: Not on file  Social Needs  . Financial resource strain: Not on file  . Food insecurity - worry: Not on file  . Food insecurity - inability:  Not on file  . Transportation needs - medical: Not on file  . Transportation needs - non-medical: Not on file  Occupational History  . Not on file  Tobacco Use  . Smoking status: Never Smoker  . Smokeless tobacco: Never Used  Substance and Sexual Activity  . Alcohol use: No  . Drug use: No  . Sexual activity: Not Currently    Birth control/protection: Surgical  Other Topics Concern  . Not on file  Social History Narrative  . Not on file    Family History  Problem Relation Age of Onset  . Hypertension Mother   . Hypertension Father   . Hypertension Sister   . Diabetes Brother   . Hypertension Brother     Past Surgical History:  Procedure Laterality Date  . AV  FISTULA PLACEMENT Left 07/05/09   Dr. Kellie Simmering  . COLONOSCOPY N/A 02/10/2016   SLF: 1. HEME postive stool due to colon polyps intrernal hemorrhoids 2. small internal hemrrohoids 3. moderate external hemorrhoids.   . ESOPHAGEAL DILATION N/A 02/10/2016   Procedure: ESOPHAGEAL DILATION;  Surgeon: Danie Binder, MD;  [facility];  Service: Endoscopy;  Laterality: N/A;  . ESOPHAGOGASTRODUODENOSCOPY N/A 02/10/2016   SLF: patent Schatzki's ring , mild gastritis/ duodentitis.   Marland Kitchen FISTULOGRAM N/A 10/22/2014   Procedure: FISTULOGRAM;  Surgeon: Angelia Mould, MD;  Location: [facility] CATH LAB;  Service: Cardiovascular;  Laterality: N/A;  . PERIPHERAL VASCULAR CATHETERIZATION Left 12/09/2016   Procedure: A/V Fistulagram;  Surgeon: Waynetta Sandy, MD;  Location: [place] CV LAB;  Service: Cardiovascular;  Laterality: Left;  . REVISON OF ARTERIOVENOUS FISTULA Left 87/86/7672   Procedure: PLICATION OF BRACHIOCEPHALIC FISTULA;  Surgeon: Conrad Seaton, MD;  Location: [place];  Service: Vascular;  Laterality: Left;  . REVISON OF ARTERIOVENOUS FISTULA Left 08/09/2017   Procedure: REVISION OF ARTERIOVENOUS FISTULA LIGATION OF SIDE BRANCH;  Surgeon: Waynetta Sandy, MD;  Location: [place];  Service: Vascular;  Laterality: Left;  . TUBAL LIGATION  2004    ROS: Review of Systems Negative except as stated above. PHYSICAL EXAM: BP 129/79   Pulse 77   Temp 98.6 F (37 C) (Oral)   Resp 16   Wt 137 lb 3.2 oz (62.2 kg)   SpO2 99%   BMI 26.80 kg/m   Physical Exam  General appearance - alert, well appearing, and in no distress Mental status - alert, oriented to person, place, and time Chest - clear to auscultation, no wheezes, rales or rhonchi, symmetric air entry Heart - RRR Neurological -grip 5/5 bilaterally.  No muscle wasting noted in the hands.  Gross sensation intact in both upper extremities.  Power in lower extremities 5/5.  Gross and fine sensation intact in both feet.  Gait  is normal. Extremities -no lower extremity edema.   ASSESSMENT AND PLAN: 1. Post-menopausal bleeding Await GYN appointment.  2. Essential hypertension At goal. - Lipid panel  3. Ulnar neuropathy of left upper extremity I think she has some ulnar neuropathy either at the wrist or elbow.  Normally I would recommend an elbow sleeve but her HD graft is in this extremity.  I recommend observing for now.  If it gets worse we can refer her for an EMG - Vitamin B12 - Folate  4. Numbness and tingling of foot - Vitamin B12 - Folate  5. Lower abdominal pain - Urinalysis   Patient was given the opportunity to ask questions.  Patient verbalized understanding of the plan and was  able to repeat key elements of the plan.  [employer] interpreter used during this encounter.   Orders Placed This Encounter  Procedures  . Lipid panel  . Vitamin B12  . Folate  . Urinalysis     Requested Prescriptions    No prescriptions requested or ordered in this encounter    Return in about 3 months (around 06/11/2018).  Karle Plumber, MD, FACP

## 2018-03-12 LAB — LIPID PANEL
CHOLESTEROL TOTAL: 140 mg/dL (ref 100–199)
Chol/HDL Ratio: 2.3 ratio (ref 0.0–4.4)
HDL: 60 mg/dL (ref >39)
LDL Calculated: 47 mg/dL (ref 0–99)
TRIGLYCERIDES: 163 mg/dL — AB (ref 0–149)
VLDL CHOLESTEROL CAL: 33 mg/dL (ref 5–40)

## 2018-03-12 LAB — FOLATE: Folate: 15.9 ng/mL (ref >3.0)

## 2018-03-12 LAB — VITAMIN B12: Vitamin B-12: 415 pg/mL (ref 232–1245)

## 2018-03-15 ENCOUNTER — Telehealth: Payer: Self-pay

## 2018-03-15 NOTE — Telephone Encounter (Signed)
[facility] [phone] contacted pt to go over lab results pt dind't answer left a detailed vm informing pt of results and if she has any questions or concerns to give me a call  If pt calls back please give results: cholesterol level is good. Vitamin B 12 and Folate levels are normal

## 2018-03-18 ENCOUNTER — Telehealth: Payer: Self-pay

## 2018-03-18 LAB — URINALYSIS
BILIRUBIN UA: NEGATIVE
KETONES UA: NEGATIVE
Nitrite, UA: NEGATIVE
SPEC GRAV UA: 1.009 (ref 1.005–1.030)
UUROB: 0.2 mg/dL (ref 0.2–1.0)
pH, UA: 8 — ABNORMAL HIGH (ref 5.0–7.5)

## 2018-03-18 NOTE — Telephone Encounter (Signed)
CMA call patient regarding lab results   Patient did not answer but CMA left a VM stating the reason of the call & to call back

## 2018-03-18 NOTE — Telephone Encounter (Signed)
-----   Message from Jackelyn Knife, [place] sent at 03/18/2018  1:25 PM EDT -----   ----- Message ----- From: Ladell Pier, MD Sent: 03/18/2018   8:41 AM To: Jackelyn Knife, RMA  Let pt know that UA neg for UTI.  Trace blood in urine.  Keep appt with GYN once it is scheduled.

## 2018-04-04 ENCOUNTER — Encounter: Payer: Self-pay | Admitting: Obstetrics & Gynecology

## 2018-06-10 ENCOUNTER — Encounter: Payer: Self-pay | Admitting: Internal Medicine

## 2018-06-10 ENCOUNTER — Ambulatory Visit: Payer: Self-pay | Attending: Internal Medicine | Admitting: Internal Medicine

## 2018-06-10 VITALS — BP 107/68 | HR 74 | Temp 99.0°F | Resp 16 | Wt 137.4 lb

## 2018-06-10 DIAGNOSIS — Z79899 Other long term (current) drug therapy: Secondary | ICD-10-CM | POA: Insufficient documentation

## 2018-06-10 DIAGNOSIS — I10 Essential (primary) hypertension: Secondary | ICD-10-CM

## 2018-06-10 DIAGNOSIS — N186 End stage renal disease: Secondary | ICD-10-CM | POA: Insufficient documentation

## 2018-06-10 DIAGNOSIS — I12 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 disease: Secondary | ICD-10-CM | POA: Insufficient documentation

## 2018-06-10 DIAGNOSIS — Z8249 Family history of ischemic heart disease and other diseases of the circulatory system: Secondary | ICD-10-CM | POA: Insufficient documentation

## 2018-06-10 DIAGNOSIS — L298 Other pruritus: Secondary | ICD-10-CM | POA: Insufficient documentation

## 2018-06-10 DIAGNOSIS — L299 Pruritus, unspecified: Secondary | ICD-10-CM

## 2018-06-10 DIAGNOSIS — Z8719 Personal history of other diseases of the digestive system: Secondary | ICD-10-CM | POA: Insufficient documentation

## 2018-06-10 DIAGNOSIS — Z833 Family history of diabetes mellitus: Secondary | ICD-10-CM | POA: Insufficient documentation

## 2018-06-10 DIAGNOSIS — K219 Gastro-esophageal reflux disease without esophagitis: Secondary | ICD-10-CM | POA: Insufficient documentation

## 2018-06-10 DIAGNOSIS — Z9889 Other specified postprocedural states: Secondary | ICD-10-CM | POA: Insufficient documentation

## 2018-06-10 DIAGNOSIS — L602 Onychogryphosis: Secondary | ICD-10-CM

## 2018-06-10 DIAGNOSIS — M79671 Pain in right foot: Secondary | ICD-10-CM | POA: Insufficient documentation

## 2018-06-10 DIAGNOSIS — Z992 Dependence on renal dialysis: Secondary | ICD-10-CM | POA: Insufficient documentation

## 2018-06-10 DIAGNOSIS — G4700 Insomnia, unspecified: Secondary | ICD-10-CM | POA: Insufficient documentation

## 2018-06-10 MED ORDER — HYDROXYZINE HCL 25 MG PO TABS: 12.5000 mg | ORAL_TABLET | Freq: Every evening | ORAL | 1 refills | Status: DC | PRN

## 2018-06-10 NOTE — Progress Notes (Signed)
Patient ID: Sabrina Mitchell, female    DOB: [birth year]  MRN: [id number]  CC: Hypertension   Subjective: Sabrina Mitchell is a 57 y.o. female who presents for chronic ds management Her concerns today include:  57 year old with ESRD on HD, HTN, ACD, GERD,vit D defand insomnia.  Insomnia: Ongoing issue for her.  Trazodone "makes me shake."   She is wanting Diazepam which she states her nephrologist at dialysis was prescribing for her in the past but he told her that he can not continue to prescribe.   -gets in bed around 10:30 p.m.  She turns all lights and sounds off.  She toss and turns.  For quite a while and is not sure that she ever sleeps.  Denies drinking any caffeinated beverages in the evenings.  Pain in RT heel x 2 mths.  Worse when she first starts walking after periods of rest.  No injury to the heel.  HTN: Reports compliance with amlodipine and carvedilol No chest pains or shortness of breath.  Complains of a lot of itching and irritation at times around her dialysis graft in the left upper extremity.  She has a cream which her dialysis physician has given her to use as needed.  She sometimes also has itching on her back.  Patient Active Problem List   Diagnosis Date Noted  . 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 GERD (gastroesophageal reflux disease) 01/17/2016  . 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 Post-menopausal bleeding 04/06/2012  . End stage renal disease on dialysis ([place]) 04/06/2012  . Hypertension 04/06/2012     Current Outpatient Medications on File Prior to Visit  Medication Sig Dispense Refill  . acetaminophen (TYLENOL) 325 MG tablet Take 650 mg by mouth every 6 (six) hours as needed for mild pain.     Marland Kitchen amLODipine (NORVASC) 10 MG tablet Take 10 mg by mouth at bedtime.     . calcium acetate (PHOSLO) 667 MG capsule Take 667 mg by mouth 3 (three) times daily with meals.    . calcium carbonate  (TUMS - DOSED IN MG ELEMENTAL CALCIUM) 500 MG chewable tablet Chew 2 tablets by mouth daily as needed for indigestion or heartburn.    . carvedilol (COREG) 6.25 MG tablet Take 1 tablet (6.25 mg total) by mouth at bedtime. 60 tablet 1  . ethyl chloride spray APPLY TOPICALLY AS NEEDED (Patient taking differently: Apply 1 application topically as needed (for dialysis). ) 104 mL 0  . omeprazole (PRILOSEC) 20 MG capsule Take 1 capsule (20 mg total) by mouth daily. 1 PO 30 mins prior to breakfast and supper 30 capsule 3  . triamcinolone cream (KENALOG) 0.1 % Apply 1 application topically 2 (two) times daily. 45 g 1  . Vitamin D, Ergocalciferol, (DRISDOL) 50000 units CAPS capsule Take 1 capsule (50,000 Units total) by mouth every 7 (seven) days. (Patient taking differently: Take 50,000 Units by mouth every Tuesday. ) 12 capsule 0   No current facility-administered medications on file prior to visit.     Allergies  Allergen Reactions  . No Known Allergies   . Trazodone And Nefazodone     Makes her shake    Social History   Socioeconomic History  . Marital status: Significant Other    Spouse name: Not on file  . Number of children: 2  . Years of education: Not on file  . Highest education level: Not on file  Occupational History  . Not on file  Social Needs  . Financial resource strain: Not on file  . Food insecurity:    Worry: Not on file    Inability: Not on file  . Transportation needs:    Medical: Not on file    Non-medical: Not on file  Tobacco Use  . Smoking status: Never Smoker  . Smokeless tobacco: Never Used  Substance and Sexual Activity  . Alcohol use: No  . Drug use: No  . Sexual activity: Not Currently    Birth control/protection: Surgical  Lifestyle  . Physical activity:    Days per week: Not on file    Minutes per session: Not on file  . Stress: Not on file  Relationships  . Social connections:    Talks on phone: Not on file    Gets together: Not on file     Attends religious service: Not on file    Active member of club or organization: Not on file    Attends meetings of clubs or organizations: Not on file    Relationship status: Not on file  . Intimate partner violence:    Fear of current or ex partner: Not on file    Emotionally abused: Not on file    Physically abused: Not on file    Forced sexual activity: Not on file  Other Topics Concern  . Not on file  Social History Narrative  . Not on file    Family History  Problem Relation Age of Onset  . Hypertension Mother   . Hypertension Father   . Hypertension Sister   . Diabetes Brother   . Hypertension Brother     Past Surgical History:  Procedure Laterality Date  . AV FISTULA PLACEMENT Left 07/05/09   Dr. Kellie Simmering  . COLONOSCOPY N/A 02/10/2016   SLF: 1. HEME postive stool due to colon polyps intrernal hemorrhoids 2. small internal hemrrohoids 3. moderate external hemorrhoids.   . ESOPHAGEAL DILATION N/A 02/10/2016   Procedure: ESOPHAGEAL DILATION;  Surgeon: Danie Binder, MD;  [facility];  Service: Endoscopy;  Laterality: N/A;  . ESOPHAGOGASTRODUODENOSCOPY N/A 02/10/2016   SLF: patent Schatzki's ring , mild gastritis/ duodentitis.   Marland Kitchen FISTULOGRAM N/A 10/22/2014   Procedure: FISTULOGRAM;  Surgeon: Angelia Mould, MD;  Location[facility] LAB;  Service: Cardiovascular;  Laterality: N/A;  . PERIPHERAL VASCULAR CATHETERIZATION Left 12/09/2016   Procedure: A/V Fistulagram;  Surgeon: Waynetta Sandy, MD;  Location: [facility] CV LAB;  Service: Cardiovascular;  Laterality: Left;  . REVISON OF ARTERIOVENOUS FISTULA Left 44/31/5400   Procedure: PLICATION OF BRACHIOCEPHALIC FISTULA;  Surgeon: Conrad Hawarden, MD;  Location: [facility];  Service: Vascular;  Laterality: Left;  . REVISON OF ARTERIOVENOUS FISTULA Left 08/09/2017   Procedure: REVISION OF ARTERIOVENOUS FISTULA LIGATION OF SIDE BRANCH;  Surgeon: Waynetta Sandy, MD;  Location: [facility];  Service: Vascular;   Laterality: Left;  . TUBAL LIGATION  2004    ROS: Review of Systems Negative except as stated above. PHYSICAL EXAM: BP 107/68   Pulse 74   Temp 99 F (37.2 C) (Oral)   Resp 16   Wt 137 lb 6.4 oz (62.3 kg)   SpO2 98%   BMI 26.83 kg/m   Physical Exam  General appearance - alert, well appearing, and in no distress Mental status - normal mood, behavior, speech, dress, motor activity, and thought processes Neck - supple, no significant adenopathy Chest - clear to auscultation, no wheezes, rales or rhonchi, symmetric air entry Heart - normal  rate, regular rhythm, Musculoskeletal -right foot: No edema or erythema noted.  Mild tenderness on palpation of the plantar heel. Extremities - peripheral pulses normal, no pedal edema,  Skin: Patient noted during this visit to incessantly scratch around her dialysis graft and her back.  No rash noted on the back or around the dialysis graft. Toenail on right big toe noted to be very overgrown and curled over the front of the toe.  ASSESSMENT AND PLAN: 1. Essential hypertension At goal.  Continue current medications and salt restriction.  2. Overgrown nail - Ambulatory referral to Podiatry  3. Pain of right heel Likely plantar fasciitis versus heel spur.  Recommend heel exercises including alternating icing and heat. - DG Foot Complete Right; Future - Ambulatory referral to Podiatry  4. Insomnia, unspecified type Good sleep hygiene discussed which it sounds as though she is already doing.  Stop trazodone. We will try her with low-dose of hydroxyzine to use as needed to help with the itching but may also be beneficial in bringing about some sedation at night.  Low-dose recommended for patients on dialysis. - hydrOXYzine (ATARAX/VISTARIL) 25 MG tablet; Take 0.5 tablets (12.5 mg total) by mouth at bedtime as needed (insomnia and itching).  Dispense: 30 tablet; Refill: 1  5. Itching - hydrOXYzine (ATARAX/VISTARIL) 25 MG tablet; Take 0.5  tablets (12.5 mg total) by mouth at bedtime as needed (insomnia and itching).  Dispense: 30 tablet; Refill: 1   Patient was given the opportunity to ask questions.  Patient verbalized understanding of the plan and was able to repeat key elements of the plan.  [employer] interpreter used during this encounter.  Orders Placed This Encounter  Procedures  . DG Foot Complete Right  . Ambulatory referral to Podiatry     Requested Prescriptions   Signed Prescriptions Disp Refills  . hydrOXYzine (ATARAX/VISTARIL) 25 MG tablet 30 tablet 1    Sig: Take 0.5 tablets (12.5 mg total) by mouth at bedtime as needed (insomnia and itching).    Return in about 4 months (around 10/10/2018).  Karle Plumber, MD, FACP

## 2018-06-14 ENCOUNTER — Ambulatory Visit (HOSPITAL_COMMUNITY): Payer: Self-pay

## 2018-06-16 ENCOUNTER — Other Ambulatory Visit: Payer: Self-pay | Admitting: Obstetrics and Gynecology

## 2018-06-16 DIAGNOSIS — Z1231 Encounter for screening mammogram for malignant neoplasm of breast: Secondary | ICD-10-CM

## 2018-06-20 ENCOUNTER — Encounter (HOSPITAL_COMMUNITY): Payer: Self-pay | Admitting: Emergency Medicine

## 2018-06-20 ENCOUNTER — Emergency Department (HOSPITAL_COMMUNITY): Payer: Self-pay

## 2018-06-20 ENCOUNTER — Other Ambulatory Visit: Payer: Self-pay

## 2018-06-20 ENCOUNTER — Emergency Department (HOSPITAL_COMMUNITY)
Admission: EM | Admit: 2018-06-20 | Discharge: 2018-06-20 | Disposition: A | Discharge status: Home / Self Care | Payer: Self-pay | Attending: Physician Assistant | Admitting: Physician Assistant

## 2018-06-20 DIAGNOSIS — Z79899 Other long term (current) drug therapy: Secondary | ICD-10-CM | POA: Insufficient documentation

## 2018-06-20 DIAGNOSIS — M7731 Calcaneal spur, right foot: Secondary | ICD-10-CM

## 2018-06-20 DIAGNOSIS — B353 Tinea pedis: Secondary | ICD-10-CM | POA: Insufficient documentation

## 2018-06-20 DIAGNOSIS — N186 End stage renal disease: Secondary | ICD-10-CM | POA: Insufficient documentation

## 2018-06-20 DIAGNOSIS — M722 Plantar fascial fibromatosis: Secondary | ICD-10-CM | POA: Insufficient documentation

## 2018-06-20 DIAGNOSIS — I12 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 disease: Secondary | ICD-10-CM | POA: Insufficient documentation

## 2018-06-20 DIAGNOSIS — L602 Onychogryphosis: Secondary | ICD-10-CM

## 2018-06-20 MED ORDER — KETOCONAZOLE 2 % EX CREA: TOPICAL_CREAM | CUTANEOUS | 0 refills | Status: DC

## 2018-06-20 NOTE — Discharge Instructions (Signed)
Return to the ER for any new or worsening symptoms. Follow up with the [facility] your foot 3-5 times daily.

## 2018-06-20 NOTE — ED Triage Notes (Signed)
Pt complaining of pain in her right heel for a week. Pt is able to ambulate. Denies any injury to site.

## 2018-06-20 NOTE — ED Provider Notes (Signed)
[facility] EMERGENCY DEPARTMENT Provider Note   CSN: [id number] Arrival date & time: 06/20/18  0174     History   Chief Complaint Chief Complaint  Patient presents with  . Foot Pain    HPI Sabrina Mitchell is a 57 y.o. female with a past medical history of end-stage renal disease who dialyzes Tuesday Thursday Saturday, last dialysis on Saturday 2 days ago.  She presents with complaint of right foot pain.  Patient states that she has had pain in her right heel for about 2 months.  She states it hurts every time she steps but is worse after she has been resting for period of time.  She denies any heat redness or swelling.  She denies any known injuries.  HPI  Past Medical History:  Diagnosis Date  . Anemia   . Anxiety   . Complication of anesthesia   . Depression   . End stage renal disease ([place])    hemodialysis 3x/wk @ [address]  . Family history of adverse reaction to anesthesia    sister vomiting after uterine cyst removal  . Gastritis   . GERD (gastroesophageal reflux disease)   . Hemorrhoids   . Hypertension   . Pneumonia    years ago  . PONV (postoperative nausea and vomiting)   . Renal disorder   . UTI (lower urinary tract infection) March 2014    Patient Active Problem List   Diagnosis Date Noted  . 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 GERD (gastroesophageal reflux disease) 01/17/2016  . 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 Post-menopausal bleeding 04/06/2012  . End stage renal disease on dialysis ([place]) 04/06/2012  . Hypertension 04/06/2012    Past Surgical History:  Procedure Laterality Date  . AV FISTULA PLACEMENT Left 07/05/09   Dr. Kellie Simmering  . COLONOSCOPY N/A 02/10/2016   SLF: 1. HEME postive stool due to colon polyps intrernal hemorrhoids 2. small internal hemrrohoids 3. moderate external hemorrhoids.   . ESOPHAGEAL DILATION N/A 02/10/2016   Procedure: ESOPHAGEAL  DILATION;  Surgeon: Danie Binder, MD;  [facility];  Service: Endoscopy;  Laterality: N/A;  . ESOPHAGOGASTRODUODENOSCOPY N/A 02/10/2016   SLF: patent Schatzki's ring , mild gastritis/ duodentitis.   Marland Kitchen FISTULOGRAM N/A 10/22/2014   Procedure: FISTULOGRAM;  Surgeon: Angelia Mould, MD;  Location: [facility] CATH LAB;  Service: Cardiovascular;  Laterality: N/A;  . PERIPHERAL VASCULAR CATHETERIZATION Left 12/09/2016   Procedure: A/V Fistulagram;  Surgeon: Waynetta Sandy, MD;  Location: [facility] CV LAB;  Service: Cardiovascular;  Laterality: Left;  . REVISON OF ARTERIOVENOUS FISTULA Left 94/49/6759   Procedure: PLICATION OF BRACHIOCEPHALIC FISTULA;  Surgeon: Conrad [place], MD;  Location: [facility];  Service: Vascular;  Laterality: Left;  . REVISON OF ARTERIOVENOUS FISTULA Left 08/09/2017   Procedure: REVISION OF ARTERIOVENOUS FISTULA LIGATION OF SIDE BRANCH;  Surgeon: Waynetta Sandy, MD;  Location: [facility];  Service: Vascular;  Laterality: Left;  . TUBAL LIGATION  2004     OB History   None      Home Medications    Prior to Admission medications   Medication Sig Start Date End Date Taking? Authorizing Provider  acetaminophen (TYLENOL) 325 MG tablet Take 650 mg by mouth every 6 (six) hours as needed for mild pain.     [provider]  amLODipine (NORVASC) 10 MG tablet Take 10 mg by mouth at bedtime.     [provider]  calcium acetate (PHOSLO) 667 MG capsule Take 667 mg by mouth 3 (three) times daily with meals.    [provider]  calcium carbonate (TUMS - DOSED IN MG ELEMENTAL CALCIUM) 500 MG chewable tablet Chew 2 tablets by mouth daily as needed for indigestion or heartburn.    [provider]  carvedilol (COREG) 6.25 MG tablet Take 1 tablet (6.25 mg total) by mouth at bedtime. 09/22/17   Argentina Donovan, PA-C  ethyl chloride spray APPLY TOPICALLY AS NEEDED Patient taking differently: Apply 1 application topically as needed  (for dialysis).  09/21/16   Tresa Garter, MD  hydrOXYzine (ATARAX/VISTARIL) 25 MG tablet Take 0.5 tablets (12.5 mg total) by mouth at bedtime as needed (insomnia and itching). 06/10/18   Ladell Pier, MD  omeprazole (PRILOSEC) 20 MG capsule Take 1 capsule (20 mg total) by mouth daily. 1 PO 30 mins prior to breakfast and supper 08/06/17   Ladell Pier, MD  triamcinolone cream (KENALOG) 0.1 % Apply 1 application topically 2 (two) times daily. 12/10/17   Ladell Pier, MD  Vitamin D, Ergocalciferol, (DRISDOL) 50000 units CAPS capsule Take 1 capsule (50,000 Units total) by mouth every 7 (seven) days. Patient taking differently: Take 50,000 Units by mouth every Tuesday.  03/23/17   Maren Reamer, MD    Family History Family History  Problem Relation Age of Onset  . Hypertension Mother   . Hypertension Father   . Hypertension Sister   . Diabetes Brother   . Hypertension Brother     Social History Social History   Tobacco Use  . Smoking status: Never Smoker  . Smokeless tobacco: Never Used  Substance Use Topics  . Alcohol use: No  . Drug use: No     Allergies   No known allergies and Trazodone and nefazodone   Review of Systems Review of Systems  Musculoskeletal: Positive for gait problem.  Neurological: Negative for weakness.     Physical Exam Updated Vital Signs BP 118/69 (BP Location: Right Arm)   Pulse 81   Temp 98.4 F (36.9 C) (Oral)   Resp 16   SpO2 100%   Physical Exam Physical Exam  Nursing note and vitals reviewed. Constitutional: She is oriented to person, place, and time. She appears well-developed and well-nourished. No distress.  HENT:  Head: Normocephalic and atraumatic.  Eyes: Conjunctivae normal and EOM are normal. Pupils are equal, round, and reactive to light. No scleral icterus.  Neck: Normal range of motion.  Cardiovascular: Normal rate, regular rhythm and normal heart sounds.  Exam reveals no gallop and no friction rub.    No murmur heard. Pulmonary/Chest: Effort normal and breath sounds normal. No respiratory distress.  Abdominal: Soft. Bowel sounds are normal. She exhibits no distension and no mass. There is no tenderness. There is no guarding.  Neurological: She is alert and oriented to person, place, and time.  Foot exam: Bilateral peeling skin in moccasin distribution.  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  Tender to palpation at the base of the right heel.  No swelling heat or redness. Skin: Skin is warm and dry. She is not diaphoretic.     ED Treatments / Results  Labs (all labs ordered are listed, but only abnormal results are displayed) Labs Reviewed - No data to display  EKG None  Radiology No results found.  Procedures Procedures (including critical care time)  Medications Ordered  in ED Medications - No data to display   Initial Impression / Assessment and Plan / ED Course  I have reviewed the triage vital signs and the nursing notes.  Pertinent labs & imaging results that were available during my care of the patient were reviewed by me and considered in my medical decision making (see chart for details).     Patient x-ray shows a large heel spur likely causing plantar fasciitis.  No evidence of infection other than superficial tinea pedis and the onychogryphosis that was mentioned previously.  Patient is advised to follow closely with podiatry.  She has end-stage renal and cannot take NSAIDs.  She is to ice 3-5 times daily.  She appears appropriate for discharge at this time  Final Clinical Impressions(s) / ED Diagnoses   Final diagnoses:  None    ED Discharge Orders    None       Margarita Mail, PA-C 06/20/18 1103    [place], Fredia Sorrow, MD 06/20/18 1544

## 2018-06-20 NOTE — ED Notes (Signed)
Patient transported to X-ray 

## 2018-06-28 ENCOUNTER — Encounter (HOSPITAL_COMMUNITY): Payer: Self-pay | Admitting: *Deleted

## 2018-06-28 ENCOUNTER — Ambulatory Visit (HOSPITAL_COMMUNITY)
Admission: RE | Admit: 2018-06-28 | Discharge: 2018-06-28 | Disposition: A | Discharge status: Home / Self Care | Payer: Self-pay | Source: Ambulatory Visit | Attending: Obstetrics and Gynecology | Admitting: Obstetrics and Gynecology

## 2018-06-28 ENCOUNTER — Ambulatory Visit
Admission: RE | Admit: 2018-06-28 | Discharge: 2018-06-28 | Disposition: A | Discharge status: Home / Self Care | Payer: Self-pay | Source: Ambulatory Visit | Attending: Obstetrics and Gynecology | Admitting: Obstetrics and Gynecology

## 2018-06-28 VITALS — BP 118/76 | Wt 139.0 lb

## 2018-06-28 DIAGNOSIS — Z1231 Encounter for screening mammogram for malignant neoplasm of breast: Secondary | ICD-10-CM

## 2018-06-28 DIAGNOSIS — Z1239 Encounter for other screening for malignant neoplasm of breast: Secondary | ICD-10-CM

## 2018-06-28 NOTE — Progress Notes (Signed)
No complaints today.   Pap Smear: Pap smear not completed today. Last Pap smear was 12/10/2017 and normal with negative HPV. Per patient has no history of an abnormal Pap smear. Last two Pap smear results are in Epic.  Physical exam: Breasts Breasts symmetrical. No skin abnormalities left breast. Observed a darkened area at the right upper inner breast that per patient has been there for 6-7 years. No nipple retraction bilateral breasts. No nipple discharge bilateral breasts. No lymphadenopathy. No lumps palpated bilateral breasts. No complaints of pain or tenderness on exam. Referred patient to the [facility] for a screening mammogram. Appointment scheduled for Tuesday, June 28, 2018 at 0910.        Pelvic/Bimanual No Pap smear completed today since last Pap smear and HPV typing was 12/10/2017. Pap smear not indicated per BCCCP guidelines.   Smoking History: Patient has never smoked.  Patient Navigation: Patient education provided. Access to services provided for patient through [facility] program. Spanish interpreter provided.   Colorectal Cancer Screening: Patient had a colonoscopy completed 02/10/2016. No complaints today. FIT Test given to patient to complete and return to BCCCP.  Breast and Cervical Cancer Risk Assessment: Patient has a family history of her sister having breast cancer. Patient has no known genetic mutations or history of radiation treatment to the chest before age 68. Patient has no history of cervical dysplasia, immunocompromised, or DES exposure in-utero.   Risk Assessment    Risk Scores      06/28/2018   Last edited by: Armond Hang, LPN   5-year risk: 1.4 %   Lifetime risk: 9.8 %          Used Spanish interpreter Rudene Anda from [place].

## 2018-06-28 NOTE — Patient Instructions (Signed)
Explained breast self awareness with Angelique Holm. Patient did not need a Pap smear today due to last Pap smear and HPV typing was 12/10/2017. Let her know BCCCP will cover Pap smears and HPV typing every 5 years unless has a history of abnormal Pap smears. Referred patient to the [facility] for a screening mammogram. Appointment scheduled for Tuesday, June 28, 2018 at 0910. Let [facility] know [facility] will [facility] up with her within the next couple weeks with results of mammogram by letter or phone. Sabrina Mitchell verbalized understanding.  [place], Arvil Chaco, RN 8:39 AM

## 2018-06-29 ENCOUNTER — Encounter (HOSPITAL_COMMUNITY): Payer: Self-pay | Admitting: *Deleted

## 2018-08-05 ENCOUNTER — Ambulatory Visit: Payer: PRIVATE HEALTH INSURANCE | Attending: Internal Medicine

## 2018-10-10 ENCOUNTER — Encounter: Payer: Self-pay | Admitting: Internal Medicine

## 2018-10-10 ENCOUNTER — Ambulatory Visit: Payer: Self-pay | Attending: Internal Medicine | Admitting: Internal Medicine

## 2018-10-10 VITALS — BP 130/68 | HR 65 | Temp 98.5°F | Resp 16 | Wt 137.0 lb

## 2018-10-10 DIAGNOSIS — I10 Essential (primary) hypertension: Secondary | ICD-10-CM

## 2018-10-10 DIAGNOSIS — M7731 Calcaneal spur, right foot: Secondary | ICD-10-CM

## 2018-10-10 DIAGNOSIS — L299 Pruritus, unspecified: Secondary | ICD-10-CM

## 2018-10-10 DIAGNOSIS — Z9889 Other specified postprocedural states: Secondary | ICD-10-CM | POA: Insufficient documentation

## 2018-10-10 DIAGNOSIS — Z8249 Family history of ischemic heart disease and other diseases of the circulatory system: Secondary | ICD-10-CM | POA: Insufficient documentation

## 2018-10-10 DIAGNOSIS — Z79899 Other long term (current) drug therapy: Secondary | ICD-10-CM | POA: Insufficient documentation

## 2018-10-10 DIAGNOSIS — N186 End stage renal disease: Secondary | ICD-10-CM | POA: Insufficient documentation

## 2018-10-10 DIAGNOSIS — Z9851 Tubal ligation status: Secondary | ICD-10-CM | POA: Insufficient documentation

## 2018-10-10 DIAGNOSIS — I12 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 disease: Secondary | ICD-10-CM | POA: Insufficient documentation

## 2018-10-10 DIAGNOSIS — G4700 Insomnia, unspecified: Secondary | ICD-10-CM

## 2018-10-10 DIAGNOSIS — K219 Gastro-esophageal reflux disease without esophagitis: Secondary | ICD-10-CM | POA: Insufficient documentation

## 2018-10-10 MED ORDER — CARVEDILOL 6.25 MG PO TABS: 6.2500 mg | ORAL_TABLET | Freq: Every day | ORAL | 3 refills | Status: DC

## 2018-10-10 MED ORDER — ETHYL CHLORIDE EX AERO: 1.0000 "application " | INHALATION_SPRAY | CUTANEOUS | 1 refills | Status: DC | PRN

## 2018-10-10 MED ORDER — HYDROXYZINE HCL 25 MG PO TABS: 12.5000 mg | ORAL_TABLET | Freq: Every evening | ORAL | 1 refills | Status: DC | PRN

## 2018-10-10 MED ORDER — AMLODIPINE BESYLATE 10 MG PO TABS: 10.0000 mg | ORAL_TABLET | Freq: Every day | ORAL | 3 refills | Status: DC

## 2018-10-10 NOTE — Progress Notes (Signed)
Patient ID: Sabrina Mitchell, female    DOB: 01/26/1961  MRN: [id number]  CC: Chronic disease management.  Subjective: Sabrina Mitchell is a 57 y.o. female who presents for chronic ds management Her concerns today include:  57 year old withESRDon HD, HTN, ACD, GERD,vit D defand insomnia.  RT heel pain: Discussed on last visit.  X-ray revealed moderately large plantar spur. She was called by podiatrist's office for appt but she does not know how to access messages on her phone.  Request rxn for a spray called Ethyl Chloride Mist Spray. It was prescribed by her nephrologist to be used on her dialysis graft prior to being connected to the dialysis machine.  It decreases pain over her dialysis graft.  HTN:  No device to check BP.  Reports compliance with meds.  No CP/SOB/LE edema.  Goes to HD Tue/Thur/Sat.   HM:  Reports getting flu shot in Sept at HD.    Patient Active Problem List   Diagnosis Date Noted  . 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 GERD (gastroesophageal reflux disease) 01/17/2016  . 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 Post-menopausal bleeding 04/06/2012  . End stage renal disease on dialysis ([place]) 04/06/2012  . Hypertension 04/06/2012     Current Outpatient Medications on File Prior to Visit  Medication Sig Dispense Refill  . acetaminophen (TYLENOL) 325 MG tablet Take 650 mg by mouth every 6 (six) hours as needed for mild pain.     . calcium acetate (PHOSLO) 667 MG capsule Take 667 mg by mouth 3 (three) times daily with meals.    . calcium carbonate (TUMS - DOSED IN MG ELEMENTAL CALCIUM) 500 MG chewable tablet Chew 2 tablets by mouth daily as needed for indigestion or heartburn.    Marland Kitchen ketoconazole (NIZORAL) 2 % cream Apply to both feet and in between toes once daily. 60 g 0  . omeprazole (PRILOSEC) 20 MG capsule Take 1 capsule (20 mg total) by mouth daily. 1 PO 30 mins prior to breakfast and supper 30  capsule 3  . triamcinolone cream (KENALOG) 0.1 % Apply 1 application topically 2 (two) times daily. 45 g 1  . Vitamin D, Ergocalciferol, (DRISDOL) 50000 units CAPS capsule Take 1 capsule (50,000 Units total) by mouth every 7 (seven) days. (Patient taking differently: Take 50,000 Units by mouth every Tuesday. ) 12 capsule 0   No current facility-administered medications on file prior to visit.     Allergies  Allergen Reactions  . No Known Allergies   . Trazodone And Nefazodone     Makes her shake    Social History   Socioeconomic History  . Marital status: Significant Other    Spouse name: Not on file  . Number of children: 2  . Years of education: Not on file  . Highest education level: Not on file  Occupational History  . Not on file  Social Needs  . Financial resource strain: Not on file  . Food insecurity:    Worry: Not on file    Inability: Not on file  . Transportation needs:    Medical: Not on file    Non-medical: Not on file  Tobacco Use  . Smoking status: Never Smoker  . Smokeless tobacco: Never Used  Substance and Sexual Activity  . Alcohol use: No  . Drug use: No  . Sexual activity: Not Currently    Birth control/protection: Surgical  Lifestyle  . Physical activity:    Days per week:  Not on file    Minutes per session: Not on file  . Stress: Not on file  Relationships  . Social connections:    Talks on phone: Not on file    Gets together: Not on file    Attends religious service: Not on file    Active member of club or organization: Not on file    Attends meetings of clubs or organizations: Not on file    Relationship status: Not on file  . Intimate partner violence:    Fear of current or ex partner: Not on file    Emotionally abused: Not on file    Physically abused: Not on file    Forced sexual activity: Not on file  Other Topics Concern  . Not on file  Social History Narrative  . Not on file    Family History  Problem Relation Age of Onset   . Hypertension Mother   . Hypertension Father   . Hypertension Sister   . Breast cancer Sister 35  . Diabetes Brother   . Hypertension Brother     Past Surgical History:  Procedure Laterality Date  . AV FISTULA PLACEMENT Left 07/05/09   Dr. Kellie Simmering  . COLONOSCOPY N/A 02/10/2016   SLF: 1. HEME postive stool due to colon polyps intrernal hemorrhoids 2. small internal hemrrohoids 3. moderate external hemorrhoids.   . ESOPHAGEAL DILATION N/A 02/10/2016   Procedure: ESOPHAGEAL DILATION;  Surgeon: Danie Binder, MD;  [facility];  Service: Endoscopy;  Laterality: N/A;  . ESOPHAGOGASTRODUODENOSCOPY N/A 02/10/2016   SLF: patent Schatzki's ring , mild gastritis/ duodentitis.   Marland Kitchen FISTULOGRAM N/A 10/22/2014   Procedure: FISTULOGRAM;  Surgeon: Angelia Mould, MD;  Location: [facility] CATH LAB;  Service: Cardiovascular;  Laterality: N/A;  . PERIPHERAL VASCULAR CATHETERIZATION Left 12/09/2016   Procedure: A/V Fistulagram;  Surgeon: Waynetta Sandy, MD;  Location: [place] CV LAB;  Service: Cardiovascular;  Laterality: Left;  . REVISON OF ARTERIOVENOUS FISTULA Left 35/00/9381   Procedure: PLICATION OF BRACHIOCEPHALIC FISTULA;  Surgeon: Conrad Magnolia, MD;  Location: [place];  Service: Vascular;  Laterality: Left;  . REVISON OF ARTERIOVENOUS FISTULA Left 08/09/2017   Procedure: REVISION OF ARTERIOVENOUS FISTULA LIGATION OF SIDE BRANCH;  Surgeon: Waynetta Sandy, MD;  Location: [place];  Service: Vascular;  Laterality: Left;  . TUBAL LIGATION  2004    ROS: Review of Systems Neg except as above  PHYSICAL EXAM: BP 130/68   Pulse 65   Temp 98.5 F (36.9 C) (Oral)   Resp 16   Wt 137 lb (62.1 kg)   LMP 02/11/2012 Comment: tubal ligation  SpO2 99%   BMI 26.76 kg/m   Physical Exam  General appearance - alert, well appearing, and in no distress Mental status - normal mood, behavior, speech, dress, motor activity, and thought processes Nose - normal and patent, no  erythema, discharge or polyps Mouth - mucous membranes moist, pharynx normal without lesions Neck - supple, no significant adenopathy Chest - clear to auscultation, no wheezes, rales or rhonchi, symmetric air entry Heart -regular rate rhythm, 2/6  systolic ejection murmur along the left sternal border  extremities - peripheral pulses normal, no pedal edema, no clubbing or cyanosis Dialysis graft LUE - + hum  ASSESSMENT AND PLAN: 1. Essential hypertension At goal.  Continue current medications - amLODipine (NORVASC) 10 MG tablet; Take 1 tablet (10 mg total) by mouth at bedtime.  Dispense: 90 tablet; Refill: 3 - carvedilol (COREG) 6.25 MG tablet;  Take 1 tablet (6.25 mg total) by mouth at bedtime.  Dispense: 180 tablet; Refill: 3  2. Calcaneal spur of right foot I have resubmitted referral to podiatry.  I will have our referral coordinator meet with her today to try schedule the appointment for her  3. Insomnia, unspecified type Patient requested refill on hydroxyzine - hydrOXYzine (ATARAX/VISTARIL) 25 MG tablet; Take 0.5 tablets (12.5 mg total) by mouth at bedtime as needed (insomnia and itching).  Dispense: 30 tablet; Refill: 1  4. Itching - hydrOXYzine (ATARAX/VISTARIL) 25 MG tablet; Take 0.5 tablets (12.5 mg total) by mouth at bedtime as needed (insomnia and itching).  Dispense: 30 tablet; Refill: 1  Patient was given the opportunity to ask questions.  Patient verbalized understanding of the plan and was able to repeat key elements of the plan.  [employer] interpreter used during this encounter.   Orders Placed This Encounter  Procedures  . Ambulatory referral to Podiatry     Requested Prescriptions   Signed Prescriptions Disp Refills  . ethyl chloride spray 103.5 mL 1    Sig: Apply 1 application topically as needed (for dialysis).  . hydrOXYzine (ATARAX/VISTARIL) 25 MG tablet 30 tablet 1    Sig: Take 0.5 tablets (12.5 mg total) by mouth at bedtime as needed (insomnia and  itching).  Marland Kitchen amLODipine (NORVASC) 10 MG tablet 90 tablet 3    Sig: Take 1 tablet (10 mg total) by mouth at bedtime.  . carvedilol (COREG) 6.25 MG tablet 180 tablet 3    Sig: Take 1 tablet (6.25 mg total) by mouth at bedtime.    Return in about 3 months (around 01/10/2019).  Karle Plumber, MD, FACP

## 2018-10-24 ENCOUNTER — Ambulatory Visit: Payer: Self-pay | Admitting: Podiatry

## 2018-11-01 ENCOUNTER — Telehealth: Payer: Self-pay | Admitting: Vascular Surgery

## 2018-11-01 NOTE — Telephone Encounter (Signed)
sch appt spk to pt left daughte vm 11/02/18 915am f/u MD

## 2018-11-01 NOTE — Telephone Encounter (Signed)
-----   Message from Marty Heck, MD sent at 10/27/2018  2:13 PM EDT ----- Can we get her a next available appointment in clinic?  She has two thin segments over her fistula that need to be evaluated.  No active bleeding and they are sticking away from thin areas and it is working.  Thanks,  Gerald Stabs

## 2018-11-02 ENCOUNTER — Other Ambulatory Visit: Payer: Self-pay

## 2018-11-02 ENCOUNTER — Other Ambulatory Visit: Payer: Self-pay | Admitting: *Deleted

## 2018-11-02 ENCOUNTER — Encounter: Payer: Self-pay | Admitting: Vascular Surgery

## 2018-11-02 ENCOUNTER — Ambulatory Visit (INDEPENDENT_AMBULATORY_CARE_PROVIDER_SITE_OTHER): Payer: Self-pay | Admitting: Vascular Surgery

## 2018-11-02 ENCOUNTER — Encounter: Payer: Self-pay | Admitting: *Deleted

## 2018-11-02 VITALS — BP 113/74 | HR 66 | Temp 97.9°F | Resp 16 | Ht 60.0 in | Wt 134.0 lb

## 2018-11-02 DIAGNOSIS — Z992 Dependence on renal dialysis: Secondary | ICD-10-CM

## 2018-11-02 DIAGNOSIS — N186 End stage renal disease: Secondary | ICD-10-CM

## 2018-11-02 NOTE — Progress Notes (Signed)
Patient name: Sabrina Mitchell MRN: [id number] DOB: 1961-12-12 Sex: female  REASON FOR VISIT:   Pseudoaneurysms of AV fistula with compromised skin.  HPI:   Sabrina Mitchell is a pleasant 57 y.o. female who had developed 2 thin segments over her fistula that needed to be evaluated.  She has not had any active bleeding and they were sticking away from these areas but she was set up for an office visit.  The history is obtained through the the translator.  Patient does not speak [place].  She complains of pain with cannulation of her fistula.  She is tried the cream which does not seem to help much.  She also has a lot of itching and scratches her arm a lot.  She is here today because she has not aneurysmal central portion of her fistula with 2 skin erosions.  One is fairly significant.  She dialyzes on Tuesdays Thursdays and Saturdays.  She is had no recent uremic symptoms.  Past Medical History:  Diagnosis Date  . Anemia   . Anxiety   . Complication of anesthesia   . Depression   . End stage renal disease ([place])    hemodialysis 3x/wk @ [address]  . Family history of adverse reaction to anesthesia    sister vomiting after uterine cyst removal  . Gastritis   . GERD (gastroesophageal reflux disease)   . Hemorrhoids   . Hypertension   . Pneumonia    years ago  . PONV (postoperative nausea and vomiting)   . Renal disorder   . UTI (lower urinary tract infection) March 2014    Family History  Problem Relation Age of Onset  . Hypertension Mother   . Hypertension Father   . Hypertension Sister   . Breast cancer Sister 36  . Diabetes Brother   . Hypertension Brother     SOCIAL HISTORY: Social History   Tobacco Use  . Smoking status: Never Smoker  . Smokeless tobacco: Never Used  Substance Use Topics  . Alcohol use: No    Allergies  Allergen Reactions  . Trazodone And Nefazodone     Makes her shake    Current Outpatient Medications    Medication Sig Dispense Refill  . acetaminophen (TYLENOL) 325 MG tablet Take 650 mg by mouth every 6 (six) hours as needed for mild pain.     Marland Kitchen amLODipine (NORVASC) 10 MG tablet Take 1 tablet (10 mg total) by mouth at bedtime. 90 tablet 3  . calcium acetate (PHOSLO) 667 MG capsule Take 667 mg by mouth 3 (three) times daily with meals.    . calcium carbonate (TUMS - DOSED IN MG ELEMENTAL CALCIUM) 500 MG chewable tablet Chew 2 tablets by mouth daily as needed for indigestion or heartburn.    . carvedilol (COREG) 6.25 MG tablet Take 1 tablet (6.25 mg total) by mouth at bedtime. 180 tablet 3  . ethyl chloride spray Apply 1 application topically as needed (for dialysis). 103.5 mL 1  . hydrOXYzine (ATARAX/VISTARIL) 25 MG tablet Take 0.5 tablets (12.5 mg total) by mouth at bedtime as needed (insomnia and itching). 30 tablet 1  . ketoconazole (NIZORAL) 2 % cream Apply to both feet and in between toes once daily. 60 g 0  . omeprazole (PRILOSEC) 20 MG capsule Take 1 capsule (20 mg total) by mouth daily. 1 PO 30 mins prior to breakfast and supper 30 capsule 3  . triamcinolone cream (KENALOG) 0.1 % Apply 1 application topically 2 (two) times  daily. 45 g 1  . Vitamin D, Ergocalciferol, (DRISDOL) 50000 units CAPS capsule Take 1 capsule (50,000 Units total) by mouth every 7 (seven) days. (Patient taking differently: Take 50,000 Units by mouth every Tuesday. ) 12 capsule 0   No current facility-administered medications for this visit.     REVIEW OF SYSTEMS:  [X]  denotes positive finding, [ ]  denotes negative finding Cardiac  Comments:  Chest pain or chest pressure:    Shortness of breath upon exertion:    Short of breath when lying flat:    Irregular heart rhythm:        Vascular    Pain in calf, thigh, or hip brought on by ambulation: x   Pain in feet at night that wakes you up from your sleep:     Blood clot in your veins:    Leg swelling:         Pulmonary    Oxygen at home:    Productive cough:      Wheezing:         Neurologic    Sudden weakness in arms or legs:     Sudden numbness in arms or legs:     Sudden onset of difficulty speaking or slurred speech:    Temporary loss of vision in one eye:     Problems with dizziness:         Gastrointestinal    Blood in stool:     Vomited blood:         Genitourinary    Burning when urinating:     Blood in urine:        Psychiatric    Major depression:         Hematologic    Bleeding problems:    Problems with blood clotting too easily:        Skin    Rashes or ulcers:        Constitutional    Fever or chills:     PHYSICAL EXAM:   Vitals:   11/02/18 0912  Weight: 134 lb (60.8 kg)    GENERAL: The patient is a well-nourished female, in no acute distress. The vital signs are documented above. CARDIAC: There is a regular rate and rhythm.  VASCULAR: I cannot palpate a left radial pulse.  She does have a monophasic radial and ulnar signal with the Doppler. She has a good thrill in her left upper arm fistula which is markedly aneurysmal especially in the central portion. PULMONARY: There is good air exchange bilaterally without wheezing or rales. ABDOMEN: Soft and non-tender with normal pitched bowel sounds.  MUSCULOSKELETAL: There are no major deformities or cyanosis. NEUROLOGIC: No focal weakness or paresthesias are detected. SKIN: There are 2 skin erosions overlying this aneurysm and the more central one is fairly large about 2 cm x 1 cm. PSYCHIATRIC: The patient has a normal affect.  DATA:    No new labs  MEDICAL ISSUES:   END-STAGE RENAL DISEASE: This patient has an aneurysmal left upper arm fistula which she has had for 8 or 9 years.  The more central portion is especially aneurysmal and she has 2 skin erosions over this area.  I have recommended plication of the aneurysm in this location to address the skin erosions and I think there is still room to cannulate the fistula above and below this area.  We have  discussed the procedure and potential risks.  We discussed the alternative of ligating her fistula and placing new access  with a catheter however I have encouraged her not to do this so that we can get as much time as possible from her current AV fistula.  She is agreeable with this plan.  Her surgeries been scheduled for Friday, 11/04/2018.  A total of 45 minutes was spent on this visit. 30 minutes was face to face time. More than 50% of the time was spent on counseling and coordinating with the patient.    Deitra Mayo [facility] [phone]

## 2018-11-02 NOTE — H&P (View-Only) (Signed)
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  HPI:   Sabrina Mitchell is a pleasant 57 y.o. female who had developed 2 thin segments over her fistula that needed to be evaluated.  She has not had any active bleeding and they were sticking away from these areas but she was set up for an office visit.  The history is obtained through the the translator.  Patient does not speak [place].  She complains of pain with cannulation of her fistula.  She is tried the cream which does not seem to help much.  She also has a lot of itching and scratches her arm a lot.  She is here today because she has not aneurysmal central portion of her fistula with 2 skin erosions.  One is fairly significant.  She dialyzes on Tuesdays Thursdays and Saturdays.  She is had no recent uremic symptoms.  Past Medical History:  Diagnosis Date  . Anemia   . Anxiety   . Complication of anesthesia   . Depression   . End stage renal disease ([place])    hemodialysis 3x/wk @ [address]  . Family history of adverse reaction to anesthesia    sister vomiting after uterine cyst removal  . Gastritis   . GERD (gastroesophageal reflux disease)   . Hemorrhoids   . Hypertension   . Pneumonia    years ago  . PONV (postoperative nausea and vomiting)   . Renal disorder   . UTI (lower urinary tract infection) March 2014    Family History  Problem Relation Age of Onset  . Hypertension Mother   . Hypertension Father   . Hypertension Sister   . Breast cancer Sister 36  . Diabetes Brother   . Hypertension Brother     SOCIAL HISTORY: Social History   Tobacco Use  . Smoking status: Never Smoker  . Smokeless tobacco: Never Used  Substance Use Topics  . Alcohol use: No    Allergies  Allergen Reactions  . Trazodone And Nefazodone     Makes her shake    Current Outpatient Medications    Medication Sig Dispense Refill  . acetaminophen (TYLENOL) 325 MG tablet Take 650 mg by mouth every 6 (six) hours as needed for mild pain.     Marland Kitchen amLODipine (NORVASC) 10 MG tablet Take 1 tablet (10 mg total) by mouth at bedtime. 90 tablet 3  . calcium acetate (PHOSLO) 667 MG capsule Take 667 mg by mouth 3 (three) times daily with meals.    . calcium carbonate (TUMS - DOSED IN MG ELEMENTAL CALCIUM) 500 MG chewable tablet Chew 2 tablets by mouth daily as needed for indigestion or heartburn.    . carvedilol (COREG) 6.25 MG tablet Take 1 tablet (6.25 mg total) by mouth at bedtime. 180 tablet 3  . ethyl chloride spray Apply 1 application topically as needed (for dialysis). 103.5 mL 1  . hydrOXYzine (ATARAX/VISTARIL) 25 MG tablet Take 0.5 tablets (12.5 mg total) by mouth at bedtime as needed (insomnia and itching). 30 tablet 1  . ketoconazole (NIZORAL) 2 % cream Apply to both feet and in between toes once daily. 60 g 0  . omeprazole (PRILOSEC) 20 MG capsule Take 1 capsule (20 mg total) by mouth daily. 1 PO 30 mins prior to breakfast and supper 30 capsule 3  . triamcinolone cream (KENALOG) 0.1 % Apply 1 application topically 2 (two) times  daily. 45 g 1  . Vitamin D, Ergocalciferol, (DRISDOL) 50000 units CAPS capsule Take 1 capsule (50,000 Units total) by mouth every 7 (seven) days. (Patient taking differently: Take 50,000 Units by mouth every Tuesday. ) 12 capsule 0   No current facility-administered medications for this visit.     REVIEW OF SYSTEMS:  [X]  denotes positive finding, [ ]  denotes negative finding Cardiac  Comments:  Chest pain or chest pressure:    Shortness of breath upon exertion:    Short of breath when lying flat:    Irregular heart rhythm:        Vascular    Pain in calf, thigh, or hip brought on by ambulation: x   Pain in feet at night that wakes you up from your sleep:     Blood clot in your veins:    Leg swelling:         Pulmonary    Oxygen at home:    Productive cough:      Wheezing:         Neurologic    Sudden weakness in arms or legs:     Sudden numbness in arms or legs:     Sudden onset of difficulty speaking or slurred speech:    Temporary loss of vision in one eye:     Problems with dizziness:         Gastrointestinal    Blood in stool:     Vomited blood:         Genitourinary    Burning when urinating:     Blood in urine:        Psychiatric    Major depression:         Hematologic    Bleeding problems:    Problems with blood clotting too easily:        Skin    Rashes or ulcers:        Constitutional    Fever or chills:     PHYSICAL EXAM:   Vitals:   11/02/18 0912  Weight: 134 lb (60.8 kg)    GENERAL: The patient is a well-nourished female, in no acute distress. The vital signs are documented above. CARDIAC: There is a regular rate and rhythm.  VASCULAR: I cannot palpate a left radial pulse.  She does have a monophasic radial and ulnar signal with the Doppler. She has a good thrill in her left upper arm fistula which is markedly aneurysmal especially in the central portion. PULMONARY: There is good air exchange bilaterally without wheezing or rales. ABDOMEN: Soft and non-tender with normal pitched bowel sounds.  MUSCULOSKELETAL: There are no major deformities or cyanosis. NEUROLOGIC: No focal weakness or paresthesias are detected. SKIN: There are 2 skin erosions overlying this aneurysm and the more central one is fairly large about 2 cm x 1 cm. PSYCHIATRIC: The patient has a normal affect.  DATA:    No new labs  MEDICAL ISSUES:   END-STAGE RENAL DISEASE: This patient has an aneurysmal left upper arm fistula which she has had for 8 or 9 years.  The more central portion is especially aneurysmal and she has 2 skin erosions over this area.  I have recommended plication of the aneurysm in this location to address the skin erosions and I think there is still room to cannulate the fistula above and below this area.  We have  discussed the procedure and potential risks.  We discussed the alternative of ligating her fistula and placing new access  with a catheter however I have encouraged her not to do this so that we can get as much time as possible from her current AV fistula.  She is agreeable with this plan.  Her surgeries been scheduled for Friday, 11/04/2018.  A total of 45 minutes was spent on this visit. 30 minutes was face to face time. More than 50% of the time was spent on counseling and coordinating with the patient.    Deitra Mayo [facility] [phone]

## 2018-11-03 ENCOUNTER — Encounter (HOSPITAL_COMMUNITY): Payer: Self-pay | Admitting: *Deleted

## 2018-11-03 ENCOUNTER — Other Ambulatory Visit: Payer: Self-pay

## 2018-11-03 NOTE — Anesthesia Preprocedure Evaluation (Addendum)
Anesthesia Evaluation  Patient identified by MRN, date of birth, ID band Patient awake    Reviewed: Allergy & Precautions, NPO status , Patient's Chart, lab work & pertinent test results  History of Anesthesia Complications (+) PONV  Airway Mallampati: II  TM Distance: >3 FB Neck ROM: Full    Dental  (+) Edentulous Upper, Edentulous Lower, Lower Dentures, Upper Dentures   Pulmonary neg pulmonary ROS,    breath sounds clear to auscultation       Cardiovascular hypertension, Pt. on medications and Pt. on home beta blockers (-) angina Rhythm:Regular Rate:Normal     Neuro/Psych Anxiety Depression negative neurological ROS     GI/Hepatic Neg liver ROS, GERD  Poorly Controlled,  Endo/Other  negative endocrine ROS  Renal/GU Dialysis and ESRFRenal disease (K+ 3.1)     Musculoskeletal   Abdominal   Peds  Hematology  (+) Blood dyscrasia (Hb 10.9), anemia ,   Anesthesia Other Findings   Reproductive/Obstetrics                            Anesthesia Physical Anesthesia Plan  ASA: III  Anesthesia Plan: MAC   Post-op Pain Management:    Induction:   PONV Risk Score and Plan: 3 and Ondansetron, Diphenhydramine, Dexamethasone and Treatment may vary due to age or medical condition  Airway Management Planned: Natural Airway and Nasal Cannula  Additional Equipment:   Intra-op Plan:   Post-operative Plan:   Informed Consent: I have reviewed the patients History and Physical, chart, labs and discussed the procedure including the risks, benefits and alternatives for the proposed anesthesia with the patient or authorized representative who has indicated his/her understanding and acceptance.   Dental advisory given  Plan Discussed with: CRNA and Surgeon  Anesthesia Plan Comments:        Anesthesia Quick Evaluation

## 2018-11-04 ENCOUNTER — Ambulatory Visit (HOSPITAL_COMMUNITY)
Admission: RE | Admit: 2018-11-04 | Discharge: 2018-11-04 | Disposition: A | Discharge status: Home / Self Care | Payer: Self-pay | Source: Ambulatory Visit | Attending: Vascular Surgery | Admitting: Vascular Surgery

## 2018-11-04 ENCOUNTER — Encounter (HOSPITAL_COMMUNITY)
Admission: RE | Disposition: A | Discharge status: Home / Self Care | Payer: Self-pay | Source: Ambulatory Visit | Attending: Vascular Surgery

## 2018-11-04 ENCOUNTER — Ambulatory Visit (HOSPITAL_COMMUNITY): Payer: Self-pay | Admitting: Anesthesiology

## 2018-11-04 DIAGNOSIS — K219 Gastro-esophageal reflux disease without esophagitis: Secondary | ICD-10-CM | POA: Insufficient documentation

## 2018-11-04 DIAGNOSIS — N186 End stage renal disease: Secondary | ICD-10-CM

## 2018-11-04 DIAGNOSIS — Z992 Dependence on renal dialysis: Secondary | ICD-10-CM

## 2018-11-04 DIAGNOSIS — I12 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 disease: Secondary | ICD-10-CM | POA: Insufficient documentation

## 2018-11-04 DIAGNOSIS — T82898A Other specified complication of vascular prosthetic devices, implants and grafts, initial encounter: Secondary | ICD-10-CM

## 2018-11-04 DIAGNOSIS — I721 Aneurysm of artery of upper extremity: Secondary | ICD-10-CM | POA: Insufficient documentation

## 2018-11-04 DIAGNOSIS — Z79899 Other long term (current) drug therapy: Secondary | ICD-10-CM | POA: Insufficient documentation

## 2018-11-04 DIAGNOSIS — Z888 Allergy status to other drugs, medicaments and biological substances status: Secondary | ICD-10-CM | POA: Insufficient documentation

## 2018-11-04 DIAGNOSIS — Z8249 Family history of ischemic heart disease and other diseases of the circulatory system: Secondary | ICD-10-CM | POA: Insufficient documentation

## 2018-11-04 HISTORY — PX: FISTULA SUPERFICIALIZATION: SHX6341

## 2018-11-04 LAB — POCT I-STAT 4, (NA,K, GLUC, HGB,HCT)
GLUCOSE: 95 mg/dL (ref 70–99)
HEMATOCRIT: 32 % — AB (ref 36.0–46.0)
Hemoglobin: 10.9 g/dL — ABNORMAL LOW (ref 12.0–15.0)
Potassium: 3.1 mmol/L — ABNORMAL LOW (ref 3.5–5.1)
Sodium: 136 mmol/L (ref 135–145)

## 2018-11-04 SURGERY — FISTULA SUPERFICIALIZATION
Anesthesia: Monitor Anesthesia Care | Site: Arm Upper | Laterality: Left

## 2018-11-04 MED ORDER — MIDAZOLAM HCL 5 MG/5ML IJ SOLN
INTRAMUSCULAR | Status: DC | PRN
  Administered 2018-11-04: 2 mg via INTRAVENOUS

## 2018-11-04 MED ORDER — FENTANYL CITRATE (PF) 250 MCG/5ML IJ SOLN
INTRAMUSCULAR | Status: DC | PRN
  Administered 2018-11-04 (×5): 25 ug via INTRAVENOUS

## 2018-11-04 MED ORDER — SODIUM CHLORIDE 0.9 % IV SOLN
INTRAVENOUS | Status: DC
  Administered 2018-11-04 (×2): via INTRAVENOUS

## 2018-11-04 MED ORDER — EPHEDRINE SULFATE 50 MG/ML IJ SOLN
INTRAMUSCULAR | Status: DC | PRN
  Administered 2018-11-04 (×3): 5 mg via INTRAVENOUS

## 2018-11-04 MED ORDER — PROMETHAZINE HCL 25 MG/ML IJ SOLN
6.2500 mg | INTRAMUSCULAR | Status: DC | PRN
  Administered 2018-11-04: 6.25 mg via INTRAVENOUS

## 2018-11-04 MED ORDER — HEPARIN SODIUM (PORCINE) 1000 UNIT/ML IJ SOLN
INTRAMUSCULAR | Status: AC
  Filled 2018-11-04: qty 1

## 2018-11-04 MED ORDER — SODIUM CHLORIDE 0.9 % IV SOLN
INTRAVENOUS | Status: DC | PRN
  Administered 2018-11-04: 15 ug/min via INTRAVENOUS

## 2018-11-04 MED ORDER — LIDOCAINE-EPINEPHRINE (PF) 1 %-1:200000 IJ SOLN
INTRAMUSCULAR | Status: AC
  Filled 2018-11-04: qty 30

## 2018-11-04 MED ORDER — MIDAZOLAM HCL 2 MG/2ML IJ SOLN: 0.5000 mg | Freq: Once | INTRAMUSCULAR | Status: DC | PRN

## 2018-11-04 MED ORDER — 0.9 % SODIUM CHLORIDE (POUR BTL) OPTIME
TOPICAL | Status: DC | PRN
  Administered 2018-11-04: 1000 mL

## 2018-11-04 MED ORDER — FENTANYL CITRATE (PF) 100 MCG/2ML IJ SOLN
25.0000 ug | INTRAMUSCULAR | Status: DC | PRN
  Administered 2018-11-04 (×2): 50 ug via INTRAVENOUS

## 2018-11-04 MED ORDER — PROTAMINE SULFATE 10 MG/ML IV SOLN
INTRAVENOUS | Status: DC | PRN
  Administered 2018-11-04: 30 mg via INTRAVENOUS

## 2018-11-04 MED ORDER — PROMETHAZINE HCL 25 MG/ML IJ SOLN
INTRAMUSCULAR | Status: AC
  Filled 2018-11-04: qty 1

## 2018-11-04 MED ORDER — LIDOCAINE HCL (PF) 1 % IJ SOLN
INTRAMUSCULAR | Status: DC | PRN
  Administered 2018-11-04: 30 mL

## 2018-11-04 MED ORDER — FENTANYL CITRATE (PF) 100 MCG/2ML IJ SOLN
INTRAMUSCULAR | Status: AC
  Filled 2018-11-04: qty 2

## 2018-11-04 MED ORDER — PHENYLEPHRINE HCL 10 MG/ML IJ SOLN
INTRAMUSCULAR | Status: DC | PRN
  Administered 2018-11-04: 40 ug via INTRAVENOUS

## 2018-11-04 MED ORDER — SODIUM CHLORIDE 0.9 % IV SOLN
INTRAVENOUS | Status: AC
  Filled 2018-11-04: qty 1.2

## 2018-11-04 MED ORDER — MEPERIDINE HCL 50 MG/ML IJ SOLN: 6.2500 mg | INTRAMUSCULAR | Status: DC | PRN

## 2018-11-04 MED ORDER — FENTANYL CITRATE (PF) 250 MCG/5ML IJ SOLN
INTRAMUSCULAR | Status: AC
  Filled 2018-11-04: qty 5

## 2018-11-04 MED ORDER — PROPOFOL 500 MG/50ML IV EMUL
INTRAVENOUS | Status: DC | PRN
  Administered 2018-11-04: 25 ug/kg/min via INTRAVENOUS

## 2018-11-04 MED ORDER — HEPARIN SODIUM (PORCINE) 1000 UNIT/ML IJ SOLN
INTRAMUSCULAR | Status: DC | PRN
  Administered 2018-11-04: 6000 [IU] via INTRAVENOUS

## 2018-11-04 MED ORDER — OXYCODONE HCL 5 MG PO TABS
5.0000 mg | ORAL_TABLET | Freq: Once | ORAL | Status: AC
  Administered 2018-11-04: 5 mg via ORAL

## 2018-11-04 MED ORDER — OXYCODONE HCL 5 MG PO TABS: 5.0000 mg | ORAL_TABLET | ORAL | 0 refills | Status: DC | PRN

## 2018-11-04 MED ORDER — MIDAZOLAM HCL 2 MG/2ML IJ SOLN
INTRAMUSCULAR | Status: AC
  Filled 2018-11-04: qty 2

## 2018-11-04 MED ORDER — SODIUM CHLORIDE 0.9 % IV SOLN
INTRAVENOUS | Status: DC | PRN
  Administered 2018-11-04: 07:00:00

## 2018-11-04 MED ORDER — DIPHENHYDRAMINE HCL 50 MG/ML IJ SOLN
INTRAMUSCULAR | Status: DC | PRN
  Administered 2018-11-04: 6.25 mg via INTRAVENOUS

## 2018-11-04 MED ORDER — CEFAZOLIN SODIUM-DEXTROSE 2-4 GM/100ML-% IV SOLN
2.0000 g | INTRAVENOUS | Status: AC
  Administered 2018-11-04: 2 g via INTRAVENOUS
  Filled 2018-11-04: qty 100

## 2018-11-04 MED ORDER — LIDOCAINE-EPINEPHRINE (PF) 1 %-1:200000 IJ SOLN
INTRAMUSCULAR | Status: DC | PRN
  Administered 2018-11-04: 30 mL

## 2018-11-04 MED ORDER — ONDANSETRON HCL 4 MG/2ML IJ SOLN
INTRAMUSCULAR | Status: DC | PRN
  Administered 2018-11-04: 4 mg via INTRAVENOUS

## 2018-11-04 MED ORDER — DIPHENHYDRAMINE HCL 50 MG/ML IJ SOLN
INTRAMUSCULAR | Status: AC
  Filled 2018-11-04: qty 1

## 2018-11-04 MED ORDER — PROPOFOL 10 MG/ML IV BOLUS
INTRAVENOUS | Status: DC | PRN
  Administered 2018-11-04 (×3): 20 mg via INTRAVENOUS
  Administered 2018-11-04 (×2): 10 mg via INTRAVENOUS

## 2018-11-04 MED ORDER — LIDOCAINE HCL (PF) 1 % IJ SOLN
INTRAMUSCULAR | Status: AC
  Filled 2018-11-04: qty 30

## 2018-11-04 MED ORDER — OXYCODONE HCL 5 MG PO TABS
ORAL_TABLET | ORAL | Status: AC
  Filled 2018-11-04: qty 1

## 2018-11-04 MED ORDER — LIDOCAINE 2% (20 MG/ML) 5 ML SYRINGE
INTRAMUSCULAR | Status: AC
  Filled 2018-11-04: qty 5

## 2018-11-04 SURGICAL SUPPLY — 40 items
ADH SKN CLS APL DERMABOND .7 (GAUZE/BANDAGES/DRESSINGS) ×1
ARMBAND PINK RESTRICT EXTREMIT (MISCELLANEOUS) ×3 IMPLANT
CANISTER SUCT 3000ML PPV (MISCELLANEOUS) ×3 IMPLANT
CANNULA VESSEL 3MM 2 BLNT TIP (CANNULA) ×3 IMPLANT
CLIP VESOCCLUDE MED 6/CT (CLIP) ×3 IMPLANT
CLIP VESOCCLUDE SM WIDE 6/CT (CLIP) ×3 IMPLANT
COVER PROBE W GEL 5X96 (DRAPES) ×3 IMPLANT
COVER WAND RF STERILE (DRAPES) ×3 IMPLANT
DECANTER SPIKE VIAL GLASS SM (MISCELLANEOUS) ×3 IMPLANT
DERMABOND ADVANCED (GAUZE/BANDAGES/DRESSINGS) ×2
DERMABOND ADVANCED .7 DNX12 (GAUZE/BANDAGES/DRESSINGS) ×1 IMPLANT
ELECT REM PT RETURN 9FT ADLT (ELECTROSURGICAL) ×3
ELECTRODE REM PT RTRN 9FT ADLT (ELECTROSURGICAL) ×1 IMPLANT
GLOVE BIO SURGEON STRL SZ7.5 (GLOVE) ×5 IMPLANT
GLOVE BIOGEL PI IND STRL 6.5 (GLOVE) IMPLANT
GLOVE BIOGEL PI IND STRL 7.0 (GLOVE) IMPLANT
GLOVE BIOGEL PI IND STRL 7.5 (GLOVE) ×1 IMPLANT
GLOVE BIOGEL PI IND STRL 8 (GLOVE) ×1 IMPLANT
GLOVE BIOGEL PI INDICATOR 6.5 (GLOVE) ×4
GLOVE BIOGEL PI INDICATOR 7.0 (GLOVE) ×2
GLOVE BIOGEL PI INDICATOR 7.5 (GLOVE) ×2
GLOVE BIOGEL PI INDICATOR 8 (GLOVE) ×2
GLOVE ECLIPSE 7.0 STRL STRAW (GLOVE) ×2 IMPLANT
GLOVE SURG SS PI 6.5 STRL IVOR (GLOVE) ×3 IMPLANT
GOWN STRL REUS W/ TWL LRG LVL3 (GOWN DISPOSABLE) ×3 IMPLANT
GOWN STRL REUS W/TWL LRG LVL3 (GOWN DISPOSABLE) ×9
KIT BASIN OR (CUSTOM PROCEDURE TRAY) ×3 IMPLANT
KIT TURNOVER KIT B (KITS) ×3 IMPLANT
NS IRRIG 1000ML POUR BTL (IV SOLUTION) ×3 IMPLANT
PACK CV ACCESS (CUSTOM PROCEDURE TRAY) ×3 IMPLANT
PAD ARMBOARD 7.5X6 YLW CONV (MISCELLANEOUS) ×6 IMPLANT
SPONGE SURGIFOAM ABS GEL 100 (HEMOSTASIS) IMPLANT
SUT PROLENE 5 0 C 1 24 (SUTURE) ×4 IMPLANT
SUT PROLENE 6 0 BV (SUTURE) ×3 IMPLANT
SUT VIC AB 3-0 SH 27 (SUTURE) ×3
SUT VIC AB 3-0 SH 27X BRD (SUTURE) ×1 IMPLANT
SUT VICRYL 4-0 PS2 18IN ABS (SUTURE) ×3 IMPLANT
TOWEL GREEN STERILE (TOWEL DISPOSABLE) ×3 IMPLANT
UNDERPAD 30X30 (UNDERPADS AND DIAPERS) ×3 IMPLANT
WATER STERILE IRR 1000ML POUR (IV SOLUTION) ×3 IMPLANT

## 2018-11-04 NOTE — Anesthesia Postprocedure Evaluation (Signed)
Anesthesia Post Note  Patient: Sabrina Mitchell  Procedure(s) Performed: PLICATION BRACHIOCEPHALIC FISTULA (Left Arm Upper)     Patient location during evaluation: PACU Anesthesia Type: MAC Level of consciousness: awake and alert, oriented and patient cooperative Pain management: pain level controlled Vital Signs Assessment: post-procedure vital signs reviewed and stable Respiratory status: spontaneous breathing, nonlabored ventilation and respiratory function stable Cardiovascular status: blood pressure returned to baseline and stable Postop Assessment: no apparent nausea or vomiting Anesthetic complications: no    Last Vitals:  Vitals:   11/04/18 0938 11/04/18 1028  BP:  (!) 156/85  Pulse: 75 64  Resp: (!) 23 18  Temp:    SpO2: 91% 98%    Last Pain:  Vitals:   11/04/18 1028  TempSrc:   PainSc: 0-No pain                 Koralynn Greenspan,E. Thi Klich

## 2018-11-04 NOTE — Transfer of Care (Signed)
Immediate Anesthesia Transfer of Care Note  Patient: Sabrina Mitchell  Procedure(s) Performed: PLICATION BRACHIOCEPHALIC FISTULA (Left Arm Upper)  Patient Location: PACU  Anesthesia Type:MAC  Level of Consciousness: drowsy, patient cooperative and responds to stimulation  Airway & Oxygen Therapy: Patient Spontanous Breathing and Patient connected to face mask oxygen  Post-op Assessment: Report given to RN, Post -op Vital signs reviewed and stable, Patient moving all extremities and Patient moving all extremities X 4  Post vital signs: Reviewed and stable  Last Vitals:  Vitals Value Taken Time  BP 112/59 11/04/2018  8:49 AM  Temp    Pulse 70 11/04/2018  8:50 AM  Resp 14 11/04/2018  8:50 AM  SpO2 95 % 11/04/2018  8:50 AM  Vitals shown include unvalidated device data.  Last Pain:  Vitals:   11/04/18 0612  TempSrc: Oral         Complications: No apparent anesthesia complications

## 2018-11-04 NOTE — Op Note (Signed)
    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 Scot Dock, MD, FACS  ASSIST: Laurence Slate, PA  ANESTHESIA: Local with sedation  EBL: Minimal  INDICATIONS:    [name] is a 57 y.o. female with a large aneurysmal left upper arm fistula and 2 ulcerations overlying a large central aneurysm.  FINDINGS:   Excellent thrill at the completion of the procedure.  TECHNIQUE:   The patient was brought to the operating room and sedated by anesthesia.  The left upper extremity was prepped and draped in usual sterile fashion.  The skin was anesthetized with 1% lidocaine and an elliptical incision was made encompassing the 2 wounds over the large aneurysm in the central portion of the left upper arm fistula.  The aneurysm was dissected free circumferentially and the patient was heparinized.  The fistula was clamped proximal and distal to the aneurysm.  A large ellipse of the aneurysm was excised along the lateral wall and this was sewn back with running 5-0 Prolene suture.  I then rolled the fistula slightly by placing tacking sutures to roll the suture line so this was posterior.  Hemostasis was obtained in the wound and the heparin was reversed with protamine.  The wound was then closed with a deep layer of 3-0 Vicryl and the skin closed with 4-0 Vicryl.  Dermabond was applied.  The patient tolerated the procedure well was transferred to the recovery room in stable condition.  All needle and sponge counts were correct.   Deitra Mayo, MD, FACS [facility]  DATE OF DICTATION:   11/04/2018

## 2018-11-04 NOTE — Interval H&P Note (Signed)
History and Physical Interval Note:  11/04/2018 7:00 AM  Sabrina Mitchell  has presented today for surgery, with the diagnosis of pseudoaneurysm left arm  The various methods of treatment have been discussed with the patient and family. After consideration of risks, benefits and other options for treatment, the patient has consented to  Procedure(s): PLICATION BRACHIOCEPHALIC FISTULA (Left) as a surgical intervention .  The patient's history has been reviewed, patient examined, no change in status, stable for surgery.  I have reviewed the patient's chart and labs.  Questions were answered to the patient's satisfaction.     Deitra Mayo

## 2018-11-04 NOTE — Discharge Instructions (Signed)

## 2018-11-04 NOTE — Anesthesia Procedure Notes (Signed)
Procedure Name: MAC Date/Time: 11/04/2018 7:15 AM Performed by: Jearld Pies, CRNA Oxygen Delivery Method: Simple face mask Preoxygenation: Pre-oxygenation with 100% oxygen

## 2018-11-05 ENCOUNTER — Encounter (HOSPITAL_COMMUNITY): Payer: Self-pay | Admitting: Vascular Surgery

## 2018-11-07 ENCOUNTER — Ambulatory Visit: Payer: Self-pay | Admitting: Podiatry

## 2018-11-16 ENCOUNTER — Telehealth: Payer: Self-pay | Admitting: Internal Medicine

## 2018-11-16 ENCOUNTER — Ambulatory Visit: Payer: Self-pay | Admitting: Podiatry

## 2018-11-16 NOTE — Telephone Encounter (Signed)
Pt was inform that the CAFA letter is ready and if she want to be mail she choose to be mail the latter

## 2018-11-16 NOTE — Telephone Encounter (Signed)
Patient came in the clinic to get a copy of her [place] letter. Patient was informed the last letter we have in the system is expired. . Please follow up.

## 2018-11-16 NOTE — Telephone Encounter (Signed)
Patient came in wanting to know

## 2018-12-12 ENCOUNTER — Ambulatory Visit: Payer: No Typology Code available for payment source

## 2018-12-12 ENCOUNTER — Encounter: Payer: Self-pay | Admitting: Podiatry

## 2018-12-12 ENCOUNTER — Other Ambulatory Visit: Payer: Self-pay | Admitting: Podiatry

## 2018-12-12 ENCOUNTER — Ambulatory Visit: Payer: No Typology Code available for payment source | Admitting: Podiatry

## 2018-12-12 DIAGNOSIS — M79671 Pain in right foot: Secondary | ICD-10-CM

## 2018-12-12 DIAGNOSIS — M722 Plantar fascial fibromatosis: Secondary | ICD-10-CM

## 2018-12-12 MED ORDER — MELOXICAM 15 MG PO TABS: 15.0000 mg | ORAL_TABLET | Freq: Every day | ORAL | 1 refills | Status: AC

## 2018-12-12 MED FILL — MELOXICAM 15 MG TABLET: 15 | 30 days supply | Qty: 30 | Fill #0

## 2018-12-13 MED FILL — OMEPRAZOLE 20 MG CAPSULE DR: 20 | 30 days supply | Qty: 30 | Fill #0

## 2018-12-15 NOTE — Progress Notes (Signed)
   Subjective: 57 year old female presenting today as a new patient with a chief complaint of intermittent severe pain to the plantar right heel that began a few years ago. She states it feels like she is walking on a ball sometimes. She also notes discoloration and thickening of the right great toenail that has been ongoing for a few years as well. She has not done anything for treatment of her symptoms. Walking increases the heel pain. Patient is here for further evaluation and treatment.   Past Medical History:  Diagnosis Date  . Anemia   . Anxiety   . Complication of anesthesia   . Depression    denies  . End stage renal disease ([place])    hemodialysis 3x/wk @ [address]  . Family history of adverse reaction to anesthesia    sister vomiting after uterine cyst removal  . Gastritis   . GERD (gastroesophageal reflux disease)   . Hemorrhoids   . Hypertension   . Pneumonia    years ago  . PONV (postoperative nausea and vomiting)   . UTI (lower urinary tract infection) March 2014     Objective: Physical Exam General: The patient is alert and oriented x3 in no acute distress.  Dermatology: Hyperkeratotic, discolored, thickened, onychodystrophy of right great toenail. Skin is warm, dry and supple bilateral lower extremities. Negative for open lesions or macerations bilateral.   Vascular: Dorsalis Pedis and Posterior Tibial pulses palpable bilateral.  Capillary fill time is immediate to all digits.  Neurological: Epicritic and protective threshold intact bilateral.   Musculoskeletal: Tenderness to palpation to the plantar aspect of the right heel along the plantar fascia. All other joints range of motion within normal limits bilateral. Strength 5/5 in all groups bilateral.   Radiographic exam: Normal osseous mineralization. Joint spaces preserved. No fracture/dislocation/boney destruction. No other soft tissue abnormalities or radiopaque foreign bodies.   Assessment: 1.  Plantar fasciitis right 2. Dystrophic nail right hallux   Plan of Care:  1. Patient evaluated. Xrays reviewed.   2. Injection of 0.5cc Celestone soluspan injected into the right plantar fascia  3. Prescription for Meloxicam provided to patient. 4. Mechanical debridement of the right great toenail performed using a nail nipper. Filed with dremel without incident.  5. Return to clinic in 4 weeks.      Edrick Kins, DPM [facility]  Dr. Edrick Kins, DPM    [address], [place] [address]                Office [phone]  Fax [phone]

## 2019-01-09 ENCOUNTER — Ambulatory Visit: Payer: No Typology Code available for payment source | Admitting: Podiatry

## 2019-01-18 MED FILL — AMLODIPINE BESYLATE 5 MG TA: 5 | 30 days supply | Qty: 60 | Fill #0

## 2019-01-31 ENCOUNTER — Other Ambulatory Visit (HOSPITAL_COMMUNITY): Payer: Self-pay | Admitting: Nephrology

## 2019-01-31 DIAGNOSIS — N186 End stage renal disease: Secondary | ICD-10-CM

## 2019-01-31 MED FILL — CARVEDILOL 6.25 MG TABLET: 6.25 | 30 days supply | Qty: 30 | Fill #3

## 2019-02-01 ENCOUNTER — Ambulatory Visit (HOSPITAL_COMMUNITY)
Admission: RE | Admit: 2019-02-01 | Discharge: 2019-02-01 | Disposition: A | Discharge status: Home / Self Care | Payer: Self-pay | Source: Ambulatory Visit | Attending: Nephrology | Admitting: Nephrology

## 2019-02-01 ENCOUNTER — Encounter (HOSPITAL_COMMUNITY): Payer: Self-pay | Admitting: *Deleted

## 2019-02-01 ENCOUNTER — Other Ambulatory Visit: Payer: Self-pay

## 2019-02-01 DIAGNOSIS — Z79899 Other long term (current) drug therapy: Secondary | ICD-10-CM | POA: Insufficient documentation

## 2019-02-01 DIAGNOSIS — K219 Gastro-esophageal reflux disease without esophagitis: Secondary | ICD-10-CM | POA: Insufficient documentation

## 2019-02-01 DIAGNOSIS — I12 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 disease: Secondary | ICD-10-CM | POA: Insufficient documentation

## 2019-02-01 DIAGNOSIS — N186 End stage renal disease: Secondary | ICD-10-CM | POA: Insufficient documentation

## 2019-02-01 DIAGNOSIS — T82510A Breakdown (mechanical) of surgically created arteriovenous fistula, initial encounter: Secondary | ICD-10-CM | POA: Insufficient documentation

## 2019-02-01 DIAGNOSIS — F419 Anxiety disorder, unspecified: Secondary | ICD-10-CM | POA: Insufficient documentation

## 2019-02-01 DIAGNOSIS — Z992 Dependence on renal dialysis: Secondary | ICD-10-CM | POA: Insufficient documentation

## 2019-02-01 DIAGNOSIS — Y841 Kidney dialysis as the cause of abnormal reaction of the patient, or of later complication, without mention of misadventure at the time of the procedure: Secondary | ICD-10-CM | POA: Insufficient documentation

## 2019-02-01 DIAGNOSIS — F329 Major depressive disorder, single episode, unspecified: Secondary | ICD-10-CM | POA: Insufficient documentation

## 2019-02-01 HISTORY — PX: IR DIALY SHUNT INTRO NEEDLE/INTRACATH INITIAL W/IMG LEFT: IMG6102

## 2019-02-01 LAB — POCT I-STAT 4, (NA,K, GLUC, HGB,HCT)
GLUCOSE: 82 mg/dL (ref 70–99)
HCT: 28 % — ABNORMAL LOW (ref 36.0–46.0)
Hemoglobin: 9.5 g/dL — ABNORMAL LOW (ref 12.0–15.0)
Potassium: 4.6 mmol/L (ref 3.5–5.1)
Sodium: 136 mmol/L (ref 135–145)

## 2019-02-01 LAB — BASIC METABOLIC PANEL
ANION GAP: 18 — AB (ref 5–15)
BUN: 81 mg/dL — ABNORMAL HIGH (ref 6–20)
CO2: 21 mmol/L — ABNORMAL LOW (ref 22–32)
Calcium: 9.5 mg/dL (ref 8.9–10.3)
Chloride: 99 mmol/L (ref 98–111)
Creatinine, Ser: 12.65 mg/dL — ABNORMAL HIGH (ref 0.44–1.00)
GFR, EST AFRICAN AMERICAN: 3 mL/min — AB (ref >60)
GFR, EST NON AFRICAN AMERICAN: 3 mL/min — AB (ref >60)
Glucose, Bld: 84 mg/dL (ref 70–99)
POTASSIUM: 4.5 mmol/L (ref 3.5–5.1)
SODIUM: 138 mmol/L (ref 135–145)

## 2019-02-01 MED ORDER — FENTANYL CITRATE (PF) 100 MCG/2ML IJ SOLN
INTRAMUSCULAR | Status: AC | PRN
  Administered 2019-02-01 (×2): 50 ug via INTRAVENOUS

## 2019-02-01 MED ORDER — MIDAZOLAM HCL 2 MG/2ML IJ SOLN
INTRAMUSCULAR | Status: AC
  Filled 2019-02-01: qty 2

## 2019-02-01 MED ORDER — MIDAZOLAM HCL 2 MG/2ML IJ SOLN
INTRAMUSCULAR | Status: AC | PRN
  Administered 2019-02-01 (×2): 1 mg via INTRAVENOUS

## 2019-02-01 MED ORDER — ONDANSETRON HCL 4 MG/2ML IJ SOLN
4.0000 mg | Freq: Once | INTRAMUSCULAR | Status: AC
  Administered 2019-02-01: 4 mg via INTRAVENOUS

## 2019-02-01 MED ORDER — ONDANSETRON HCL 4 MG/2ML IJ SOLN
INTRAMUSCULAR | Status: AC
  Administered 2019-02-01: 4 mg via INTRAVENOUS
  Filled 2019-02-01: qty 2

## 2019-02-01 MED ORDER — ONDANSETRON HCL 4 MG/2ML IJ SOLN
INTRAMUSCULAR | Status: AC
  Filled 2019-02-01: qty 2

## 2019-02-01 MED ORDER — ONDANSETRON 4 MG PO TBDP: 4.0000 mg | ORAL_TABLET | Freq: Once | ORAL | Status: DC

## 2019-02-01 MED ORDER — ONDANSETRON HCL 4 MG/2ML IJ SOLN
INTRAMUSCULAR | Status: AC | PRN
  Administered 2019-02-01: 4 mg via INTRAVENOUS

## 2019-02-01 MED ORDER — IOPAMIDOL (ISOVUE-300) INJECTION 61%
INTRAVENOUS | Status: AC
  Administered 2019-02-01: 32 mL
  Filled 2019-02-01: qty 100

## 2019-02-01 MED ORDER — FENTANYL CITRATE (PF) 100 MCG/2ML IJ SOLN
INTRAMUSCULAR | Status: AC
  Filled 2019-02-01: qty 2

## 2019-02-01 NOTE — Progress Notes (Signed)
All information given/received using [employer]pre[employer] until [place], [place] interpreter arrived.

## 2019-02-01 NOTE — Sedation Documentation (Signed)
ISTAT K+ 4.6

## 2019-02-01 NOTE — Discharge Instructions (Addendum)
Sedacin consciente [employer] adultos, cuidados posteriores (Moderate Conscious Sedation, Adult, Care After) Estas indicaciones le proporcionan informacin acerca de cmo deber cuidarse despus del procedimiento. El mdico tambin podr darle instrucciones ms especficas. El tratamiento ha sido planificado segn las prcticas mdicas actuales, pero en algunos casos pueden ocurrir problemas. Comunquese con el mdico si tiene algn problema o dudas despus del procedimiento. QU ESPERAR DESPUS DEL PROCEDIMIENTO Despus del procedimiento, es comn:  Sentirse somnoliento durante varias horas.  Sentirse torpe y [employer] de equilibrio durante varias horas.  Perder el sentido de la realidad durante varias horas.  Vomitar si come [employer]. INSTRUCCIONES PARA EL CUIDADO EN EL HOGAR  Durante al menos 24horas despus del procedimiento:  No haga lo siguiente: ? Participar en actividades que impliquen posibles cadas o lesiones. ? Conducir vehculos. ? Operar maquinarias pesadas. ? Beber alcohol. ? Tomar somnferos o medicamentos que causen somnolencia. ? Firmar documentos legales ni tomar [employer]. ? Cuidar a nios por su cuenta.  Hacer reposo. Comida y bebida  Siga la dieta recomendada por el mdico.  Si vomita: ? Pruebe agua, jugo o sopa cuando usted pueda beber sin vomitar. ? Asegrese de no tener nuseas antes de ingerir alimentos slidos. Instrucciones generales  Permanezca con un adulto responsable hasta que est completamente despierto y consciente.  Tome los medicamentos de venta libre y los recetados solamente como se lo haya indicado el mdico.  Si fuma, no lo haga sin supervisin.  Concurra a todas las visitas de control como se lo haya indicado el mdico. Esto es importante. SOLICITE ATENCIN MDICA SI:  Sigue teniendo nuseas o vomitando.  Tiene sensacin de desvanecimiento.  Le aparece una erupcin cutnea.  Tiene  fiebre. SOLICITE ATENCIN MDICA DE INMEDIATO SI:  Tiene dificultad para respirar. Esta informacin no tiene Marine scientist el consejo del mdico. Asegrese de hacerle al mdico cualquier pregunta que tenga. Document Released: 12/19/2013 Document Revised: 01/04/2015 Document Reviewed: 04/04/2016 Elsevier Interactive Patient Education  2019 [place] de dilisis, cuidados posteriores Dialysis Fistulogram, Care After [employer] le proporciona informacin sobre cmo cuidarse despus del procedimiento. Su mdico tambin podr darle instrucciones ms especficas. Comunquese con su mdico si tiene problemas o preguntas. Qu puedo esperar despus del procedimiento? Despus del procedimiento, es comn [employer] siguientes sntomas:  Una pequea molestia en la zona en la que se coloc el tubo delgado y pequeo (catter) para el procedimiento.  Un pequeo hematoma alrededor de la fstula.  Somnolencia y cansancio Company secretary). Siga estas indicaciones en su casa: Actividad   Haga reposo en su casa y no levante objetos que pesen ms de 5 lb (2,3 kg) el da despus del procedimiento.  Reanude sus actividades normales segn lo indicado por el mdico. Pregntele al mdico qu actividades son seguras para usted.  No conduzca ni use maquinaria pesada mientras toma analgsicos recetados.  No conduzca durante 24horas si recibi un medicamento para ayudarlo a relajarse (sedante) durante el procedimiento. Medicamentos   [employer] de venta libre y los recetados solamente como se lo haya indicado el mdico. Cuidado del Environmental consultant de la puncin  Siga las indicaciones de su mdico acerca de cmo Government social research officer donde se insertaron los catteres. Asegrese de hacer lo siguiente: ? Lvese las manos con agua y jabn antes de Quar[place] manager las vendas (vendaje). Use desinfectante para manos si no dispone de [place]. ? Cambie el vendaje como se lo haya indicado el mdico. ? No  retire los  puntos (suturas), la goma para cerrar la piel o las tiras [place]. Es posible que estos cierres cutneos Animal nutritionist en la piel durante 2semanas o ms. Si los bordes de las tiras adhesivas empiezan a despegarse y Therapist, sports, puede recortar los que estn sueltos. No retire las tiras [facility] por completo a menos que el mdico se lo indique.  Controle la zona de la puncin todos los das para detectar signos de infeccin. Est atento a los siguientes signos: ? Dolor, hinchazn o enrojecimiento. ? Lquido o sangre. ? Calor. ? Pus o mal olor. Instrucciones generales  No tome baos de inmersin, no nade ni use el jacuzzi hasta que el mdico lo autorice. Pregntele al mdico si puede ducharse. Thurston Pounds solo le permitan darse baos de [place].  Controle atentamente la fstula de dilisis. Verifique para asegurarse de que puede sentir una vibracin o un zumbido (frmito) cuando Risk manager los dedos sobre la fstula.  Evite daar el injerto o la fstula: ? No use ropa ajustada ni joyas en el brazo o la pierna que tiene el injerto o la fstula. ? Informe a todos sus mdicos que tiene un injerto o una fstula para dilisis. ? No permita extracciones de sangre, terapias intravenosas ni lecturas de la presin arterial en el brazo que tiene la fstula o el injerto. ? No permita que le apliquen vacunas antigripales ni otras vacunas en el brazo con la fstula o el injerto.  Concurra a todas las visitas de seguimiento como se lo haya indicado el mdico. Esto es importante. Comunquese con un mdico si:  Tiene enrojecimiento, hinchazn o dolor en el lugar donde se insert el catter.  Observa lquido o sangre que proviene del lugar de la insercin del catter.  El lugar de la insercin del catter est caliente al tacto.  Tiene pus o percibe mal olor que proviene del lugar de la insercin del catter.  Tiene fiebre o siente escalofros. Solicite ayuda de inmediato si:  Se siente  dbil.  Tiene problemas de equilibrio.  Tiene dificultad para mover los brazos o las piernas.  Tiene problemas visuales o para hablar.  Ya no puede sentir una vibracin o un zumbido cuando [employer] dedos sobre la fstula de dilisis.  La extremidad que se [place] para el procedimiento: ? Se hincha. ? Duele. ? Est fra. ? Cambia de color, por ejemplo, se torna azulada o blanco plido.  Siente falta de aire o Tourist information centre manager. Resumen  Despus de [place] fistulografa de dilisis, es comn tener una pequea molestia o algunos moretones en la zona donde se coloc el tubo delgado y pequeo (catter).  Descanse en su [place] despus del procedimiento. Reanude sus actividades normales segn lo indicado por el mdico.  [employer] de venta libre y los recetados solamente como se lo haya indicado el mdico.  Siga las indicaciones de su mdico acerca de cmo Government social research officer donde se insert el catter.  Concurra a todas las visitas de seguimiento como se lo haya indicado el mdico. Esta informacin no tiene Marine scientist el consejo del mdico. Asegrese de hacerle al mdico cualquier pregunta que tenga. Document Released: 04/30/2014 Document Revised: 02/16/2018 Document Reviewed: 02/16/2018 Elsevier Interactive Patient Education  2019 [employer].

## 2019-02-01 NOTE — H&P (Signed)
Chief Complaint: Patient was seen in consultation today for fistulagram with possible intervention.  Referring Physician(s): Sanford,Ryan B  Supervising Physician: Marybelle Killings  Patient Status: [facility] - [facility]  History of Present Illness: Sabrina Mitchell is a 58 y.o. female with a past medical history significant for anxiety, depression, GERD, anemia, HTN and ESRD on dialysis via left upper extremity AVF (T/Th/S) who presents today for fistulagram with possible intervention. Per patient last successful dialysis was Saturday, dialysis was attempted yesterday however her fistula was unable to be cannulated. She has had significant pain at her fistula for about a week however it is now nearly unbearable and she has developed bruising all around her arm. She is requesting pain medication, she tried to get pain medication at the community clinic and they would not prescribe any for her. She denies any other complaints.   Past Medical History:  Diagnosis Date  . Anemia   . Anxiety   . Complication of anesthesia   . Depression    denies  . End stage renal disease ([place])    hemodialysis 3x/wk @ [address]  . Family history of adverse reaction to anesthesia    sister vomiting after uterine cyst removal  . Gastritis   . GERD (gastroesophageal reflux disease)   . Hemorrhoids   . Hypertension   . Pneumonia    years ago  . PONV (postoperative nausea and vomiting)   . UTI (lower urinary tract infection) March 2014    Past Surgical History:  Procedure Laterality Date  . AV FISTULA PLACEMENT Left 07/05/09   Dr. Kellie Simmering  . COLONOSCOPY N/A 02/10/2016   SLF: 1. HEME postive stool due to colon polyps intrernal hemorrhoids 2. small internal hemrrohoids 3. moderate external hemorrhoids.   . ESOPHAGEAL DILATION N/A 02/10/2016   Procedure: ESOPHAGEAL DILATION;  Surgeon: Danie Binder, MD;  Location[facility];  Service: Endoscopy;  Laterality: N/A;  . ESOPHAGOGASTRODUODENOSCOPY  N/A 02/10/2016   SLF: patent Schatzki's ring , mild gastritis/ duodentitis.   Marland Kitchen FISTULA SUPERFICIALIZATION Left 40/08/8118   Procedure: PLICATION BRACHIOCEPHALIC FISTULA;  Surgeon: Angelia Mould, MD;  Location: [place];  Service: Vascular;  Laterality: Left;  . FISTULOGRAM N/A 10/22/2014   Procedure: FISTULOGRAM;  Surgeon: Angelia Mould, MD;  Location[facility] CATH LAB;  Service: Cardiovascular;  Laterality: N/A;  . PERIPHERAL VASCULAR CATHETERIZATION Left 12/09/2016   Procedure: A/V Fistulagram;  Surgeon: Waynetta Sandy, MD;  Location: [place] CV LAB;  Service: Cardiovascular;  Laterality: Left;  . REVISON OF ARTERIOVENOUS FISTULA Left 14/78/2956   Procedure: PLICATION OF BRACHIOCEPHALIC FISTULA;  Surgeon: Conrad [place], MD;  Location: [place];  Service: Vascular;  Laterality: Left;  . REVISON OF ARTERIOVENOUS FISTULA Left 08/09/2017   Procedure: REVISION OF ARTERIOVENOUS FISTULA LIGATION OF SIDE BRANCH;  Surgeon: Waynetta Sandy, MD;  Location: [place];  Service: Vascular;  Laterality: Left;  . TUBAL LIGATION  2004    Allergies: Amitriptyline and Trazodone and nefazodone  Medications: Prior to Admission medications   Medication Sig Start Date End Date Taking? Authorizing Provider  acetaminophen (TYLENOL) 325 MG tablet Take 650 mg by mouth every 6 (six) hours as needed for mild pain.    Yes [provider]  amLODipine (NORVASC) 10 MG tablet Take 1 tablet (10 mg total) by mouth at bedtime. Patient taking differently: Take 5 mg by mouth 2 (two) times daily.  10/10/18  Yes Ladell Pier, MD  carvedilol (COREG) 6.25 MG tablet Take 1  tablet (6.25 mg total) by mouth at bedtime. 10/10/18  Yes Ladell Pier, MD  hydrOXYzine (ATARAX/VISTARIL) 25 MG tablet Take 0.5 tablets (12.5 mg total) by mouth at bedtime as needed (insomnia and itching). 10/10/18  Yes Ladell Pier, MD  omeprazole (PRILOSEC) 20 MG capsule Take 1 capsule (20 mg total) by mouth  daily. 1 PO 30 mins prior to breakfast and supper 08/06/17  Yes Ladell Pier, MD  sevelamer carbonate (RENVELA) 800 MG tablet Take 800 mg by mouth 3 (three) times daily with meals.   Yes [provider]  ethyl chloride spray Apply 1 application topically as needed (for dialysis). Patient not taking: Reported on 12/12/2018 10/10/18   Ladell Pier, MD  oxyCODONE (ROXICODONE) 5 MG immediate release tablet Take 1 tablet (5 mg total) by mouth every 4 (four) hours as needed. Patient not taking: Reported on 12/12/2018 11/04/18   Angelia Mould, MD     Family History  Problem Relation Age of Onset  . Hypertension Mother   . Hypertension Father   . Hypertension Sister   . Breast cancer Sister 4  . Diabetes Brother   . Hypertension Brother     Social History   Socioeconomic History  . Marital status: Significant Other    Spouse name: Not on file  . Number of children: 2  . Years of education: Not on file  . Highest education level: Not on file  Occupational History  . Not on file  Social Needs  . Financial resource strain: Not on file  . Food insecurity:    Worry: Not on file    Inability: Not on file  . Transportation needs:    Medical: Not on file    Non-medical: Not on file  Tobacco Use  . Smoking status: Never Smoker  . Smokeless tobacco: Never Used  Substance and Sexual Activity  . Alcohol use: No  . Drug use: No  . Sexual activity: Not Currently    Birth control/protection: Surgical  Lifestyle  . Physical activity:    Days per week: Not on file    Minutes per session: Not on file  . Stress: Not on file  Relationships  . Social connections:    Talks on phone: Not on file    Gets together: Not on file    Attends religious service: Not on file    Active member of club or organization: Not on file    Attends meetings of clubs or organizations: Not on file    Relationship status: Not on file  Other Topics Concern  . Not on file  Social  History Narrative  . Not on file     Review of Systems: A 12 point ROS discussed and pertinent positives are indicated in the HPI above.  All other systems are negative.  Review of Systems  Constitutional: Negative for fever.  Respiratory: Negative for cough and shortness of breath.   Cardiovascular: Negative for chest pain.  Gastrointestinal: Negative for abdominal pain, diarrhea, nausea and vomiting.  Musculoskeletal:       (+) pain at left AVF  Skin: Positive for color change (bruising to left upper extremity) and wound (scabs on fistula).  Neurological: Negative for dizziness and headaches.  Psychiatric/Behavioral: Negative for confusion.    Vital Signs: BP (!) 162/80   Pulse 72   Temp 97.8 F (36.6 C) (Oral)   Ht 4\' 11"  (1.499 m)   LMP 02/11/2012 Comment: tubal ligation  SpO2 100%   BMI  27.06 kg/m   Physical Exam Vitals signs reviewed.  Constitutional:      General: She is not in acute distress.    Appearance: She is not ill-appearing.     Comments: Daughter and interpreter present at bedside. History obtained from patient via interpreter [place]. Patient appears anxious. Somewhat of a poor historian regarding medical history/medications.   HENT:     Head: Normocephalic and atraumatic.  Cardiovascular:     Rate and Rhythm: Normal rate and regular rhythm.     Heart sounds: Murmur present.     Comments: (+) LUE AVF (+) thrill, (+) bruit) (+) ecchymoses to area of AVF. Small area of scabbing over AVF. Exquisitely tender to palpation. Pulmonary:     Effort: Pulmonary effort is normal.     Breath sounds: Normal breath sounds.  Abdominal:     General: There is no distension.     Palpations: Abdomen is soft.     Tenderness: There is no abdominal tenderness.  Skin:    General: Skin is warm and dry.     Findings: Bruising (left upper arm at location of AVF) present. No erythema.  Neurological:     Mental Status: She is alert and oriented to person, place, and time.    Psychiatric:        Mood and Affect: Mood normal.        Behavior: Behavior normal.        Thought Content: Thought content normal.        Judgment: Judgment normal.      MD Evaluation Airway: WNL Heart: WNL Abdomen: WNL Chest/ Lungs: WNL ASA  Classification: 3 Mallampati/Airway Score: Two   Imaging: No results found.  Labs:  CBC: Recent Labs    02/23/18 1014 11/04/18 0644  WBC 5.5  --   HGB 13.0 10.9*  HCT 38.1 32.0*  PLT 183  --     COAGS: No results for input(s): INR, APTT in the last 8760 hours.  BMP: Recent Labs    11/04/18 0644  NA 136  K 3.1*  GLUCOSE 95    LIVER FUNCTION TESTS: No results for input(s): BILITOT, AST, ALT, ALKPHOS, PROT, ALBUMIN in the last 8760 hours.  TUMOR MARKERS: No results for input(s): AFPTM, CEA, CA199, CHROMGRNA in the last 8760 hours.  Assessment and Plan:  Patient with ESRD on HD via left AVF (T/Th/S) who presents today for fistulagram with possible intervention. Per patient last successful HD was Saturday, unable to cannulate fistula on Tuesday. She is having significant pain and bruising to AVF.  Patient has been NPO since 10 pm last night, she does not take blood thinning medications that she is aware of - none listed in chart. No labs were drawn today.  Risks and benefits discussed with the patient including, but not limited to bleeding, infection, vascular injury, pulmonary embolism, need for tunneled HD catheter placement or even death.  All of the patient's questions were answered, patient is agreeable to proceed.  Consent signed and in chart.  Thank you for this interesting consult.  I greatly enjoyed meeting GAYLIN OSORIA and look forward to participating in their care.  A copy of this report was sent to the requesting provider on this date.  Electronically Signed: Joaquim Nam, PA-C 02/01/2019, 9:58 AM   I spent a total of  30 Minutes in face to face in clinical consultation, greater  than 50% of which was counseling/coordinating care for fistulagram.

## 2019-02-01 NOTE — Progress Notes (Signed)
Pt vomiting clear frothy small amounts.  States she is very nauseated.  Dr Barbie Banner notified order recieved

## 2019-02-01 NOTE — Procedures (Signed)
LUE AV shuntogram No significant stenosis EBL 0 Comp 0

## 2019-02-15 ENCOUNTER — Other Ambulatory Visit: Payer: Self-pay | Admitting: Internal Medicine

## 2019-02-17 MED FILL — ETHYL CHLORIDE AERO: 30 days supply | Qty: 104 | Fill #0

## 2019-02-24 MED FILL — AMLODIPINE BESYLATE 5 MG TA: 5 | 30 days supply | Qty: 60 | Fill #1

## 2019-03-08 MED FILL — CARVEDILOL 6.25 MG TABLET: 6.25 | 30 days supply | Qty: 30 | Fill #4

## 2019-03-28 MED FILL — AMLODIPINE BESYLATE 5 MG TA: 5 | 30 days supply | Qty: 60 | Fill #2

## 2019-04-01 MED FILL — CARVEDILOL 6.25 MG TABLET: 6.25 | 30 days supply | Qty: 30 | Fill #5

## 2019-04-14 MED FILL — ETHYL CHLORIDE AERO: 30 days supply | Qty: 104 | Fill #0

## 2019-04-22 MED FILL — ?AMLODIPINE BESYLATE 5MG TA: 5 | 30 days supply | Qty: 60 | Fill #3

## 2019-04-22 MED FILL — ?CARVEDILOL 6.25 MG TABLET: 6.25 | 30 days supply | Qty: 30 | Fill #6

## 2019-05-16 DIAGNOSIS — R0602 Shortness of breath: Secondary | ICD-10-CM | POA: Insufficient documentation

## 2019-06-08 MED FILL — ?AMLODIPINE BESYLATE 5MG TA: 5 | 30 days supply | Qty: 60 | Fill #4

## 2019-06-08 MED FILL — ?CARVEDILOL 6.25 MG TABLET: 6.25 | 30 days supply | Qty: 30 | Fill #7

## 2019-06-08 MED FILL — ?OMEPRAZOLE 20 MG CAPSULE D: 20 | 30 days supply | Qty: 30 | Fill #1

## 2019-06-09 MED FILL — ETHYL CHLORIDE AERO: 30 days supply | Qty: 104 | Fill #1

## 2019-07-10 MED FILL — ?AMLODIPINE BESYLATE 5MG TA: 5 | 30 days supply | Qty: 60 | Fill #5

## 2019-07-10 MED FILL — ?CARVEDILOL 6.25 MG TABLET: 6.25 | 30 days supply | Qty: 30 | Fill #8

## 2019-07-31 MED FILL — ETHYL CHLORIDE AERO: 30 days supply | Qty: 104 | Fill #2

## 2019-08-11 MED FILL — ?AMLODIPINE BESYLATE 5MG TA: 5 | 30 days supply | Qty: 60 | Fill #6

## 2019-08-11 MED FILL — ?CARVEDILOL 6.25 MG TABLET: 6.25 | 30 days supply | Qty: 30 | Fill #9

## 2019-08-30 MED FILL — ?OMEPRAZOLE 20MG CAP DR: 20 | 30 days supply | Qty: 30 | Fill #2

## 2019-08-30 MED FILL — ETHYL CHLORIDE AERO: 30 days supply | Qty: 104 | Fill #3

## 2019-09-06 MED FILL — ?AMLODIPINE BESYLATE 5MG TA: 5 | 30 days supply | Qty: 60 | Fill #7

## 2019-09-06 MED FILL — ?CARVEDILOL 6.25 MG TABLET: 6.25 | 30 days supply | Qty: 60 | Fill #0

## 2019-09-26 ENCOUNTER — Inpatient Hospital Stay (HOSPITAL_COMMUNITY)
Admission: EM | Admit: 2019-09-26 | Discharge: 2019-09-27 | DRG: 682 | Disposition: A | Discharge status: Home / Self Care | Payer: Self-pay | Attending: Family Medicine | Admitting: Family Medicine

## 2019-09-26 ENCOUNTER — Other Ambulatory Visit: Payer: Self-pay

## 2019-09-26 ENCOUNTER — Emergency Department (HOSPITAL_COMMUNITY): Payer: Self-pay

## 2019-09-26 ENCOUNTER — Encounter (HOSPITAL_COMMUNITY): Payer: Self-pay | Admitting: Emergency Medicine

## 2019-09-26 DIAGNOSIS — G4700 Insomnia, unspecified: Secondary | ICD-10-CM | POA: Diagnosis present

## 2019-09-26 DIAGNOSIS — Z86010 Personal history of colonic polyps: Secondary | ICD-10-CM

## 2019-09-26 DIAGNOSIS — Z888 Allergy status to other drugs, medicaments and biological substances status: Secondary | ICD-10-CM

## 2019-09-26 DIAGNOSIS — J9601 Acute respiratory failure with hypoxia: Secondary | ICD-10-CM | POA: Diagnosis present

## 2019-09-26 DIAGNOSIS — R9431 Abnormal electrocardiogram [ECG] [EKG]: Secondary | ICD-10-CM

## 2019-09-26 DIAGNOSIS — Z885 Allergy status to narcotic agent status: Secondary | ICD-10-CM

## 2019-09-26 DIAGNOSIS — N186 End stage renal disease: Secondary | ICD-10-CM | POA: Diagnosis present

## 2019-09-26 DIAGNOSIS — D631 Anemia in chronic kidney disease: Secondary | ICD-10-CM | POA: Diagnosis present

## 2019-09-26 DIAGNOSIS — E8770 Fluid overload, unspecified: Secondary | ICD-10-CM | POA: Diagnosis present

## 2019-09-26 DIAGNOSIS — Z803 Family history of malignant neoplasm of breast: Secondary | ICD-10-CM

## 2019-09-26 DIAGNOSIS — R778 Other specified abnormalities of plasma proteins: Secondary | ICD-10-CM | POA: Diagnosis present

## 2019-09-26 DIAGNOSIS — I10 Essential (primary) hypertension: Secondary | ICD-10-CM | POA: Diagnosis present

## 2019-09-26 DIAGNOSIS — Z20828 Contact with and (suspected) exposure to other viral communicable diseases: Secondary | ICD-10-CM | POA: Diagnosis present

## 2019-09-26 DIAGNOSIS — F419 Anxiety disorder, unspecified: Secondary | ICD-10-CM | POA: Diagnosis present

## 2019-09-26 DIAGNOSIS — I12 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 disease: Principal | ICD-10-CM | POA: Diagnosis present

## 2019-09-26 DIAGNOSIS — Z833 Family history of diabetes mellitus: Secondary | ICD-10-CM

## 2019-09-26 DIAGNOSIS — Z79899 Other long term (current) drug therapy: Secondary | ICD-10-CM

## 2019-09-26 DIAGNOSIS — Z8249 Family history of ischemic heart disease and other diseases of the circulatory system: Secondary | ICD-10-CM

## 2019-09-26 DIAGNOSIS — J810 Acute pulmonary edema: Secondary | ICD-10-CM

## 2019-09-26 DIAGNOSIS — K219 Gastro-esophageal reflux disease without esophagitis: Secondary | ICD-10-CM | POA: Diagnosis present

## 2019-09-26 DIAGNOSIS — R079 Chest pain, unspecified: Secondary | ICD-10-CM | POA: Diagnosis present

## 2019-09-26 DIAGNOSIS — R7989 Other specified abnormal findings of blood chemistry: Secondary | ICD-10-CM

## 2019-09-26 DIAGNOSIS — I248 Other forms of acute ischemic heart disease: Secondary | ICD-10-CM | POA: Diagnosis present

## 2019-09-26 DIAGNOSIS — Z79891 Long term (current) use of opiate analgesic: Secondary | ICD-10-CM

## 2019-09-26 DIAGNOSIS — Z992 Dependence on renal dialysis: Secondary | ICD-10-CM

## 2019-09-26 DIAGNOSIS — J811 Chronic pulmonary edema: Secondary | ICD-10-CM | POA: Diagnosis present

## 2019-09-26 LAB — CBC WITH DIFFERENTIAL/PLATELET
Abs Immature Granulocytes: 0.01 10*3/uL (ref 0.00–0.07)
Basophils Absolute: 0 10*3/uL (ref 0.0–0.1)
Basophils Relative: 0 %
Eosinophils Absolute: 0.1 10*3/uL (ref 0.0–0.5)
Eosinophils Relative: 2 %
HCT: 35.8 % — ABNORMAL LOW (ref 36.0–46.0)
Hemoglobin: 11.9 g/dL — ABNORMAL LOW (ref 12.0–15.0)
Immature Granulocytes: 0 %
Lymphocytes Relative: 20 %
Lymphs Abs: 1 10*3/uL (ref 0.7–4.0)
MCH: 30.1 pg (ref 26.0–34.0)
MCHC: 33.2 g/dL (ref 30.0–36.0)
MCV: 90.6 fL (ref 80.0–100.0)
Monocytes Absolute: 0.4 10*3/uL (ref 0.1–1.0)
Monocytes Relative: 8 %
Neutro Abs: 3.4 10*3/uL (ref 1.7–7.7)
Neutrophils Relative %: 70 %
Platelets: 170 10*3/uL (ref 150–400)
RBC: 3.95 MIL/uL (ref 3.87–5.11)
RDW: 15.4 % (ref 11.5–15.5)
WBC: 4.9 10*3/uL (ref 4.0–10.5)
nRBC: 0 % (ref 0.0–0.2)

## 2019-09-26 LAB — COMPREHENSIVE METABOLIC PANEL
ALT: 33 U/L (ref 0–44)
AST: 39 U/L (ref 15–41)
Albumin: 3.7 g/dL (ref 3.5–5.0)
Alkaline Phosphatase: 88 U/L (ref 38–126)
Anion gap: 17 — ABNORMAL HIGH (ref 5–15)
BUN: 46 mg/dL — ABNORMAL HIGH (ref 6–20)
CO2: 23 mmol/L (ref 22–32)
Calcium: 10 mg/dL (ref 8.9–10.3)
Chloride: 99 mmol/L (ref 98–111)
Creatinine, Ser: 9.87 mg/dL — ABNORMAL HIGH (ref 0.44–1.00)
GFR calc Af Amer: 5 mL/min — ABNORMAL LOW (ref >60)
GFR calc non Af Amer: 4 mL/min — ABNORMAL LOW (ref >60)
Glucose, Bld: 107 mg/dL — ABNORMAL HIGH (ref 70–99)
Potassium: 3.8 mmol/L (ref 3.5–5.1)
Sodium: 139 mmol/L (ref 135–145)
Total Bilirubin: 0.6 mg/dL (ref 0.3–1.2)
Total Protein: 6.9 g/dL (ref 6.5–8.1)

## 2019-09-26 LAB — TROPONIN I (HIGH SENSITIVITY)
Troponin I (High Sensitivity): 20 ng/L — ABNORMAL HIGH (ref <18)
Troponin I (High Sensitivity): 57 ng/L — ABNORMAL HIGH (ref <18)

## 2019-09-26 LAB — SARS CORONAVIRUS 2 BY RT PCR (HOSPITAL ORDER, PERFORMED IN ~~LOC~~ HOSPITAL LAB): SARS Coronavirus 2: NEGATIVE

## 2019-09-26 MED ORDER — CARVEDILOL 6.25 MG PO TABS
6.2500 mg | ORAL_TABLET | Freq: Every day | ORAL | Status: DC
  Administered 2019-09-26: 6.25 mg via ORAL
  Filled 2019-09-26: qty 1

## 2019-09-26 MED ORDER — HEPARIN SODIUM (PORCINE) 5000 UNIT/ML IJ SOLN
5000.0000 [IU] | Freq: Three times a day (TID) | INTRAMUSCULAR | Status: DC
  Administered 2019-09-26: 5000 [IU] via SUBCUTANEOUS
  Filled 2019-09-26: qty 1

## 2019-09-26 MED ORDER — PANTOPRAZOLE SODIUM 40 MG PO TBEC
40.0000 mg | DELAYED_RELEASE_TABLET | Freq: Every day | ORAL | Status: DC
  Administered 2019-09-27: 40 mg via ORAL
  Filled 2019-09-26: qty 1

## 2019-09-26 MED ORDER — ACETAMINOPHEN 650 MG RE SUPP: 650.0000 mg | Freq: Four times a day (QID) | RECTAL | Status: DC | PRN

## 2019-09-26 MED ORDER — HYDROMORPHONE HCL 1 MG/ML IJ SOLN
INTRAMUSCULAR | Status: AC
  Filled 2019-09-26: qty 0.5

## 2019-09-26 MED ORDER — HYDROXYZINE HCL 25 MG PO TABS: 12.5000 mg | ORAL_TABLET | Freq: Every evening | ORAL | Status: DC | PRN

## 2019-09-26 MED ORDER — ALBUTEROL SULFATE (2.5 MG/3ML) 0.083% IN NEBU: 2.5000 mg | INHALATION_SOLUTION | Freq: Four times a day (QID) | RESPIRATORY_TRACT | Status: DC | PRN

## 2019-09-26 MED ORDER — ACETAMINOPHEN 325 MG PO TABS: 650.0000 mg | ORAL_TABLET | Freq: Four times a day (QID) | ORAL | Status: DC | PRN

## 2019-09-26 MED ORDER — AMLODIPINE BESYLATE 5 MG PO TABS
5.0000 mg | ORAL_TABLET | Freq: Two times a day (BID) | ORAL | Status: DC
  Administered 2019-09-26 – 2019-09-27 (×2): 5 mg via ORAL
  Filled 2019-09-26 (×2): qty 1

## 2019-09-26 MED ORDER — CHLORHEXIDINE GLUCONATE CLOTH 2 % EX PADS
6.0000 | MEDICATED_PAD | Freq: Every day | CUTANEOUS | Status: DC
  Administered 2019-09-27: 6 via TOPICAL

## 2019-09-26 MED ORDER — SEVELAMER CARBONATE 800 MG PO TABS
800.0000 mg | ORAL_TABLET | Freq: Three times a day (TID) | ORAL | Status: DC
  Administered 2019-09-27: 800 mg via ORAL
  Filled 2019-09-26: qty 1

## 2019-09-26 MED ORDER — SODIUM CHLORIDE 0.9% FLUSH
3.0000 mL | Freq: Two times a day (BID) | INTRAVENOUS | Status: DC
  Administered 2019-09-26: 3 mL via INTRAVENOUS

## 2019-09-26 MED ORDER — HYDROMORPHONE HCL 1 MG/ML IJ SOLN
0.5000 mg | INTRAMUSCULAR | Status: DC | PRN
  Administered 2019-09-26: 0.5 mg via INTRAVENOUS

## 2019-09-26 MED ORDER — HEPARIN SODIUM (PORCINE) 1000 UNIT/ML IJ SOLN
INTRAMUSCULAR | Status: AC
  Filled 2019-09-26: qty 1

## 2019-09-26 NOTE — ED Triage Notes (Signed)
Per EMS, pt from home, has had chest pain 1 day, has a cough.  Initial blood pressure was 200/100, after 2 nitro, blood pressure.was 138/90.  Also given 324 ASA.  Has dialysis on T, [place] and Saturday, last Saturday she says it was cut short to 3.5hr b/c "that's what the doctor says it should be now."  Initial heart rate was 140s, now 120s.

## 2019-09-26 NOTE — Consult Note (Signed)
Renal Service Consult Note Sabrina Mitchell 09/26/2019 Sol [place]azing Requesting Physician:  Dr Harvest Forest  Reason for Consult:  ESRD pt w/  HPI: The patient is a 58 y.o. year-old presented to ED w/ chest pain.  In ED BP's high at 200/100, rec'd sl NTG x 2 and ASA.  Gets TTS HD, has not missed. CXR showed pulm edema.  Pt being admitted. Asked to see for dialysis.   Pt reports chest pain anterior chest < 24hrs.  Some SOB as well, no fevers/ chills or cough.  No abd pain, n/v/d.  No problems w/ HD.  On HD 9 yrs.    ROS  denies CP  no joint pain   no HA  no blurry vision  no rash  no diarrhea  no nausea/ vomiting   Past Medical History  Past Medical History:  Diagnosis Date  . Anemia   . Anxiety   . Complication of anesthesia   . Depression    denies  . End stage renal disease ([place])    hemodialysis 3x/wk @ [address]  . Family history of adverse reaction to anesthesia    sister vomiting after uterine cyst removal  . Gastritis   . GERD (gastroesophageal reflux disease)   . Hemorrhoids   . Hypertension   . Pneumonia    years ago  . PONV (postoperative nausea and vomiting)   . UTI (lower urinary tract infection) March 2014   Past Surgical History  Past Surgical History:  Procedure Laterality Date  . AV FISTULA PLACEMENT Left 07/05/09   Dr. Kellie Simmering  . COLONOSCOPY N/A 02/10/2016   SLF: 1. HEME postive stool due to colon polyps intrernal hemorrhoids 2. small internal hemrrohoids 3. moderate external hemorrhoids.   . ESOPHAGEAL DILATION N/A 02/10/2016   Procedure: ESOPHAGEAL DILATION;  Surgeon: Danie Binder, MD;  [facility];  Service: Endoscopy;  Laterality: N/A;  . ESOPHAGOGASTRODUODENOSCOPY N/A 02/10/2016   SLF: patent Schatzki's ring , mild gastritis/ duodentitis.   Marland Kitchen FISTULA SUPERFICIALIZATION Left 05/29/3761   Procedure: PLICATION BRACHIOCEPHALIC FISTULA;  Surgeon: Angelia Mould, MD;  Location: [facility];   Service: Vascular;  Laterality: Left;  . FISTULOGRAM N/A 10/22/2014   Procedure: FISTULOGRAM;  Surgeon: Angelia Mould, MD;  Location: [facility] CATH LAB;  Service: Cardiovascular;  Laterality: N/A;  . IR DIALY SHUNT INTRO NEEDLE/INTRACATH INITIAL W/IMG LEFT Left 02/01/2019  . PERIPHERAL VASCULAR CATHETERIZATION Left 12/09/2016   Procedure: A/V Fistulagram;  Surgeon: Waynetta Sandy, MD;  Location: [facility] CV LAB;  Service: Cardiovascular;  Laterality: Left;  . REVISON OF ARTERIOVENOUS FISTULA Left 83/15/1761   Procedure: PLICATION OF BRACHIOCEPHALIC FISTULA;  Surgeon: Conrad [place], MD;  Location: [facility];  Service: Vascular;  Laterality: Left;  . REVISON OF ARTERIOVENOUS FISTULA Left 08/09/2017   Procedure: REVISION OF ARTERIOVENOUS FISTULA LIGATION OF SIDE BRANCH;  Surgeon: Waynetta Sandy, MD;  Location: [facility];  Service: Vascular;  Laterality: Left;  . TUBAL LIGATION  2004   Family History  Family History  Problem Relation Age of Onset  . Hypertension Mother   . Hypertension Father   . Hypertension Sister   . Breast cancer Sister 68  . Diabetes Brother   . Hypertension Brother    Social History  reports that she has never smoked. She has never used smokeless tobacco. She reports that she does not drink alcohol or use drugs. Allergies  Allergies  Allergen Reactions  . Amitriptyline Other (See  Comments)    Makes her shake  . Trazodone And Nefazodone Other (See Comments)    Makes her shake   Home medications Prior to Admission medications   Medication Sig Start Date End Date Taking? Authorizing Provider  acetaminophen (TYLENOL) 325 MG tablet Take 650 mg by mouth every 6 (six) hours as needed for mild pain.     [provider]  amLODipine (NORVASC) 10 MG tablet Take 1 tablet (10 mg total) by mouth at bedtime. Patient taking differently: Take 5 mg by mouth 2 (two) times daily.  10/10/18   Ladell Pier, MD  carvedilol (COREG) 6.25 MG tablet Take 1  tablet (6.25 mg total) by mouth at bedtime. 10/10/18   Ladell Pier, MD  ethyl chloride spray APPLY 1 APPLICATION TOPICALLY AS NEEDED (FOR DIALYSIS). 02/17/19   Ladell Pier, MD  hydrOXYzine (ATARAX/VISTARIL) 25 MG tablet Take 0.5 tablets (12.5 mg total) by mouth at bedtime as needed (insomnia and itching). 10/10/18   Ladell Pier, MD  omeprazole (PRILOSEC) 20 MG capsule Take 1 capsule (20 mg total) by mouth daily. 1 PO 30 mins prior to breakfast and supper 08/06/17   Ladell Pier, MD  oxyCODONE (ROXICODONE) 5 MG immediate release tablet Take 1 tablet (5 mg total) by mouth every 4 (four) hours as needed. Patient not taking: Reported on 12/12/2018 11/04/18   Angelia Mould, MD  sevelamer carbonate (RENVELA) 800 MG tablet Take 800 mg by mouth 3 (three) times daily with meals.    [provider]   Liver Function Tests Recent Labs  Lab 09/26/19 0702  AST 39  ALT 33  ALKPHOS 88  BILITOT 0.6  PROT 6.9  ALBUMIN 3.7   No results for input(s): LIPASE, AMYLASE in the last 168 hours. CBC Recent Labs  Lab 09/26/19 0702  WBC 4.9  NEUTROABS 3.4  HGB 11.9*  HCT 35.8*  MCV 90.6  PLT 578   Basic Metabolic Panel Recent Labs  Lab 09/26/19 0702  NA 139  K 3.8  CL 99  CO2 23  GLUCOSE 107*  BUN 46*  CREATININE 9.87*  CALCIUM 10.0   Iron/TIBC/Ferritin/ %Sat No results found for: IRON, TIBC, FERRITIN, IRONPCTSAT  Vitals:   09/26/19 1230 09/26/19 1300 09/26/19 1310 09/26/19 1330  BP: (!) 161/88 (!) 159/95 (!) 159/95 (!) 142/77  Pulse: 86 83 79 76  Resp: 16 19 20 14   Temp:      TempSrc:      SpO2: 96% 98% 98% 97%    Exam Gen alert, no distres, nasal O2 No rash, cyanosis or gangrene Sclera anicteric, throat clear  +mild jvd Chest bilat rales at bases, no wheezing RRR no MRG Abd soft ntnd no mass or ascites +bs GU defer MS no joint effusions or deformity Ext no sig LE edema, no wounds or ulcers  Neuro is alert, Ox 3 , nf   LUA  AVF+bruit    Home meds:  - amlodipine 10/ carvedilol 6.25 hs  - oemprazole 20 qd  - oxycodone qid prn 5mg   - sevelamer carb ac tid    Outpt HD: [facility]  3.5h  60.5kg  (coming off below dry)  2/2 bath 350/1.5  Hep 4000  LUA AVF    CXR 9/29 - pulm edema and vasc congestion   Assessment/ Plan: 1. Chest pain - w/u per primary. EKG wnl, trop up a bit and rising.  2. Pulm edema - vol overload and/or ischemia, favor volume.  3. ESRD -  on HD TTS. Needs HD today.  4. HTN - cont meds 5. Anemia ckd - Hb 11.9, follow      Kelly Splinter  MD 09/26/2019, 2:44 PM

## 2019-09-26 NOTE — ED Notes (Signed)
Pt up to BR for BM without acute distress.  SA02 with activity on RA 94%.

## 2019-09-26 NOTE — ED Notes (Addendum)
Pt O2 was 86% on room air, placed on 2L [place], provider aware.  Daughter bedside, very helpful.

## 2019-09-26 NOTE — ED Notes (Signed)
Pt does report that when up to BR had increasing tightness in chest when ambulating off 02.  Resolves some with rest and oxygen.  Updated pt and daughter on POC, Dr. Tamala Julian to admit and will see them soon.

## 2019-09-26 NOTE — Procedures (Signed)
   I was present at this dialysis session, have reviewed the session itself and made  appropriate changes Kelly Splinter MD [place] pager [phone]   09/26/2019, 3:00 PM

## 2019-09-26 NOTE — ED Notes (Signed)
Report called to dialysis and bed placement called for admission bed following treatment.

## 2019-09-26 NOTE — ED Provider Notes (Signed)
[facility] EMERGENCY DEPARTMENT Provider Note   CSN: [id number] Arrival date & time: 09/26/19  2202     History   Chief Complaint Chief Complaint  Patient presents with  . Chest Pain  . Shortness of Breath    HPI Sabrina Mitchell is a 58 y.o. female.   Spanish interpreter was used throughout this evaluation.  Patient's daughter at bedside also assists with history.  HPI   Patient is a 58 year old female with a history of anemia, ESRD on dialysis (T/Th/Sat), GERD, hypertension, pneumonia, who presents to the emergency department today for evaluation of chest pain and shortness of breath.  Patient has had mid, substernal chest pain since yesterday.  She describes this as "strong, tight and squeezing".  Pain initially 10/10 however improved to 6/10 after receiving 325 ASA and 2 SL NTG in route with EMS.  She also reports associated shortness of breath, nausea, and bilateral lower extremity swelling for the last several days.  She has a mild cough productive of "saliva ".  She states that this cough is due to her chest pain and has not been persistent.  She denies fevers, chills, body aches, rhinorrhea, congestion, sore throat or known COVID contacts.  Denies abdominal pain, vomiting, diarrhea.  Per EMS, patient initially tachycardic in the 140s, heart rate improved to the 120s after meds.  She was also tachypneic and hypertensive in the 542H systolic.  Blood pressure improved to 062 systolic after nitro.  States she went to dialysis on Saturday and completed full session of 3.5 hours.  Daughter at bedside states patient recently had sessions decreased 2 months ago from 3.75 hours. She is due for dialysis today at 11am.  Past Medical History:  Diagnosis Date  . Anemia   . Anxiety   . Complication of anesthesia   . Depression    denies  . End stage renal disease ([place])    hemodialysis 3x/wk @ [address]  . Family history of adverse reaction to  anesthesia    sister vomiting after uterine cyst removal  . Gastritis   . GERD (gastroesophageal reflux disease)   . Hemorrhoids   . Hypertension   . Pneumonia    years ago  . PONV (postoperative nausea and vomiting)   . UTI (lower urinary tract infection) March 2014    Patient Active Problem List   Diagnosis Date Noted  . Calcaneal spur of right foot 10/10/2018  . 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 GERD (gastroesophageal reflux disease) 01/17/2016  . 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 Post-menopausal bleeding 04/06/2012  . End stage renal disease on dialysis ([place]) 04/06/2012  . Hypertension 04/06/2012    Past Surgical History:  Procedure Laterality Date  . AV FISTULA PLACEMENT Left 07/05/09   Dr. Kellie Simmering  . COLONOSCOPY N/A 02/10/2016   SLF: 1. HEME postive stool due to colon polyps intrernal hemorrhoids 2. small internal hemrrohoids 3. moderate external hemorrhoids.   . ESOPHAGEAL DILATION N/A 02/10/2016   Procedure: ESOPHAGEAL DILATION;  Surgeon: Danie Binder, MD;  [facility];  Service: Endoscopy;  Laterality: N/A;  . ESOPHAGOGASTRODUODENOSCOPY N/A 02/10/2016   SLF: patent Schatzki's ring , mild gastritis/ duodentitis.   Marland Kitchen FISTULA SUPERFICIALIZATION Left 37/05/2830   Procedure: PLICATION BRACHIOCEPHALIC FISTULA;  Surgeon: Angelia Mould, MD;  Location: [facility];  Service: Vascular;  Laterality: Left;  . FISTULOGRAM N/A 10/22/2014   Procedure: FISTULOGRAM;  Surgeon: Angelia Mould, MD;  Location:  [facility] CATH LAB;  Service: Cardiovascular;  Laterality: N/A;  . IR DIALY SHUNT INTRO NEEDLE/INTRACATH INITIAL W/IMG LEFT Left 02/01/2019  . PERIPHERAL VASCULAR CATHETERIZATION Left 12/09/2016   Procedure: A/V Fistulagram;  Surgeon: Waynetta Sandy, MD;  Location: [facility] CV LAB;  Service: Cardiovascular;  Laterality: Left;  . REVISON OF ARTERIOVENOUS FISTULA Left 16/09/9603   Procedure: PLICATION OF  BRACHIOCEPHALIC FISTULA;  Surgeon: Conrad [place], MD;  Location: [facility];  Service: Vascular;  Laterality: Left;  . REVISON OF ARTERIOVENOUS FISTULA Left 08/09/2017   Procedure: REVISION OF ARTERIOVENOUS FISTULA LIGATION OF SIDE BRANCH;  Surgeon: Waynetta Sandy, MD;  Location: [facility];  Service: Vascular;  Laterality: Left;  . TUBAL LIGATION  2004     OB History    Gravida  5   Para      Term      Preterm      AB  3   Living  2     SAB  3   TAB      Ectopic      Multiple      Live Births  2            Home Medications    Prior to Admission medications   Medication Sig Start Date End Date Taking? Authorizing Provider  acetaminophen (TYLENOL) 325 MG tablet Take 650 mg by mouth every 6 (six) hours as needed for mild pain.     [provider]  amLODipine (NORVASC) 10 MG tablet Take 1 tablet (10 mg total) by mouth at bedtime. Patient taking differently: Take 5 mg by mouth 2 (two) times daily.  10/10/18   Ladell Pier, MD  carvedilol (COREG) 6.25 MG tablet Take 1 tablet (6.25 mg total) by mouth at bedtime. 10/10/18   Ladell Pier, MD  ethyl chloride spray APPLY 1 APPLICATION TOPICALLY AS NEEDED (FOR DIALYSIS). 02/17/19   Ladell Pier, MD  hydrOXYzine (ATARAX/VISTARIL) 25 MG tablet Take 0.5 tablets (12.5 mg total) by mouth at bedtime as needed (insomnia and itching). 10/10/18   Ladell Pier, MD  omeprazole (PRILOSEC) 20 MG capsule Take 1 capsule (20 mg total) by mouth daily. 1 PO 30 mins prior to breakfast and supper 08/06/17   Ladell Pier, MD  oxyCODONE (ROXICODONE) 5 MG immediate release tablet Take 1 tablet (5 mg total) by mouth every 4 (four) hours as needed. Patient not taking: Reported on 12/12/2018 11/04/18   Angelia Mould, MD  sevelamer carbonate (RENVELA) 800 MG tablet Take 800 mg by mouth 3 (three) times daily with meals.    [provider]    Family History Family History  Problem Relation Age of  Onset  . Hypertension Mother   . Hypertension Father   . Hypertension Sister   . Breast cancer Sister 40  . Diabetes Brother   . Hypertension Brother     Social History Social History   Tobacco Use  . Smoking status: Never Smoker  . Smokeless tobacco: Never Used  Substance Use Topics  . Alcohol use: No  . Drug use: No     Allergies   Amitriptyline and Trazodone and nefazodone   Review of Systems Review of Systems  Constitutional: Negative for chills, diaphoresis and fever.  HENT: Negative for ear pain and sore throat.   Eyes: Negative for visual disturbance.  Respiratory: Positive for cough and shortness of breath.   Cardiovascular: Positive for chest pain and leg swelling.  Gastrointestinal: Positive for nausea. Negative  for abdominal pain, constipation, diarrhea and vomiting.  Genitourinary: Negative for dysuria and hematuria.  Musculoskeletal: Negative for back pain.  Skin: Negative for rash.  Neurological: Positive for headaches (ater ntg).  All other systems reviewed and are negative.    Physical Exam Updated Vital Signs BP (!) 146/78   Pulse 77   Temp 98.2 F (36.8 C) (Oral)   Resp 16   LMP 02/11/2012 Comment: tubal ligation  SpO2 99%   Physical Exam Vitals signs and nursing note reviewed.  Constitutional:      General: She is not in acute distress.    Appearance: She is well-developed.  HENT:     Head: Normocephalic and atraumatic.  Eyes:     Conjunctiva/sclera: Conjunctivae normal.  Neck:     Musculoskeletal: Neck supple.  Cardiovascular:     Rate and Rhythm: Normal rate and regular rhythm.     Pulses:          Dorsalis pedis pulses are 2+ on the right side and 2+ on the left side.     Heart sounds: No murmur.  Pulmonary:     Effort: Pulmonary effort is normal. No respiratory distress.     Breath sounds: Examination of the right-middle field reveals rales. Examination of the left-middle field reveals rales. Examination of the right-lower  field reveals rales. Examination of the left-lower field reveals rales. Rales present. No wheezing or rhonchi.  Abdominal:     Palpations: Abdomen is soft.     Tenderness: There is no abdominal tenderness.  Musculoskeletal:     Comments: Trace pedal edema bilaterally  Skin:    General: Skin is warm and dry.  Neurological:     Mental Status: She is alert.     ED Treatments / Results  Labs (all labs ordered are listed, but only abnormal results are displayed) Labs Reviewed  CBC WITH DIFFERENTIAL/PLATELET - Abnormal; Notable for the following components:      Result Value   Hemoglobin 11.9 (*)    HCT 35.8 (*)    All other components within normal limits  COMPREHENSIVE METABOLIC PANEL - Abnormal; Notable for the following components:   Glucose, Bld 107 (*)    BUN 46 (*)    Creatinine, Ser 9.87 (*)    GFR calc non Af Amer 4 (*)    GFR calc Af Amer 5 (*)    Anion gap 17 (*)    All other components within normal limits  TROPONIN I (HIGH SENSITIVITY) - Abnormal; Notable for the following components:   Troponin I (High Sensitivity) 20 (*)    All other components within normal limits  TROPONIN I (HIGH SENSITIVITY) - Abnormal; Notable for the following components:   Troponin I (High Sensitivity) 57 (*)    All other components within normal limits  SARS CORONAVIRUS 2 (HOSPITAL ORDER, [facility])    EKG None  Radiology Dg Chest Portable 1 View  Result Date: 09/26/2019 CLINICAL DATA:  Chest pain for 1 day, cough, elevated blood pressure, history end-stage renal disease on dialysis, hypertension EXAM: PORTABLE CHEST 1 VIEW COMPARISON:  Portable exam 0740 hours compared to 04/01/2010 FINDINGS: Enlargement of cardiac silhouette with pulmonary vascular congestion. Mediastinal contours normal. Diffuse interstitial infiltrates favoring pulmonary edema over atypical infection. No pleural effusion or pneumothorax. IMPRESSION: Enlargement of cardiac silhouette with  pulmonary vascular congestion and diffuse probable pulmonary edema. Electronically Signed   By: Lavonia Dana M.D.   On: 09/26/2019 08:07    Procedures Procedures (  including critical care time)  Medications Ordered in ED Medications - No data to display   Initial Impression / Assessment and Plan / ED Course  I have reviewed the triage vital signs and the nursing notes.  Pertinent labs & imaging results that were available during my care of the patient were reviewed by me and considered in my medical decision making (see chart for details).   Final Clinical Impressions(s) / ED Diagnoses   Final diagnoses:  Acute respiratory failure with hypoxia (HCC)  Chest pain, unspecified type   Patient is a 58 year old female with a history of ESRD on dialysis (T/Th/Sat) c/o CP and SOB. Also w/ intermittent cough. CP improved with NTG and ASA. Compliant w/ dialysis on Sat. Due for dialysis today at 11am.   On arrival, sats noted to be 86% on room air. Was placed on 2L and sats in 90s so increased to 4L. Patient marginally tachycardic. BP in the 150s.   Lungs with rales bilaterally. Mild pedal edema noted. Marginally tachycardic w/o murmurs.   CBC is without leukocytosis, mild anemia present improved from prior CMP with elevated BUN/creatinine consistent with the need for dialysis.  Normal electrolytes. Trop is slightly elevated at 20, delta troponin is uptrending and is now 67 COVID negative   EKG sinus tachycardia, LVH with secondary repolarization abnormality and borderline prolonged QT interval. No ischemic changes.     CXR with enlargement of cardiac silhouette with pulmonary vascular congestion and diffuse probable pulmonary edema  Given the character of her CP and elevated troponin will admit for chest pain to r/o ACS. She will also need dialysis as she appears fluid overloaded with pulmonary edem and is hypoxic secondary to this.   10:28 AM CONSULT with Dr. Tamala Julian who accepts patient for  admission.   10:41 AM CONSULT with Dr. Jonnie Finner who states nephrology will consult on the patient.    ED Discharge Orders    None       Bishop Dublin 09/26/19 1041    Davonna Belling, MD 09/26/19 [phone]

## 2019-09-26 NOTE — H&P (Addendum)
History and Physical    Sabrina Mitchell GEZ:[id number] DOB: 08/04/1961 DOA: 09/26/2019  Referring MD/NP/PA: Coral Ceo, PA-C PCP: Ladell Pier, MD  Patient coming from: Home via EMS  Chief Complaint: Chest pain and shortness of breath  I have personally briefly reviewed patient's old medical records in [place]   HPI: Sabrina Mitchell is a 58 y.o. female with medical history significant of ESRD on HD, hypertension, anxiety, depression, and GERD; who presents with complaints of chest pain and shortness of breath.  Patient's daughter present at bedside acts as Optometrist.  Symptoms initially started around 6 AM yesterday morning.  She complained of substernal chest pain that she describes as throbbing.   Associated symptoms included intermittent night sweats over the last week, nausea, leg swelling, and a dry cough.  Denies any radiation of chest pain, fever, recent sick contacts, vomiting, diarrhea, or loss of consciousness.  She took her home blood pressure medications with some mild improvement.  However, symptoms recurred this morning.  For which she called EMS.  She has not missed any hemodialysis sessions.  Patient normally dialyzes for 3 hours, but questions if she needs to dialyze for 4 more time.  She also notes that she normally on fluid chest tightness symptoms with hemodialysis and normally improves following the treatment.  En route with EMS patient's blood pressure was elevated up to 200/100. She was given full dose aspirin and 2 nitroglycerin with improvement of blood pressure down to 138/90 .  ED Course: Upon admission patient was found to be afebrile, pulse 73-1 03, respirations 11-24, blood pressure 138/88-161/90, and O2 saturations 90-100% on 2 L nasal cannula oxygen.  Labs significant for WBC 4.9, hemoglobin 11.9, BUN 46, creatinine 9.87, anion gap 17, and troponin 20->57.  COVID-19 screening negative.  Chest x-ray showing enlarged cardiac  silhouette with pulmonary vascular congestion and probable diffuse pulmonary edema.  Nephrology was consulted for need of hemodialysis  Review of Systems  Constitutional: Positive for diaphoresis. Negative for fever.  HENT: Negative for ear pain.   Eyes: Negative for double vision and photophobia.  Respiratory: Positive for cough and shortness of breath. Negative for sputum production.   Cardiovascular: Positive for chest pain. Negative for leg swelling.  Gastrointestinal: Positive for nausea. Negative for abdominal pain, constipation and vomiting.  Genitourinary: Negative for dysuria and frequency.  Musculoskeletal: Negative for back pain and myalgias.  Skin: Negative for rash.  Neurological: Negative for focal weakness and loss of consciousness.  Psychiatric/Behavioral: Negative for memory loss and substance abuse. The patient has insomnia.     Past Medical History:  Diagnosis Date   Anemia    Anxiety    Complication of anesthesia    Depression    denies   End stage renal disease ([place])    hemodialysis 3x/wk @ [place]   Family history of adverse reaction to anesthesia    sister vomiting after uterine cyst removal   Gastritis    GERD (gastroesophageal reflux disease)    Hemorrhoids    Hypertension    Pneumonia    years ago   PONV (postoperative nausea and vomiting)    UTI (lower urinary tract infection) March 2014    Past Surgical History:  Procedure Laterality Date   AV FISTULA PLACEMENT Left 07/05/09   Dr. Kellie Simmering   COLONOSCOPY N/A 02/10/2016   SLF: 1. HEME postive stool due to colon polyps intrernal hemorrhoids 2. small internal hemrrohoids 3. moderate external hemorrhoids.    ESOPHAGEAL DILATION  N/A 02/10/2016   Procedure: ESOPHAGEAL DILATION;  Surgeon: Danie Binder, MD;  [facility];  Service: Endoscopy;  Laterality: N/A;   ESOPHAGOGASTRODUODENOSCOPY N/A 02/10/2016   SLF: patent Schatzki's ring , mild gastritis/ duodentitis.      FISTULA SUPERFICIALIZATION Left 37/05/2830   Procedure: PLICATION BRACHIOCEPHALIC FISTULA;  Surgeon: Angelia Mould, MD;  Location: [facility];  Service: Vascular;  Laterality: Left;   FISTULOGRAM N/A 10/22/2014   Procedure: FISTULOGRAM;  Surgeon: Angelia Mould, MD;  Location: [facility] CATH LAB;  Service: Cardiovascular;  Laterality: N/A;   IR DIALY SHUNT INTRO NEEDLE/INTRACATH INITIAL W/IMG LEFT Left 02/01/2019   PERIPHERAL VASCULAR CATHETERIZATION Left 12/09/2016   Procedure: A/V Fistulagram;  Surgeon: Waynetta Sandy, MD;  Location: [facility] CV LAB;  Service: Cardiovascular;  Laterality: Left;   REVISON OF ARTERIOVENOUS FISTULA Left 51/76/1607   Procedure: PLICATION OF BRACHIOCEPHALIC FISTULA;  Surgeon: Conrad Manhasset, MD;  Location: [facility];  Service: Vascular;  Laterality: Left;   REVISON OF ARTERIOVENOUS FISTULA Left 08/09/2017   Procedure: REVISION OF ARTERIOVENOUS FISTULA LIGATION OF SIDE BRANCH;  Surgeon: Waynetta Sandy, MD;  Location: [facility];  Service: Vascular;  Laterality: Left;   TUBAL LIGATION  2004     reports that she has never smoked. She has never used smokeless tobacco. She reports that she does not drink alcohol or use drugs.  Allergies  Allergen Reactions   Amitriptyline Other (See Comments)    Makes her shake   Trazodone And Nefazodone Other (See Comments)    Makes her shake    Family History  Problem Relation Age of Onset   Hypertension Mother    Hypertension Father    Hypertension Sister    Breast cancer Sister 5   Diabetes Brother    Hypertension Brother     Prior to Admission medications   Medication Sig Start Date End Date Taking? Authorizing Provider  acetaminophen (TYLENOL) 325 MG tablet Take 650 mg by mouth every 6 (six) hours as needed for mild pain.     [provider]  amLODipine (NORVASC) 10 MG tablet Take 1 tablet (10 mg total) by mouth at bedtime. Patient taking differently: Take 5 mg by mouth 2  (two) times daily.  10/10/18   Ladell Pier, MD  carvedilol (COREG) 6.25 MG tablet Take 1 tablet (6.25 mg total) by mouth at bedtime. 10/10/18   Ladell Pier, MD  ethyl chloride spray APPLY 1 APPLICATION TOPICALLY AS NEEDED (FOR DIALYSIS). 02/17/19   Ladell Pier, MD  hydrOXYzine (ATARAX/VISTARIL) 25 MG tablet Take 0.5 tablets (12.5 mg total) by mouth at bedtime as needed (insomnia and itching). 10/10/18   Ladell Pier, MD  omeprazole (PRILOSEC) 20 MG capsule Take 1 capsule (20 mg total) by mouth daily. 1 PO 30 mins prior to breakfast and supper 08/06/17   Ladell Pier, MD  oxyCODONE (ROXICODONE) 5 MG immediate release tablet Take 1 tablet (5 mg total) by mouth every 4 (four) hours as needed. Patient not taking: Reported on 12/12/2018 11/04/18   Angelia Mould, MD  sevelamer carbonate (RENVELA) 800 MG tablet Take 800 mg by mouth 3 (three) times daily with meals.    [provider]    Physical Exam:  Constitutional: Middle-aged female NAD, calm, comfortable Vitals:   09/26/19 0830 09/26/19 0845 09/26/19 0900 09/26/19 0915  BP: (!) 148/84 (!) 145/79 (!) 145/79 (!) 146/78  Pulse: 76 73 80 77  Resp: 13 11 17 16   Temp:  TempSrc:      SpO2: 100% 99% 100% 99%   Eyes: PERRL, lids and conjunctivae normal ENMT: Mucous membranes are moist. Posterior pharynx clear of any exudate or lesions.  Neck: normal, supple, no masses, no thyromegaly Respiratory: Positive crackles heard throughout both lung fields.  Patient currently on 2 L nasal cannula oxygen.  No significant wheezes or rhonchi appreciated. Cardiovascular: Regular rate and rhythm, no murmurs / rubs / gallops. No extremity edema. 2+ pedal pulses. No carotid bruits.  Left upper extremity fistula with palpable thrill. Abdomen: no tenderness, no masses palpated. No hepatosplenomegaly. Bowel sounds positive.  Musculoskeletal: no clubbing / cyanosis. No joint deformity upper and lower extremities. Good  ROM, no contractures. Normal muscle tone.  Skin: no rashes, lesions, ulcers. No induration Neurologic: CN 2-12 grossly intact. Sensation intact, DTR normal. Strength 5/5 in all 4.  Psychiatric: Normal judgment and insight. Alert and oriented x 3.  Anxious mood.     Labs on Admission: I have personally reviewed following labs and imaging studies  CBC: Recent Labs  Lab 09/26/19 0702  WBC 4.9  NEUTROABS 3.4  HGB 11.9*  HCT 35.8*  MCV 90.6  PLT 604   Basic Metabolic Panel: Recent Labs  Lab 09/26/19 0702  NA 139  K 3.8  CL 99  CO2 23  GLUCOSE 107*  BUN 46*  CREATININE 9.87*  CALCIUM 10.0   GFR: CrCl cannot be calculated (Unknown ideal weight.). Liver Function Tests: Recent Labs  Lab 09/26/19 0702  AST 39  ALT 33  ALKPHOS 88  BILITOT 0.6  PROT 6.9  ALBUMIN 3.7   No results for input(s): LIPASE, AMYLASE in the last 168 hours. No results for input(s): AMMONIA in the last 168 hours. Coagulation Profile: No results for input(s): INR, PROTIME in the last 168 hours. Cardiac Enzymes: No results for input(s): CKTOTAL, CKMB, CKMBINDEX, TROPONINI in the last 168 hours. BNP (last 3 results) No results for input(s): PROBNP in the last 8760 hours. HbA1C: No results for input(s): HGBA1C in the last 72 hours. CBG: No results for input(s): GLUCAP in the last 168 hours. Lipid Profile: No results for input(s): CHOL, HDL, LDLCALC, TRIG, CHOLHDL, LDLDIRECT in the last 72 hours. Thyroid Function Tests: No results for input(s): TSH, T4TOTAL, FREET4, T3FREE, THYROIDAB in the last 72 hours. Anemia Panel: No results for input(s): VITAMINB12, FOLATE, FERRITIN, TIBC, IRON, RETICCTPCT in the last 72 hours. Urine analysis:    Component Value Date/Time   COLORURINE YELLOW 07/24/2017 2338   APPEARANCEUR Clear 03/17/2018 0920   LABSPEC 1.007 07/24/2017 2338   PHURINE 9.0 (H) 07/24/2017 2338   GLUCOSEU Trace (A) 03/17/2018 0920   HGBUR NEGATIVE 07/24/2017 2338   BILIRUBINUR Negative  03/17/2018 0920   KETONESUR NEGATIVE 07/24/2017 2338   PROTEINUR 2+ (A) 03/17/2018 0920   PROTEINUR >=300 (A) 07/24/2017 2338   UROBILINOGEN 0.2 02/18/2012 1850   NITRITE Negative 03/17/2018 0920   NITRITE NEGATIVE 07/24/2017 2338   LEUKOCYTESUR Trace (A) 03/17/2018 0920   Sepsis Labs: Recent Results (from the past 240 hour(s))  SARS Coronavirus 2 [facility] order, Performed in [facility] hospital lab) Nasopharyngeal Nasopharyngeal Swab     Status: None   Collection Time: 09/26/19  8:02 AM   Specimen: Nasopharyngeal Swab  Result Value Ref Range Status   SARS Coronavirus 2 NEGATIVE NEGATIVE Final    Comment: (NOTE) If result is NEGATIVE SARS-CoV-2 target nucleic acids are NOT DETECTED. The SARS-CoV-2 RNA is generally detectable in upper and lower  respiratory specimens during the  acute phase of infection. The lowest  concentration of SARS-CoV-2 viral copies this assay can detect is 250  copies / mL. A negative result does not preclude SARS-CoV-2 infection  and should not be used as the sole basis for treatment or other  patient management decisions.  A negative result may occur with  improper specimen collection / handling, submission of specimen other  than nasopharyngeal swab, presence of viral mutation(s) within the  areas targeted by this assay, and inadequate number of viral copies  (<250 copies / mL). A negative result must be combined with clinical  observations, patient history, and epidemiological information. If result is POSITIVE SARS-CoV-2 target nucleic acids are DETECTED. The SARS-CoV-2 RNA is generally detectable in upper and lower  respiratory specimens dur ing the acute phase of infection.  Positive  results are indicative of active infection with SARS-CoV-2.  Clinical  correlation with patient history and other diagnostic information is  necessary to determine patient infection status.  Positive results do  not rule out bacterial infection or co-infection with  other viruses. If result is PRESUMPTIVE POSTIVE SARS-CoV-2 nucleic acids MAY BE PRESENT.   A presumptive positive result was obtained on the submitted specimen  and confirmed on repeat testing.  While 2019 novel coronavirus  (SARS-CoV-2) nucleic acids may be present in the submitted sample  additional confirmatory testing may be necessary for epidemiological  and / or clinical management purposes  to differentiate between  SARS-CoV-2 and other Sarbecovirus currently known to infect humans.  If clinically indicated additional testing with an alternate test  methodology [phone]) is advised. The SARS-CoV-2 RNA is generally  detectable in upper and lower respiratory sp ecimens during the acute  phase of infection. The expected result is Negative. Fact Sheet for Patients:  StrictlyIdeas.no Fact Sheet for Healthcare Providers: BankingDealers.co.za This test is not yet approved or cleared by the [place] FDA and has been authorized for detection and/or diagnosis of SARS-CoV-2 by FDA under an Emergency Use Authorization (EUA).  This EUA will remain in effect (meaning this test can be used) for the duration of the COVID-19 declaration under Section 564(b)(1) of the Act, 21 U.S.C. section 360bbb-3(b)(1), unless the authorization is terminated or revoked sooner. Performed at [facility], [place] [address], [place], [place] [address]      Radiological Exams on Admission: Dg Chest Portable 1 View  Result Date: 09/26/2019 CLINICAL DATA:  Chest pain for 1 day, cough, elevated blood pressure, history end-stage renal disease on dialysis, hypertension EXAM: PORTABLE CHEST 1 VIEW COMPARISON:  Portable exam 0740 hours compared to 04/01/2010 FINDINGS: Enlargement of cardiac silhouette with pulmonary vascular congestion. Mediastinal contours normal. Diffuse interstitial infiltrates favoring pulmonary edema over atypical infection. No pleural effusion  or pneumothorax. IMPRESSION: Enlargement of cardiac silhouette with pulmonary vascular congestion and diffuse probable pulmonary edema. Electronically Signed   By: Lavonia Dana M.D.   On: 09/26/2019 08:07    EKG: Independently reviewed.  Sinus tachycardia 109 bpm with QTc 505.  No significant ischemic changes noted.  Assessment/Plan Pulmonary edema secondary to ESRD on HD: Patient presents with complaints of chest pain, cough, and shortness of breath.  She reports compliance with hemodialysis.  Chest x-ray showing diffuse pulmonary edema.  Potassium 3.8, BUN 46, and creatinine 9.87.  Nephrology was consulted to dialyze patient. -Admit to a medical telemetry bed -Nasal cannula oxygen as needed to maintain O2 saturation greater than 92% -Recheck renal function panel in a.m. -Appreciate nephrology consultative services, will follow for further  recommendations  Chest pain, elevated troponin: Acute.  Initial troponin 20 and repeat 57.  Suspect secondary to demand with patient being acutely fluid overloaded. -Follow-up telemetry -May warrant further investigation if symptoms persist  Essential hypertension: Blood pressures initially elevated up to 168/80.  Previous home meds include amlodipine and Coreg. -Continue home medication  Prolonged QT interval: Acute.  QTc 505. -Recheck EKG in a.m. -Avoiding QT prolonging medications   Elevated anion gap: Acute.  Anion gap elevated at 17.   -Continue to monitor DVT prophylaxis: Heparin Code Status: Full Family Communication: Discussed plan of care with patient's daughter present at bedside. Disposition Plan: possible discharge home in 2-3 days Consults called: Nephrology Admission status: Inpatient  Norval Morton MD [facility] Pager [phone]   If 7PM-7AM, please contact night-coverage [email or web address] Password [facility]  09/26/2019, 10:31 AM

## 2019-09-27 ENCOUNTER — Encounter (HOSPITAL_COMMUNITY): Payer: Self-pay

## 2019-09-27 DIAGNOSIS — J9601 Acute respiratory failure with hypoxia: Secondary | ICD-10-CM

## 2019-09-27 LAB — CBC
HCT: 34.7 % — ABNORMAL LOW (ref 36.0–46.0)
Hemoglobin: 11.1 g/dL — ABNORMAL LOW (ref 12.0–15.0)
MCH: 29 pg (ref 26.0–34.0)
MCHC: 32 g/dL (ref 30.0–36.0)
MCV: 90.6 fL (ref 80.0–100.0)
Platelets: 179 10*3/uL (ref 150–400)
RBC: 3.83 MIL/uL — ABNORMAL LOW (ref 3.87–5.11)
RDW: 15.7 % — ABNORMAL HIGH (ref 11.5–15.5)
WBC: 8.2 10*3/uL (ref 4.0–10.5)
nRBC: 0 % (ref 0.0–0.2)

## 2019-09-27 LAB — RENAL FUNCTION PANEL
Albumin: 3.4 g/dL — ABNORMAL LOW (ref 3.5–5.0)
Anion gap: 15 (ref 5–15)
BUN: 19 mg/dL (ref 6–20)
CO2: 25 mmol/L (ref 22–32)
Calcium: 10.2 mg/dL (ref 8.9–10.3)
Chloride: 98 mmol/L (ref 98–111)
Creatinine, Ser: 6.59 mg/dL — ABNORMAL HIGH (ref 0.44–1.00)
GFR calc Af Amer: 7 mL/min — ABNORMAL LOW (ref >60)
GFR calc non Af Amer: 6 mL/min — ABNORMAL LOW (ref >60)
Glucose, Bld: 83 mg/dL (ref 70–99)
Phosphorus: 6.1 mg/dL — ABNORMAL HIGH (ref 2.5–4.6)
Potassium: 4.4 mmol/L (ref 3.5–5.1)
Sodium: 138 mmol/L (ref 135–145)

## 2019-09-27 MED FILL — ?CARVEDILOL 6.25 MG TABLET: 6.25 | 30 days supply | Qty: 60 | Fill #1

## 2019-09-27 MED FILL — ?AMLODIPINE BESYLATE 5MG TA: 5 | 30 days supply | Qty: 60 | Fill #8

## 2019-09-27 NOTE — Discharge Summary (Signed)
Physician Discharge Summary  Sabrina Mitchell LKG:[id number] DOB: 09/10/1961 DOA: 09/26/2019  PCP: Ladell Pier, MD  Admit date: 09/26/2019 Discharge date: 09/27/2019  Admitted From: Home Disposition: Home  Recommendations for Outpatient Follow-up:  1. Follow up with PCP in 1-2 weeks 2. Follow-up for outpatient hemodialysis 3. Please obtain BMP/CBC in one week 4. Please follow up on the following pending results:  Home Health: None Equipment/Devices: None  Discharge Condition: Stable CODE STATUS: Full code Diet recommendation: Cardiac/renal  Subjective: Patient seen and examined.  She feels much better.  No chest pain or shortness of breath.  Walking around in the room without any dyspnea.  Looks comfortable.  Brief/Interim Summary: Sabrina Mitchell is a 58 y.o. female with medical history significant of ESRD on HD, hypertension, anxiety, depression, and GERD who presented to ED on 09/26/2019 with complaints of chest pain and shortness of breath.Symptoms initially started around 6 AM on 09/25/2019.  She complained of substernal chest pain that she describes as throbbing.   Associated symptoms included intermittent night sweats over the last week, nausea, leg swelling, and a dry cough.  Denied fever, recent sick contacts, vomiting, diarrhea, or loss of consciousness.  She took her home blood pressure medications with some mild improvement.  However, symptoms recurred on the morning of 09/26/2019 For which she called EMS.  She had not missed any hemodialysis sessions.   En route with EMS patient's blood pressure was elevated up to 200/100. She was given full dose aspirin and 2 nitroglycerin with improvement of blood pressure down to 138/90 .  Upon arrival to the ED, patient was found to be afebrile, pulse 73-1 03, respirations 11-24, blood pressure 138/88-161/90, and O2 saturations 90-100% on 2 L nasal cannula oxygen.  Labs significant for WBC 4.9, hemoglobin 11.9, BUN 46,  creatinine 9.87, anion gap 17, and troponin 20->57.  COVID-19 screening negative.  Chest x-ray showing enlarged cardiac silhouette with pulmonary vascular congestion and probable diffuse pulmonary edema.    Patient was admitted under [place]  with the diagnosis of acute hypoxic respiratory failure secondary to volume overload/pulmonary edema as well as chest pain.  Nephrology was consulted and she underwent hemodialysis.  Slightly elevated troponins which were secondary to demand ischemia.  She is feeling much better today.  No more chest pain or shortness of breath and she is been walking around in the hall/room without having any dyspnea or requiring any oxygen.  She has been cleared by nephrology to be discharged.  She is hemodynamically stable so she will be discharged and she will follow-up with her PCP and outpatient dialysis.  Her blood pressure is very well controlled at this point in time as well.  Discharge Diagnoses:  Active Problems:   End stage renal disease on dialysis [facility])   Hypertension   Pulmonary edema   Chest pain   Elevated troponin   Acute respiratory failure with hypoxia [facility])    Discharge Instructions  Discharge Instructions    Discharge patient   Complete by: As directed    Discharge disposition: 01-Home or Self Care   Discharge patient date: 09/27/2019     Allergies as of 09/27/2019      Reactions   Amitriptyline Other (See Comments)   Makes her shake   Trazodone And Nefazodone Other (See Comments)   Makes her shake      Medication List    TAKE these medications   acetaminophen 325 MG tablet Commonly known as: TYLENOL Take 650 mg by mouth every 6 (  six) hours as needed for mild pain.   amLODipine 10 MG tablet Commonly known as: NORVASC Take 1 tablet (10 mg total) by mouth at bedtime. What changed:   how much to take  when to take this   carvedilol 6.25 MG tablet Commonly known as: COREG Take 1 tablet (6.25 mg total) by mouth at bedtime.   ethyl  chloride spray APPLY 1 APPLICATION TOPICALLY AS NEEDED (FOR DIALYSIS).   hydrOXYzine 25 MG tablet Commonly known as: ATARAX/VISTARIL Take 0.5 tablets (12.5 mg total) by mouth at bedtime as needed (insomnia and itching).   omeprazole 20 MG capsule Commonly known as: PRILOSEC Take 1 capsule (20 mg total) by mouth daily. 1 PO 30 mins prior to breakfast and supper   oxyCODONE 5 MG immediate release tablet Commonly known as: Roxicodone Take 1 tablet (5 mg total) by mouth every 4 (four) hours as needed.   sevelamer carbonate 800 MG tablet Commonly known as: RENVELA Take 800 mg by mouth 3 (three) times daily with meals.      Follow-up Information    Ladell Pier, MD Follow up in 1 week(s).   Specialty: Internal Medicine Contact information: [address] [place] [address] [phone]          Allergies  Allergen Reactions  . Amitriptyline Other (See Comments)    Makes her shake  . Trazodone And Nefazodone Other (See Comments)    Makes her shake    Consultations: Nephrology   Procedures/Studies: Dg Chest Portable 1 View  Result Date: 09/26/2019 CLINICAL DATA:  Chest pain for 1 day, cough, elevated blood pressure, history end-stage renal disease on dialysis, hypertension EXAM: PORTABLE CHEST 1 VIEW COMPARISON:  Portable exam 0740 hours compared to 04/01/2010 FINDINGS: Enlargement of cardiac silhouette with pulmonary vascular congestion. Mediastinal contours normal. Diffuse interstitial infiltrates favoring pulmonary edema over atypical infection. No pleural effusion or pneumothorax. IMPRESSION: Enlargement of cardiac silhouette with pulmonary vascular congestion and diffuse probable pulmonary edema. Electronically Signed   By: Lavonia Dana M.D.   On: 09/26/2019 08:07      Discharge Exam: Vitals:   09/26/19 2253 09/27/19 0825  BP: (!) 143/80 (!) 150/81  Pulse: 86 82  Resp:  16  Temp: 98.6 F (37 C) 98.6 F (37 C)  SpO2: 100% 97%   Vitals:   09/26/19  1848 09/26/19 2150 09/26/19 2253 09/27/19 0825  BP: (!) 158/85 (!) 141/77 (!) 143/80 (!) 150/81  Pulse: 94 85 86 82  Resp: (!) 30 18  16   Temp: 97.8 F (36.6 C) 98.5 F (36.9 C) 98.6 F (37 C) 98.6 F (37 C)  TempSrc: Oral Oral Oral Oral  SpO2: 100% 97% 100% 97%  Weight: 61.2 kg       General: Pt is alert, awake, not in acute distress Cardiovascular: RRR, S1/S2 +, no rubs, no gallops Respiratory: CTA bilaterally, no wheezing, no rhonchi Abdominal: Soft, NT, ND, bowel sounds + Extremities: no edema, no cyanosis    The results of significant diagnostics from this hospitalization (including imaging, microbiology, ancillary and laboratory) are listed below for reference.     Microbiology: Recent Results (from the past 240 hour(s))  SARS Coronavirus 2 [facility] order, Performed in [facility] hospital lab) Nasopharyngeal Nasopharyngeal Swab     Status: None   Collection Time: 09/26/19  8:02 AM   Specimen: Nasopharyngeal Swab  Result Value Ref Range Status   SARS Coronavirus 2 NEGATIVE NEGATIVE Final    Comment: (NOTE) If result is NEGATIVE SARS-CoV-2 target  nucleic acids are NOT DETECTED. The SARS-CoV-2 RNA is generally detectable in upper and lower  respiratory specimens during the acute phase of infection. The lowest  concentration of SARS-CoV-2 viral copies this assay can detect is 250  copies / mL. A negative result does not preclude SARS-CoV-2 infection  and should not be used as the sole basis for treatment or other  patient management decisions.  A negative result may occur with  improper specimen collection / handling, submission of specimen other  than nasopharyngeal swab, presence of viral mutation(s) within the  areas targeted by this assay, and inadequate number of viral copies  (<250 copies / mL). A negative result must be combined with clinical  observations, patient history, and epidemiological information. If result is POSITIVE SARS-CoV-2 target nucleic acids  are DETECTED. The SARS-CoV-2 RNA is generally detectable in upper and lower  respiratory specimens dur ing the acute phase of infection.  Positive  results are indicative of active infection with SARS-CoV-2.  Clinical  correlation with patient history and other diagnostic information is  necessary to determine patient infection status.  Positive results do  not rule out bacterial infection or co-infection with other viruses. If result is PRESUMPTIVE POSTIVE SARS-CoV-2 nucleic acids MAY BE PRESENT.   A presumptive positive result was obtained on the submitted specimen  and confirmed on repeat testing.  While 2019 novel coronavirus  (SARS-CoV-2) nucleic acids may be present in the submitted sample  additional confirmatory testing may be necessary for epidemiological  and / or clinical management purposes  to differentiate between  SARS-CoV-2 and other Sarbecovirus currently known to infect humans.  If clinically indicated additional testing with an alternate test  methodology [phone]) is advised. The SARS-CoV-2 RNA is generally  detectable in upper and lower respiratory sp ecimens during the acute  phase of infection. The expected result is Negative. Fact Sheet for Patients:  StrictlyIdeas.no Fact Sheet for Healthcare Providers: BankingDealers.co.za This test is not yet approved or cleared by the [place] FDA and has been authorized for detection and/or diagnosis of SARS-CoV-2 by FDA under an Emergency Use Authorization (EUA).  This EUA will remain in effect (meaning this test can be used) for the duration of the COVID-19 declaration under Section 564(b)(1) of the Act, 21 U.S.C. section 360bbb-3(b)(1), unless the authorization is terminated or revoked sooner. Performed at [facility], [place] [address], [place], [place] [address]      Labs: BNP (last 3 results) No results for input(s): BNP in the last 8760 hours. Basic  Metabolic Panel: Recent Labs  Lab 09/26/19 0702 09/27/19 0537  NA 139 138  K 3.8 4.4  CL 99 98  CO2 23 25  GLUCOSE 107* 83  BUN 46* 19  CREATININE 9.87* 6.59*  CALCIUM 10.0 10.2  PHOS  --  6.1*   Liver Function Tests: Recent Labs  Lab 09/26/19 0702 09/27/19 0537  AST 39  --   ALT 33  --   ALKPHOS 88  --   BILITOT 0.6  --   PROT 6.9  --   ALBUMIN 3.7 3.4*   No results for input(s): LIPASE, AMYLASE in the last 168 hours. No results for input(s): AMMONIA in the last 168 hours. CBC: Recent Labs  Lab 09/26/19 0702 09/27/19 0537  WBC 4.9 8.2  NEUTROABS 3.4  --   HGB 11.9* 11.1*  HCT 35.8* 34.7*  MCV 90.6 90.6  PLT 170 179   Cardiac Enzymes: No results for input(s): CKTOTAL, CKMB, CKMBINDEX, TROPONINI  in the last 168 hours. BNP: Invalid input(s): POCBNP CBG: No results for input(s): GLUCAP in the last 168 hours. D-Dimer No results for input(s): DDIMER in the last 72 hours. Hgb A1c No results for input(s): HGBA1C in the last 72 hours. Lipid Profile No results for input(s): CHOL, HDL, LDLCALC, TRIG, CHOLHDL, LDLDIRECT in the last 72 hours. Thyroid function studies No results for input(s): TSH, T4TOTAL, T3FREE, THYROIDAB in the last 72 hours.  Invalid input(s): FREET3 Anemia work up No results for input(s): VITAMINB12, FOLATE, FERRITIN, TIBC, IRON, RETICCTPCT in the last 72 hours. Urinalysis    Component Value Date/Time   COLORURINE YELLOW 07/24/2017 2338   APPEARANCEUR Clear 03/17/2018 0920   LABSPEC 1.007 07/24/2017 2338   PHURINE 9.0 (H) 07/24/2017 2338   GLUCOSEU Trace (A) 03/17/2018 0920   HGBUR NEGATIVE 07/24/2017 2338   BILIRUBINUR Negative 03/17/2018 0920   KETONESUR NEGATIVE 07/24/2017 2338   PROTEINUR 2+ (A) 03/17/2018 0920   PROTEINUR >=300 (A) 07/24/2017 2338   UROBILINOGEN 0.2 02/18/2012 1850   NITRITE Negative 03/17/2018 0920   NITRITE NEGATIVE 07/24/2017 2338   LEUKOCYTESUR Trace (A) 03/17/2018 0920   Sepsis Labs Invalid input(s):  PROCALCITONIN,  WBC,  LACTICIDVEN Microbiology Recent Results (from the past 240 hour(s))  SARS Coronavirus 2 [facility] order, Performed in [facility] hospital lab) Nasopharyngeal Nasopharyngeal Swab     Status: None   Collection Time: 09/26/19  8:02 AM   Specimen: Nasopharyngeal Swab  Result Value Ref Range Status   SARS Coronavirus 2 NEGATIVE NEGATIVE Final    Comment: (NOTE) If result is NEGATIVE SARS-CoV-2 target nucleic acids are NOT DETECTED. The SARS-CoV-2 RNA is generally detectable in upper and lower  respiratory specimens during the acute phase of infection. The lowest  concentration of SARS-CoV-2 viral copies this assay can detect is 250  copies / mL. A negative result does not preclude SARS-CoV-2 infection  and should not be used as the sole basis for treatment or other  patient management decisions.  A negative result may occur with  improper specimen collection / handling, submission of specimen other  than nasopharyngeal swab, presence of viral mutation(s) within the  areas targeted by this assay, and inadequate number of viral copies  (<250 copies / mL). A negative result must be combined with clinical  observations, patient history, and epidemiological information. If result is POSITIVE SARS-CoV-2 target nucleic acids are DETECTED. The SARS-CoV-2 RNA is generally detectable in upper and lower  respiratory specimens dur ing the acute phase of infection.  Positive  results are indicative of active infection with SARS-CoV-2.  Clinical  correlation with patient history and other diagnostic information is  necessary to determine patient infection status.  Positive results do  not rule out bacterial infection or co-infection with other viruses. If result is PRESUMPTIVE POSTIVE SARS-CoV-2 nucleic acids MAY BE PRESENT.   A presumptive positive result was obtained on the submitted specimen  and confirmed on repeat testing.  While 2019 novel coronavirus  (SARS-CoV-2)  nucleic acids may be present in the submitted sample  additional confirmatory testing may be necessary for epidemiological  and / or clinical management purposes  to differentiate between  SARS-CoV-2 and other Sarbecovirus currently known to infect humans.  If clinically indicated additional testing with an alternate test  methodology [id number]) is advised. The SARS-CoV-2 RNA is generally  detectable in upper and lower respiratory sp ecimens during the acute  phase of infection. The expected result is Negative. Fact Sheet for Patients:  StrictlyIdeas.no Fact Sheet  for Healthcare Providers: BankingDealers.co.za This test is not yet approved or cleared by the [place] and has been authorized for detection and/or diagnosis of SARS-CoV-2 by FDA under an Emergency Use Authorization (EUA).  This EUA will remain in effect (meaning this test can be used) for the duration of the COVID-19 declaration under Section 564(b)(1) of the Act, 21 U.S.C. section 360bbb-3(b)(1), unless the authorization is terminated or revoked sooner. Performed at [facility], [place] [address] [place], [place] [address]      Time coordinating discharge: Over 30 minutes  SIGNED:   Darliss Cheney, MD  [facility] 09/27/2019, 10:25 AM Pager [phone]  If 7PM-7AM, please contact night-coverage [email or web address] Password [email or web address]

## 2019-09-27 NOTE — Care Management (Signed)
Pt is active with [facility] and gets prescriptions filled there.  Pts daughter will pick pt up from hospital at discharge.  Per pts daughter - pt does not have barriers with food, housing nor transportation.  CM signing off

## 2019-09-27 NOTE — Progress Notes (Signed)
[facility] Progress Note  Subjective: feeling much better, CP gone and SOB resolved.  Today's stand wt = 58 kg.   Vitals:   09/26/19 1848 09/26/19 2150 09/26/19 2253 09/27/19 0825  BP: (!) 158/85 (!) 141/77 (!) 143/80 (!) 150/81  Pulse: 94 85 86 82  Resp: (!) 30 18  16   Temp: 97.8 F (36.6 C) 98.5 F (36.9 C) 98.6 F (37 C) 98.6 F (37 C)  TempSrc: Oral Oral Oral Oral  SpO2: 100% 97% 100% 97%  Weight: 61.2 kg       Inpatient medications: . amLODipine  5 mg Oral BID  . carvedilol  6.25 mg Oral QHS  . Chlorhexidine Gluconate Cloth  6 each Topical Q0600  . heparin  5,000 Units Subcutaneous Q8H  . pantoprazole  40 mg Oral Daily  . sevelamer carbonate  800 mg Oral TID WC  . sodium chloride flush  3 mL Intravenous Q12H    acetaminophen **OR** acetaminophen, albuterol, HYDROmorphone (DILAUDID) injection, hydrOXYzine    Exam: Gen alert, no distres, nasal O2 Chest clear bilat no rales/ wheezing RRR no MRG Abd soft ntnd no mass or ascites +bs Ext no sig LE edema Neuro is alert, Ox 3 , nf   LUA AVF+bruit    Home meds:  - amlodipine 10/ carvedilol 6.25 hs  - oemprazole 20 qd  - oxycodone qid prn 5mg   - sevelamer carb ac tid    [facility]pt [facility]  3.5h  60.5kg  (coming off below dry)  2/2 bath 350/1.5  Hep 4000  LUA AVF    CXR 9/29 - pulm edema and vasc congestion   Assessment/ Plan: 1. Chest pain - CP and SOB resolved w/ HD yesterday. Slight trop up likely due to strain from vol overload. On Room air today. 58kg standing this am.  OK for dc this am, have d/w primary. Will lower dry to 56.5 starting at OP HD tomorrow then lower further another 1.5 - 2 kg over next 1-2 wks as tolerated.  2. Pulm edema - vol overload , losing body wt.  3. ESRD - on HD [facility].  4. HTN - cont meds 5. Anemia ckd - Hb 11.9, follow       Rob Fahim Kats 09/27/2019, 11:37 AM  Iron/TIBC/Ferritin/ %Sat No results found for: IRON, TIBC, FERRITIN, IRONPCTSAT Recent  Labs  Lab 09/27/19 0537  NA 138  K 4.4  CL 98  CO2 25  GLUCOSE 83  BUN 19  CREATININE 6.59*  CALCIUM 10.2  PHOS 6.1*  ALBUMIN 3.4*   Recent Labs  Lab 09/26/19 0702  AST 39  ALT 33  ALKPHOS 88  BILITOT 0.6  PROT 6.9   Recent Labs  Lab 09/27/19 0537  WBC 8.2  HGB 11.1*  HCT 34.7*  PLT 179

## 2019-09-27 NOTE — Plan of Care (Signed)
  Problem: Activity: Goal: Risk for activity intolerance will decrease Outcome: Progressing   Problem: Nutrition: Goal: Adequate nutrition will be maintained Outcome: Progressing   Problem: Pain Managment: Goal: General experience of comfort will improve Outcome: Progressing   Problem: Education: Goal: Knowledge of General Education information will improve Description: Including pain rating scale, medication(s)/side effects and non-pharmacologic comfort measures Outcome: Not Progressing   Problem: Health Behavior/Discharge Planning: Goal: Ability to manage health-related needs will improve Outcome: Not Progressing

## 2019-09-27 NOTE — Discharge Instructions (Signed)
Chronic Kidney Disease, Adult °Chronic kidney disease (CKD) happens when the kidneys are damaged over a long period of time. The kidneys are two organs that help with: °· Getting rid of waste and extra fluid from the blood. °· Making hormones that maintain the amount of fluid in your tissues and blood vessels. °· Making sure that the body has the right amount of fluids and chemicals. °Most of the time, CKD does not go away, but it can usually be controlled. Steps must be taken to slow down the kidney damage or to stop it from getting worse. If this is not done, the kidneys may stop working. °Follow these instructions at home: °Medicines °· Take over-the-counter and prescription medicines only as told by your doctor. You may need to change the amount of medicines you take. °· Do not take any new medicines unless your doctor says it is okay. Many medicines can make your kidney damage worse. °· Do not take any vitamin and supplements unless your doctor says it is okay. Many vitamins and supplements can make your kidney damage worse. °General instructions °· Follow a diet as told by your doctor. You may need to stay away from: °? Alcohol. °? Salty foods. °? Foods that are high in: °§ Potassium. °§ Calcium. °§ Protein. °· Do not use any products that contain nicotine or tobacco, such as cigarettes and e-cigarettes. If you need help quitting, ask your doctor. °· Keep track of your blood pressure at home. Tell your doctor about any changes. °· If you have diabetes, keep track of your blood sugar as told by your doctor. °· Try to stay at a healthy weight. If you need help, ask your doctor. °· Exercise at least 30 minutes a day, 5 days a week. °· Stay up-to-date with your shots (immunizations) as told by your doctor. °· Keep all follow-up visits as told by your doctor. This is important. °Contact a doctor if: °· Your symptoms get worse. °· You have new symptoms. °Get help right away if: °· You have symptoms of end-stage  kidney disease. These may include: °? Headaches. °? Numbness in your hands or feet. °? Easy bruising. °? Having hiccups often. °? Chest pain. °? Shortness of breath. °? Stopping of menstrual periods in women. °· You have a fever. °· You have very little pee (urine). °· You have pain or bleeding when you pee. °Summary °· Chronic kidney disease (CKD) happens when the kidneys are damaged over a long period of time. °· Most of the time, this condition does not go away, but it can usually be controlled. Steps must be taken to slow down the kidney damage or to stop it from getting worse. °· Treatment may include a combination of medicines and lifestyle changes. °This information is not intended to replace advice given to you by your health care provider. Make sure you discuss any questions you have with your health care provider. °Document Released: 03/10/2010 Document Revised: 11/26/2017 Document Reviewed: 01/18/2017 °Elsevier Patient Education © 2020 [employer] ° °

## 2019-10-04 ENCOUNTER — Ambulatory Visit: Payer: Self-pay

## 2019-10-09 ENCOUNTER — Other Ambulatory Visit: Payer: Self-pay

## 2019-10-09 ENCOUNTER — Encounter (HOSPITAL_COMMUNITY): Payer: Self-pay | Admitting: Emergency Medicine

## 2019-10-09 ENCOUNTER — Ambulatory Visit (HOSPITAL_COMMUNITY)
Admission: EM | Admit: 2019-10-09 | Discharge: 2019-10-09 | Disposition: A | Discharge status: Home / Self Care | Payer: Self-pay | Attending: Family Medicine | Admitting: Family Medicine

## 2019-10-09 DIAGNOSIS — R531 Weakness: Secondary | ICD-10-CM

## 2019-10-09 DIAGNOSIS — Z20822 Contact with and (suspected) exposure to covid-19: Secondary | ICD-10-CM

## 2019-10-09 NOTE — ED Provider Notes (Signed)
[place]    CSN: [id number] Arrival date & time: 10/09/19  1514      History   Chief Complaint Chief Complaint  Patient presents with  . Fatigue    HPI Sabrina Mitchell is a 58 y.o. female.   HPI  History is obtained with the assistance of a Patent attorney.  Even with this assistance, obtaining history is difficult.  She states that she is weak.  He states she is fatigued.  She states that she has dialysis tomorrow, but she does not think she would make it overnight to her dialysis appointment.  She states she cannot eat.  She has only had a few fluids today.  She can give me no reason for not eating, she states she is hungry, but that she cannot eat.  She states she has no nausea.  No vomiting.  No abdominal pain.  She has not tried to eat.  She states she just knows it will not help.  She did eat some yesterday. She thinks her potassium was low.  She is requesting blood work. Besides her generalized weakness, fatigue, and poor appetite she has no fever or chills, no cough shortness of breath or chest discomfort.  Had a normal bowel movement yesterday, small bowel movement today.  No rash.  No known exposure to illness. Chart is reviewed.  Patient was just hospitalized, leaving the hospital 4 days ago.  She is supposed to have a CBC and CMP in 1 week.  It is only been 4 days.  Past Medical History:  Diagnosis Date  . Anemia   . Anxiety   . Complication of anesthesia   . Depression    denies  . End stage renal disease ([place])    hemodialysis 3x/wk @ [address]  . Family history of adverse reaction to anesthesia    sister vomiting after uterine cyst removal  . Gastritis   . GERD (gastroesophageal reflux disease)   . Hemorrhoids   . Hypertension   . Pneumonia    years ago  . PONV (postoperative nausea and vomiting)   . UTI (lower urinary tract infection) March 2014    Patient Active Problem List   Diagnosis Date Noted  . Acute  respiratory failure with hypoxia ([place]) 09/27/2019  . Pulmonary edema 09/26/2019  . Chest pain 09/26/2019  . Elevated troponin 09/26/2019  . Calcaneal spur of right foot 10/10/2018  . 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 GERD (gastroesophageal reflux disease) 01/17/2016  . 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 Post-menopausal bleeding 04/06/2012  . End stage renal disease on dialysis ([place]) 04/06/2012  . Hypertension 04/06/2012    Past Surgical History:  Procedure Laterality Date  . AV FISTULA PLACEMENT Left 07/05/09   Dr. Kellie Simmering  . COLONOSCOPY N/A 02/10/2016   SLF: 1. HEME postive stool due to colon polyps intrernal hemorrhoids 2. small internal hemrrohoids 3. moderate external hemorrhoids.   . ESOPHAGEAL DILATION N/A 02/10/2016   Procedure: ESOPHAGEAL DILATION;  Surgeon: Danie Binder, MD;  [facility];  Service: Endoscopy;  Laterality: N/A;  . ESOPHAGOGASTRODUODENOSCOPY N/A 02/10/2016   SLF: patent Schatzki's ring , mild gastritis/ duodentitis.   Marland Kitchen FISTULA SUPERFICIALIZATION Left 94/06/6545   Procedure: PLICATION BRACHIOCEPHALIC FISTULA;  Surgeon: Angelia Mould, MD;  Location: [facility];  Service: Vascular;  Laterality: Left;  . FISTULOGRAM N/A 10/22/2014   Procedure: FISTULOGRAM;  Surgeon: Angelia Mould, MD;  Location: [facility] CATH LAB;  Service: Cardiovascular;  Laterality: N/A;  . IR DIALY SHUNT INTRO NEEDLE/INTRACATH INITIAL W/IMG LEFT Left 02/01/2019  . PERIPHERAL VASCULAR CATHETERIZATION Left 12/09/2016   Procedure: A/V Fistulagram;  Surgeon: Waynetta Sandy, MD;  Location: [place] CV LAB;  Service: Cardiovascular;  Laterality: Left;  . REVISON OF ARTERIOVENOUS FISTULA Left 00/92/3300   Procedure: PLICATION OF BRACHIOCEPHALIC FISTULA;  Surgeon: Conrad Lakewood Club, MD;  Location: [place];  Service: Vascular;  Laterality: Left;  . REVISON OF ARTERIOVENOUS FISTULA Left 08/09/2017   Procedure: REVISION OF  ARTERIOVENOUS FISTULA LIGATION OF SIDE BRANCH;  Surgeon: Waynetta Sandy, MD;  Location: [place];  Service: Vascular;  Laterality: Left;  . TUBAL LIGATION  2004    OB History    Gravida  5   Para      Term      Preterm      AB  3   Living  2     SAB  3   TAB      Ectopic      Multiple      Live Births  2            Home Medications    Prior to Admission medications   Medication Sig Start Date End Date Taking? Authorizing Provider  acetaminophen (TYLENOL) 325 MG tablet Take 650 mg by mouth every 6 (six) hours as needed for mild pain.     [provider]  amLODipine (NORVASC) 10 MG tablet Take 1 tablet (10 mg total) by mouth at bedtime. Patient taking differently: Take 5 mg by mouth 2 (two) times daily.  10/10/18   Ladell Pier, MD  B Complex-C-Folic Acid (DIALYVITE 762) 0.8 MG TABS Take 1 tablet by mouth daily. 09/06/19   [provider]  carvedilol (COREG) 6.25 MG tablet Take 1 tablet (6.25 mg total) by mouth at bedtime. 10/10/18   Ladell Pier, MD  ethyl chloride spray APPLY 1 APPLICATION TOPICALLY AS NEEDED (FOR DIALYSIS). 02/17/19   Ladell Pier, MD  Lanthanum Carbonate (FOSRENOL) 750 MG PACK Take 750 mg by mouth 3 (three) times daily with meals. 05/06/19   [provider]  omeprazole (PRILOSEC) 20 MG capsule Take 1 capsule (20 mg total) by mouth daily. 1 PO 30 mins prior to breakfast and supper Patient taking differently: Take 20 mg by mouth daily as needed (acid reflux).  08/06/17   Ladell Pier, MD    Family History Family History  Problem Relation Age of Onset  . Hypertension Mother   . Hypertension Father   . Hypertension Sister   . Breast cancer Sister 60  . Diabetes Brother   . Hypertension Brother     Social History Social History   Tobacco Use  . Smoking status: Never Smoker  . Smokeless tobacco: Never Used  Substance Use Topics  . Alcohol use: No  . Drug use: No     Allergies    Amitriptyline and Trazodone and nefazodone   Review of Systems Review of Systems  Constitutional: Positive for appetite change and fatigue. Negative for chills and fever.  HENT: Negative for congestion, ear pain and sore throat.   Eyes: Negative for pain and visual disturbance.  Respiratory: Negative for cough and shortness of breath.   Cardiovascular: Negative for chest pain and palpitations.  Gastrointestinal: Negative for abdominal pain, constipation, diarrhea, nausea and vomiting.  Genitourinary: Negative for dysuria and hematuria.  Musculoskeletal: Negative for arthralgias and back pain.  Skin: Negative for color change  and rash.  Neurological: Positive for weakness. Negative for seizures and syncope.  All other systems reviewed and are negative.    Physical Exam Triage Vital Signs ED Triage Vitals [10/09/19 1614]  Enc Vitals Group     BP 137/75     Pulse Rate 75     Resp 18     Temp 98 F (36.7 C)     Temp Source Temporal     SpO2 99 %     Weight      Height      Head Circumference      Peak Flow      Pain Score 0     Pain Loc      Pain Edu?      Excl. in [place]?    No data found.  Updated Vital Signs BP 137/75 (BP Location: Right Arm)   Pulse 75   Temp 98 F (36.7 C) (Temporal)   Resp 18   LMP 02/11/2012 Comment: tubal ligation  SpO2 99%   Visual Acuity Right Eye Distance:   Left Eye Distance:   Bilateral Distance:    Right Eye Near:   Left Eye Near:    Bilateral Near:     Physical Exam Constitutional:      General: She is not in acute distress.    Appearance: She is well-developed. She is ill-appearing.     Comments: Appears tired.  Appears ill  HENT:     Head: Normocephalic and atraumatic.  Eyes:     Conjunctiva/sclera: Conjunctivae normal.     Pupils: Pupils are equal, round, and reactive to light.  Neck:     Musculoskeletal: Normal range of motion.  Cardiovascular:     Rate and Rhythm: Normal rate and regular rhythm.     Heart sounds:  Murmur present.     Comments: Prominent systolic murmur Pulmonary:     Effort: Pulmonary effort is normal. No respiratory distress.     Breath sounds: No wheezing or rales.  Abdominal:     General: Abdomen is flat. There is no distension.     Palpations: Abdomen is soft.     Tenderness: There is no abdominal tenderness.  Musculoskeletal: Normal range of motion.  Skin:    General: Skin is warm and dry.  Neurological:     Mental Status: She is alert.      UC Treatments / Results  Labs (all labs ordered are listed, but only abnormal results are displayed) Labs Reviewed  COMPREHENSIVE METABOLIC PANEL  CBC    EKG   Radiology No results found.  Procedures Procedures (including critical care time)  Medications Ordered in UC Medications - No data to display  Initial Impression / Assessment and Plan / UC Course  I have reviewed the triage vital signs and the nursing notes.  Pertinent labs & imaging results that were available during my care of the patient were reviewed by me and considered in my medical decision making (see chart for details).     Patient's vital signs are stable.  Her murmur is longstanding.  No other physical exam findings or historical findings to point towards a diagnosis.  She needs laboratory work.  I am unable to accomplish this in our office today, she will not allow blood draw by my staff. Final Clinical Impressions(s) / UC Diagnoses   Final diagnoses:  Weakness     Discharge Instructions     We are unable to draw the blood work that you need You  just left the hospital 4 days ago.  You need to go back to the emergency room for evaluation of your weakness   ED Prescriptions    None     PDMP not reviewed this encounter.   Raylene Everts, MD 10/09/19 [phone]

## 2019-10-09 NOTE — Discharge Instructions (Signed)
We are unable to draw the blood work that you need You just left the hospital 4 days ago.  You need to go back to the emergency room for evaluation of your weakness

## 2019-10-09 NOTE — ED Triage Notes (Signed)
Pt sts generalized weakness and fatigue; pt is dialysis pt with last treatment Saturday; pt denies fever

## 2019-10-10 LAB — NOVEL CORONAVIRUS, NAA: SARS-CoV-2, NAA: DETECTED — AB

## 2019-10-11 ENCOUNTER — Emergency Department (HOSPITAL_COMMUNITY): Payer: Self-pay

## 2019-10-11 ENCOUNTER — Encounter (HOSPITAL_COMMUNITY): Payer: Self-pay | Admitting: Emergency Medicine

## 2019-10-11 ENCOUNTER — Other Ambulatory Visit: Payer: Self-pay

## 2019-10-11 ENCOUNTER — Telehealth: Payer: Self-pay | Admitting: Internal Medicine

## 2019-10-11 ENCOUNTER — Emergency Department (HOSPITAL_COMMUNITY)
Admission: EM | Admit: 2019-10-11 | Discharge: 2019-10-11 | Disposition: A | Discharge status: Home / Self Care | Payer: Self-pay | Attending: Emergency Medicine | Admitting: Emergency Medicine

## 2019-10-11 DIAGNOSIS — U071 COVID-19: Secondary | ICD-10-CM | POA: Insufficient documentation

## 2019-10-11 DIAGNOSIS — N186 End stage renal disease: Secondary | ICD-10-CM | POA: Insufficient documentation

## 2019-10-11 DIAGNOSIS — I12 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 disease: Secondary | ICD-10-CM | POA: Insufficient documentation

## 2019-10-11 DIAGNOSIS — Z79899 Other long term (current) drug therapy: Secondary | ICD-10-CM | POA: Insufficient documentation

## 2019-10-11 DIAGNOSIS — Z992 Dependence on renal dialysis: Secondary | ICD-10-CM | POA: Insufficient documentation

## 2019-10-11 DIAGNOSIS — R630 Anorexia: Secondary | ICD-10-CM | POA: Insufficient documentation

## 2019-10-11 LAB — CBC
HCT: 36.6 % (ref 36.0–46.0)
Hemoglobin: 11.7 g/dL — ABNORMAL LOW (ref 12.0–15.0)
MCH: 29 pg (ref 26.0–34.0)
MCHC: 32 g/dL (ref 30.0–36.0)
MCV: 90.6 fL (ref 80.0–100.0)
Platelets: 113 10*3/uL — ABNORMAL LOW (ref 150–400)
RBC: 4.04 MIL/uL (ref 3.87–5.11)
RDW: 15.4 % (ref 11.5–15.5)
WBC: 3.9 10*3/uL — ABNORMAL LOW (ref 4.0–10.5)
nRBC: 0 % (ref 0.0–0.2)

## 2019-10-11 LAB — BASIC METABOLIC PANEL
Anion gap: 17 — ABNORMAL HIGH (ref 5–15)
BUN: 27 mg/dL — ABNORMAL HIGH (ref 6–20)
CO2: 30 mmol/L (ref 22–32)
Calcium: 9.3 mg/dL (ref 8.9–10.3)
Chloride: 92 mmol/L — ABNORMAL LOW (ref 98–111)
Creatinine, Ser: 8.41 mg/dL — ABNORMAL HIGH (ref 0.44–1.00)
GFR calc Af Amer: 6 mL/min — ABNORMAL LOW (ref >60)
GFR calc non Af Amer: 5 mL/min — ABNORMAL LOW (ref >60)
Glucose, Bld: 111 mg/dL — ABNORMAL HIGH (ref 70–99)
Potassium: 3.8 mmol/L (ref 3.5–5.1)
Sodium: 139 mmol/L (ref 135–145)

## 2019-10-11 LAB — I-STAT CHEM 8, ED
BUN: 32 mg/dL — ABNORMAL HIGH (ref 6–20)
Calcium, Ion: 1.02 mmol/L — ABNORMAL LOW (ref 1.15–1.40)
Chloride: 93 mmol/L — ABNORMAL LOW (ref 98–111)
Creatinine, Ser: 9.3 mg/dL — ABNORMAL HIGH (ref 0.44–1.00)
Glucose, Bld: 87 mg/dL (ref 70–99)
HCT: 34 % — ABNORMAL LOW (ref 36.0–46.0)
Hemoglobin: 11.6 g/dL — ABNORMAL LOW (ref 12.0–15.0)
Potassium: 3.5 mmol/L (ref 3.5–5.1)
Sodium: 137 mmol/L (ref 135–145)
TCO2: 31 mmol/L (ref 22–32)

## 2019-10-11 MED ORDER — SODIUM CHLORIDE 0.9% FLUSH: 3.0000 mL | Freq: Once | INTRAVENOUS | Status: DC

## 2019-10-11 NOTE — ED Triage Notes (Signed)
2 weeks ago she was admitted to the hospital for SOB and fluid overload. She has been going to dialysis but notices she has been loosing weight. Feels weak, feverish, lose of appetite and fatgiued for 8 days. Fever 100-102 has used tylenol. A/O x4 NAD at triage. Her Dr. Rozell Searing it may be her potassium or her thyroid.    Interpreter used.

## 2019-10-11 NOTE — ED Provider Notes (Signed)
[place] EMERGENCY DEPARTMENT Provider Note   CSN: [id number] Arrival date & time: 10/11/19  1431     History   Chief Complaint Chief Complaint  Patient presents with   Fatigue   Anorexia    HPI Sabrina Mitchell is a 58 y.o. female.  With end-stage renal disease who dialyzes Tuesday Thursday Saturday at [facility] center.  There is a language barrier and professional translation services are utilized.  The patient presents today with complaint of fatigue and weakness.  She states that she has no appetite.  She has a slight cough.  She denies nausea or vomiting.  She has had 2 episodes of loose stool.  She went to her full dialysis yesterday.  She has been having the symptoms for about 8 days.  Review of EMR shows that the patient's coronavirus test is positive.  She was unaware.  Apparently multiple attempts to call the patient failed.  She denies fevers.     HPI  Past Medical History:  Diagnosis Date   Anemia    Anxiety    Complication of anesthesia    Depression    denies   End stage renal disease ([place])    hemodialysis 3x/wk @ [place]   Family history of adverse reaction to anesthesia    sister vomiting after uterine cyst removal   Gastritis    GERD (gastroesophageal reflux disease)    Hemorrhoids    Hypertension    Pneumonia    years ago   PONV (postoperative nausea and vomiting)    UTI (lower urinary tract infection) March 2014    Patient Active Problem List   Diagnosis Date Noted   Acute respiratory failure with hypoxia ([place]) 09/27/2019   Pulmonary edema 09/26/2019   Chest pain 09/26/2019   Elevated troponin 09/26/2019   Calcaneal spur of right foot 10/10/2018   Ulnar neuropathy of left upper extremity 03/11/2018   GERD (gastroesophageal reflux disease) 01/17/2016   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 Kellie Simmering   COLONOSCOPY N/A 02/10/2016   SLF: 1. HEME postive stool due to colon polyps intrernal hemorrhoids 2. small internal hemrrohoids 3. moderate external hemorrhoids.    ESOPHAGEAL DILATION N/A 02/10/2016   Procedure: ESOPHAGEAL DILATION;  Surgeon: Danie Binder, MD;  [facility];  Service: Endoscopy;  Laterality: N/A;   ESOPHAGOGASTRODUODENOSCOPY N/A 02/10/2016   SLF: patent Schatzki's ring , mild gastritis/ duodentitis.    FISTULA SUPERFICIALIZATION Left 42/05/8340   Procedure: PLICATION BRACHIOCEPHALIC FISTULA;  Surgeon: Angelia Mould, MD;  Location: [place];  Service: Vascular;  Laterality: Left;   FISTULOGRAM N/A 10/22/2014   Procedure: FISTULOGRAM;  Surgeon: Angelia Mould, MD;  Location: [facility] CATH LAB;  Service: Cardiovascular;  Laterality: N/A;   IR DIALY SHUNT INTRO NEEDLE/INTRACATH INITIAL W/IMG LEFT Left 02/01/2019   PERIPHERAL VASCULAR CATHETERIZATION Left 12/09/2016   Procedure: A/V Fistulagram;  Surgeon: Waynetta Sandy, MD;  Location: [place] CV LAB;  Service: Cardiovascular;  Laterality: Left;   REVISON OF ARTERIOVENOUS FISTULA Left 96/22/2979   Procedure: PLICATION OF BRACHIOCEPHALIC FISTULA;  Surgeon: Conrad New Castle, MD;  Location: [place];  Service: Vascular;  Laterality: Left;   REVISON OF ARTERIOVENOUS FISTULA Left 08/09/2017   Procedure: REVISION OF ARTERIOVENOUS  FISTULA LIGATION OF SIDE BRANCH;  Surgeon: Waynetta Sandy, MD;  Location: [place];  Service: Vascular;  Laterality: Left;   TUBAL LIGATION  2004     OB History    Gravida  5   Para      Term      Preterm      AB  3   Living  2     SAB  3   TAB      Ectopic      Multiple      Live Births  2            Home Medications    Prior to Admission medications     Medication Sig Start Date End Date Taking? Authorizing Provider  acetaminophen (TYLENOL) 325 MG tablet Take 650 mg by mouth every 6 (six) hours as needed for mild pain.     [provider]  amLODipine (NORVASC) 10 MG tablet Take 1 tablet (10 mg total) by mouth at bedtime. Patient taking differently: Take 5 mg by mouth 2 (two) times daily.  10/10/18   Ladell Pier, MD  B Complex-C-Folic Acid (DIALYVITE 315) 0.8 MG TABS Take 1 tablet by mouth daily. 09/06/19   [provider]  carvedilol (COREG) 6.25 MG tablet Take 1 tablet (6.25 mg total) by mouth at bedtime. 10/10/18   Ladell Pier, MD  ethyl chloride spray APPLY 1 APPLICATION TOPICALLY AS NEEDED (FOR DIALYSIS). 02/17/19   Ladell Pier, MD  Lanthanum Carbonate (FOSRENOL) 750 MG PACK Take 750 mg by mouth 3 (three) times daily with meals. 05/06/19   [provider]  omeprazole (PRILOSEC) 20 MG capsule Take 1 capsule (20 mg total) by mouth daily. 1 PO 30 mins prior to breakfast and supper Patient taking differently: Take 20 mg by mouth daily as needed (acid reflux).  08/06/17   Ladell Pier, MD    Family History Family History  Problem Relation Age of Onset   Hypertension Mother    Hypertension Father    Hypertension Sister    Breast cancer Sister 41   Diabetes Brother    Hypertension Brother     Social History Social History   Tobacco Use   Smoking status: Never Smoker   Smokeless tobacco: Never Used  Substance Use Topics   Alcohol use: No   Drug use: No     Allergies   Amitriptyline and Trazodone and nefazodone   Review of Systems Review of Systems Ten systems reviewed and are negative for acute change, except as noted in the HPI.    Physical Exam Updated Vital Signs BP (!) 158/79    Pulse 71    Temp 98.1 F (36.7 C) (Oral)    Resp 12    Ht 5\' 2"  (1.575 m)    Wt 55 kg    LMP 02/11/2012 Comment: tubal ligation   SpO2 99%    BMI 22.18 kg/m   Physical Exam Vitals  signs and nursing note reviewed.  Constitutional:      General: She is not in acute distress.    Appearance: She is well-developed. She is not diaphoretic.  HENT:     Head: Normocephalic and atraumatic.  Eyes:     General: No scleral icterus.    Conjunctiva/sclera: Conjunctivae normal.  Neck:     Musculoskeletal: Normal range of motion.  Cardiovascular:     Rate and Rhythm: Normal rate and regular rhythm.     Heart sounds: Normal heart sounds.  No murmur. No friction rub. No gallop.      Comments: Well-developed AV fistula left upper extremity with palpable thrill Pulmonary:     Effort: Pulmonary effort is normal. No respiratory distress.     Breath sounds: Normal breath sounds.  Abdominal:     General: Bowel sounds are normal. There is no distension.     Palpations: Abdomen is soft. There is no mass.     Tenderness: There is no abdominal tenderness. There is no guarding.  Skin:    General: Skin is warm and dry.  Neurological:     Mental Status: She is alert and oriented to person, place, and time.  Psychiatric:        Behavior: Behavior normal.      ED Treatments / Results  Labs (all labs ordered are listed, but only abnormal results are displayed) Labs Reviewed  BASIC METABOLIC PANEL - Abnormal; Notable for the following components:      Result Value   Chloride 92 (*)    Glucose, Bld 111 (*)    BUN 27 (*)    Creatinine, Ser 8.41 (*)    GFR calc non Af Amer 5 (*)    GFR calc Af Amer 6 (*)    Anion gap 17 (*)    All other components within normal limits  CBC - Abnormal; Notable for the following components:   WBC 3.9 (*)    Hemoglobin 11.7 (*)    Platelets 113 (*)    All other components within normal limits  I-STAT CHEM 8, ED - Abnormal; Notable for the following components:   Chloride 93 (*)    BUN 32 (*)    Creatinine, Ser 9.30 (*)    Calcium, Ion 1.02 (*)    Hemoglobin 11.6 (*)    HCT 34.0 (*)    All other components within normal limits    EKG EKG  Interpretation  Date/Time:  Wednesday October 11 2019 17:23:40 EDT Ventricular Rate:  66 PR Interval:    QRS Duration: 100 QT Interval:  508 QTC Calculation: 533 R Axis:   67 Text Interpretation:  Sinus rhythm Left ventricular hypertrophy Prolonged QT interval Confirmed by Gerlene Fee [phone]) on 10/11/2019 6:18:11 PM   Radiology Dg Chest Port 1 View  Result Date: 10/11/2019 CLINICAL DATA:  Shortness of breath. EXAM: PORTABLE CHEST 1 VIEW COMPARISON:  09/26/2019 FINDINGS: Improved aeration in the lungs compared to the previous examination. Few interstitial densities in left lower chest but no frank pulmonary edema. Heart size remains enlarged. There is fullness in the right hilum. Indeterminate density at the left lung apex. Trachea is midline. No acute bone abnormality. IMPRESSION: Cardiomegaly with a few interstitial densities in left lower chest which could represent chronic changes or atelectasis. Negative for pulmonary edema. Indeterminate opacity along the left lung apex. This could represent overlying densities but indeterminate. Recommend follow-up examination of the chest with two views or further characterization with a chest CT. Fullness in the right hilum may be related to enlarged central vascular structures. Electronically Signed   By: Markus Daft M.D.   On: 10/11/2019 18:42    Procedures Procedures (including critical care time)  Medications Ordered in ED Medications  sodium chloride flush (NS) 0.9 % injection 3 mL (has no administration in time range)     Initial Impression / Assessment and Plan / ED Course  I have reviewed the triage vital signs and the nursing notes.  Pertinent labs & imaging results that were available during  my care of the patient were reviewed by me and considered in my medical decision making (see chart for details).        Patient here with complaint of weakness.  Review of EMR shows that she is positive for coronavirus.  The large  differential diagnosis for Ms. Angelique Holm makes this case of high complexity. The differential diagnosis of weakness includes but is not limited to neurologic causes (GBS, myasthenia gravis, CVA, MS, ALS, transverse myelitis, spinal cord injury, CVA, botulism, ) and other causes: ACS, Arrhythmia, syncope, orthostatic hypotension, sepsis, hypoglycemia, electrolyte disturbance, hypothyroidism, respiratory failure, symptomatic anemia, dehydration, heat injury, polypharmacy, malignancy.  After reviewing all the data points and imaging in this case I feel the patient's weakness is very likely related to her viral infection.  Her creatinine and BUN are at baseline.  White count 3.9..  Patient has a slight anion gap which I think is from her elevated BUN. I have spoken with the nephrologist on-call who states that they will call her tomorrow at home to get her set up to go to the appropriate dialysis center for COVID positive patients.  I also spoke with our infection preaccident prevention nurse liaison regarding the patient's use of dialysis center while positive for coronavirus over the past 8 days.  I answered all possible questions to the very best of my ability using professional translation services.  I gave the patient detailed return precautions including increased difficulty breathing, intractable fever even with the use of Tylenol, excessive diarrhea or vomiting that is uncontrolled.  Passing out. I have also gone over quarantine instructions with the patient.  Patient appears appropriate for discharge at this time.  She is hemodynamically stable and without hypoxia or fever. Final Clinical Impressions(s) / ED Diagnoses   Final diagnoses:  COVID-19 virus infection    ED Discharge Orders    None       Margarita Mail, PA-C 10/11/19 2042    Maudie Flakes, MD 10/17/19 [phone]

## 2019-10-11 NOTE — ED Notes (Signed)
The patient's last treatment was at [facility] on [address].

## 2019-10-11 NOTE — ED Notes (Signed)
I was notified by nurse navigator that this patients results are positive from [facility]. See labs. The dialysis center will be notified. I am unsure if the patient is aware.

## 2019-10-11 NOTE — Telephone Encounter (Signed)
-----   Message from Fenton Foy, NP sent at 10/11/2019  9:40 AM EDT ----- Called using Spanish interpreter and left message for patient to return call regarding lab results. Will notify Health Department.

## 2019-10-11 NOTE — Discharge Instructions (Addendum)
You have been diagnosed with COVID -19. You must stay at home and away from other people for 14 days from the day your symptoms started.  YOU WILL GET A CALL FROM THE [facility] TOMORROW TO TELL YOU WHERE TO GO FOR YOUR DIALYSIS! DO NOT GO TO YOUR REGULARLY SCHEDULED DIALYSIS AT [facility].  FOLLOW ALL OF THE DIRECTIONS IN THE INSTRUCTION PAPERS!

## 2019-10-11 NOTE — Telephone Encounter (Signed)
Attempt to call patient but husband states she is at the hospital. Patient currently in the ED.  Updated phone number that was in the system previously to number that is in there presently for future references.

## 2019-10-13 ENCOUNTER — Ambulatory Visit: Payer: Self-pay

## 2019-10-18 MED FILL — ETHYL CHLORIDE AERO: 30 days supply | Qty: 104 | Fill #0

## 2019-11-03 MED FILL — ?CARVEDILOL 6.25 MG TABLET: 6.25 | 30 days supply | Qty: 60 | Fill #2

## 2019-11-03 MED FILL — ?AMLODIPINE BESYLATE 5 MG T: 5 MG | 30 days supply | Qty: 60 | Fill #9

## 2019-11-03 MED FILL — ?OMEPRAZOLE 20MG CAP DR: 20 | 30 days supply | Qty: 30 | Fill #3

## 2019-12-01 MED FILL — ?AMLODIPINE BESYLATE 5 MG T: 5 MG | 30 days supply | Qty: 60 | Fill #10

## 2019-12-01 MED FILL — ETHYL CHLORIDE AERO: 30 days supply | Qty: 104 | Fill #1

## 2019-12-01 MED FILL — ?CARVEDILOL 6.25 MG TABLET: 6.25 | 30 days supply | Qty: 60 | Fill #3

## 2019-12-27 MED FILL — AMLODIPINE BESYLATE 5 MG TA: 5 | 30 days supply | Qty: 60 | Fill #11

## 2019-12-27 MED FILL — ?CARVEDILOL 6.25 MG TABLET: 6.25 | 30 days supply | Qty: 60 | Fill #4

## 2020-01-24 MED FILL — ETHYL CHLORIDE AERO: 30 days supply | Qty: 116 | Fill #2

## 2020-01-24 MED FILL — AMLODIPINE BESYLATE 5 MG TA: 5 | 30 days supply | Qty: 60 | Fill #0

## 2020-02-26 MED FILL — AMLODIPINE BESYLATE 5 MG TA: 5 | 30 days supply | Qty: 60 | Fill #1

## 2020-02-26 MED FILL — ?CARVEDILOL 6.25 MG TABLET: 6.25 | 30 days supply | Qty: 60 | Fill #5

## 2020-03-12 ENCOUNTER — Encounter (HOSPITAL_COMMUNITY): Payer: Self-pay | Admitting: Emergency Medicine

## 2020-03-12 ENCOUNTER — Other Ambulatory Visit: Payer: Self-pay

## 2020-03-12 ENCOUNTER — Emergency Department (HOSPITAL_COMMUNITY)
Admission: EM | Admit: 2020-03-12 | Discharge: 2020-03-13 | Disposition: A | Discharge status: Home / Self Care | Payer: Self-pay | Attending: Emergency Medicine | Admitting: Emergency Medicine

## 2020-03-12 DIAGNOSIS — R600 Localized edema: Secondary | ICD-10-CM | POA: Insufficient documentation

## 2020-03-12 DIAGNOSIS — Z992 Dependence on renal dialysis: Secondary | ICD-10-CM | POA: Insufficient documentation

## 2020-03-12 DIAGNOSIS — N186 End stage renal disease: Secondary | ICD-10-CM | POA: Insufficient documentation

## 2020-03-12 DIAGNOSIS — Z8616 Personal history of COVID-19: Secondary | ICD-10-CM | POA: Insufficient documentation

## 2020-03-12 DIAGNOSIS — I12 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 disease: Secondary | ICD-10-CM | POA: Insufficient documentation

## 2020-03-12 DIAGNOSIS — Z79899 Other long term (current) drug therapy: Secondary | ICD-10-CM | POA: Insufficient documentation

## 2020-03-12 DIAGNOSIS — T7840XA Allergy, unspecified, initial encounter: Secondary | ICD-10-CM | POA: Insufficient documentation

## 2020-03-12 LAB — BASIC METABOLIC PANEL
Anion gap: 13 (ref 5–15)
BUN: 26 mg/dL — ABNORMAL HIGH (ref 6–20)
CO2: 29 mmol/L (ref 22–32)
Calcium: 9.2 mg/dL (ref 8.9–10.3)
Chloride: 92 mmol/L — ABNORMAL LOW (ref 98–111)
Creatinine, Ser: 4.83 mg/dL — ABNORMAL HIGH (ref 0.44–1.00)
GFR calc Af Amer: 11 mL/min — ABNORMAL LOW (ref >60)
GFR calc non Af Amer: 9 mL/min — ABNORMAL LOW (ref >60)
Glucose, Bld: 88 mg/dL (ref 70–99)
Potassium: 3.5 mmol/L (ref 3.5–5.1)
Sodium: 134 mmol/L — ABNORMAL LOW (ref 135–145)

## 2020-03-12 LAB — CBC
HCT: 35.6 % — ABNORMAL LOW (ref 36.0–46.0)
Hemoglobin: 12 g/dL (ref 12.0–15.0)
MCH: 31.3 pg (ref 26.0–34.0)
MCHC: 33.7 g/dL (ref 30.0–36.0)
MCV: 92.7 fL (ref 80.0–100.0)
Platelets: 197 10*3/uL (ref 150–400)
RBC: 3.84 MIL/uL — ABNORMAL LOW (ref 3.87–5.11)
RDW: 15.4 % (ref 11.5–15.5)
WBC: 10.2 10*3/uL (ref 4.0–10.5)
nRBC: 0 % (ref 0.0–0.2)

## 2020-03-12 MED ORDER — SODIUM CHLORIDE 0.9% FLUSH: 3.0000 mL | Freq: Once | INTRAVENOUS | Status: DC

## 2020-03-12 NOTE — ED Triage Notes (Signed)
Pt arrives to ED with [facility] with complaints of a reaction to the COVID 19 shot after her dialysis treatment. Per EMS patient finished her dialysis, received her shot and her tongue and lips began to swell. Patient received 50mg  benadryl and 125mg  soul medrol from facility. Patient has no stridor or wheezing in triage, swelling seems to have decreased.

## 2020-03-13 ENCOUNTER — Encounter: Payer: Self-pay | Admitting: Internal Medicine

## 2020-03-13 MED ORDER — DIPHENHYDRAMINE HCL 25 MG PO TABS: 25.0000 mg | ORAL_TABLET | Freq: Four times a day (QID) | ORAL | 0 refills | Status: DC

## 2020-03-13 MED ORDER — FAMOTIDINE 20 MG PO TABS: 20.0000 mg | ORAL_TABLET | Freq: Every day | ORAL | 0 refills | Status: DC

## 2020-03-13 MED ORDER — EPINEPHRINE 0.3 MG/0.3ML IJ SOAJ: 0.3000 mg | INTRAMUSCULAR | 0 refills | Status: DC | PRN

## 2020-03-13 MED ORDER — FAMOTIDINE 20 MG PO TABS
20.0000 mg | ORAL_TABLET | Freq: Once | ORAL | Status: AC
  Administered 2020-03-13: 20 mg via ORAL
  Filled 2020-03-13: qty 1

## 2020-03-13 MED ORDER — PREDNISONE 10 MG PO TABS: 40.0000 mg | ORAL_TABLET | Freq: Every day | ORAL | 0 refills | Status: AC

## 2020-03-13 MED FILL — ?PREDINSONE 10MG TABLETS: 10 | 4 days supply | Qty: 16 | Fill #0

## 2020-03-13 MED FILL — EPINEPHRINE 0.3 MG AUTO-INJ: 0.3 | 2 days supply | Qty: 2 | Fill #0

## 2020-03-13 MED FILL — ETHYL CHLORIDE AERO: 30 days supply | Qty: 116 | Fill #3

## 2020-03-13 MED FILL — FAMOTIDINE 20 MG TABS: 20 | 5 days supply | Qty: 5 | Fill #0

## 2020-03-13 NOTE — ED Notes (Signed)
Discharge instructions reviewed with pt and pt daughter. Pt and daughter verbalized understanding.

## 2020-03-13 NOTE — ED Provider Notes (Signed)
[facility] EMERGENCY DEPARTMENT Provider Note  CSN: [id number] Arrival date & time: 03/12/20 1629  Chief Complaint(s) Allergic Reaction  HPI Sabrina Mitchell is a 59 y.o. female with a history of ESRD on dialysis who presents to the emergency department with allergic reaction.  Patient reports that during dialysis she received the Covid vaccine shot.  1 hour after, she began to have swelling of her tongue.  EMS was called who gave the patient IV Benadryl and Solu-Medrol.  Episode occurred around 3 PM this afternoon.  Since being medicated, her symptoms have resolved.  She denied any associated urticaria, chest pain, shortness of breath, nausea, vomiting, abdominal pain.  Patient denies any other physical complaints.  Patient reports being diagnosed with COVID-19 back in October.   Patient has been waiting in the emergency department for almost 9 hours.   HPI  Past Medical History Past Medical History:  Diagnosis Date  . Anemia   . Anxiety   . Complication of anesthesia   . Depression    denies  . End stage renal disease ([place])    hemodialysis 3x/wk @ [address]  . Family history of adverse reaction to anesthesia    sister vomiting after uterine cyst removal  . Gastritis   . GERD (gastroesophageal reflux disease)   . Hemorrhoids   . Hypertension   . Pneumonia    years ago  . PONV (postoperative nausea and vomiting)   . UTI (lower urinary tract infection) March 2014   Patient Active Problem List   Diagnosis Date Noted  . Acute respiratory failure with hypoxia ([place]) 09/27/2019  . Pulmonary edema 09/26/2019  . Chest pain 09/26/2019  . Elevated troponin 09/26/2019  . Calcaneal spur of right foot 10/10/2018  . 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 GERD (gastroesophageal reflux disease) 01/17/2016  . 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 Post-menopausal bleeding 04/06/2012  . End stage renal  disease on dialysis ([place]) 04/06/2012  . Hypertension 04/06/2012   Home Medication(s) Prior to Admission medications   Medication Sig Start Date End Date Taking? Authorizing Provider  acetaminophen (TYLENOL) 325 MG tablet Take 650 mg by mouth every 6 (six) hours as needed for mild pain.     [provider]  amLODipine (NORVASC) 10 MG tablet Take 1 tablet (10 mg total) by mouth at bedtime. Patient taking differently: Take 5 mg by mouth 2 (two) times daily.  10/10/18   Ladell Pier, MD  B Complex-C-Folic Acid (DIALYVITE 440) 0.8 MG TABS Take 1 tablet by mouth daily. 09/06/19   [provider]  carvedilol (COREG) 6.25 MG tablet Take 1 tablet (6.25 mg total) by mouth at bedtime. 10/10/18   Ladell Pier, MD  diphenhydrAMINE (BENADRYL) 25 MG tablet Take 1 tablet (25 mg total) by mouth every 6 (six) hours for 5 days. 03/13/20 03/18/20  Fatima Blank, MD  EPINEPHrine 0.3 mg/0.3 mL IJ SOAJ injection Inject 0.3 mLs (0.3 mg total) into the muscle as needed for anaphylaxis. 03/13/20   , Grayce Sessions, MD  ethyl chloride spray APPLY 1 APPLICATION TOPICALLY AS NEEDED (FOR DIALYSIS). 02/17/19   Ladell Pier, MD  famotidine (PEPCID) 20 MG tablet Take 1 tablet (20 mg total) by mouth daily for 5 days. 03/13/20 03/18/20  Fatima Blank, MD  Lanthanum Carbonate (FOSRENOL) 750 MG PACK Take 750 mg by mouth 3 (three) times daily with meals. 05/06/19   [provider]  omeprazole (PRILOSEC) 20  MG capsule Take 1 capsule (20 mg total) by mouth daily. 1 PO 30 mins prior to breakfast and supper Patient taking differently: Take 20 mg by mouth daily as needed (acid reflux).  08/06/17   Ladell Pier, MD  predniSONE (DELTASONE) 10 MG tablet Take 4 tablets (40 mg total) by mouth daily for 4 days. 03/13/20 03/17/20  Fatima Blank, MD                                                                                                                                     Past Surgical History Past Surgical History:  Procedure Laterality Date  . AV FISTULA PLACEMENT Left 07/05/09   Dr. Kellie Simmering  . COLONOSCOPY N/A 02/10/2016   SLF: 1. HEME postive stool due to colon polyps intrernal hemorrhoids 2. small internal hemrrohoids 3. moderate external hemorrhoids.   . ESOPHAGEAL DILATION N/A 02/10/2016   Procedure: ESOPHAGEAL DILATION;  Surgeon: Danie Binder, MD;  [facility];  Service: Endoscopy;  Laterality: N/A;  . ESOPHAGOGASTRODUODENOSCOPY N/A 02/10/2016   SLF: patent Schatzki's ring , mild gastritis/ duodentitis.   Marland Kitchen FISTULA SUPERFICIALIZATION Left 78/04/8849   Procedure: PLICATION BRACHIOCEPHALIC FISTULA;  Surgeon: Angelia Mould, MD;  Location: [facility];  Service: Vascular;  Laterality: Left;  . FISTULOGRAM N/A 10/22/2014   Procedure: FISTULOGRAM;  Surgeon: Angelia Mould, MD;  Location: [facility] CATH LAB;  Service: Cardiovascular;  Laterality: N/A;  . IR DIALY SHUNT INTRO NEEDLE/INTRACATH INITIAL W/IMG LEFT Left 02/01/2019  . PERIPHERAL VASCULAR CATHETERIZATION Left 12/09/2016   Procedure: A/V Fistulagram;  Surgeon: Waynetta Sandy, MD;  Location: [facility] CV LAB;  Service: Cardiovascular;  Laterality: Left;  . REVISON OF ARTERIOVENOUS FISTULA Left 27/74/1287   Procedure: PLICATION OF BRACHIOCEPHALIC FISTULA;  Surgeon: Conrad Reserve, MD;  Location: [facility];  Service: Vascular;  Laterality: Left;  . REVISON OF ARTERIOVENOUS FISTULA Left 08/09/2017   Procedure: REVISION OF ARTERIOVENOUS FISTULA LIGATION OF SIDE BRANCH;  Surgeon: Waynetta Sandy, MD;  Location: [facility];  Service: Vascular;  Laterality: Left;  . TUBAL LIGATION  2004   Family History Family History  Problem Relation Age of Onset  . Hypertension Mother   . Hypertension Father   . Hypertension Sister   . Breast cancer Sister 72  . Diabetes Brother   . Hypertension Brother     Social History Social History   Tobacco Use  . Smoking status: Never Smoker  .  Smokeless tobacco: Never Used  Substance Use Topics  . Alcohol use: No  . Drug use: No   Allergies Amitriptyline and Trazodone and nefazodone  Review of Systems Review of Systems All other systems are reviewed and are negative for acute change except as noted in the HPI  Physical Exam Vital Signs  I have reviewed the triage vital signs BP 113/68 (BP Location: Right Arm)   Pulse 72   Temp 98.5 F (36.9  C) (Oral)   Resp 15   LMP 02/11/2012 Comment: tubal ligation  SpO2 98%   Physical Exam Vitals reviewed.  Constitutional:      General: She is not in acute distress.    Appearance: She is well-developed. She is not diaphoretic.  HENT:     Head: Normocephalic and atraumatic.     Nose: Nose normal.  Eyes:     General: No scleral icterus.       Right eye: No discharge.        Left eye: No discharge.     Conjunctiva/sclera: Conjunctivae normal.     Pupils: Pupils are equal, round, and reactive to light.  Cardiovascular:     Rate and Rhythm: Normal rate and regular rhythm.     Heart sounds: No murmur. No friction rub. No gallop.   Pulmonary:     Effort: Pulmonary effort is normal. No respiratory distress.     Breath sounds: Normal breath sounds. No stridor. No rales.  Abdominal:     General: There is no distension.     Palpations: Abdomen is soft.     Tenderness: There is no abdominal tenderness.  Musculoskeletal:        General: No tenderness.     Cervical back: Normal range of motion and neck supple.  Skin:    General: Skin is warm and dry.     Findings: No erythema or rash.       Neurological:     Mental Status: She is alert and oriented to person, place, and time.     ED Results and Treatments Labs (all labs ordered are listed, but only abnormal results are displayed) Labs Reviewed  BASIC METABOLIC PANEL - Abnormal; Notable for the following components:      Result Value   Sodium 134 (*)    Chloride 92 (*)    BUN 26 (*)    Creatinine, Ser 4.83 (*)     GFR calc non Af Amer 9 (*)    GFR calc Af Amer 11 (*)    All other components within normal limits  CBC - Abnormal; Notable for the following components:   RBC 3.84 (*)    HCT 35.6 (*)    All other components within normal limits  CBG MONITORING, ED                                                                                                                         EKG  EKG Interpretation  Date/Time:  Tuesday March 12 2020 16:42:17 EDT Ventricular Rate:  78 PR Interval:  118 QRS Duration: 100 QT Interval:  452 QTC Calculation: 515 R Axis:   61 Text Interpretation: Normal sinus rhythm Left ventricular hypertrophy with repolarization abnormality ( Sokolow-Lyon ) Prolonged QT Abnormal ECG No acute changes Confirmed by [place] Lank [phone]) on 03/13/2020 1:16:20 AM      Radiology No results found.  Pertinent labs & imaging results that were available during my care  of the patient were reviewed by me and considered in my medical decision making (see chart for details).  Medications Ordered in ED Medications  sodium chloride flush (NS) 0.9 % injection 3 mL (has no administration in time range)  famotidine (PEPCID) tablet 20 mg (has no administration in time range)                                                                                                                                    Procedures Procedures  (including critical care time)  Medical Decision Making / ED Course I have reviewed the nursing notes for this encounter and the patient's prior records (if available in EHR or on provided paperwork).   NECOLE MINASSIAN was evaluated in Emergency Department on 03/13/2020 for the symptoms described in the history of present illness. She was evaluated in the context of the global COVID-19 pandemic, which necessitated consideration that the patient might be at risk for infection with the SARS-CoV-2 virus that causes COVID-19. Institutional protocols and algorithms  that pertain to the evaluation of patients at risk for COVID-19 are in a state of rapid change based on information released by regulatory bodies including the CDC and federal and state organizations. These policies and algorithms were followed during the patient's care in the ED.  Allergic reaction likely to Covid vaccine. Improved with IV Benadryl and Solu-Medrol. Does not appear to be anaphylactic reaction requiring epinephrine. Patient has been stable without recurrence of symptoms while waiting in the emergency department waiting room. On my evaluation the patient is well-appearing, well-hydrated, nontoxic without any respiratory distress.  No evidence of angioedema.  Provided with oral Pepcid.  Feel she is stable for outpatient management.  Steroids, Benadryl, Pepcid and EpiPen prescribed.      Final Clinical Impression(s) / ED Diagnoses Final diagnoses:  Allergic reaction, initial encounter   The patient appears reasonably screened and/or stabilized for discharge and I doubt any other medical condition or other [facility] requiring further screening, evaluation, or treatment in the ED at this time prior to discharge. Safe for discharge with strict return precautions.  Disposition: Discharge  Condition: Good  I have discussed the results, Dx and Tx plan with the patient/family who expressed understanding and agree(s) with the plan. Discharge instructions discussed at length. The patient/family was given strict return precautions who verbalized understanding of the instructions. No further questions at time of discharge.    ED Discharge Orders         Ordered    famotidine (PEPCID) 20 MG tablet  Daily     03/13/20 0144    diphenhydrAMINE (BENADRYL) 25 MG tablet  Every 6 hours     03/13/20 0144    predniSONE (DELTASONE) 10 MG tablet  Daily     03/13/20 0144    EPINEPHrine 0.3 mg/0.3 mL IJ SOAJ injection  As needed     03/13/20 0144  Follow Up: Ladell Pier,  MD [place] [address] [phone]  Schedule an appointment as soon as possible for a visit        This chart was dictated using voice recognition software.  Despite best efforts to proofread,  errors can occur which can change the documentation meaning.   Fatima Blank, MD 03/13/20 [phone]

## 2020-03-22 ENCOUNTER — Telehealth: Payer: Self-pay | Admitting: Internal Medicine

## 2020-03-22 NOTE — Telephone Encounter (Signed)
Patient went to emergency room on 3/16 due to reaction to the covid vaccine and they give her famotidine 20 mg only 5 pills  and she still filling tired and don't want to do nothing   and  The medicine was helping her she use Proofreader . Please, call her  Thank you.

## 2020-03-22 NOTE — Telephone Encounter (Signed)
Will forward to pcp

## 2020-03-24 MED ORDER — FAMOTIDINE 10 MG PO TABS: 10.0000 mg | ORAL_TABLET | ORAL | 0 refills | Status: DC

## 2020-03-25 MED FILL — AMLODIPINE BESYLATE 5 MG TA: 5 | 30 days supply | Qty: 60 | Fill #2

## 2020-03-25 NOTE — Progress Notes (Signed)
Patient ID: Sabrina Mitchell, female   DOB: 12/27/1961, 59 y.o.   MRN: [id number]  Virtual Visit via Telephone Note  I connected with Sabrina Mitchell on 03/27/20 at  1:30 PM EDT by telephone and verified that I am speaking with the correct person using two identifiers.   I discussed the limitations, risks, security and privacy concerns of performing an evaluation and management service by telephone and the availability of in person appointments. I also discussed with the patient that there may be a patient responsible charge related to this service. The patient expressed understanding and agreed to proceed.   PATIENT visit by telephone virtually in the context of Covid-19 pandemic. Patient location:  home My Location:  [facility] office Persons on the call: me, interpreter, and the patient    History of Present Illness: After ED visit 03/12/2020 for allergic reaction to the Covid vaccine.  She has been feeling much better each day.  Overall, she feels like she has been more fatigued than usual.  She is asking about vitamins that would be ok to take while also on dialysis.  Fatigue has been going on for months.    Undergoing dialysis on Tuesday, Thursday, and Saturday.  From ED A/P: Sabrina Mitchell was evaluated in Emergency Department on 03/13/2020 for the symptoms described in the history of present illness. She was evaluated in the context of the global COVID-19 pandemic, which necessitated consideration that the patient might be at risk for infection with the SARS-CoV-2 virus that causes COVID-19. Institutional protocols and algorithms that pertain to the evaluation of patients at risk for COVID-19 are in a state of rapid change based on information released by regulatory bodies including the CDC and federal and state organizations. These policies and algorithms were followed during the patient's care in the ED.  Allergic reaction likely to Covid vaccine. Improved with IV  Benadryl and Solu-Medrol. Does not appear to be anaphylactic reaction requiring epinephrine. Patient has been stable without recurrence of symptoms while waiting in the emergency department waiting room. On my evaluation the patient is well-appearing, well-hydrated, nontoxic without any respiratory distress.  No evidence of angioedema.  Provided with oral Pepcid.   Observations/Objective:  NAD.  A&Ox3   Assessment and Plan: 1. Allergic reaction, subsequent encounter Resolved; has been added to allergies  2. Encounter for examination following treatment at hospital   3. Fatigue, unspecified type -can ask nephrologist about a multivitamin-probably will be fine - TSH - Vitamin D, 25-hydroxy - CBC with Differential/Platelet  4. Hyponatremia In ED - Basic metabolic panel  Follow Up Instructions: See PCP in 2-3 months   I discussed the assessment and treatment plan with the patient. The patient was provided an opportunity to ask questions and all were answered. The patient agreed with the plan and demonstrated an understanding of the instructions.   The patient was advised to call back or seek an in-person evaluation if the symptoms worsen or if the condition fails to improve as anticipated.  I provided 14 minutes of non-face-to-face time during this encounter.   Freeman Caldron, PA-C

## 2020-03-25 NOTE — Telephone Encounter (Signed)
[facility] [employer] [place]  Id# [id number]  contacted pt to go over Dr. Wynetta Emery response pt is aware. Pt wants to schedule a tele for fatigue. Pt is schedule for Wednesday March 31 for a tele

## 2020-03-27 ENCOUNTER — Ambulatory Visit: Payer: Self-pay | Attending: Internal Medicine | Admitting: Physician Assistant

## 2020-03-27 ENCOUNTER — Ambulatory Visit: Payer: Self-pay

## 2020-03-27 ENCOUNTER — Other Ambulatory Visit: Payer: Self-pay

## 2020-03-27 DIAGNOSIS — R5383 Other fatigue: Secondary | ICD-10-CM

## 2020-03-27 DIAGNOSIS — E871 Hypo-osmolality and hyponatremia: Secondary | ICD-10-CM

## 2020-03-27 DIAGNOSIS — T7840XD Allergy, unspecified, subsequent encounter: Secondary | ICD-10-CM

## 2020-03-27 DIAGNOSIS — Z09 Encounter for follow-up examination after completed treatment for conditions other than malignant neoplasm: Secondary | ICD-10-CM

## 2020-03-27 NOTE — Progress Notes (Signed)
She got the Covid vaccine on Tuesday and she began to feel bad.

## 2020-03-28 LAB — CBC WITH DIFFERENTIAL/PLATELET
Basophils Absolute: 0 10*3/uL (ref 0.0–0.2)
Basos: 1 %
EOS (ABSOLUTE): 0.1 10*3/uL (ref 0.0–0.4)
Eos: 1 %
Hematocrit: 34.5 % (ref 34.0–46.6)
Hemoglobin: 11.6 g/dL (ref 11.1–15.9)
Immature Grans (Abs): 0 10*3/uL (ref 0.0–0.1)
Immature Granulocytes: 0 %
Lymphocytes Absolute: 1.1 10*3/uL (ref 0.7–3.1)
Lymphs: 16 %
MCH: 32 pg (ref 26.6–33.0)
MCHC: 33.6 g/dL (ref 31.5–35.7)
MCV: 95 fL (ref 79–97)
Monocytes Absolute: 0.5 10*3/uL (ref 0.1–0.9)
Monocytes: 8 %
Neutrophils Absolute: 5 10*3/uL (ref 1.4–7.0)
Neutrophils: 74 %
Platelets: 160 10*3/uL (ref 150–450)
RBC: 3.62 x10E6/uL — ABNORMAL LOW (ref 3.77–5.28)
RDW: 15.2 % (ref 11.7–15.4)
WBC: 6.7 10*3/uL (ref 3.4–10.8)

## 2020-03-28 LAB — BASIC METABOLIC PANEL
BUN/Creatinine Ratio: 6 — ABNORMAL LOW (ref 9–23)
BUN: 46 mg/dL — ABNORMAL HIGH (ref 6–24)
CO2: 26 mmol/L (ref 20–29)
Calcium: 9.8 mg/dL (ref 8.7–10.2)
Chloride: 94 mmol/L — ABNORMAL LOW (ref 96–106)
Creatinine, Ser: 7.2 mg/dL — ABNORMAL HIGH (ref 0.57–1.00)
GFR calc Af Amer: 7 mL/min/{1.73_m2} — ABNORMAL LOW (ref >59)
GFR calc non Af Amer: 6 mL/min/{1.73_m2} — ABNORMAL LOW (ref >59)
Glucose: 77 mg/dL (ref 65–99)
Potassium: 4 mmol/L (ref 3.5–5.2)
Sodium: 140 mmol/L (ref 134–144)

## 2020-03-28 LAB — TSH: TSH: 0.764 u[IU]/mL (ref 0.450–4.500)

## 2020-03-28 LAB — VITAMIN D 25 HYDROXY (VIT D DEFICIENCY, FRACTURES): Vit D, 25-Hydroxy: 12.7 ng/mL — ABNORMAL LOW (ref 30.0–100.0)

## 2020-05-03 MED FILL — ETHYL CHLORIDE AERO: 30 days supply | Qty: 116 | Fill #4

## 2020-05-03 MED FILL — AMLODIPINE BESYLATE 5 MG TA: 5 | 30 days supply | Qty: 60 | Fill #3

## 2020-05-03 MED FILL — ?CARVEDILOL 6.25 MG TABLET: 6.25 | 30 days supply | Qty: 60 | Fill #6

## 2020-05-31 ENCOUNTER — Other Ambulatory Visit (HOSPITAL_COMMUNITY): Payer: Self-pay | Admitting: Nephrology

## 2020-06-03 ENCOUNTER — Other Ambulatory Visit (HOSPITAL_COMMUNITY): Payer: Self-pay | Admitting: Nephrology

## 2020-06-03 ENCOUNTER — Other Ambulatory Visit: Payer: Self-pay | Admitting: Radiology

## 2020-06-03 DIAGNOSIS — N186 End stage renal disease: Secondary | ICD-10-CM

## 2020-06-04 NOTE — Consult Note (Signed)
Referring Physician(s): Dr. Willy Eddy  Supervising Physician: Arne Cleveland  Patient Status: [facility] - Outpatient  Chief Complaint:  Decreased arterial flow and pain in left upper arm brachiocephalic AVF  Subjective:  59 y.o. Spanish speaking female outpatient. History of ESRD with  a  left upper arm brachiocephalic AVF created on 4.5.11. Patient had a plication  on 14.4.81 and 10.31.17. Revision with ligation of left side branch on 8.13.18. Fistulogram performed by surgery on 12.13.17 and 10.26.21.  Dialysis Team is requesting decreased arterial flow and pain. Patient alert and laying in bed, tearful. Patient states that she is afraid of pain and states that fistula is painful to access. Denies any fevers, headache, chest pain, SOB, cough, abdominal pain, nausea, vomiting. Patient report minimal bleeding occasionally when being accessed.    Allergies: Covid-19 (mrna) vaccine, Amitriptyline, and Trazodone and nefazodone  Medications: Prior to Admission medications   Medication Sig Start Date End Date Taking? Authorizing Provider  acetaminophen (TYLENOL) 325 MG tablet Take 650 mg by mouth every 6 (six) hours as needed for mild pain.    Yes [provider]  amLODipine (NORVASC) 5 MG tablet Take 5 mg by mouth in the morning and at bedtime.    Yes [provider]  B Complex-C-Folic Acid (DIALYVITE 856) 0.8 MG TABS Take 1 tablet by mouth daily. 09/06/19  Yes [provider]  calcium carbonate (TUMS - DOSED IN MG ELEMENTAL CALCIUM) 500 MG chewable tablet Chew 2 tablets by mouth 3 (three) times daily with meals.   Yes [provider]  carvedilol (COREG) 6.25 MG tablet Take 1 tablet (6.25 mg total) by mouth at bedtime. Patient taking differently: Take 6.25 mg by mouth 2 (two) times daily with a meal.  10/10/18  Yes Ladell Pier, MD  ethyl chloride spray APPLY 1 APPLICATION TOPICALLY AS NEEDED (FOR DIALYSIS). 02/17/19  Yes Ladell Pier, MD  omeprazole  (PRILOSEC) 20 MG capsule Take 1 capsule (20 mg total) by mouth daily. 1 PO 30 mins prior to breakfast and supper Patient taking differently: Take 20 mg by mouth daily as needed (acid reflux).  08/06/17  Yes Ladell Pier, MD  EPINEPHrine 0.3 mg/0.3 mL IJ SOAJ injection Inject 0.3 mLs (0.3 mg total) into the muscle as needed for anaphylaxis. Patient not taking: Reported on 06/04/2020 03/13/20   Fatima Blank, MD  famotidine (PEPCID) 10 MG tablet Take 1 tablet (10 mg total) by mouth every other day. Patient not taking: Reported on 06/04/2020 03/24/20   Ladell Pier, MD  Lanthanum Carbonate (FOSRENOL) 750 MG PACK Take 750 mg by mouth 3 (three) times daily with meals. 05/06/19   [provider]     Vital Signs: LMP 02/11/2012 Comment: tubal ligation  Physical Exam Cardiovascular:     Rate and Rhythm: Normal rate and regular rhythm.     Heart sounds: Normal heart sounds.     Comments: LUE brachiocephalic fistula +/+. aneurismal  Pulmonary:     Effort: Pulmonary effort is normal.     Breath sounds: Normal breath sounds.  Neurological:     Mental Status: She is alert and oriented to person, place, and time.  Psychiatric:        Thought Content: Thought content normal.     Imaging: No results found.  Labs:  CBC: Recent Labs    09/27/19 0537 09/27/19 0537 10/11/19 1515 10/11/19 1742 03/12/20 1719 03/27/20 1347  WBC 8.2  --  3.9*  --  10.2 6.7  HGB 11.1*   < > 11.7* 11.6* 12.0 11.6  HCT 34.7*   < > 36.6 34.0* 35.6* 34.5  PLT 179  --  113*  --  197 160   < > = values in this interval not displayed.    COAGS: No results for input(s): INR, APTT in the last 8760 hours.  BMP: Recent Labs    09/27/19 0537 09/27/19 0537 10/11/19 1515 10/11/19 1742 03/12/20 1719 03/27/20 1347  NA 138   < > 139 137 134* 140  K 4.4   < > 3.8 3.5 3.5 4.0  CL 98   < > 92* 93* 92* 94*  CO2 25  --  30  --  29 26  GLUCOSE 83   < > 111* 87 88 77  BUN 19   < > 27* 32* 26*  46*  CALCIUM 10.2  --  9.3  --  9.2 9.8  CREATININE 6.59*   < > 8.41* 9.30* 4.83* 7.20*  GFRNONAA 6*  --  5*  --  9* 6*  GFRAA 7*  --  6*  --  11* 7*   < > = values in this interval not displayed.    LIVER FUNCTION TESTS: Recent Labs    09/26/19 0702 09/27/19 0537  BILITOT 0.6  --   AST 39  --   ALT 33  --   ALKPHOS 88  --   PROT 6.9  --   ALBUMIN 3.7 3.4*    Assessment and Plan:  59 y.o. Spanish speaking female outpatient. History of ESRD with  a  left upper arm brachiocephalic AVF created on 4.5.11. Patient had a plication  on 86.3.81 and 10.31.17. Revision with ligation of left side branch on 8.13.18. Fistulogram performed by surgery on 12.13.17 and 10.26.21.  Dialysis Team is requesting decreased arterial flow and pain.  Pertinent Imaging none  Pertinent IR History 2.5.20 - Fistulogram impression  Left arm AV circuit is widely patent 11.9.12- Fistulogram reads Left upper arm cephalic vein is patent without a focal or critical stenosis  Pertinent Allergies none  No recent labs. Medications are within acceptable parameters. Patient is afebrile.  With the use of an interpreter the risks and benefits discussed with the patient including, but not limited to bleeding, infection, vascular injury, pulmonary embolism, need for tunneled HD catheter placement or even death.  All of the patient's questions were answered, patient is agreeable to proceed. Consent signed and in chart.    Electronically Signed: Avel Peace, NP 06/04/2020, 8:15 PM   I spent a total of 25 Minutes at the patient's bedside AND on the patient's hospital floor or unit, greater than 50% of which was counseling/coordinating care for fistulogram possible angioplasty possible stent placement possible perm cath placement.

## 2020-06-05 ENCOUNTER — Ambulatory Visit (HOSPITAL_COMMUNITY)
Admission: RE | Admit: 2020-06-05 | Discharge: 2020-06-05 | Disposition: A | Discharge status: Home / Self Care | Payer: Self-pay | Source: Ambulatory Visit | Attending: Nephrology | Admitting: Nephrology

## 2020-06-05 ENCOUNTER — Other Ambulatory Visit: Payer: Self-pay

## 2020-06-05 DIAGNOSIS — Y841 Kidney dialysis as the cause of abnormal reaction of the patient, or of later complication, without mention of misadventure at the time of the procedure: Secondary | ICD-10-CM | POA: Insufficient documentation

## 2020-06-05 DIAGNOSIS — T82510A Breakdown (mechanical) of surgically created arteriovenous fistula, initial encounter: Secondary | ICD-10-CM | POA: Insufficient documentation

## 2020-06-05 DIAGNOSIS — Z992 Dependence on renal dialysis: Secondary | ICD-10-CM | POA: Insufficient documentation

## 2020-06-05 DIAGNOSIS — N186 End stage renal disease: Secondary | ICD-10-CM | POA: Insufficient documentation

## 2020-06-05 DIAGNOSIS — Z79899 Other long term (current) drug therapy: Secondary | ICD-10-CM | POA: Insufficient documentation

## 2020-06-05 HISTORY — PX: IR DIALY SHUNT INTRO NEEDLE/INTRACATH INITIAL W/IMG LEFT: IMG6102

## 2020-06-05 MED ORDER — HYDROCODONE-ACETAMINOPHEN 5-325 MG PO TABS: 1.0000 | ORAL_TABLET | ORAL | Status: DC | PRN

## 2020-06-05 MED ORDER — IOHEXOL 300 MG/ML  SOLN
100.0000 mL | Freq: Once | INTRAMUSCULAR | Status: AC | PRN
  Administered 2020-06-05: 28 mL via INTRAVENOUS

## 2020-06-05 MED ORDER — MIDAZOLAM HCL 2 MG/2ML IJ SOLN: 1.0000 mg | Freq: Once | INTRAMUSCULAR | Status: AC

## 2020-06-05 MED ORDER — SODIUM CHLORIDE 0.9 % IV SOLN: INTRAVENOUS | Status: DC

## 2020-06-05 MED ORDER — MIDAZOLAM HCL 2 MG/2ML IJ SOLN
INTRAMUSCULAR | Status: AC
  Administered 2020-06-05: 1 mg via INTRAVENOUS
  Filled 2020-06-05: qty 2

## 2020-06-05 NOTE — Discharge Instructions (Addendum)
Sedacin consciente moderada en los adultos (Moderate Conscious Sedation, Adult) La sedacin es el uso de un medicamento para favorecer la relajacin y Runner, broadcasting/film/video y la ansiedad. La sedacin consciente moderada es un tipo de sedacin. Cuando se somete a una sedacin consciente moderada, disminuye su nivel de alerta habitual, pero igualmente puede responder a las instrucciones o al tacto, o a ambos. La sedacin consciente moderada se [place] durante procedimientos mdicos y dentales breves. Es ms leve que una sedacin profunda, que es un tipo de sedacin de la que no es fcil despertarse. Tambin es ms leve que la anestesia general, que es el uso de medicamentos para dejarlo inconsciente. La sedacin consciente moderada le permite volver a sus actividades habituales ms pronto. INFORME A SU MDICO:  Cualquier alergia que tenga.  Todos los [employer], incluidos vitaminas, hierbas, gotas oftlmicas, cremas y medicamentos de venta libre.  Uso de corticoides (por va oral o cremas).  Cualquier problema que usted o sus familiares hayan tenido con sedantes y anestsicos.  Enfermedades de la sangre que tenga.  Cirugas previas.  Cualquier afeccin que padezca, como apnea del sueo.  Si est embarazada o podra estarlo.  Consumo de cigarrillos, alcohol, marihuana o drogas. RIESGOS Y COMPLICACIONES En general, se trata de un procedimiento seguro. Sin embargo, pueden presentarse problemas, por ejemplo:  Recibir demasiado medicamento (sedacin excesiva).  Nuseas.  Reaccin alrgica a un medicamento.  Problemas para respirar. Si esto ocurre, es posible que se utilice un tubo respiratorio para ayudarlo a Ambulance person. El tubo se retirar cuando despierte y respire por su cuenta.  Problemas cardacos.  Problemas pulmonares. ANTES DEL PROCEDIMIENTO Mantenerse hidratado Siga las indicaciones del mdico acerca de la hidratacin, las cuales pueden incluir lo siguiente:  Hasta  2horas antes del procedimiento, puede beber lquidos transparentes, como agua, jugos frutales transparentes, caf negro y t solo. Restricciones en las comidas y bebidas [place] comidas y las bebidas, las cuales pueden incluir lo siguiente:  Ocho horas antes del procedimiento, deje de ingerir comidas o alimentos pesados, por ejemplo, carne, alimentos fritos o alimentos grasos.  Seis horas antes del procedimiento, deje de ingerir comidas o alimentos livianos, como tostadas o cereales.  Seis horas antes del procedimiento, deje de beber [place] o bebidas que [employer].  Dos horas antes del procedimiento, deje de beber lquidos transparentes. Medicamentos Consulte al mdico si debe hacer o no lo siguiente:  Quarry manager o suspender los medicamentos que toma habitualmente. Esto es muy importante si toma medicamentos para la diabetes o anticoagulantes.  Tomar medicamentos como aspirina e ibuprofeno. Estos medicamentos pueden tener un efecto anticoagulante en la [place]. No tome estos medicamentos antes del procedimiento si el mdico le indica que no lo haga. Pruebas y exmenes  Le harn un examen fsico.  Posiblemente deba realizarse anlisis de sangre para evaluar lo siguiente: ? Cmo estn funcionando los riones y Engineer, maintenance (IT). ? Con qu eficacia coagula la sangre. Instrucciones generales  Haga planes para que una persona lo lleve a su casa desde el hospital o la clnica.  Si se ir a su casa inmediatamente despus del procedimiento, planifique que alguien se quede con usted durante 24horas. PROCEDIMIENTO  Le colocarn una va intravenosa (IV) en una de las venas.  Se le administrar un medicamento para ayudarlo a que se relaje (sedante) a travs de la va intravenosa.  Se realizar el procedimiento mdico o dental. DESPUS DEL PROCEDIMIENTO  Le controlarn con frecuencia la presin arterial, la  frecuencia cardaca, la frecuencia respiratoria y  el nivel de oxgeno en la sangre hasta que haya desaparecido el efecto de los medicamentos administrados.  No conduzca durante 24horas. Esta informacin no tiene Marine scientist el consejo del mdico. Asegrese de hacerle al mdico cualquier pregunta que tenga. Document Revised: 08/20/2016 Document Reviewed: 04/04/2016 Elsevier Patient Education  2020 [place] dilisis, cuidados posteriores Dialysis Fistulogram, Care After [employer] proporciona informacin sobre cmo cuidarse despus del procedimiento. Su mdico tambin podr darle instrucciones ms especficas. Comunquese con su mdico si tiene problemas o preguntas. Qu puedo esperar despus del procedimiento? Despus del procedimiento, es comn [employer] siguientes sntomas:  Una pequea molestia en la zona en la que se coloc el tubo delgado y pequeo (catter) para el procedimiento.  Un pequeo hematoma alrededor de la fstula.  Somnolencia y cansancio Company secretary). Siga estas indicaciones en su casa: Actividad   Haga reposo en su casa y no levante objetos que pesen ms de 5 lb (2,3 kg) el da despus del procedimiento.  Reanude sus actividades normales segn lo indicado por el mdico. Pregntele al mdico qu actividades son seguras para usted.  No conduzca ni use maquinaria pesada mientras toma analgsicos recetados.  No conduzca durante 24horas si recibi un medicamento para ayudarlo a relajarse (sedante) durante el procedimiento. Medicamentos   [employer] de venta libre y los recetados solamente como se lo haya indicado el mdico. Cuidado del Environmental consultant de la puncin  Siga las indicaciones de su mdico acerca de cmo Government social research officer donde se insertaron los catteres. Asegrese de hacer lo siguiente: ? Lvese las manos con agua y jabn antes de Quarry manager las vendas (vendaje). Use desinfectante para manos si no dispone de [place] y [place]. ? Cambie el vendaje como se lo haya indicado el  mdico. ? No retire los puntos (suturas), la goma para cerrar la piel o las tiras [place]. Es posible que estos cierres cutneos Animal nutritionist en la piel durante 2semanas o ms. Si los bordes de las tiras adhesivas empiezan a despegarse y Therapist, sports, puede recortar los que estn sueltos. No retire las tiras [facility] por completo a menos que el mdico se lo indique.  Controle la zona de la puncin todos los das para detectar signos de infeccin. Est atento a los siguientes signos: ? Dolor, hinchazn o enrojecimiento. ? Lquido o sangre. ? Calor. ? Pus o mal olor. Instrucciones generales  No tome baos de inmersin, no nade ni use el jacuzzi hasta que el mdico lo autorice. Pregntele al mdico si puede ducharse. Thurston Pounds solo le permitan darse baos de [place].  Controle atentamente la fstula de dilisis. Verifique para asegurarse de que puede sentir una vibracin o un zumbido (frmito) cuando Risk manager los dedos sobre la fstula.  Evite daar el injerto o la fstula: ? No use ropa ajustada ni joyas en el brazo o la pierna que tiene el injerto o la fstula. ? Informe a todos sus mdicos que tiene un injerto o una fstula para dilisis. ? No permita extracciones de sangre, terapias intravenosas ni lecturas de la presin arterial en el brazo que tiene la fstula o el injerto. ? No permita que le apliquen vacunas antigripales ni otras vacunas en el brazo con la fstula o el injerto.  Concurra a todas las visitas de seguimiento como se lo haya indicado el mdico. Esto es importante. Comunquese con un mdico si:  Tiene enrojecimiento, hinchazn o dolor en el lugar donde se insert el catter.  Observa lquido o sangre que proviene del lugar de la insercin del catter.  El lugar de la insercin del catter est caliente al tacto.  Tiene pus o percibe mal olor que proviene del lugar de la insercin del catter.  Tiene fiebre o siente escalofros. Solicite ayuda de inmediato si:  Se  siente dbil.  Tiene problemas de equilibrio.  Tiene dificultad para mover los brazos o las piernas.  Tiene problemas visuales o para hablar.  Ya no puede sentir una vibracin o un zumbido cuando [employer] dedos sobre la fstula de dilisis.  La extremidad que se [place] para el procedimiento: ? Se hincha. ? Duele. ? Est fra. ? Cambia de color, por ejemplo, se torna azulada o blanco plido.  Siente falta de aire o Tourist information centre manager. Resumen  Despus de [place] fistulografa de dilisis, es comn tener una pequea molestia o algunos moretones en la zona donde se coloc el tubo delgado y pequeo (catter).  Descanse en su [place] despus del procedimiento. Reanude sus actividades normales segn lo indicado por el mdico.  [employer] de venta libre y los recetados solamente como se lo haya indicado el mdico.  Siga las indicaciones de su mdico acerca de cmo Government social research officer donde se insert el catter.  Concurra a todas las visitas de seguimiento como se lo haya indicado el mdico. Esta informacin no tiene Marine scientist el consejo del mdico. Asegrese de hacerle al mdico cualquier pregunta que tenga. Document Revised: 02/16/2018 Document Reviewed: 02/16/2018 Elsevier Patient Education  2020 [employer].

## 2020-06-05 NOTE — Procedures (Signed)
  Procedure: LUA fistulagram EBL:   minimal Complications:  none immediate  See full dictation in [facility].  Dillard Cannon MD Main # [phone] Pager  [phone]

## 2020-06-14 MED FILL — ETHYL CHLORIDE AERO: 30 days supply | Qty: 116 | Fill #5

## 2020-06-14 MED FILL — AMLODIPINE BESYLATE 5 MG TA: 5 | 30 days supply | Qty: 60 | Fill #4

## 2020-08-06 ENCOUNTER — Other Ambulatory Visit: Payer: Self-pay | Admitting: Nephrology

## 2020-08-06 MED FILL — AMLODIPINE BESYLATE 10 MG T: 10 | 30 days supply | Qty: 30 | Fill #0

## 2020-08-07 MED FILL — CARVEDILOL 6.25 MG TABLET: 6.25 | 30 days supply | Qty: 30 | Fill #0

## 2020-08-07 MED FILL — ETHYL CHLORIDE AERO: 30 days supply | Qty: 116 | Fill #0

## 2020-09-04 MED FILL — ETHYL CHLORIDE AERO: 30 days supply | Qty: 116 | Fill #1

## 2020-09-24 IMAGING — XA IR DAILY SHUNT INTRO NEEDLE/INTRACATH INITIAL W/IMG*L*
4 series · 14 of 24 positions shown · IV contrast (IODINE)
Comparison: 02/01/2019

CLINICAL DATA: End stage renal disease, long-term indwelling left
upper arm hemodialysis fistula. Pain during dialysis x3 months

EXAM:
DIALYSIS SHUNTOGRAM
FLUOROSCOPY TIME:  42 seconds; 7 mGy
TECHNIQUE: An 18-gauge angiocatheter was placed antegrade into the outflow vein
of the patient's left upper armAV hemodialysis fistula for dialysis
fistulography. The angiocatheter was then removed and hemostasis
achieved with manual compression. No immediate complication.

[Series 1: body 4 care · 3 of 22 slices shown (1 of 4)]
[im 1/22]
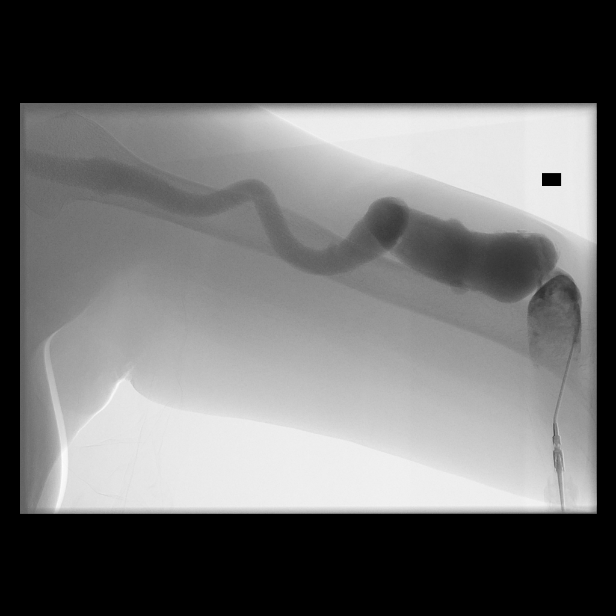
[im 11/22]
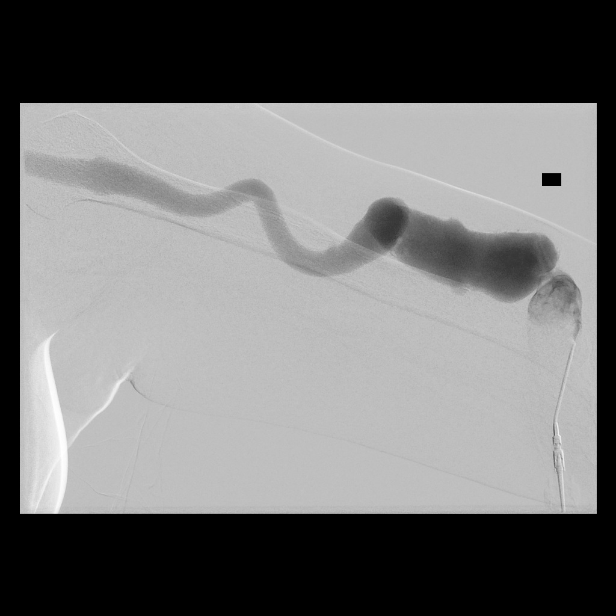
[im 22/22]
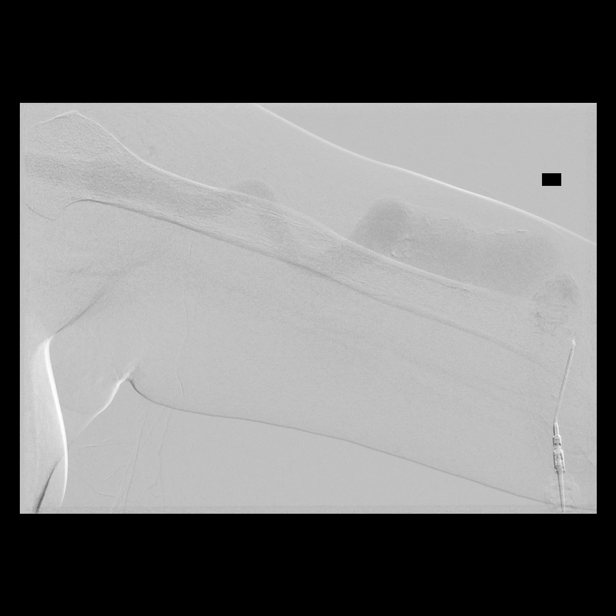

[Series 2: body 4 care · 4 of 26 slices shown (2 of 4)]
[im 5/26]
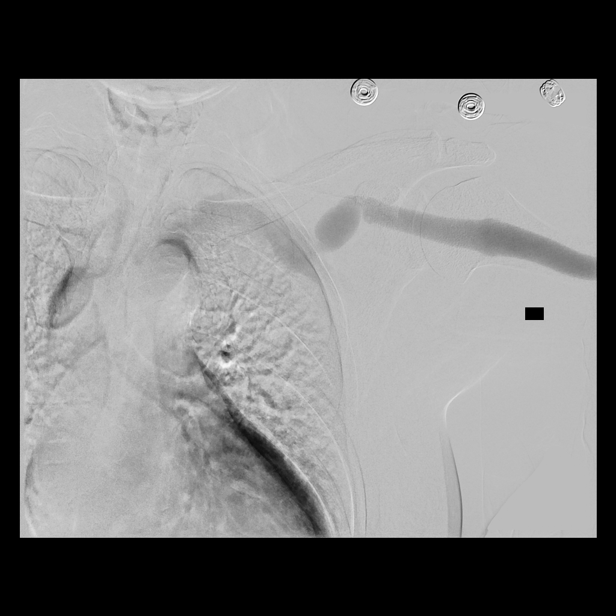
[im 9/26]
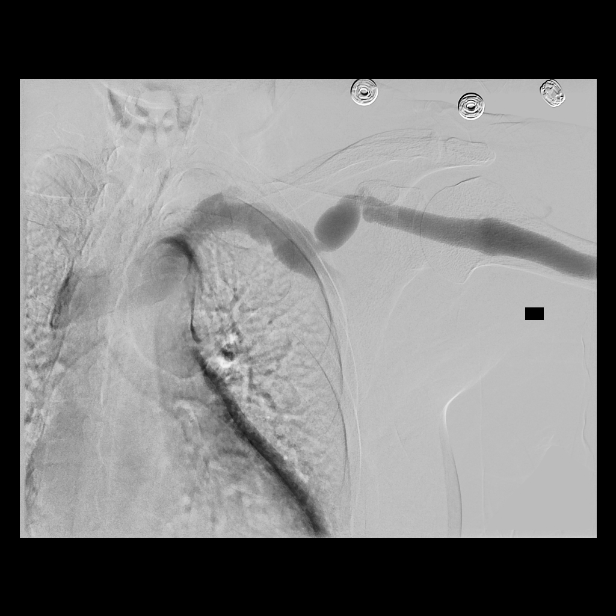
[im 17/26]
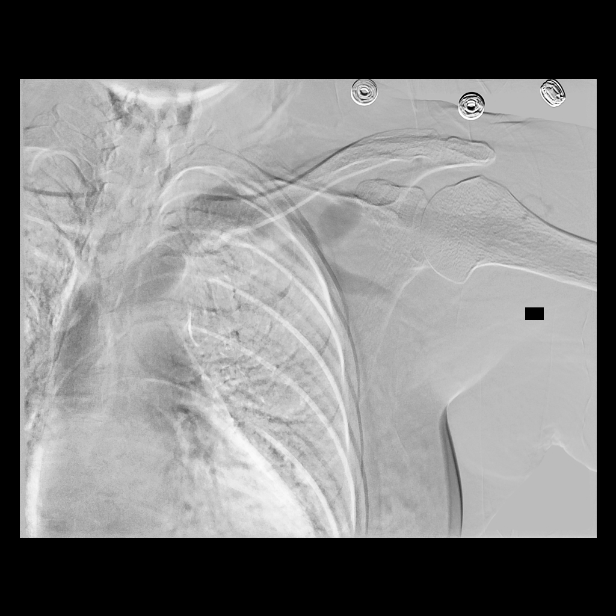
[im 26/26]
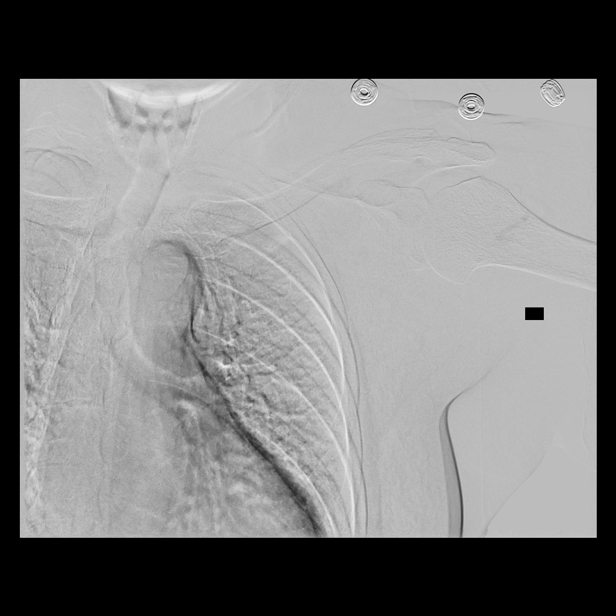

[Series 3: body 4 care · 3 of 20 slices shown (3 of 4)]
[im 1/20]
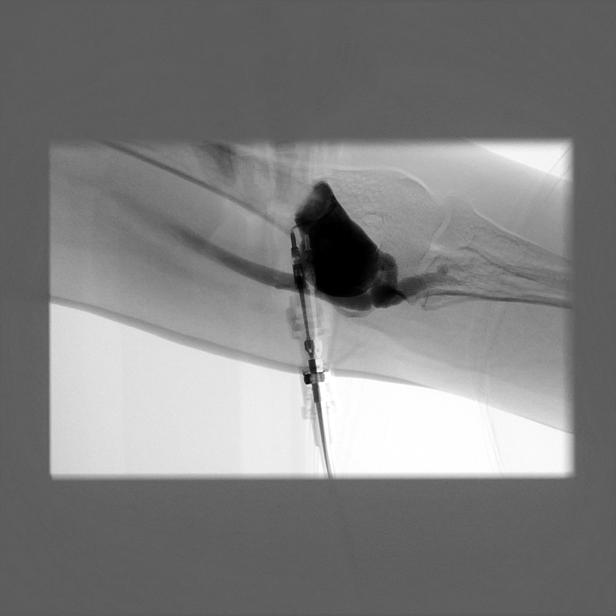
[im 10/20]
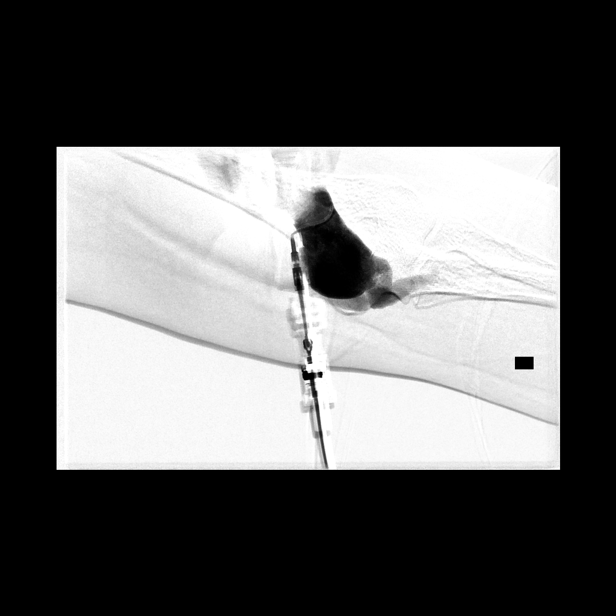
[im 20/20]
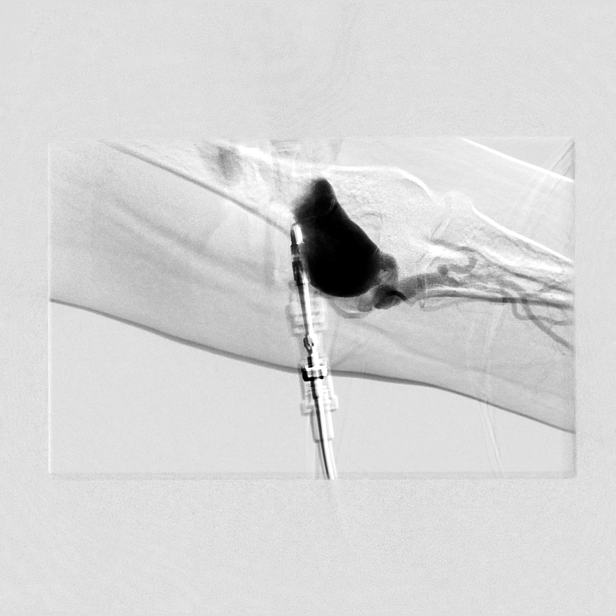

[Series 4: body 4 care · 4 of 26 slices shown (4 of 4)]
[im 5/26]
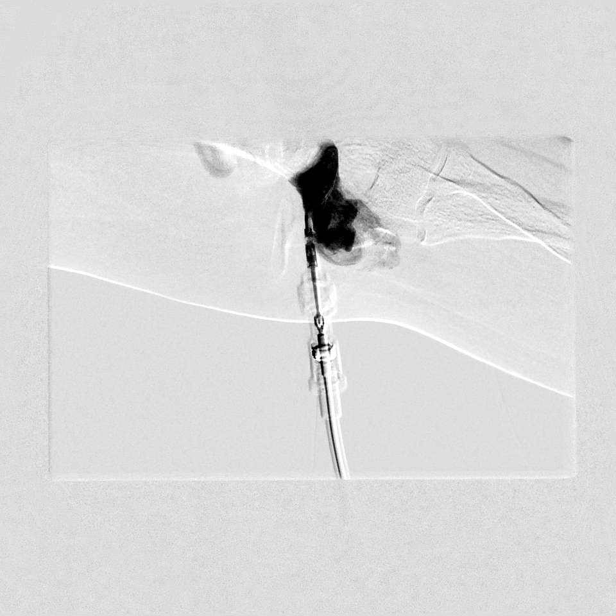
[im 9/26]
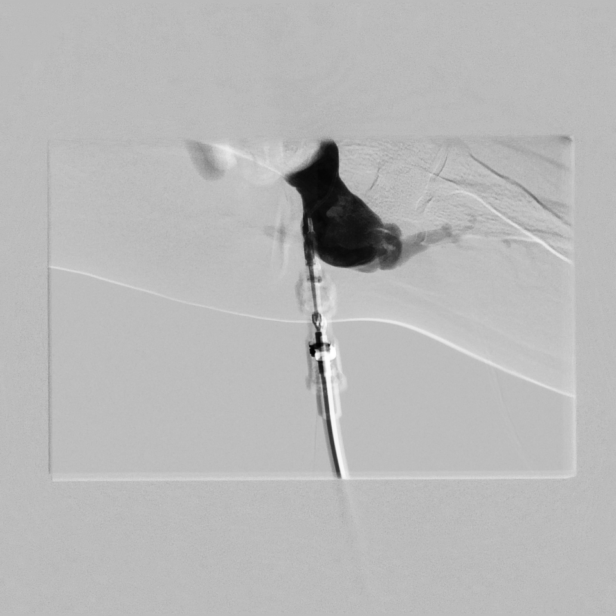
[im 17/26]
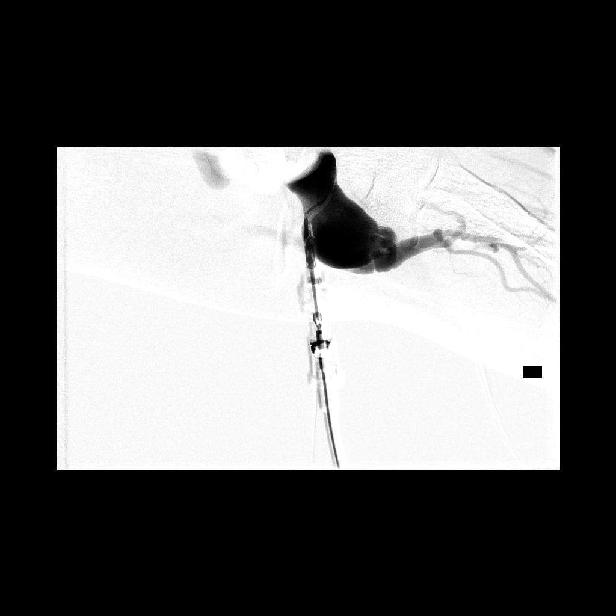
[im 26/26]
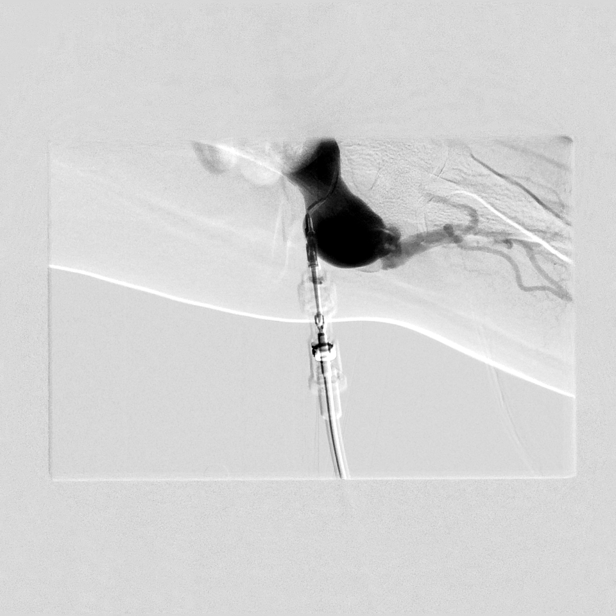

[14 of 24 positions shown; findings below may reference images not displayed]

FINDINGS: Reflux across the brachiocephalic fistula at the elbow shows this to
remain widely patent. There is aneurysmal dilatation of the outflow
cephalic vein at the level of the lower humeral shaft. The outflow
cephalic vein is patent centrally through its course. No evidence of
hemodynamically significant stenosis. No significant collateral
branches. Central venous structures through the SVC widely patent.
IMPRESSION: Widely patent left brachiocephalic AV fistula circuit.

ACCESS:
Remains approachable for percutaneous intervention as needed.

## 2020-10-04 MED FILL — AMLODIPINE BESYLATE 10 MG T: 10 | 30 days supply | Qty: 30 | Fill #1

## 2020-10-04 MED FILL — CARVEDILOL 6.25 MG TABLET: 6.25 | 30 days supply | Qty: 30 | Fill #1

## 2020-10-08 ENCOUNTER — Encounter: Payer: Self-pay | Admitting: Nephrology

## 2020-10-18 MED FILL — ETHYL CHLORIDE AERO: 30 days supply | Qty: 116 | Fill #2

## 2020-10-30 ENCOUNTER — Other Ambulatory Visit: Payer: Self-pay

## 2020-10-30 ENCOUNTER — Other Ambulatory Visit: Payer: Self-pay | Admitting: Internal Medicine

## 2020-10-30 ENCOUNTER — Ambulatory Visit: Payer: Self-pay | Attending: Internal Medicine | Admitting: Internal Medicine

## 2020-10-30 ENCOUNTER — Ambulatory Visit: Payer: Self-pay | Admitting: Internal Medicine

## 2020-10-30 ENCOUNTER — Encounter: Payer: Self-pay | Admitting: Internal Medicine

## 2020-10-30 DIAGNOSIS — F418 Other specified anxiety disorders: Secondary | ICD-10-CM | POA: Insufficient documentation

## 2020-10-30 MED ORDER — SERTRALINE HCL 25 MG PO TABS: 25.0000 mg | ORAL_TABLET | Freq: Every day | ORAL | 3 refills | Status: DC

## 2020-10-30 MED FILL — SERTRALINE HCL 25 MG TABLET: 25 | 30 days supply | Qty: 30 | Fill #0

## 2020-10-30 NOTE — Progress Notes (Signed)
Comes in for acute visit. Interpreter present.   Back pain- she points to right side of back.  Duration- 5 months Relieved with putting pressure on back Increases with any movement.  No radiation of pain to legs/arms  Sleep- she has difficulty initiating sleep. She has tried melatonin and diferent teas without success. Duration- 6+ months.   She has ESRD- on dialysis.   She admits to a low mood- this seems to have triggered the pain and the insomnia.   Past Medical History:  Diagnosis Date  . Anemia   . Anxiety   . Complication of anesthesia   . Depression    denies  . End stage renal disease ([place])    hemodialysis 3x/wk @ [address]  . Family history of adverse reaction to anesthesia    sister vomiting after uterine cyst removal  . Gastritis   . GERD (gastroesophageal reflux disease)   . Hemorrhoids   . Hypertension   . Pneumonia    years ago  . PONV (postoperative nausea and vomiting)   . UTI (lower urinary tract infection) March 2014    Social History   Socioeconomic History  . Marital status: Significant Other    Spouse name: Not on file  . Number of children: 2  . Years of education: Not on file  . Highest education level: Not on file  Occupational History  . Not on file  Tobacco Use  . Smoking status: Never Smoker  . Smokeless tobacco: Never Used  Vaping Use  . Vaping Use: Never used  Substance and Sexual Activity  . Alcohol use: No  . Drug use: No  . Sexual activity: Not Currently    Birth control/protection: Surgical  Other Topics Concern  . Not on file  Social History Narrative  . Not on file   Social Determinants of Health   Financial Resource Strain:   . Difficulty of Paying Living Expenses: Not on file  Food Insecurity:   . Worried About Charity fundraiser in the Last Year: Not on file  . Ran Out of Food in the Last Year: Not on file  Transportation Needs:   . Lack of Transportation (Medical): Not on file  . Lack of  Transportation (Non-Medical): Not on file  Physical Activity:   . Days of Exercise per Week: Not on file  . Minutes of Exercise per Session: Not on file  Stress:   . Feeling of Stress : Not on file  Social Connections:   . Frequency of Communication with Friends and Family: Not on file  . Frequency of Social Gatherings with Friends and Family: Not on file  . Attends Religious Services: Not on file  . Active Member of Clubs or Organizations: Not on file  . Attends Archivist Meetings: Not on file  . Marital Status: Not on file  Intimate Partner Violence:   . Fear of Current or Ex-Partner: Not on file  . Emotionally Abused: Not on file  . Physically Abused: Not on file  . Sexually Abused: Not on file    Past Surgical History:  Procedure Laterality Date  . AV FISTULA PLACEMENT Left 07/05/09   Dr. Kellie Simmering  . COLONOSCOPY N/A 02/10/2016   SLF: 1. HEME postive stool due to colon polyps intrernal hemorrhoids 2. small internal hemrrohoids 3. moderate external hemorrhoids.   . ESOPHAGEAL DILATION N/A 02/10/2016   Procedure: ESOPHAGEAL DILATION;  Surgeon: Danie Binder, MD;  [facility];  Service: Endoscopy;  Laterality: N/A;  . ESOPHAGOGASTRODUODENOSCOPY N/A 02/10/2016   SLF: patent Schatzki's ring , mild gastritis/ duodentitis.   Marland Kitchen FISTULA SUPERFICIALIZATION Left 45/05/2562   Procedure: PLICATION BRACHIOCEPHALIC FISTULA;  Surgeon: Angelia Mould, MD;  Location: [place];  Service: Vascular;  Laterality: Left;  . FISTULOGRAM N/A 10/22/2014   Procedure: FISTULOGRAM;  Surgeon: Angelia Mould, MD;  [facility] CATH LAB;  Service: Cardiovascular;  Laterality: N/A;  . IR DIALY SHUNT INTRO NEEDLE/INTRACATH INITIAL W/IMG LEFT Left 02/01/2019  . IR DIALY SHUNT INTRO NEEDLE/INTRACATH INITIAL W/IMG LEFT Left 06/05/2020  . PERIPHERAL VASCULAR CATHETERIZATION Left 12/09/2016   Procedure: A/V Fistulagram;  Surgeon: Waynetta Sandy, MD;  Location: [place] CV LAB;   Service: Cardiovascular;  Laterality: Left;  . REVISON OF ARTERIOVENOUS FISTULA Left 89/37/3428   Procedure: PLICATION OF BRACHIOCEPHALIC FISTULA;  Surgeon: Conrad Shelbyville, MD;  Location: [place];  Service: Vascular;  Laterality: Left;  . REVISON OF ARTERIOVENOUS FISTULA Left 08/09/2017   Procedure: REVISION OF ARTERIOVENOUS FISTULA LIGATION OF SIDE BRANCH;  Surgeon: Waynetta Sandy, MD;  Location: [place];  Service: Vascular;  Laterality: Left;  . TUBAL LIGATION  2004    Family History  Problem Relation Age of Onset  . Hypertension Mother   . Hypertension Father   . Hypertension Sister   . Breast cancer Sister 17  . Diabetes Brother   . Hypertension Brother     Allergies  Allergen Reactions  . Covid-19 (Mrna) Vaccine Swelling    Tongue swelling  . Amitriptyline Other (See Comments)    Makes her shake  . Trazodone And Nefazodone Other (See Comments)    Makes her shake    Current Outpatient Medications on File Prior to Visit  Medication Sig Dispense Refill  . acetaminophen (TYLENOL) 325 MG tablet Take 650 mg by mouth every 6 (six) hours as needed for mild pain.     Marland Kitchen amLODipine (NORVASC) 10 MG tablet Take by mouth.    Marland Kitchen amLODipine (NORVASC) 5 MG tablet Take 5 mg by mouth in the morning and at bedtime.     . B Complex-C-Folic Acid (DIALYVITE 768) 0.8 MG TABS Take 1 tablet by mouth daily.    . calcium carbonate (TUMS - DOSED IN MG ELEMENTAL CALCIUM) 500 MG chewable tablet Chew 2 tablets by mouth 3 (three) times daily with meals.    . carvedilol (COREG) 6.25 MG tablet Take 1 tablet (6.25 mg total) by mouth at bedtime. (Patient taking differently: Take 6.25 mg by mouth 2 (two) times daily with a meal. ) 180 tablet 3  . carvedilol (COREG) 6.25 MG tablet Take by mouth.    . ethyl chloride spray APPLY 1 APPLICATION TOPICALLY AS NEEDED (FOR DIALYSIS). 103.5 mL 0  . Lanthanum Carbonate (FOSRENOL) 750 MG PACK Take 750 mg by mouth 3 (three) times daily with meals.    . Methoxy  PEG-Epoetin Beta (MIRCERA IJ) Mircera    . Methoxy PEG-Epoetin Beta (MIRCERA IJ) Mircera    . omeprazole (PRILOSEC) 20 MG capsule Take 1 capsule (20 mg total) by mouth daily. 1 PO 30 mins prior to breakfast and supper (Patient taking differently: Take 20 mg by mouth daily as needed (acid reflux). ) 30 capsule 3  . omeprazole (PRILOSEC) 20 MG capsule Take by mouth.    . oxyCODONE (OXY IR/ROXICODONE) 5 MG immediate release tablet Take by mouth.    . sevelamer carbonate (RENVELA) 800 MG tablet Take by mouth.    . sucroferric oxyhydroxide (VELPHORO) 500 MG chewable  tablet Chew by mouth.     No current facility-administered medications on file prior to visit.     patient denies chest pain, shortness of breath, orthopnea. Denies lower extremity edema, abdominal pain, change in appetite, change in bowel movements. Patient denies rashes, musculoskeletal complaints. No other specific complaints in a complete review of systems.   BP 114/76   Pulse 79   Resp 16   Wt 129 lb (58.5 kg)   LMP 02/11/2012 Comment: tubal ligation  SpO2 99%   BMI 23.59 kg/m   Well-developed well-nourished female in no acute distress. HEENT exam atraumatic, normocephalic, extraocular muscles are intact. Neck is supple. No jugular venous distention no thyromegaly. Chest clear to auscultation without increased work of breathing. Cardiac exam S1 and S2 are regular. She has a radiated continuous murmur from fistula (left arm).  Abdominal exam active bowel sounds, soft, nontender. Extremities no edema. Neurologic exam she is alert without any motor sensory deficits. Gait is normal.   Depression with anxiety I think depression is the cause of her sxs.  Will start with low dose sertraline. Side effects discussed. The medication will take several weeks to work. She voices understanding.   I think treating her depression will also improve her sxs of pain

## 2020-10-30 NOTE — Addendum Note (Signed)
Addended by: Jackelyn Knife on: 10/30/2020 04:16 PM   Modules accepted: Orders

## 2020-10-30 NOTE — Addendum Note (Signed)
Addended by: Lisabeth Pick on: 10/30/2020 03:55 PM   Modules accepted: Orders

## 2020-10-30 NOTE — Assessment & Plan Note (Signed)
I think depression is the cause of her sxs.  Will start with low dose sertraline. Side effects discussed. The medication will take several weeks to work. She voices understanding.   I think treating her depression will also improve her sxs of pain

## 2020-11-08 ENCOUNTER — Other Ambulatory Visit: Payer: Self-pay | Admitting: Internal Medicine

## 2020-11-08 ENCOUNTER — Ambulatory Visit: Payer: Self-pay

## 2020-11-08 ENCOUNTER — Encounter: Payer: Self-pay | Admitting: Internal Medicine

## 2020-11-08 ENCOUNTER — Other Ambulatory Visit: Payer: Self-pay

## 2020-11-08 ENCOUNTER — Ambulatory Visit: Payer: Self-pay | Attending: Internal Medicine | Admitting: Internal Medicine

## 2020-11-08 VITALS — BP 116/68 | HR 74 | Resp 16 | Wt 123.6 lb

## 2020-11-08 DIAGNOSIS — Z1231 Encounter for screening mammogram for malignant neoplasm of breast: Secondary | ICD-10-CM

## 2020-11-08 DIAGNOSIS — N186 End stage renal disease: Secondary | ICD-10-CM

## 2020-11-08 DIAGNOSIS — Z992 Dependence on renal dialysis: Secondary | ICD-10-CM

## 2020-11-08 DIAGNOSIS — F418 Other specified anxiety disorders: Secondary | ICD-10-CM

## 2020-11-08 DIAGNOSIS — G4700 Insomnia, unspecified: Secondary | ICD-10-CM

## 2020-11-08 MED ORDER — MIRTAZAPINE 15 MG PO TABS: 7.5000 mg | ORAL_TABLET | Freq: Every day | ORAL | 1 refills | Status: DC

## 2020-11-08 MED FILL — MIRTAZAPINE 15 MG TABLET: 15 | 30 days supply | Qty: 15 | Fill #0

## 2020-11-08 NOTE — Progress Notes (Signed)
Patient ID: Sabrina Mitchell, female    DOB: [birth year]  MRN: [id number]  CC: Anxiety and Depression   Subjective: Sabrina Mitchell is a 59 y.o. female who presents for chronic ds management Her concerns today include:  59 year old withESRDon HD, HTN, ACD, GERD,vit D defand insomnia.   Patient last seen by me in 2019. Saw Dr. Leanne Chang earlier this month with complaints of right lower back pain and insomnia.  Assessed to have depressed mood and was started on Zoloft. -she stopped the Zoloft 2-3 days because it caused CP and chills when she took it. -PHQ9 score improve from 18 to 5 today.  Continues to have issues with insomnia. Gets in bed around 11 p.m.  Watches TV for about 20 mins.  She also talks with family in [place] for about 20 mins also.  Then she turns off everything. -no coffee at nights.  Drinks a herbal tea that does not have caffine. -say a prayer before bedtime trying to relax. Gets very anxious about wanting to sleep but not able to sleep. So she gets up and starts pacing around.    ESRD on HD:  Told she has parathyroid issue  HM: completed flu vaccine.  Due for MMG.   Patient Active Problem List   Diagnosis Date Noted  . Depression with anxiety 10/30/2020  . Acute respiratory failure with hypoxia ([place]) 09/27/2019  . Pulmonary edema 09/26/2019  . Chest pain 09/26/2019  . Elevated troponin 09/26/2019  . Calcaneal spur of right foot 10/10/2018  . 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 GERD (gastroesophageal reflux disease) 01/17/2016  . 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 Post-menopausal bleeding 04/06/2012  . End stage renal disease on dialysis ([place]) 04/06/2012  . Hypertension 04/06/2012     Current Outpatient Medications on File Prior to Visit  Medication Sig Dispense Refill  . acetaminophen (TYLENOL) 325 MG tablet Take 650 mg by mouth every 6 (six) hours as needed for mild pain.     Marland Kitchen  amLODipine (NORVASC) 10 MG tablet Take by mouth.    Marland Kitchen amLODipine (NORVASC) 5 MG tablet Take 5 mg by mouth in the morning and at bedtime.     . B Complex-C-Folic Acid (DIALYVITE 527) 0.8 MG TABS Take 1 tablet by mouth daily.    . calcium carbonate (TUMS - DOSED IN MG ELEMENTAL CALCIUM) 500 MG chewable tablet Chew 2 tablets by mouth 3 (three) times daily with meals.    . carvedilol (COREG) 6.25 MG tablet Take 1 tablet (6.25 mg total) by mouth at bedtime. (Patient taking differently: Take 6.25 mg by mouth 2 (two) times daily with a meal. ) 180 tablet 3  . carvedilol (COREG) 6.25 MG tablet Take by mouth.    . ethyl chloride spray APPLY 1 APPLICATION TOPICALLY AS NEEDED (FOR DIALYSIS). 103.5 mL 0  . Lanthanum Carbonate (FOSRENOL) 750 MG PACK Take 750 mg by mouth 3 (three) times daily with meals.    . Methoxy PEG-Epoetin Beta (MIRCERA IJ) Mircera    . Methoxy PEG-Epoetin Beta (MIRCERA IJ) Mircera    . omeprazole (PRILOSEC) 20 MG capsule Take 1 capsule (20 mg total) by mouth daily. 1 PO 30 mins prior to breakfast and supper (Patient taking differently: Take 20 mg by mouth daily as needed (acid reflux). ) 30 capsule 3  . omeprazole (PRILOSEC) 20 MG capsule Take by mouth.    . oxyCODONE (OXY IR/ROXICODONE) 5 MG immediate release tablet Take by mouth.    Marland Kitchen  sevelamer carbonate (RENVELA) 800 MG tablet Take by mouth.    . sucroferric oxyhydroxide (VELPHORO) 500 MG chewable tablet Chew by mouth.     No current facility-administered medications on file prior to visit.    Allergies  Allergen Reactions  . Covid-19 (Mrna) Vaccine Swelling    Tongue swelling  . Zoloft [Sertraline]     CP and chills  . Amitriptyline Other (See Comments)    Makes her shake  . Trazodone And Nefazodone Other (See Comments)    Makes her shake    Social History   Socioeconomic History  . Marital status: Significant Other    Spouse name: Not on file  . Number of children: 2  . Years of education: Not on file  . Highest  education level: Not on file  Occupational History  . Not on file  Tobacco Use  . Smoking status: Never Smoker  . Smokeless tobacco: Never Used  Vaping Use  . Vaping Use: Never used  Substance and Sexual Activity  . Alcohol use: No  . Drug use: No  . Sexual activity: Not Currently    Birth control/protection: Surgical  Other Topics Concern  . Not on file  Social History Narrative  . Not on file   Social Determinants of Health   Financial Resource Strain:   . Difficulty of Paying Living Expenses: Not on file  Food Insecurity:   . Worried About Charity fundraiser in the Last Year: Not on file  . Ran Out of Food in the Last Year: Not on file  Transportation Needs:   . Lack of Transportation (Medical): Not on file  . Lack of Transportation (Non-Medical): Not on file  Physical Activity:   . Days of Exercise per Week: Not on file  . Minutes of Exercise per Session: Not on file  Stress:   . Feeling of Stress : Not on file  Social Connections:   . Frequency of Communication with Friends and Family: Not on file  . Frequency of Social Gatherings with Friends and Family: Not on file  . Attends Religious Services: Not on file  . Active Member of Clubs or Organizations: Not on file  . Attends Archivist Meetings: Not on file  . Marital Status: Not on file  Intimate Partner Violence:   . Fear of Current or Ex-Partner: Not on file  . Emotionally Abused: Not on file  . Physically Abused: Not on file  . Sexually Abused: Not on file    Family History  Problem Relation Age of Onset  . Hypertension Mother   . Hypertension Father   . Hypertension Sister   . Breast cancer Sister 54  . Diabetes Brother   . Hypertension Brother     Past Surgical History:  Procedure Laterality Date  . AV FISTULA PLACEMENT Left 07/05/09   Dr. Kellie Simmering  . COLONOSCOPY N/A 02/10/2016   SLF: 1. HEME postive stool due to colon polyps intrernal hemorrhoids 2. small internal hemrrohoids 3. moderate  external hemorrhoids.   . ESOPHAGEAL DILATION N/A 02/10/2016   Procedure: ESOPHAGEAL DILATION;  Surgeon: Danie Binder, MD;  [facility];  Service: Endoscopy;  Laterality: N/A;  . ESOPHAGOGASTRODUODENOSCOPY N/A 02/10/2016   SLF: patent Schatzki's ring , mild gastritis/ duodentitis.   Marland Kitchen FISTULA SUPERFICIALIZATION Left 86/06/6194   Procedure: PLICATION BRACHIOCEPHALIC FISTULA;  Surgeon: Angelia Mould, MD;  Location: [place];  Service: Vascular;  Laterality: Left;  . FISTULOGRAM N/A 10/22/2014   Procedure: FISTULOGRAM;  Surgeon: Angelia Mould, MD;  Location[facility] CATH LAB;  Service: Cardiovascular;  Laterality: N/A;  . IR DIALY SHUNT INTRO NEEDLE/INTRACATH INITIAL W/IMG LEFT Left 02/01/2019  . IR DIALY SHUNT INTRO NEEDLE/INTRACATH INITIAL W/IMG LEFT Left 06/05/2020  . PERIPHERAL VASCULAR CATHETERIZATION Left 12/09/2016   Procedure: A/V Fistulagram;  Surgeon: Waynetta Sandy, MD;  Location: [place] CV LAB;  Service: Cardiovascular;  Laterality: Left;  . REVISON OF ARTERIOVENOUS FISTULA Left 92/42/6834   Procedure: PLICATION OF BRACHIOCEPHALIC FISTULA;  Surgeon: Conrad Los Prados, MD;  Location: [place];  Service: Vascular;  Laterality: Left;  . REVISON OF ARTERIOVENOUS FISTULA Left 08/09/2017   Procedure: REVISION OF ARTERIOVENOUS FISTULA LIGATION OF SIDE BRANCH;  Surgeon: Waynetta Sandy, MD;  Location: [place];  Service: Vascular;  Laterality: Left;  . TUBAL LIGATION  2004    ROS: Review of Systems Negative except as stated above  PHYSICAL EXAM: BP 116/68   Pulse 74   Resp 16   Wt 123 lb 9.6 oz (56.1 kg)   LMP 02/11/2012 Comment: tubal ligation  SpO2 98%   BMI 22.61 kg/m   Wt Readings from Last 3 Encounters:  11/08/20 123 lb 9.6 oz (56.1 kg)  10/30/20 129 lb (58.5 kg)  06/05/20 125 lb (56.7 kg)    Physical Exam  General appearance - alert, well appearing, and in no distress Mental status - normal mood, behavior, speech, dress, motor activity,  and thought processes Chest - clear to auscultation, no wheezes, rales or rhonchi, symmetric air entry Heart -RRR Extremities -no LE edema  Depression screen [facility] 2/9 11/08/2020 10/30/2020 10/10/2018  Decreased Interest 0 2 0  Down, Depressed, Hopeless 1 2 0  PHQ - 2 Score 1 4 0  Altered sleeping 3 3 -  Tired, decreased energy 1 3 -  Change in appetite 0 0 -  Feeling bad or failure about yourself  0 0 -  Trouble concentrating 0 0 -  Moving slowly or fidgety/restless 0 2 -  Suicidal thoughts 0 0 -  PHQ-9 Score 5 12 -   GAD 7 : Generalized Anxiety Score 11/08/2020 10/10/2018 06/10/2018 03/11/2018  Nervous, Anxious, on Edge 1 0 1 0  Control/stop worrying 0 2 0 1  Worry too much - different things 1 2 0 0  Trouble relaxing 1 2 1 1   Restless 1 0 1 1  Easily annoyed or irritable 0 2 0 1  Afraid - awful might happen 2 0 (No Data) 1  Total GAD 7 Score 6 8 - 5     CMP Latest Ref Rng & Units 03/27/2020 03/12/2020 10/11/2019  Glucose 65 - 99 mg/dL 77 88 87  BUN 6 - 24 mg/dL 46(H) 26(H) 32(H)  Creatinine 0.57 - 1.00 mg/dL 7.20(H) 4.83(H) 9.30(H)  Sodium 134 - 144 mmol/L 140 134(L) 137  Potassium 3.5 - 5.2 mmol/L 4.0 3.5 3.5  Chloride 96 - 106 mmol/L 94(L) 92(L) 93(L)  CO2 20 - 29 mmol/L 26 29 -  Calcium 8.7 - 10.2 mg/dL 9.8 9.2 -  Total Protein 6.5 - 8.1 g/dL - - -  Total Bilirubin 0.3 - 1.2 mg/dL - - -  Alkaline Phos 38 - 126 U/L - - -  AST 15 - 41 U/L - - -  ALT 0 - 44 U/L - - -   Lipid Panel     Component Value Date/Time   CHOL 140 03/11/2018 1208   TRIG 163 (H) 03/11/2018 1208   HDL 60 03/11/2018 1208  CHOLHDL 2.3 03/11/2018 1208   CHOLHDL 2.8 Ratio 10/22/2010 2104   VLDL 30 10/22/2010 2104   LDLCALC 47 03/11/2018 1208    CBC    Component Value Date/Time   WBC 6.7 03/27/2020 1347   WBC 10.2 03/12/2020 1719   RBC 3.62 (L) 03/27/2020 1347   RBC 3.84 (L) 03/12/2020 1719   HGB 11.6 03/27/2020 1347   HCT 34.5 03/27/2020 1347   PLT 160 03/27/2020 1347   MCV 95  03/27/2020 1347   MCH 32.0 03/27/2020 1347   MCH 31.3 03/12/2020 1719   MCHC 33.6 03/27/2020 1347   MCHC 33.7 03/12/2020 1719   RDW 15.2 03/27/2020 1347   LYMPHSABS 1.1 03/27/2020 1347   MONOABS 0.4 09/26/2019 0702   EOSABS 0.1 03/27/2020 1347   BASOSABS 0.0 03/27/2020 1347    ASSESSMENT AND PLAN: 1. Insomnia, unspecified type Good sleep hygiene discussed and encouraged. We will try her with a low-dose of Remeron at bedtime to see if it helps decrease the anxiety and helps her fall asleep more easily. Advised to stop the medication if it causes any mental confusion for her. - mirtazapine (REMERON) 15 MG tablet; Take 0.5 tablets (7.5 mg total) by mouth at bedtime.  Dispense: 30 tablet; Refill: 1  2. Depression with anxiety See #1 above - mirtazapine (REMERON) 15 MG tablet; Take 0.5 tablets (7.5 mg total) by mouth at bedtime.  Dispense: 30 tablet; Refill: 1  3. ESRD on hemodialysis [facility]) Patient on dialysis 3 days a week.  4. Encounter for screening mammogram for malignant neoplasm of breast - MM Digital Screening; Future    Patient was given the opportunity to ask questions.  Patient verbalized understanding of the plan and was able to repeat key elements of the plan.  [employer] interpreter used during this encounter. #[id number]   Orders Placed This Encounter  Procedures  . MM Digital Screening     Requested Prescriptions   Signed Prescriptions Disp Refills  . mirtazapine (REMERON) 15 MG tablet 30 tablet 1    Sig: Take 0.5 tablets (7.5 mg total) by mouth at bedtime.    No follow-ups on file.  Karle Plumber, MD, FACP

## 2020-11-08 NOTE — Progress Notes (Signed)
Pt states she is not taking the zoloft

## 2020-11-26 ENCOUNTER — Telehealth: Payer: Self-pay | Admitting: Internal Medicine

## 2020-11-26 NOTE — Telephone Encounter (Signed)
Pt was sent a letter from financial dept. Inform them, that the application they submitted was incomplete, since they were missing some documentation at the time of the appointment, Pt need to reschedule and resubmit all new papers and application for CAFA and [place], P.S. old documents has been sent back by mail to the Pt and Pt. need to make a new appt. 

## 2020-11-27 MED FILL — CARVEDILOL 6.25 MG TABLET: 6.25 | 30 days supply | Qty: 30 | Fill #2

## 2020-11-27 MED FILL — AMLODIPINE BESYLATE 10 MG T: 10 | 30 days supply | Qty: 30 | Fill #2

## 2020-12-04 ENCOUNTER — Other Ambulatory Visit: Payer: Self-pay

## 2020-12-04 ENCOUNTER — Ambulatory Visit: Payer: Self-pay | Attending: Internal Medicine

## 2020-12-16 MED FILL — ETHYL CHLORIDE AERO: 30 days supply | Qty: 116 | Fill #3

## 2020-12-24 ENCOUNTER — Telehealth: Payer: Self-pay | Admitting: Internal Medicine

## 2020-12-24 NOTE — Telephone Encounter (Signed)
Pt return call, Pt was inform of the missing documents, she need a new letter of support with the address and phone number of the person who is helping her, pt understood and if possible she will bring it this week

## 2020-12-24 NOTE — Telephone Encounter (Signed)
I called the Pt to call me back urgent since she is missing documents to complete her CAFA application, please contact me at [phone]

## 2021-01-20 MED FILL — AMLODIPINE BESYLATE 10 MG T: 10 | 30 days supply | Qty: 30 | Fill #3

## 2021-01-20 MED FILL — MIRTAZAPINE 15 MG TABLET: 15 | 30 days supply | Qty: 15 | Fill #1

## 2021-01-22 MED FILL — ETHYL CHLORIDE AERO: 30 days supply | Qty: 116 | Fill #4

## 2021-01-27 ENCOUNTER — Encounter: Payer: Self-pay | Admitting: Internal Medicine

## 2021-01-30 ENCOUNTER — Other Ambulatory Visit: Payer: Self-pay | Admitting: Nephrology

## 2021-01-30 MED FILL — CARVEDILOL 6.25 MG TABLET: 6.25 | 30 days supply | Qty: 30 | Fill #0

## 2021-02-11 ENCOUNTER — Telehealth: Payer: Self-pay

## 2021-02-11 NOTE — Telephone Encounter (Signed)
Contacted patient by phone with Spanish Interpreter ID# [phone] to share update information for an upcoming appt. The interpreter left a voicemail with the information for the patient.

## 2021-02-12 ENCOUNTER — Ambulatory Visit: Payer: Self-pay | Admitting: Physician Assistant

## 2021-02-12 ENCOUNTER — Telehealth: Payer: Self-pay | Admitting: Internal Medicine

## 2021-02-12 VITALS — BP 111/81 | HR 90 | Temp 98.7°F | Resp 18

## 2021-02-12 DIAGNOSIS — F5104 Psychophysiologic insomnia: Secondary | ICD-10-CM

## 2021-02-12 DIAGNOSIS — R519 Headache, unspecified: Secondary | ICD-10-CM

## 2021-02-12 MED ORDER — ZOLPIDEM TARTRATE 5 MG PO TABS: 5.0000 mg | ORAL_TABLET | Freq: Every evening | ORAL | 0 refills | Status: DC | PRN

## 2021-02-12 NOTE — Telephone Encounter (Signed)
Patient would like her [place] letter. Please f/u

## 2021-02-12 NOTE — Telephone Encounter (Signed)
I return Pt call, LVM to call back

## 2021-02-12 NOTE — Progress Notes (Unsigned)
Established Patient Office Visit  Subjective:  Patient ID: Sabrina Mitchell, female    DOB: 1961/10/13  Age: 60 y.o. MRN: [id number]  CC:  Chief Complaint  Patient presents with  . Headache    HPI Sabrina Mitchell reports that she has been having difficulty sleeping, states that she feels it is making her eyes hurt, giving her a headache.  Reports that she has previously failed trazodone, states that she was prescribed 7.5 mg of Remeron, states that this was ineffective, states that she took 15 mg on her own, states that she felt it made her have chest pain.  Reports she is getting minimal sleep.  Due to language barrier, an interpreter was present during the history-taking and subsequent discussion (and for part of the physical exam) with this patient.      Past Medical History:  Diagnosis Date  . Anemia   . Anxiety   . Complication of anesthesia   . Depression    denies  . End stage renal disease ([place])    hemodialysis 3x/wk @ [address]  . Family history of adverse reaction to anesthesia    sister vomiting after uterine cyst removal  . Gastritis   . GERD (gastroesophageal reflux disease)   . Hemorrhoids   . Hypertension   . Pneumonia    years ago  . PONV (postoperative nausea and vomiting)   . UTI (lower urinary tract infection) March 2014    Past Surgical History:  Procedure Laterality Date  . AV FISTULA PLACEMENT Left 07/05/09   Dr. Kellie Simmering  . COLONOSCOPY N/A 02/10/2016   SLF: 1. HEME postive stool due to colon polyps intrernal hemorrhoids 2. small internal hemrrohoids 3. moderate external hemorrhoids.   . ESOPHAGEAL DILATION N/A 02/10/2016   Procedure: ESOPHAGEAL DILATION;  Surgeon: Danie Binder, MD;  [facility];  Service: Endoscopy;  Laterality: N/A;  . ESOPHAGOGASTRODUODENOSCOPY N/A 02/10/2016   SLF: patent Schatzki's ring , mild gastritis/ duodentitis.   Marland Kitchen FISTULA SUPERFICIALIZATION Left 41/08/6221   Procedure: PLICATION  BRACHIOCEPHALIC FISTULA;  Surgeon: Angelia Mould, MD;  Location: [place];  Service: Vascular;  Laterality: Left;  . FISTULOGRAM N/A 10/22/2014   Procedure: FISTULOGRAM;  Surgeon: Angelia Mould, MD;  Location: [facility] CATH LAB;  Service: Cardiovascular;  Laterality: N/A;  . IR DIALY SHUNT INTRO NEEDLE/INTRACATH INITIAL W/IMG LEFT Left 02/01/2019  . IR DIALY SHUNT INTRO NEEDLE/INTRACATH INITIAL W/IMG LEFT Left 06/05/2020  . PERIPHERAL VASCULAR CATHETERIZATION Left 12/09/2016   Procedure: A/V Fistulagram;  Surgeon: Waynetta Sandy, MD;  Location: [place] CV LAB;  Service: Cardiovascular;  Laterality: Left;  . REVISON OF ARTERIOVENOUS FISTULA Left 97/98/9211   Procedure: PLICATION OF BRACHIOCEPHALIC FISTULA;  Surgeon: Conrad [place], MD;  Location: [place];  Service: Vascular;  Laterality: Left;  . REVISON OF ARTERIOVENOUS FISTULA Left 08/09/2017   Procedure: REVISION OF ARTERIOVENOUS FISTULA LIGATION OF SIDE BRANCH;  Surgeon: Waynetta Sandy, MD;  Location: [place];  Service: Vascular;  Laterality: Left;  . TUBAL LIGATION  2004    Family History  Problem Relation Age of Onset  . Hypertension Mother   . Hypertension Father   . Hypertension Sister   . Breast cancer Sister 45  . Diabetes Brother   . Hypertension Brother     Social History   Socioeconomic History  . Marital status: Significant Other    Spouse name: Not on file  . Number of children: 2  . Years of education: Not on  file  . Highest education level: Not on file  Occupational History  . Not on file  Tobacco Use  . Smoking status: Never Smoker  . Smokeless tobacco: Never Used  Vaping Use  . Vaping Use: Never used  Substance and Sexual Activity  . Alcohol use: No  . Drug use: No  . Sexual activity: Not Currently    Birth control/protection: Surgical  Other Topics Concern  . Not on file  Social History Narrative  . Not on file   Social Determinants of Health   Financial Resource Strain: Not  on file  Food Insecurity: Not on file  Transportation Needs: Not on file  Physical Activity: Not on file  Stress: Not on file  Social Connections: Not on file  Intimate Partner Violence: Not on file    Outpatient Medications Prior to Visit  Medication Sig Dispense Refill  . acetaminophen (TYLENOL) 325 MG tablet Take 650 mg by mouth every 6 (six) hours as needed for mild pain.     Marland Kitchen amLODipine (NORVASC) 10 MG tablet Take by mouth.    Marland Kitchen amLODipine (NORVASC) 5 MG tablet Take 5 mg by mouth in the morning and at bedtime.     . B Complex-C-Folic Acid (DIALYVITE 191) 0.8 MG TABS Take 1 tablet by mouth daily.    . calcium carbonate (TUMS - DOSED IN MG ELEMENTAL CALCIUM) 500 MG chewable tablet Chew 2 tablets by mouth 3 (three) times daily with meals.    . carvedilol (COREG) 6.25 MG tablet Take 1 tablet (6.25 mg total) by mouth at bedtime. (Patient taking differently: Take 6.25 mg by mouth 2 (two) times daily with a meal.) 180 tablet 3  . carvedilol (COREG) 6.25 MG tablet Take by mouth.    . ethyl chloride spray APPLY 1 APPLICATION TOPICALLY AS NEEDED (FOR DIALYSIS). 103.5 mL 0  . Methoxy PEG-Epoetin Beta (MIRCERA IJ) Mircera    . Methoxy PEG-Epoetin Beta (MIRCERA IJ) Mircera    . mirtazapine (REMERON) 15 MG tablet Take 0.5 tablets (7.5 mg total) by mouth at bedtime. 30 tablet 1  . omeprazole (PRILOSEC) 20 MG capsule Take 1 capsule (20 mg total) by mouth daily. 1 PO 30 mins prior to breakfast and supper (Patient taking differently: Take 20 mg by mouth daily as needed (acid reflux).) 30 capsule 3  . omeprazole (PRILOSEC) 20 MG capsule Take by mouth.    . oxyCODONE (OXY IR/ROXICODONE) 5 MG immediate release tablet Take by mouth.    . sevelamer carbonate (RENVELA) 800 MG tablet Take by mouth.    . sucroferric oxyhydroxide (VELPHORO) 500 MG chewable tablet Chew by mouth.    . Lanthanum Carbonate (FOSRENOL) 750 MG PACK Take 750 mg by mouth 3 (three) times daily with meals. (Patient not taking: Reported  on 02/12/2021)     No facility-administered medications prior to visit.    Allergies  Allergen Reactions  . Covid-19 (Mrna) Vaccine Swelling    Tongue swelling  . Zoloft [Sertraline]     CP and chills  . Amitriptyline Other (See Comments)    Makes her shake  . Trazodone And Nefazodone Other (See Comments)    Makes her shake    ROS Review of Systems  Constitutional: Positive for fatigue.  HENT: Negative.   Eyes: Negative.   Respiratory: Negative.   Cardiovascular: Negative.   Gastrointestinal: Negative.   Endocrine: Negative.   Genitourinary: Negative.   Musculoskeletal: Negative.   Skin: Negative.   Allergic/Immunologic: Negative.   Neurological: Positive for headaches. Negative  for dizziness, syncope and weakness.  Hematological: Negative.   Psychiatric/Behavioral: Positive for sleep disturbance.      Objective:    Physical Exam Vitals and nursing note reviewed.  Constitutional:      General: She is not in acute distress.    Appearance: She is well-developed. She is not ill-appearing.  HENT:     Head: Normocephalic and atraumatic.     Mouth/Throat:     Mouth: Mucous membranes are moist.     Pharynx: Oropharynx is clear.  Eyes:     Extraocular Movements: Extraocular movements intact.     Pupils: Pupils are equal, round, and reactive to light.  Cardiovascular:     Rate and Rhythm: Normal rate and regular rhythm.     Heart sounds: Normal heart sounds.  Pulmonary:     Effort: Pulmonary effort is normal.     Breath sounds: Normal breath sounds.  Musculoskeletal:        General: Normal range of motion.     Cervical back: Normal range of motion and neck supple.  Skin:    General: Skin is warm and dry.     Comments: Dialysis port present  Neurological:     Mental Status: She is alert and oriented to person, place, and time.  Psychiatric:        Mood and Affect: Mood normal.        Behavior: Behavior normal.     BP 111/81   Pulse 90   Temp 98.7 F (37.1  C)   Resp 18   LMP 02/11/2012 Comment: tubal ligation  SpO2 97%  Wt Readings from Last 3 Encounters:  11/08/20 123 lb 9.6 oz (56.1 kg)  10/30/20 129 lb (58.5 kg)  06/05/20 125 lb (56.7 kg)     Health Maintenance Due  Topic Date Due  . URINE MICROALBUMIN  03/25/2010  . COVID-19 Vaccine (2 - Pfizer 3-dose series) 04/02/2020  . MAMMOGRAM  06/28/2020  . PAP SMEAR-Modifier  12/10/2020    There are no preventive care reminders to display for this patient.  Lab Results  Component Value Date   TSH 0.764 03/27/2020   Lab Results  Component Value Date   WBC 6.7 03/27/2020   HGB 11.6 03/27/2020   HCT 34.5 03/27/2020   MCV 95 03/27/2020   PLT 160 03/27/2020   Lab Results  Component Value Date   NA 140 03/27/2020   K 4.0 03/27/2020   CO2 26 03/27/2020   GLUCOSE 77 03/27/2020   BUN 46 (H) 03/27/2020   CREATININE 7.20 (H) 03/27/2020   BILITOT 0.6 09/26/2019   ALKPHOS 88 09/26/2019   AST 39 09/26/2019   ALT 33 09/26/2019   PROT 6.9 09/26/2019   ALBUMIN 3.4 (L) 09/27/2019   CALCIUM 9.8 03/27/2020   ANIONGAP 13 03/12/2020   Lab Results  Component Value Date   CHOL 140 03/11/2018   Lab Results  Component Value Date   HDL 60 03/11/2018   Lab Results  Component Value Date   LDLCALC 47 03/11/2018   Lab Results  Component Value Date   TRIG 163 (H) 03/11/2018   Lab Results  Component Value Date   CHOLHDL 2.3 03/11/2018   Lab Results  Component Value Date   HGBA1C 4.8 03/22/2017      Assessment & Plan:   Problem List Items Addressed This Visit      Other   Psychophysiological insomnia - Primary   Relevant Medications   zolpidem (AMBIEN) 5 MG tablet  New onset of headaches     1. Psychophysiological insomnia Stop Remeron, Trial Ambien 5 mg.  Patient education given on strict precautions with this medication.  Only take as directed.  Patient has dialysis thrice weekly, check of up-to-date shows that this medication is fine to use with dialysis  patients.  Patient is adamant about being referred to neurology for evaluation because her "friend had same situation where she was unable to sleep, went to see neurology and was told she did not have enough fluid on her brain and was given a small pill with relief".  Explained to patient that we will try this medication first prior to referring to neurology.  Patient to follow-up with mobile medicine unit in 2 weeks - zolpidem (AMBIEN) 5 MG tablet; Take 1 tablet (5 mg total) by mouth at bedtime as needed for sleep.  Dispense: 15 tablet; Refill: 0  2. New onset of headaches   Meds ordered this encounter  Medications  . DISCONTD: zolpidem (AMBIEN) 5 MG tablet    Sig: Take 1 tablet (5 mg total) by mouth at bedtime as needed for sleep.    Dispense:  15 tablet    Refill:  0    Order Specific Question:   Supervising Provider    Answer:   Joya Gaskins, PATRICK E [[id number]]  . zolpidem (AMBIEN) 5 MG tablet    Sig: Take 1 tablet (5 mg total) by mouth at bedtime as needed for sleep.    Dispense:  15 tablet    Refill:  0    Order Specific Question:   Supervising Provider    Answer:   Elsie Stain [[id number]]    I have reviewed the patient's medical history (PMH, PSH, Social History, Family History, Medications, and allergies) , and have been updated if relevant. I spent 30 minutes reviewing chart and  face to face time with patient.     Follow-up: Return in about 2 weeks (around 02/26/2021).    Loraine Grip Liah Morr, PA-C

## 2021-02-12 NOTE — Patient Instructions (Signed)
You will stop taking Remeron, and start taking Ambien at bedtime.  You will follow-up with the mobile medicine unit in 2 weeks, please be fasting so we can complete your labs at that time.  Kennieth Rad, PA-C Physician Assistant [facility] [email or web address]  Zolpidem Tablets Qu es este medicamento? El ZOLPIDEM se [place] para tratar el insomnio. Este USAA ayuda conciliar el sueo y dormir toda la noche. Este medicamento puede ser utilizado para otros usos; si tiene alguna pregunta consulte con su proveedor de atencin mdica o con su farmacutico. MARCAS COMUNES: Ambien Qu le debo informar a mi profesional de la salud antes de tomar este medicamento? Necesitan saber si usted presenta alguno de los [employer] o situaciones: depresin antecedentes de abuso de drogas o drogadiccin si bebe alcohol con frecuencia enfermedad heptica enfermedad pulmonar o respiratoria miastenia grave apnea del sueo caminar (sonambulismo), conducir, comer o hacer otra actividad mientras no est completamente despierto despus de tomar un medicamento para dormir ideas, planes o intento de suicidio; si usted o alguien de su familia ha intentado suicidarse previamente una reaccin alrgica o inusual al zolpidem, a otros medicamentos, alimentos, colorantes o conservantes si est embarazada o buscando quedar embarazada si est amamantando a un beb Cmo debo utilizar este medicamento? Tome este medicamento por va oral con un vaso de agua. Siga las instrucciones de la etiqueta del [place]. Es mejor tomar este medicamento con el estmago vaco y solo cuando est listo para Acupuncturist. No tome su medicamento con una frecuencia mayor a la indicada. Si ha estado tomando este medicamento por varias semanas y deja de tomarlo repentinamente, es posible que sufra sntomas desagradables de abstinencia. Es posible que su mdico o a su profesional de  la salud desee ir reduciendo gradualmente la dosis. No deje de tomar este medicamento por su cuenta. Siempre siga las recomendaciones de su mdico o su profesional de Technical sales engineer. Su farmacutico le dar una Gua del medicamento especial (MedGuide, nombre en ingls) con cada receta y en cada ocasin que la vuelva a surtir. Asegrese de leer esta informacin cada vez cuidadosamente. Hable con su pediatra para informarse acerca del uso de este medicamento en nios. Puede requerir atencin especial. Sobredosis: Pngase en contacto inmediatamente con un centro toxicolgico o una sala de urgencia si usted cree que haya tomado demasiado medicamento. ATENCIN: [employer] es solo para usted. No comparta este medicamento con nadie. Qu sucede si me olvido de una dosis? No se aplica en este caso. Este medicamento slo debe tomarse inmediatamente antes de irse a dormir. No tome dosis adicionales o dobles. Qu puede interactuar con este medicamento? alcohol antihistamnicos para [place], tos y resfro ciertos medicamentos para la ansiedad o para conciliar el sueo ciertos medicamentos para la depresin, tales como amitriptilina, fluoxetina, sertralina ciertos medicamentos para infecciones micticas, tales como itraconazol y Arboriculturist ciertos medicamentos para convulsiones, tales como fenobarbital, primidona ciprofloxacina suplementos dietticos para dormir, tales como valeriana o kava kava anestsicos generales, tales como halotano, isoflurano, metoxiflurano, propofol anestsicos locales, tales como lidocana, pramoxina, tetracana medicamentos para relajar los msculos antes de una ciruga medicamentos narcticos para Conservation officer, historic buildings fenotiazinas, tales como clorpromazina, mesoridazina, Government social research officer, tioridazina rifampicina Puede ser que esta lista no menciona todas las posibles interacciones. Informe a su profesional de [employer]	[place] de [employer] productos a base de hierbas, medicamentos de [place] o suplementos  nutritivos que est tomando. Si usted fuma, consume bebidas alcohlicas o si utiliza drogas ilegales, indqueselo tambin a su profesional de la  salud. Algunas sustancias pueden interactuar con su medicamento. A qu debo estar atento al usar Coca[employer]Cola? Visite a su mdico o a su profesional de la salud para que revise regularmente su evolucin. Mantenga un horario de sueo regular al [employer] todas las noches a aproximadamente la misma hora. Evite las bebidas que contengan cafena durante la tarde. Cuando se usan medicamentos para dormir todas las noches durante ms de unas pocas semanas, es posible que dejen de funcionar. Hable con su mdico si todava tiene problemas para dormir. Despus de [employer], es posible que se levante de la cama y que haga una actividad que no sepa que est haciendo. La maana siguiente, quizs no lo recuerde. Las actividades incluyen conducir un automvil ("conducir dormido"), cocinar y comer, hablar por telfono, tener actividades sexuales y Writer dormido. Han ocurrido lesiones graves. Deje de Environmental consultant y llame a su mdico de inmediato si descubre que ha hecho alguna de esas actividades. No use este medicamento si bebi alcohol esa noche. No lo use si ya ha tomado otro medicamento para dormir. El riesgo de Optometrist estas actividades mientras est dormido ser mayor. Espere durante al menos 8 horas despus de tomar una dosis antes de conducir o Optometrist otras actividades que requieren Personnel officer en estado de alerta total. No use este medicamento a menos que Education administrator en la cama durante toda la noche (7 a 8 horas) antes de que deba regresar a [employer]. Es posible que tenga una disminucin de su nivel de Hydrographic surveyor mental el da despus del uso, incluso si siente que est totalmente despierto. Informe a su mdico si necesitar realizar actividades que requieran que est totalmente alerta, tales como conducir, Games developer siguiente. No se siente ni se ponga  de pie con rapidez despus de usar este medicamento, especialmente si es un paciente de edad avanzada. Esto reduce el riesgo de mareos o [employer]. Si usted o su familia notan algn cambio en su conducta, como empeoramiento de la depresin, ideas de [place], ansiedad, otros pensamientos inusuales o perturbadores, o prdida de [place], llame a su mdico de inmediato. Despus de dejar de usar Coca[employer]Cola, es posible que tenga problemas para conciliar el sueo. Esto se llama insomnio rebote. Este problema generalmente desaparece por s solo despus de 1 o 2 noches. Qu efectos secundarios puedo tener al [employer] este medicamento? Efectos secundarios que debe informar a su mdico o a Barrister's clerk de la salud tan pronto como sea posible: Chief of Staff, como erupcin cutnea, comezn/picazn o urticaria, e hinchazn de la cara, los labios o la lengua problemas respiratorios cambios en la visin confusin estado de nimo deprimido u otros cambios en el humor o las emociones sensacin de Secondary school teacher o aturdimiento, cadas alucinaciones prdida de equilibrio o coordinacin prdida de memoria entumecimiento u hormigueo de Barrister's clerk inquietud, excitabilidad o sensacin de Air cabin crew o ansiedad signos y sntomas de lesin al hgado, tales como orina amarilla oscura o Forensic psychologist; sensacin general de estar enfermo o sntomas gripales; heces claras; prdida del apetito; nuseas; dolor en la regin abdominal superior derecha; cansancio o debilidad inusual; color amarillento de los ojos o la piel ideas suicidas actividades inusuales mientras no est totalmente despierto, como conducir, comer, realizar llamadas telefnicas o actividad sexual Efectos secundarios que generalmente no requieren atencin mdica (infrmelos a su mdico o a Barrister's clerk de la salud si persisten o si son molestos): Teacher, English as a foreign language despus de usar este medicamento dolor de Pensions consultant Puede ser que [place]  no menciona todos los  posibles efectos secundarios. Comunquese a su mdico por asesoramiento mdico [employer] Inc. Usted puede informar los efectos secundarios a la FDA por telfono al 1-800-FDA-1088. Dnde debo guardar mi medicina? Mantenga fuera del alcance de los nios. Existe la posibilidad de abusar de Coca-Cola. Mantenga su medicamento en un lugar seguro para protegerlo contra robos. No comparta este medicamento con nadie. Es [place], y est prohibido por la ley. Este medicamento puede causar Ardelia Mems sobredosis accidental y la muerte si lo toman otros adultos, nios o Copy. Mezcle todo el medicamento sin usar con una sustancia como piedras sanitarias para gatos o posos (residuos) de caf. Luego deseche el [employer] un recipiente cerrado, como una bolsa sellada o una lata de caf con tapa. No utilice este medicamento despus de la fecha de vencimiento. Guarde a [employer], entre 20 y 48 grados Celsius (72 y 39 grados Fahrenheit). ATENCIN: Este folleto es un resumen. Puede ser que no cubra toda la posible informacin. Si usted tiene preguntas acerca de esta medicina, consulte con su mdico, su farmacutico o su profesional de Technical sales engineer.  2021 Elsevier/Gold Standard (2018-10-24 00:00:00)

## 2021-02-12 NOTE — Progress Notes (Signed)
Patient has not eaten today or taken medication. Patient complains of HA keeping her up a night. Pain is located in the left eye and head. Patient

## 2021-02-13 DIAGNOSIS — F5104 Psychophysiologic insomnia: Secondary | ICD-10-CM | POA: Insufficient documentation

## 2021-02-13 DIAGNOSIS — R519 Headache, unspecified: Secondary | ICD-10-CM | POA: Insufficient documentation

## 2021-02-20 ENCOUNTER — Other Ambulatory Visit: Payer: Self-pay | Admitting: Nephrology

## 2021-02-20 MED FILL — BENZONATATE 100 MG CAPS: 100 | 10 days supply | Qty: 20 | Fill #0

## 2021-02-20 MED FILL — ALBUTEROL SULFATE HFA 108 (: 108 (90 BAS | 25 days supply | Qty: 18 | Fill #0

## 2021-02-20 MED FILL — AMOX-CLAV 500-125 MG TABLET: 500-125 | 7 days supply | Qty: 7 | Fill #0

## 2021-02-22 ENCOUNTER — Other Ambulatory Visit: Payer: Self-pay

## 2021-02-22 ENCOUNTER — Emergency Department (HOSPITAL_COMMUNITY): Payer: Self-pay

## 2021-02-22 ENCOUNTER — Emergency Department (HOSPITAL_COMMUNITY)
Admission: EM | Admit: 2021-02-22 | Discharge: 2021-02-23 | Disposition: A | Discharge status: Home / Self Care | Payer: Self-pay | Attending: Emergency Medicine | Admitting: Emergency Medicine

## 2021-02-22 ENCOUNTER — Encounter (HOSPITAL_COMMUNITY): Payer: Self-pay | Admitting: Emergency Medicine

## 2021-02-22 DIAGNOSIS — Z992 Dependence on renal dialysis: Secondary | ICD-10-CM | POA: Insufficient documentation

## 2021-02-22 DIAGNOSIS — R079 Chest pain, unspecified: Secondary | ICD-10-CM

## 2021-02-22 DIAGNOSIS — I12 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 disease: Secondary | ICD-10-CM | POA: Insufficient documentation

## 2021-02-22 DIAGNOSIS — N186 End stage renal disease: Secondary | ICD-10-CM | POA: Insufficient documentation

## 2021-02-22 DIAGNOSIS — Z79899 Other long term (current) drug therapy: Secondary | ICD-10-CM | POA: Insufficient documentation

## 2021-02-22 DIAGNOSIS — J209 Acute bronchitis, unspecified: Secondary | ICD-10-CM | POA: Insufficient documentation

## 2021-02-22 DIAGNOSIS — Z20822 Contact with and (suspected) exposure to covid-19: Secondary | ICD-10-CM | POA: Insufficient documentation

## 2021-02-22 LAB — TROPONIN I (HIGH SENSITIVITY)
Troponin I (High Sensitivity): 8 ng/L (ref <18)
Troponin I (High Sensitivity): 9 ng/L (ref <18)

## 2021-02-22 LAB — BASIC METABOLIC PANEL
Anion gap: 14 (ref 5–15)
BUN: 17 mg/dL (ref 6–20)
CO2: 28 mmol/L (ref 22–32)
Calcium: 9.8 mg/dL (ref 8.9–10.3)
Chloride: 94 mmol/L — ABNORMAL LOW (ref 98–111)
Creatinine, Ser: 4.04 mg/dL — ABNORMAL HIGH (ref 0.44–1.00)
GFR, Estimated: 12 mL/min — ABNORMAL LOW (ref >60)
Glucose, Bld: 97 mg/dL (ref 70–99)
Potassium: 2.8 mmol/L — ABNORMAL LOW (ref 3.5–5.1)
Sodium: 136 mmol/L (ref 135–145)

## 2021-02-22 LAB — CBC
HCT: 36.1 % (ref 36.0–46.0)
Hemoglobin: 11.8 g/dL — ABNORMAL LOW (ref 12.0–15.0)
MCH: 31.4 pg (ref 26.0–34.0)
MCHC: 32.7 g/dL (ref 30.0–36.0)
MCV: 96 fL (ref 80.0–100.0)
Platelets: 178 10*3/uL (ref 150–400)
RBC: 3.76 MIL/uL — ABNORMAL LOW (ref 3.87–5.11)
RDW: 14.3 % (ref 11.5–15.5)
WBC: 6.8 10*3/uL (ref 4.0–10.5)
nRBC: 0 % (ref 0.0–0.2)

## 2021-02-22 LAB — I-STAT BETA HCG BLOOD, ED (MC, WL, AP ONLY): I-stat hCG, quantitative: 5.4 m[IU]/mL — ABNORMAL HIGH (ref <5)

## 2021-02-22 LAB — SARS CORONAVIRUS 2 (TAT 6-24 HRS): SARS Coronavirus 2: NEGATIVE

## 2021-02-22 MED ORDER — GUAIFENESIN-CODEINE 100-10 MG/5ML PO SOLN: 5.0000 mL | Freq: Three times a day (TID) | ORAL | 0 refills | Status: DC | PRN

## 2021-02-22 NOTE — ED Triage Notes (Signed)
Pt reports pain to center of chest that started at dialysis today.  Reports non-productive cough and SOB when coughing x 1 week.

## 2021-02-22 NOTE — Discharge Instructions (Addendum)
Seen in the emergency department for cough and chest pain.  Your chest x-ray did not show any pneumonia.  Your Covid test is pending and you should check in my chart for results tomorrow.  We are prescribing you some cough medication.  Please follow-up with your regular doctor.

## 2021-02-22 NOTE — ED Provider Notes (Signed)
[facility] EMERGENCY DEPARTMENT Provider Note   CSN: [id number] Arrival date & time: 02/22/21  1731     History Chief Complaint  Patient presents with  . Chest Pain    Sabrina Mitchell is a 60 y.o. female.  She is Spanish-speaking and interpretation assistance provided by [employer].  She is complaining of 1 week of cough associated with chest pain.  She gets a burning pain with her coughing spells.  Productive of some white sputum.  When she has these spells she feels short of breath.  No fever.  No nausea or vomiting.  Had a Covid test that was still pending.  Had a single Covid vaccination but had allergic reaction to it so has not completed her Covid vaccinations.  She also said she had Covid a year ago.  Had [place] today, [place] Saturday.  Has been on Augmentin and cough medicine for 2 days.  No improvement.  The history is provided by the patient. The history is limited by a language barrier. A language interpreter was used.  Chest Pain Pain location:  Substernal area Pain quality: burning   Pain radiates to:  Does not radiate Pain severity:  Moderate Onset quality:  Gradual Duration:  1 week Timing:  Intermittent Progression:  Unchanged Chronicity:  New Relieved by:  Nothing Worsened by:  Coughing Ineffective treatments: cough medicine. Associated symptoms: cough and shortness of breath   Associated symptoms: no abdominal pain, no fever, no headache, no nausea and no vomiting   Risk factors: hypertension        Past Medical History:  Diagnosis Date  . Anemia   . Anxiety   . Complication of anesthesia   . Depression    denies  . End stage renal disease ([place])    hemodialysis 3x/wk @ [address]  . Family history of adverse reaction to anesthesia    sister vomiting after uterine cyst removal  . Gastritis   . GERD (gastroesophageal reflux disease)   . Hemorrhoids   . Hypertension   . Pneumonia     years ago  . PONV (postoperative nausea and vomiting)   . UTI (lower urinary tract infection) March 2014    Patient Active Problem List   Diagnosis Date Noted  . Psychophysiological insomnia 02/13/2021  . New onset of headaches 02/13/2021  . Depression with anxiety 10/30/2020  . Acute respiratory failure with hypoxia ([place]) 09/27/2019  . Pulmonary edema 09/26/2019  . Chest pain 09/26/2019  . Elevated troponin 09/26/2019  . Calcaneal spur of right foot 10/10/2018  . 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 GERD (gastroesophageal reflux disease) 01/17/2016  . 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 Post-menopausal bleeding 04/06/2012  . End stage renal disease on dialysis ([place]) 04/06/2012  . Hypertension 04/06/2012    Past Surgical History:  Procedure Laterality Date  . AV FISTULA PLACEMENT Left 07/05/09   Dr. Kellie Simmering  . COLONOSCOPY N/A 02/10/2016   SLF: 1. HEME postive stool due to colon polyps intrernal hemorrhoids 2. small internal hemrrohoids 3. moderate external hemorrhoids.   . ESOPHAGEAL DILATION N/A 02/10/2016   Procedure: ESOPHAGEAL DILATION;  Surgeon: Danie Binder, MD;  [facility];  Service: Endoscopy;  Laterality: N/A;  . ESOPHAGOGASTRODUODENOSCOPY N/A 02/10/2016   SLF: patent Schatzki's ring , mild gastritis/ duodentitis.   Marland Kitchen FISTULA SUPERFICIALIZATION Left 27/06/8241   Procedure: PLICATION BRACHIOCEPHALIC FISTULA;  Surgeon: Angelia Mould, MD;  Location: [facility];  Service: Vascular;  Laterality: Left;  . FISTULOGRAM N/A 10/22/2014   Procedure: FISTULOGRAM;  Surgeon: Angelia Mould, MD;  Location: [facility] CATH LAB;  Service: Cardiovascular;  Laterality: N/A;  . IR DIALY SHUNT INTRO NEEDLE/INTRACATH INITIAL W/IMG LEFT Left 02/01/2019  . IR DIALY SHUNT INTRO NEEDLE/INTRACATH INITIAL W/IMG LEFT Left 06/05/2020  . PERIPHERAL VASCULAR CATHETERIZATION Left 12/09/2016   Procedure: A/V Fistulagram;  Surgeon: Waynetta Sandy, MD;  Location: [facility] CV LAB;  Service: Cardiovascular;  Laterality: Left;  . REVISON OF ARTERIOVENOUS FISTULA Left 25/85/2778   Procedure: PLICATION OF BRACHIOCEPHALIC FISTULA;  Surgeon: Conrad Lochearn, MD;  Location: [facility];  Service: Vascular;  Laterality: Left;  . REVISON OF ARTERIOVENOUS FISTULA Left 08/09/2017   Procedure: REVISION OF ARTERIOVENOUS FISTULA LIGATION OF SIDE BRANCH;  Surgeon: Waynetta Sandy, MD;  Location: [facility];  Service: Vascular;  Laterality: Left;  . TUBAL LIGATION  2004     OB History    Gravida  5   Para      Term      Preterm      AB  3   Living  2     SAB  3   IAB      Ectopic      Multiple      Live Births  2           Family History  Problem Relation Age of Onset  . Hypertension Mother   . Hypertension Father   . Hypertension Sister   . Breast cancer Sister 25  . Diabetes Brother   . Hypertension Brother     Social History   Tobacco Use  . Smoking status: Never Smoker  . Smokeless tobacco: Never Used  Vaping Use  . Vaping Use: Never used  Substance Use Topics  . Alcohol use: No  . Drug use: No    Home Medications Prior to Admission medications   Medication Sig Start Date End Date Taking? Authorizing Provider  acetaminophen (TYLENOL) 325 MG tablet Take 650 mg by mouth every 6 (six) hours as needed for mild pain.     [provider]  amLODipine (NORVASC) 10 MG tablet Take by mouth. 09/27/19   [provider]  amLODipine (NORVASC) 5 MG tablet Take 5 mg by mouth in the morning and at bedtime.     [provider]  B Complex-C-Folic Acid (DIALYVITE 242) 0.8 MG TABS Take 1 tablet by mouth daily. 09/06/19   [provider]  calcium carbonate (TUMS - DOSED IN MG ELEMENTAL CALCIUM) 500 MG chewable tablet Chew 2 tablets by mouth 3 (three) times daily with meals.    [provider]  carvedilol (COREG) 6.25 MG tablet Take 1 tablet (6.25 mg total) by mouth at  bedtime. Patient taking differently: Take 6.25 mg by mouth 2 (two) times daily with a meal. 10/10/18   Ladell Pier, MD  carvedilol (COREG) 6.25 MG tablet Take by mouth. 09/05/19   [provider]  ethyl chloride spray APPLY 1 APPLICATION TOPICALLY AS NEEDED (FOR DIALYSIS). 02/17/19   Ladell Pier, MD  Lanthanum Carbonate (FOSRENOL) 750 MG PACK Take 750 mg by mouth 3 (three) times daily with meals. Patient not taking: Reported on 02/12/2021 05/06/19   [provider]  Methoxy PEG-Epoetin Beta (MIRCERA IJ) Mircera 04/13/20 04/12/21  [provider]  Methoxy PEG-Epoetin Beta (MIRCERA IJ) Mircera 02/24/20 02/22/21  [provider]  mirtazapine (REMERON) 15 MG tablet Take 0.5 tablets (7.5 mg  total) by mouth at bedtime. 11/08/20   Ladell Pier, MD  omeprazole (PRILOSEC) 20 MG capsule Take 1 capsule (20 mg total) by mouth daily. 1 PO 30 mins prior to breakfast and supper Patient taking differently: Take 20 mg by mouth daily as needed (acid reflux). 08/06/17   Ladell Pier, MD  omeprazole (PRILOSEC) 20 MG capsule Take by mouth. 12/13/18   [provider]  oxyCODONE (OXY IR/ROXICODONE) 5 MG immediate release tablet Take by mouth. 09/27/19   [provider]  sevelamer carbonate (RENVELA) 800 MG tablet Take by mouth. 03/23/18   [provider]  sucroferric oxyhydroxide (VELPHORO) 500 MG chewable tablet Chew by mouth. 11/07/19   [provider]  zolpidem (AMBIEN) 5 MG tablet Take 1 tablet (5 mg total) by mouth at bedtime as needed for sleep. 02/12/21   Mayers, Cari S, PA-C    Allergies    Covid-19 (mrna) vaccine, Zoloft [sertraline], Amitriptyline, and Trazodone and nefazodone  Review of Systems   Review of Systems  Constitutional: Negative for fever.  HENT: Negative for sore throat.   Eyes: Negative for visual disturbance.  Respiratory: Positive for cough and shortness of breath.   Cardiovascular: Positive for chest  pain.  Gastrointestinal: Negative for abdominal pain, nausea and vomiting.  Genitourinary: Negative for dysuria.  Musculoskeletal: Negative for neck pain.  Skin: Negative for rash.  Neurological: Negative for headaches.    Physical Exam Updated Vital Signs BP 133/88 (BP Location: Right Arm)   Pulse 97   Temp 98.6 F (37 C) (Oral)   Resp 16   LMP 02/11/2012 Comment: tubal ligation  SpO2 99%   Physical Exam Vitals and nursing note reviewed.  Constitutional:      General: She is not in acute distress.    Appearance: She is well-developed and well-nourished.  HENT:     Head: Normocephalic and atraumatic.  Eyes:     Conjunctiva/sclera: Conjunctivae normal.  Cardiovascular:     Rate and Rhythm: Normal rate and regular rhythm.     Heart sounds: Normal heart sounds. No murmur heard.   Pulmonary:     Effort: Pulmonary effort is normal. No respiratory distress.     Breath sounds: Normal breath sounds.  Abdominal:     Palpations: Abdomen is soft.     Tenderness: There is no abdominal tenderness.  Musculoskeletal:        General: No edema. Normal range of motion.     Cervical back: Neck supple.     Right lower leg: No tenderness.     Left lower leg: No tenderness.  Skin:    General: Skin is warm and dry.     Capillary Refill: Capillary refill takes less than 2 seconds.  Neurological:     General: No focal deficit present.     Mental Status: She is alert.  Psychiatric:        Mood and Affect: Mood and affect normal.     ED Results / Procedures / Treatments   Labs (all labs ordered are listed, but only abnormal results are displayed) Labs Reviewed  BASIC METABOLIC PANEL - Abnormal; Notable for the following components:      Result Value   Potassium 2.8 (*)    Chloride 94 (*)    Creatinine, Ser 4.04 (*)    GFR, Estimated 12 (*)    All other components within normal limits  CBC - Abnormal; Notable for the following components:   RBC 3.76 (*)    Hemoglobin 11.8 (*)  All other components within normal limits  I-STAT BETA HCG BLOOD, ED ([facility], [facility], [facility] ONLY) - Abnormal; Notable for the following components:   I-stat hCG, quantitative 5.4 (*)    All other components within normal limits  SARS CORONAVIRUS 2 (TAT 6-24 HRS)  TROPONIN I (HIGH SENSITIVITY)  TROPONIN I (HIGH SENSITIVITY)    EKG EKG Interpretation  Date/Time:  Saturday February 22 2021 17:47:02 EST Ventricular Rate:  87 PR Interval:  116 QRS Duration: 94 QT Interval:  384 QTC Calculation: 462 R Axis:   44 Text Interpretation: Normal sinus rhythm ST & T wave abnormality, consider inferior ischemia Abnormal ECG Confirmed by Aletta Edouard [id number]) on 02/22/2021 5:56:14 PM   Radiology DG Chest Port 1 View  Result Date: 02/22/2021 CLINICAL DATA:  Chest pain. EXAM: PORTABLE CHEST 1 VIEW COMPARISON:  October 11, 2019. FINDINGS: Stable cardiomediastinal silhouette. No pneumothorax or pleural effusion is noted. Both lungs are clear. The visualized skeletal structures are unremarkable. IMPRESSION: No active disease. Electronically Signed   By: Marijo Conception M.D.   On: 02/22/2021 18:26    Procedures Procedures   Medications Ordered in ED Medications - No data to display  ED Course  I have reviewed the triage vital signs and the nursing notes.  Pertinent labs & imaging results that were available during my care of the patient were reviewed by me and considered in my medical decision making (see chart for details).    MDM Rules/Calculators/A&P                         Sabrina Mitchell was evaluated in Emergency Department on 02/22/2021 for the symptoms described in the history of present illness. She was evaluated in the context of the global COVID-19 pandemic, which necessitated consideration that the patient might be at risk for infection with the SARS-CoV-2 virus that causes COVID-19. Institutional protocols and algorithms that pertain to the evaluation of patients at risk for  COVID-19 are in a state of rapid change based on information released by regulatory bodies including the CDC and federal and state organizations. These policies and algorithms were followed during the patient's care in the ED. This patient complains of cough and chest pain; this involves an extensive number of treatment Options and is a complaint that carries with it a high risk of complications and Morbidity. The differential includes COVID, pneumonia, bronchitis, ACS, PE, pneumothorax  I ordered, reviewed and interpreted labs, which included CBC with normal white count, stable hemoglobin, chemistries with low potassium elevated creatinine consistent with her end-stage renal disease troponins flat, Covid testing negative I ordered imaging studies which included chest x-ray and I independently    visualized and interpreted imaging which showed no acute infiltrates  Previous records obtained and reviewed in epic, no recent admissions  After the interventions stated above, I reevaluated the patient and found patient to be hemodynamically stable.  Reviewed work-up with her via interpreter.  She is comfortable with plan for continuing her antibiotics and I will provide her prescription for some cough medicine.  Return instructions discussed.   Final Clinical Impression(s) / ED Diagnoses Final diagnoses:  Acute bronchitis, unspecified organism  Nonspecific chest pain  ESRD (end stage renal disease) ([facility])    Rx / DC Orders ED Discharge Orders         Ordered    guaiFENesin-codeine 100-10 MG/5ML syrup  3 times daily PRN        02/22/21 2303  Hayden Rasmussen, MD 02/23/21 1001

## 2021-02-23 NOTE — ED Notes (Signed)
Patient verbalizes understanding of discharge instructions. Prescriptions reviewed. Opportunity for questioning and answers were provided. Armband removed by staff, pt discharged from ED ambulatory.

## 2021-02-24 ENCOUNTER — Telehealth: Payer: Self-pay

## 2021-02-24 NOTE — Telephone Encounter (Signed)
Contacted patient by phone with Interpreter ID# [phone] to remind the patient of their upcoming appt with [facility] and the location as well. Contacted the patient twice with the interpreter.

## 2021-02-26 ENCOUNTER — Ambulatory Visit: Payer: Self-pay

## 2021-03-12 ENCOUNTER — Ambulatory Visit: Payer: Self-pay | Admitting: Physician Assistant

## 2021-03-12 ENCOUNTER — Other Ambulatory Visit: Payer: Self-pay

## 2021-03-12 VITALS — BP 119/70 | HR 65 | Temp 98.7°F | Resp 18 | Ht 60.0 in

## 2021-03-12 DIAGNOSIS — F5104 Psychophysiologic insomnia: Secondary | ICD-10-CM

## 2021-03-12 DIAGNOSIS — N186 End stage renal disease: Secondary | ICD-10-CM

## 2021-03-12 DIAGNOSIS — K219 Gastro-esophageal reflux disease without esophagitis: Secondary | ICD-10-CM

## 2021-03-12 DIAGNOSIS — Z992 Dependence on renal dialysis: Secondary | ICD-10-CM

## 2021-03-12 DIAGNOSIS — Z13228 Encounter for screening for other metabolic disorders: Secondary | ICD-10-CM

## 2021-03-12 DIAGNOSIS — F418 Other specified anxiety disorders: Secondary | ICD-10-CM

## 2021-03-12 DIAGNOSIS — R1084 Generalized abdominal pain: Secondary | ICD-10-CM

## 2021-03-12 MED ORDER — ZOLPIDEM TARTRATE 10 MG PO TABS: 10.0000 mg | ORAL_TABLET | Freq: Every evening | ORAL | 0 refills | Status: DC | PRN

## 2021-03-12 NOTE — Progress Notes (Signed)
Established Patient Office Visit  Subjective:  Patient ID: Sabrina Mitchell, female    DOB: May 25, 1961  Age: 60 y.o. MRN: [id number]  CC: No chief complaint on file.   HPI Sabrina Mitchell reports that she has been taking the Ambien.  Reports that 5 mg did offer a small amount of relief, states that she increase the dose on her own to 7-1/2 mg.  Reports that she is getting approximately 3 to 4 hours asleep.  Reports that she has been having abdominal discomfort for the past few weeks.  Reports that she previously took Prilosec with relief but stopped it a couple of months ago.  Denies nausea, vomiting, diarrhea, heartburn or acid reflux.   Due to language barrier, an interpreter was present during the history-taking and subsequent discussion (and for part of the physical exam) with this patient.   Past Medical History:  Diagnosis Date  . Anemia   . Anxiety   . Complication of anesthesia   . Depression    denies  . End stage renal disease ([place])    hemodialysis 3x/wk @ [address]  . Family history of adverse reaction to anesthesia    sister vomiting after uterine cyst removal  . Gastritis   . GERD (gastroesophageal reflux disease)   . Hemorrhoids   . Hypertension   . Pneumonia    years ago  . PONV (postoperative nausea and vomiting)   . UTI (lower urinary tract infection) March 2014    Past Surgical History:  Procedure Laterality Date  . AV FISTULA PLACEMENT Left 07/05/09   Dr. Kellie Simmering  . COLONOSCOPY N/A 02/10/2016   SLF: 1. HEME postive stool due to colon polyps intrernal hemorrhoids 2. small internal hemrrohoids 3. moderate external hemorrhoids.   . ESOPHAGEAL DILATION N/A 02/10/2016   Procedure: ESOPHAGEAL DILATION;  Surgeon: Danie Binder, MD;  [facility];  Service: Endoscopy;  Laterality: N/A;  . ESOPHAGOGASTRODUODENOSCOPY N/A 02/10/2016   SLF: patent Schatzki's ring , mild gastritis/ duodentitis.   Marland Kitchen FISTULA SUPERFICIALIZATION Left  40/08/8118   Procedure: PLICATION BRACHIOCEPHALIC FISTULA;  Surgeon: Angelia Mould, MD;  Location: [place];  Service: Vascular;  Laterality: Left;  . FISTULOGRAM N/A 10/22/2014   Procedure: FISTULOGRAM;  Surgeon: Angelia Mould, MD;  Location: [facility] CATH LAB;  Service: Cardiovascular;  Laterality: N/A;  . IR DIALY SHUNT INTRO NEEDLE/INTRACATH INITIAL W/IMG LEFT Left 02/01/2019  . IR DIALY SHUNT INTRO NEEDLE/INTRACATH INITIAL W/IMG LEFT Left 06/05/2020  . PERIPHERAL VASCULAR CATHETERIZATION Left 12/09/2016   Procedure: A/V Fistulagram;  Surgeon: Waynetta Sandy, MD;  Location: [place] CV LAB;  Service: Cardiovascular;  Laterality: Left;  . REVISON OF ARTERIOVENOUS FISTULA Left 14/78/2956   Procedure: PLICATION OF BRACHIOCEPHALIC FISTULA;  Surgeon: Conrad [place], MD;  Location: [place];  Service: Vascular;  Laterality: Left;  . REVISON OF ARTERIOVENOUS FISTULA Left 08/09/2017   Procedure: REVISION OF ARTERIOVENOUS FISTULA LIGATION OF SIDE BRANCH;  Surgeon: Waynetta Sandy, MD;  Location: [place];  Service: Vascular;  Laterality: Left;  . TUBAL LIGATION  2004    Family History  Problem Relation Age of Onset  . Hypertension Mother   . Hypertension Father   . Hypertension Sister   . Breast cancer Sister 34  . Diabetes Brother   . Hypertension Brother     Social History   Socioeconomic History  . Marital status: Significant Other    Spouse name: Not on file  . Number of children: 2  .  Years of education: Not on file  . Highest education level: Not on file  Occupational History  . Not on file  Tobacco Use  . Smoking status: Never Smoker  . Smokeless tobacco: Never Used  Vaping Use  . Vaping Use: Never used  Substance and Sexual Activity  . Alcohol use: No  . Drug use: No  . Sexual activity: Not Currently    Birth control/protection: Surgical  Other Topics Concern  . Not on file  Social History Narrative  . Not on file   Social Determinants of Health    Financial Resource Strain: Not on file  Food Insecurity: Not on file  Transportation Needs: Not on file  Physical Activity: Not on file  Stress: Not on file  Social Connections: Not on file  Intimate Partner Violence: Not on file    Outpatient Medications Prior to Visit  Medication Sig Dispense Refill  . acetaminophen (TYLENOL) 325 MG tablet Take 650 mg by mouth every 6 (six) hours as needed for mild pain.     Marland Kitchen amLODipine (NORVASC) 10 MG tablet Take by mouth.    . B Complex-C-Folic Acid (DIALYVITE 664) 0.8 MG TABS Take 1 tablet by mouth daily.    . calcium carbonate (TUMS - DOSED IN MG ELEMENTAL CALCIUM) 500 MG chewable tablet Chew 2 tablets by mouth 3 (three) times daily with meals.    . carvedilol (COREG) 6.25 MG tablet Take 1 tablet (6.25 mg total) by mouth at bedtime. (Patient taking differently: Take 6.25 mg by mouth 2 (two) times daily with a meal.) 180 tablet 3  . ethyl chloride spray APPLY 1 APPLICATION TOPICALLY AS NEEDED (FOR DIALYSIS). 103.5 mL 0  . guaiFENesin-codeine 100-10 MG/5ML syrup Take 5 mLs by mouth 3 (three) times daily as needed for cough. 120 mL 0  . mirtazapine (REMERON) 15 MG tablet Take 0.5 tablets (7.5 mg total) by mouth at bedtime. 30 tablet 1  . omeprazole (PRILOSEC) 20 MG capsule Take 1 capsule (20 mg total) by mouth daily. 1 PO 30 mins prior to breakfast and supper (Patient taking differently: Take 20 mg by mouth daily as needed (acid reflux).) 30 capsule 3  . omeprazole (PRILOSEC) 20 MG capsule Take by mouth.    . sevelamer carbonate (RENVELA) 800 MG tablet Take by mouth.    . sucroferric oxyhydroxide (VELPHORO) 500 MG chewable tablet Chew by mouth.    . zolpidem (AMBIEN) 5 MG tablet Take 1 tablet (5 mg total) by mouth at bedtime as needed for sleep. 15 tablet 0  . amLODipine (NORVASC) 5 MG tablet Take 5 mg by mouth in the morning and at bedtime.     . carvedilol (COREG) 6.25 MG tablet Take by mouth.    . Lanthanum Carbonate (FOSRENOL) 750 MG PACK Take  750 mg by mouth 3 (three) times daily with meals. (Patient not taking: No sig reported)    . Methoxy PEG-Epoetin Beta (MIRCERA IJ) Mircera (Patient not taking: Reported on 03/12/2021)    . oxyCODONE (OXY IR/ROXICODONE) 5 MG immediate release tablet Take by mouth. (Patient not taking: Reported on 03/12/2021)     No facility-administered medications prior to visit.    Allergies  Allergen Reactions  . Covid-19 (Mrna) Vaccine Swelling    Tongue swelling  . Zoloft [Sertraline]     CP and chills  . Amitriptyline Other (See Comments)    Makes her shake  . Trazodone And Nefazodone Other (See Comments)    Makes her shake    ROS Review  of Systems  Constitutional: Positive for fatigue.  HENT: Negative.   Eyes: Negative.   Respiratory: Negative.   Cardiovascular: Negative.   Gastrointestinal: Positive for abdominal pain. Negative for diarrhea, nausea and vomiting.  Endocrine: Negative.   Genitourinary: Negative.   Musculoskeletal: Negative.   Skin: Negative.   Allergic/Immunologic: Negative.   Hematological: Negative.   Psychiatric/Behavioral: Positive for sleep disturbance.      Objective:    Physical Exam Vitals and nursing note reviewed.  Constitutional:      Appearance: Normal appearance.  HENT:     Head: Normocephalic and atraumatic.     Right Ear: External ear normal.     Left Ear: External ear normal.     Nose: Nose normal.     Mouth/Throat:     Mouth: Mucous membranes are moist.     Pharynx: Oropharynx is clear.  Eyes:     Extraocular Movements: Extraocular movements intact.     Conjunctiva/sclera: Conjunctivae normal.     Pupils: Pupils are equal, round, and reactive to light.  Cardiovascular:     Rate and Rhythm: Normal rate and regular rhythm.     Pulses: Normal pulses.     Heart sounds: Normal heart sounds.  Pulmonary:     Effort: Pulmonary effort is normal.     Breath sounds: Normal breath sounds.  Abdominal:     Tenderness: There is generalized abdominal  tenderness. Negative signs include Murphy's sign.  Musculoskeletal:        General: Normal range of motion.     Cervical back: Normal range of motion and neck supple.  Skin:    General: Skin is warm and dry.  Neurological:     General: No focal deficit present.     Mental Status: She is alert and oriented to person, place, and time.     BP 119/70 (BP Location: Right Arm, Patient Position: Sitting, Cuff Size: Normal)   Pulse 65   Temp 98.7 F (37.1 C) (Oral)   Resp 18   Ht 5' (1.524 m)   LMP 02/11/2012 Comment: tubal ligation  SpO2 100%   BMI 24.14 kg/m  Wt Readings from Last 3 Encounters:  11/08/20 123 lb 9.6 oz (56.1 kg)  10/30/20 129 lb (58.5 kg)  06/05/20 125 lb (56.7 kg)     Health Maintenance Due  Topic Date Due  . COVID-19 Vaccine (2 - Pfizer 3-dose series) 04/02/2020  . MAMMOGRAM  06/28/2020  . PAP SMEAR-Modifier  12/10/2020    There are no preventive care reminders to display for this patient.  Lab Results  Component Value Date   TSH 0.764 03/27/2020   Lab Results  Component Value Date   WBC 6.8 02/22/2021   HGB 11.8 (L) 02/22/2021   HCT 36.1 02/22/2021   MCV 96.0 02/22/2021   PLT 178 02/22/2021   Lab Results  Component Value Date   NA 136 02/22/2021   K 2.8 (L) 02/22/2021   CO2 28 02/22/2021   GLUCOSE 97 02/22/2021   BUN 17 02/22/2021   CREATININE 4.04 (H) 02/22/2021   BILITOT 0.6 09/26/2019   ALKPHOS 88 09/26/2019   AST 39 09/26/2019   ALT 33 09/26/2019   PROT 6.9 09/26/2019   ALBUMIN 3.4 (L) 09/27/2019   CALCIUM 9.8 02/22/2021   ANIONGAP 14 02/22/2021   Lab Results  Component Value Date   CHOL 140 03/11/2018   Lab Results  Component Value Date   HDL 60 03/11/2018   Lab Results  Component Value Date  LDLCALC 47 03/11/2018   Lab Results  Component Value Date   TRIG 163 (H) 03/11/2018   Lab Results  Component Value Date   CHOLHDL 2.3 03/11/2018   Lab Results  Component Value Date   HGBA1C 4.8 03/22/2017       Assessment & Plan:   Problem List Items Addressed This Visit   None    1. Psychophysiological insomnia Increase Ambien 10 mg.  Patient given strict instructions to not increase dose.  Patient has follow-up appointment with Dr. Wynetta Emery on May 05, 2021.  Patient encouraged to return to mobile unit in 2 weeks for follow-up.  Patient requested fasting labs to be completed today, states that she is overdue.  Once again patient is adamant about referral to neurology for insomnia stating that her friend's was seen by neurology for insomnia and was giving a small pill that offered her relief.  Patient education given regarding medical necessity of referral. - zolpidem (AMBIEN) 10 MG tablet; Take 1 tablet (10 mg total) by mouth at bedtime as needed for sleep.  Dispense: 15 tablet; Refill: 0  2. Depression with anxiety   3. Gastroesophageal reflux disease without esophagitis  - H Pylori, IGM, IGG, IGA AB  4. Generalized abdominal pain   5. Screening for metabolic disorder  - Lipid panel  6. End stage renal disease on dialysis ([place])  - CBC with Differential/Platelet - TSH - Vitamin D, 25-hydroxy  No orders of the defined types were placed in this encounter.   I have reviewed the patient's medical history (PMH, PSH, Social History, Family History, Medications, and allergies) , and have been updated if relevant. I spent 30 minutes reviewing chart and  face to face time with patient.     Follow-up: No follow-ups on file.    Loraine Grip Mayers, PA-C

## 2021-03-12 NOTE — Progress Notes (Signed)
Patient complains of low WBC/RBC during her visit with dialysis.  Patient shares she had no temperature, she had no bronchitis.  Patient denies pain at this time, Patient is going to start receiving.

## 2021-03-12 NOTE — Patient Instructions (Signed)
I have increased your Ambien to 10 mg, do not exceed this dosage.   I encourage you to resume your omeprazole on a daily basis  We will call you with your lab results, I encourage you to return to the mobile unit in 2 weeks for further evaluation of your insomnia.   Kennieth Rad, PA-C Physician Assistant [address] [email or web address]    Conn's Current Therapy 2021 (pp. 213-216). [place], [place]: Elsevier.">  Enfermedad de reflujo gastroesofgico en los adultos Gastroesophageal Reflux Disease, Adult El reflujo gastroesofgico (RGE) ocurre cuando el cido del estmago sube por el tubo que conecta la boca con el estmago (esfago). Normalmente, la comida baja por el esfago y se mantiene en el estmago, donde se la digiere. Sin embargo, cuando una persona tiene [place], los alimentos y el cido estomacal suelen volver al esfago. Si esto se vuelve un problema ms grave, a la persona se le puede diagnosticar una enfermedad llamada enfermedad de reflujo gastroesofgico (ERGE). La ERGE ocurre cuando el reflujo:  Sucede a menudo.  Causa sntomas frecuentes o graves.  Causa problemas tales como dao en el esfago. Cuando el cido del Insurance claims handler en contacto con el esfago, el cido puede provocar inflamacin en el esfago. Con el tiempo, pueden formarse pequeos agujeros (lceras) en el revestimiento del esfago. Cules son las causas? Esta afeccin se debe a un problema en el msculo que se encuentra entre el esfago y Product manager (esfnter esofgico inferior, o EEI). Normalmente, el EEI se cierra una vez que la comida pasa a travs del esfago hasta el [place]. Cuando el EEI se encuentra debilitado o tiene alguna anomala, no se cierra por completo, y eso permite que tanto la comida como el jugo gstrico, que es cido, [place] a subir por el esfago. El EEI puede debilitarse a causa de ciertas sustancias alimenticias, medicamentos y  Product/process development scientist, que incluyen:  El consumo de [place].  [place].  Tener una hernia de hiato.  Consumo de alcohol.  Ciertos alimentos y bebidas, como caf, chocolate, cebollas y [place]. Qu incrementa el riesgo? Es ms probable que tenga esta afeccin si:  Tiene un aumento del Engineer, site.  Tiene un trastorno del tejido conjuntivo.  Toma antiinflamatorios no esteroideos (AINE), como el ibuprofeno. Cules son los signos o sntomas? Los sntomas de esta afeccin incluyen:  Merchant navy officer.  Dificultad o dolor para tragar o la sensacin de tener un bulto en la garganta.  Sabor amargo en la boca.  Mal aliento y [place] gran cantidad de saliva.  Estmago inflamado o con Tree surgeon y eructos.  Dolor en el pecho. El dolor de pecho puede deberse a distintas afecciones. Es importante que consulte al mdico si tiene dolor de [place].  Dificultad para respirar o sibilancias.  Tos constante (crnica) o tos nocturna.  Desgaste del Paramedic.  Prdida de peso. Cmo se diagnostica? Esta afeccin se puede diagnosticar en funcin de los antecedentes mdicos y un examen fsico. Para determinar si tiene ERGE leve o grave, el mdico tambin puede controlar cmo usted reacciona al tratamiento. Tambin pueden [employer], que [place] los siguientes:  Un estudio para examinarle el [place] y el esfago con una cmara pequea (endoscopa).  Una prueba para medir el grado de Musician.  Una prueba para medir cunta presin hay en el esfago.  Un estudio de deglucin con bario comn o modificado para ver la forma, el tamao y el funcionamiento del esfago. Cmo se trata? El tratamiento de esta afeccin puede variar  segn la gravedad de los sntomas. El mdico puede recomendarle lo siguiente:  Cambios en la dieta.  Medicamentos.  [place]. [place] del tratamiento es ayudar a Public house manager los sntomas y Product/process development scientist las complicaciones. Siga estas instrucciones en su  casa: Comida y bebida  Siga la dieta recomendada por el mdico. Esto puede incluir evitar ciertos alimentos y bebidas, por ejemplo: ? Caf y t negro, con o sin cafena. ? Bebidas que contengan alcohol. ? Bebidas energticas y deportivas. ? Bebidas gaseosas o refrescos. ? Chocolate y cacao. ? Menta y [place]. ? Ajo y cebolla. ? [place] picante. ? Alimentos condimentados, picantes y cidos, por ejemplo, todos los tipos de pimientas, [place] en polvo, curry en polvo, vinagre, salsas picantes y [employer]. ? Ctricos y sus jugos, por ejemplo, naranjas, limones y limas. ? Alimentos a base de tomate, como salsa de [place], [place], salsa picante y pizza con salsa de [place]. ? Alimentos fritos y [place], [place] donas, papas fritas y aderezos ricos en grasas. ? Carnes con alto contenido de grasa, como salchichas, y cortes de carnes rojas y blancas con mucha grasa, por ejemplo, chuletas o costillas, embutidos, jamn y tocino. ? Productos lcteos ricos en grasas, como leche [place], [place] y [place] crema.  Haga comidas pequeas y frecuentes Medical sales representative de comidas abundantes.  Evite beber grandes cantidades de lquidos con las comidas.  Evite comer 2 o 3horas antes de acostarse.  Evite recostarse inmediatamente despus de comer.  No haga ejercicios enseguida despus de comer.   Estilo de vida  No consuma ningn producto que contenga nicotina o tabaco. Estos productos incluyen cigarrillos, tabaco para Higher education careers adviser y aparatos de vapeo, como los Psychologist, sport and exercise. Si necesita ayuda para dejar de fumar, consulte al [employer].  Trate de reducir el estrs con mtodos como el yoga o la meditacin. Si necesita ayuda para reducir [employer] de estrs, consulte al mdico.  Si tiene sobrepeso, baje hasta llegar a un peso saludable para usted. Pdale consejos al mdico para bajar de peso de [place] segura.   Instrucciones generales  Est atento a cualquier cambio en los sntomas.  Use los  medicamentos de venta libre y los recetados solamente como se lo haya indicado el mdico. No tome aspirina, ibuprofeno ni otros antiinflamatorios no esteroideos (AINE) a menos que el mdico le haya indicado que tome estos medicamentos.  Use ropa suelta. No use nada apretado alrededor de la cintura que haga presin sobre el abdomen.  Levante (eleve) la cabecera de la cama aproximadamente 6pulgadas (15cm). Para hacerlo puede usar una cua.  Evite inclinarse si al hacerlo empeoran los sntomas.  Cumpla con todas las visitas de seguimiento. Esto es importante. Comunquese con un mdico si:  Tiene los siguientes sntomas: ? Sntomas nuevos. ? Prdida de peso sin causa aparente. ? Dificultad o dolor al tragar. ? Sibilancias o una tos persistente. ? Voz ronca.  Los sntomas no mejoran con Dispensing optician. Solicite ayuda de inmediato si:  Tree surgeon repentino [employer], el cuello, la [place], los dientes o la espalda.  De repente se siente transpirado, mareado o aturdido.  Siente falta de aire o Tourist information centre manager.  Vomita y el vmito es de color verde, amarillo o negro, o tiene un aspecto similar a la sangre o a los posos de caf.  Se desmaya.  Tiene heces rojas, sanguinolentas o negras.  No puede tragar, beber o comer. Estos sntomas pueden representar un problema grave que constituye Engineer, maintenance (IT). No espere  a ver si los sntomas desaparecen. Solicite atencin mdica de inmediato. Comunquese con el servicio de emergencias de su localidad (911 en los [place]). No conduzca por sus propios medios [employer] Financial. Resumen  El reflujo gastroesofgico ocurre cuando el cido del estmago sube al esfago. La ERGE es una enfermedad en la que el reflujo ocurre con frecuencia, causa sntomas frecuentes o graves, o causa problemas tales como dao en el esfago.  El tratamiento de esta afeccin puede variar segn la gravedad de los sntomas. El mdico puede indicarle que siga  una dieta y haga cambios en su estilo de vida, tome medicamentos o se someta a [place].  Comunquese con un mdico si tiene sntomas nuevos o los sntomas empeoran.  Use los medicamentos de venta libre y los recetados solamente como se lo haya indicado el mdico. No tome aspirina, ibuprofeno ni otros antiinflamatorios no esteroideos (AINE) a menos que el mdico se lo indique.  Concurra a todas las visitas de seguimiento como se lo haya indicado el mdico. Esto es importante. Esta informacin no tiene Marine scientist el consejo del mdico. Asegrese de hacerle al mdico cualquier pregunta que tenga. Document Revised: 07/31/2020 Document Reviewed: 07/31/2020 Elsevier Patient Education  2021 [employer].

## 2021-03-14 LAB — H PYLORI, IGM, IGG, IGA AB
H pylori, IgM Abs: <9 units (ref 0.0–8.9)
H. pylori, IgA Abs: <9 units (ref 0.0–8.9)
H. pylori, IgG AbS: 0.25 Index Value (ref 0.00–0.79)

## 2021-03-14 LAB — CBC WITH DIFFERENTIAL/PLATELET
Basophils Absolute: 0 10*3/uL (ref 0.0–0.2)
Basos: 1 %
EOS (ABSOLUTE): 0.1 10*3/uL (ref 0.0–0.4)
Eos: 2 %
Hematocrit: 30.6 % — ABNORMAL LOW (ref 34.0–46.6)
Hemoglobin: 10.1 g/dL — ABNORMAL LOW (ref 11.1–15.9)
Immature Grans (Abs): 0 10*3/uL (ref 0.0–0.1)
Immature Granulocytes: 0 %
Lymphocytes Absolute: 1.1 10*3/uL (ref 0.7–3.1)
Lymphs: 22 %
MCH: 31.8 pg (ref 26.6–33.0)
MCHC: 33 g/dL (ref 31.5–35.7)
MCV: 96 fL (ref 79–97)
Monocytes Absolute: 0.4 10*3/uL (ref 0.1–0.9)
Monocytes: 9 %
Neutrophils Absolute: 3.4 10*3/uL (ref 1.4–7.0)
Neutrophils: 66 %
Platelets: 232 10*3/uL (ref 150–450)
RBC: 3.18 x10E6/uL — ABNORMAL LOW (ref 3.77–5.28)
RDW: 14.3 % (ref 11.7–15.4)
WBC: 5.1 10*3/uL (ref 3.4–10.8)

## 2021-03-14 LAB — VITAMIN D 25 HYDROXY (VIT D DEFICIENCY, FRACTURES): Vit D, 25-Hydroxy: 14.9 ng/mL — ABNORMAL LOW (ref 30.0–100.0)

## 2021-03-14 LAB — LIPID PANEL
Chol/HDL Ratio: 3 ratio (ref 0.0–4.4)
Cholesterol, Total: 195 mg/dL (ref 100–199)
HDL: 65 mg/dL (ref >39)
LDL Chol Calc (NIH): 111 mg/dL — ABNORMAL HIGH (ref 0–99)
Triglycerides: 109 mg/dL (ref 0–149)
VLDL Cholesterol Cal: 19 mg/dL (ref 5–40)

## 2021-03-14 LAB — TSH: TSH: 1.13 u[IU]/mL (ref 0.450–4.500)

## 2021-03-18 ENCOUNTER — Telehealth: Payer: Self-pay | Admitting: *Deleted

## 2021-03-18 NOTE — Telephone Encounter (Signed)
-----   Message from Kennieth Rad, [place] sent at 03/17/2021  9:37 AM EDT ----- Please call patient and let her know that her cholesterol overall is within normal limits, however her LDL levels are slightly elevated.  Her risk of a cardiovascular event in the next 10 years is 4.6%, she does not need to start medication at this time, but I do encourage her to follow a low-cholesterol diet.  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  Her thyroid level is within normal limits.  Her vitamin D levels are low, due to kidney deficiency, she should not take more than the 1000 units once a day, and this level should be rechecked in 8 weeks.   The 10-year ASCVD risk score Mikey Bussing DC Brooke Bonito., et al., 2013) is: 4.6%   Values used to calculate the score:     Age: 60 years     Sex: Female     Is Non-Hispanic African American: No     Diabetic: Yes     Tobacco smoker: No     Systolic Blood Pressure: 121 mmHg     Is BP treated: Yes     HDL Cholesterol: 65 mg/dL     Total Cholesterol: 195 mg/dL

## 2021-03-18 NOTE — Telephone Encounter (Signed)
Medical Assistant used [place] to contact patient.  Interpreter Name: Maretta Bees Interpreter #: [phone] Patient is aware of labs showing overall normal cholesterol except for elevated LDL and needing to adhere to low cholesterol diet since risk factor is only 4.6%, no medication will be started. Patient is aware hemoglobin being normal and states she receives iron while at the dialysis center. Patient is aware of TSH being normal but needing to take no more than 1000 units of vitamin d daily to address low levels but not to overwork her kidneys. Patient has no further questions at this time.

## 2021-03-31 ENCOUNTER — Other Ambulatory Visit: Payer: Self-pay

## 2021-03-31 MED FILL — Amlodipine Besylate Tab 10 MG (Base Equivalent): ORAL | 30 days supply | Qty: 30 | Fill #0 | Status: AC

## 2021-04-02 ENCOUNTER — Other Ambulatory Visit: Payer: Self-pay

## 2021-04-02 ENCOUNTER — Ambulatory Visit: Payer: Self-pay | Admitting: Physician Assistant

## 2021-04-02 VITALS — Resp 18 | Ht 62.0 in | Wt 128.0 lb

## 2021-04-02 DIAGNOSIS — N186 End stage renal disease: Secondary | ICD-10-CM

## 2021-04-02 DIAGNOSIS — F5104 Psychophysiologic insomnia: Secondary | ICD-10-CM

## 2021-04-02 DIAGNOSIS — Z992 Dependence on renal dialysis: Secondary | ICD-10-CM

## 2021-04-02 DIAGNOSIS — N644 Mastodynia: Secondary | ICD-10-CM

## 2021-04-02 MED ORDER — ZOLPIDEM TARTRATE 10 MG PO TABS: 10.0000 mg | ORAL_TABLET | Freq: Every evening | ORAL | 0 refills | Status: DC | PRN

## 2021-04-02 NOTE — Progress Notes (Signed)
Patient has  Patient has Patient reports burning in the outer parts of her breast beginning last night. Patient denies any bumps, redness, fluid or blood from the nipple.

## 2021-04-02 NOTE — Patient Instructions (Signed)
I sent a refill of your Ambien to your pharmacy.  Please return to the mobile unit if you require a refill prior to your office visit with Dr. Wynetta Emery.  Please let us know there is any else we can do for you  Kennieth Rad, PA-C Physician Assistant [place] [email or web address]    Mancos es un radiografa de baja energa de las mamas que se realiza para determinar si hay cambios anormales. Este procedimiento permite explorar y Actuary cambio que pudiera indicar la presencia de cncer de mama. Las [employer] se Research scientist (medical) en las mujeres. Un hombre puede hacerse una mamografa si tiene un bulto o hinchazn en la mama. La mamografa tambin puede identificar otros cambios y variaciones en las [place], por ejemplo:  La inflamacin del tejido mamario (mastitis).  Una zona infectada que contiene una acumulacin de pus (absceso).  Una cavidad llena de lquido (quiste).  Cambios fibroqusticos. Estos ocurren cuando el tejido [employer] se vuelve ms denso, lo que, a la palpacin, puede hacer que se perciba como una cuerda o una superficie irregular debajo de la piel.  Tumores no cancerosos (benignos). Informe al mdico:  Acerca de cualquier alergia que tenga.  Si tiene implantes mamarios.  Si ha tenido enfermedades, biopsias o cirugas previas de Scientist, research (medical).  Si est amamantando.  Si es menor de 25aos.  Si tiene antecedentes familiares de cncer de mama.  Si est embarazada o podra estarlo. Cules son los riesgos? En general, se trata de un procedimiento seguro. Sin embargo, pueden ocurrir complicaciones, por ejemplo:  Exposicin a la radiacin. En [employer], los [place] de radiacin son muy bajos.  La interpretacin [employer].  La necesidad de Optometrist ms [employer].  La imposibilidad de la mamografa de Hydrographic surveyor algunos tipos de cncer. Qu ocurre  antes del procedimiento?  Programe el estudio para hacrselo aproximadamente 1 o 2semanas despus del perodo menstrual si todava menstra. Generalmente, este es el momento en que las mamas estn menos sensibles.  Si se hizo Musician en un centro diferente en el pasado, trate de conseguirla o solicite que la enven al nuevo centro de estudios. Se compararn las imgenes nuevas y las viejas.  El da del examen, lave sus mamas y la zona de las [place].  No use desodorantes, perfumes, lociones o talcos en ningn lugar del cuerpo el da del [place].  Qutese las alhajas del cuello.  Use prendas que pueda ponerse y sacarse fcilmente. Qu ocurre durante el procedimiento?  Tendr que desvestirse de la cintura para [place] y ponerse una bata de hospital que se abre al frente.  Debe permanecer de pie delante de la mquina de rayos-X.  Se colocar cada mama entre dos placas de [place] unos segundos. Durante el procedimiento, intente estar lo ms relajada posible. Esto no causa ningn dao a las [place], y, si siente [employer], ser pasajera.  Se tomarn radiografas desde diferentes ngulos de cada mama. El procedimiento puede variar segn el mdico y el hospital.   Sander Nephew ocurre despus del procedimiento?  La mamografa ser examinada por un especialista (radilogo).  En funcin de la calidad de [employer], es posible que tenga que repetir algunas partes del [place]. Generalmente, esto se realiza si el radilogo necesita una vista mejor del tejido [place].  Puede reanudar sus actividades normales.  Es su responsabilidad retirar [employer] del procedimiento. Pregntele a su mdico, o a un miembro  del personal del departamento donde se realice el procedimiento, cundo estarn [employer]. Resumen  Lavinia Sharps es un radiografa de baja energa de las mamas que se realiza para determinar si hay cambios anormales.  Un hombre puede hacerse una mamografa si tiene un bulto o hinchazn en la mama.  Si se hizo Musician en un centro diferente en el pasado, trate de conseguirla o solicite que la enven al nuevo centro de estudios con el fin de compararlas.  Programe el estudio para hacrselo aproximadamente 1 o 2semanas despus del perodo menstrual si todava menstra.  Para este examen, se colocar cada mama entre dos placas de [place] unos segundos.  Pregunte la fecha en que los resultados estarn disponibles. Asegrese de [employer]. Esta informacin no tiene Marine scientist el consejo del mdico. Asegrese de hacerle al mdico cualquier pregunta que tenga. Document Revised: 09/13/2018 Document Reviewed: 09/13/2018 Elsevier Patient Education  [place].

## 2021-04-02 NOTE — Progress Notes (Signed)
Established Patient Office Visit  Subjective:  Patient ID: Sabrina Mitchell, female    DOB: 23-Jan-1961  Age: 59 y.o. MRN: [id number]  CC:  Chief Complaint  Patient presents with  . Medication Refill    HPI Sabrina Mitchell reports that she is taking the 10 mg of Ambien.  Reports that she is sleeping 3 to 4 hours.  Reports this is much improved and she does not want to make any changes at this time.  Reports that she felt a burning sensation in both breasts last night, denies any lumps, color changes, nipple discharge.  Reports that it has resolved. Due to language barrier, an interpreter was present during the history-taking and subsequent discussion (and for part of the physical exam) with this patient.    Past Medical History:  Diagnosis Date  . Anemia   . Anxiety   . Complication of anesthesia   . Depression    denies  . End stage renal disease ([place])    hemodialysis 3x/wk @ [address]  . Family history of adverse reaction to anesthesia    sister vomiting after uterine cyst removal  . Gastritis   . GERD (gastroesophageal reflux disease)   . Hemorrhoids   . Hypertension   . Pneumonia    years ago  . PONV (postoperative nausea and vomiting)   . UTI (lower urinary tract infection) March 2014    Past Surgical History:  Procedure Laterality Date  . AV FISTULA PLACEMENT Left 07/05/09   Dr. Kellie Simmering  . COLONOSCOPY N/A 02/10/2016   SLF: 1. HEME postive stool due to colon polyps intrernal hemorrhoids 2. small internal hemrrohoids 3. moderate external hemorrhoids.   . ESOPHAGEAL DILATION N/A 02/10/2016   Procedure: ESOPHAGEAL DILATION;  Surgeon: Danie Binder, MD;  [facility];  Service: Endoscopy;  Laterality: N/A;  . ESOPHAGOGASTRODUODENOSCOPY N/A 02/10/2016   SLF: patent Schatzki's ring , mild gastritis/ duodentitis.   Marland Kitchen FISTULA SUPERFICIALIZATION Left 05/29/3761   Procedure: PLICATION BRACHIOCEPHALIC FISTULA;  Surgeon: Angelia Mould, MD;  Location: [place];  Service: Vascular;  Laterality: Left;  . FISTULOGRAM N/A 10/22/2014   Procedure: FISTULOGRAM;  Surgeon: Angelia Mould, MD;  Location: [facility] CATH LAB;  Service: Cardiovascular;  Laterality: N/A;  . IR DIALY SHUNT INTRO NEEDLE/INTRACATH INITIAL W/IMG LEFT Left 02/01/2019  . IR DIALY SHUNT INTRO NEEDLE/INTRACATH INITIAL W/IMG LEFT Left 06/05/2020  . PERIPHERAL VASCULAR CATHETERIZATION Left 12/09/2016   Procedure: A/V Fistulagram;  Surgeon: Waynetta Sandy, MD;  Location: [facility] CV LAB;  Service: Cardiovascular;  Laterality: Left;  . REVISON OF ARTERIOVENOUS FISTULA Left 83/15/1761   Procedure: PLICATION OF BRACHIOCEPHALIC FISTULA;  Surgeon: Conrad [place], MD;  Location: [place];  Service: Vascular;  Laterality: Left;  . REVISON OF ARTERIOVENOUS FISTULA Left 08/09/2017   Procedure: REVISION OF ARTERIOVENOUS FISTULA LIGATION OF SIDE BRANCH;  Surgeon: Waynetta Sandy, MD;  Location: [place];  Service: Vascular;  Laterality: Left;  . TUBAL LIGATION  2004    Family History  Problem Relation Age of Onset  . Hypertension Mother   . Hypertension Father   . Hypertension Sister   . Breast cancer Sister 20  . Diabetes Brother   . Hypertension Brother     Social History   Socioeconomic History  . Marital status: Significant Other    Spouse name: Not on file  . Number of children: 2  . Years of education: Not on file  . Highest education level: Not on  file  Occupational History  . Not on file  Tobacco Use  . Smoking status: Never Smoker  . Smokeless tobacco: Never Used  Vaping Use  . Vaping Use: Never used  Substance and Sexual Activity  . Alcohol use: No  . Drug use: No  . Sexual activity: Not Currently    Birth control/protection: Surgical  Other Topics Concern  . Not on file  Social History Narrative  . Not on file   Social Determinants of Health   Financial Resource Strain: Not on file  Food Insecurity: Not on file  Transportation  Needs: Not on file  Physical Activity: Not on file  Stress: Not on file  Social Connections: Not on file  Intimate Partner Violence: Not on file    Outpatient Medications Prior to Visit  Medication Sig Dispense Refill  . acetaminophen (TYLENOL) 325 MG tablet Take 650 mg by mouth every 6 (six) hours as needed for mild pain.     Marland Kitchen amLODipine (NORVASC) 10 MG tablet Take by mouth.    Marland Kitchen amLODipine (NORVASC) 5 MG tablet Take 5 mg by mouth in the morning and at bedtime.     . B Complex-C-Folic Acid (DIALYVITE 329) 0.8 MG TABS Take 1 tablet by mouth daily.    . calcium carbonate (TUMS - DOSED IN MG ELEMENTAL CALCIUM) 500 MG chewable tablet Chew 2 tablets by mouth 3 (three) times daily with meals.    . carvedilol (COREG) 6.25 MG tablet Take 1 tablet (6.25 mg total) by mouth at bedtime. (Patient taking differently: Take 6.25 mg by mouth 2 (two) times daily with a meal.) 180 tablet 3  . carvedilol (COREG) 6.25 MG tablet Take by mouth.    . ethyl chloride spray APPLY 1 APPLICATION TOPICALLY AS NEEDED (FOR DIALYSIS). 103.5 mL 0  . guaiFENesin-codeine 100-10 MG/5ML syrup Take 5 mLs by mouth 3 (three) times daily as needed for cough. 120 mL 0  . mirtazapine (REMERON) 15 MG tablet Take 0.5 tablets (7.5 mg total) by mouth at bedtime. 30 tablet 1  . omeprazole (PRILOSEC) 20 MG capsule Take 1 capsule (20 mg total) by mouth daily. 1 PO 30 mins prior to breakfast and supper (Patient taking differently: Take 20 mg by mouth daily as needed (acid reflux).) 30 capsule 3  . omeprazole (PRILOSEC) 20 MG capsule Take by mouth.    . sevelamer carbonate (RENVELA) 800 MG tablet Take by mouth.    . sucroferric oxyhydroxide (VELPHORO) 500 MG chewable tablet Chew by mouth.    . zolpidem (AMBIEN) 10 MG tablet Take 1 tablet (10 mg total) by mouth at bedtime as needed for sleep. 15 tablet 0  . Lanthanum Carbonate (FOSRENOL) 750 MG PACK Take 750 mg by mouth 3 (three) times daily with meals. (Patient not taking: No sig reported)     . Methoxy PEG-Epoetin Beta (MIRCERA IJ) Mircera (Patient not taking: No sig reported)    . oxyCODONE (OXY IR/ROXICODONE) 5 MG immediate release tablet Take by mouth. (Patient not taking: No sig reported)     No facility-administered medications prior to visit.    Allergies  Allergen Reactions  . Covid-19 (Mrna) Vaccine Swelling    Tongue swelling  . Zoloft [Sertraline]     CP and chills  . Amitriptyline Other (See Comments)    Makes her shake  . Trazodone And Nefazodone Other (See Comments)    Makes her shake    ROS Review of Systems  Constitutional: Positive for fatigue.  Eyes: Negative.   Respiratory:  Negative for shortness of breath.   Cardiovascular: Negative for chest pain.  Gastrointestinal: Negative.   Endocrine: Negative.   Genitourinary: Negative.   Musculoskeletal: Negative.   Skin: Negative.   Allergic/Immunologic: Negative.   Neurological: Negative.   Hematological: Negative.   Psychiatric/Behavioral: Positive for sleep disturbance.      Objective:    Physical Exam Vitals and nursing note reviewed.  Constitutional:      Appearance: Normal appearance.  HENT:     Head: Normocephalic and atraumatic.     Right Ear: External ear normal.     Left Ear: External ear normal.     Nose: Nose normal.     Mouth/Throat:     Mouth: Mucous membranes are moist.     Pharynx: Oropharynx is clear.  Eyes:     Extraocular Movements: Extraocular movements intact.     Conjunctiva/sclera: Conjunctivae normal.     Pupils: Pupils are equal, round, and reactive to light.  Cardiovascular:     Rate and Rhythm: Normal rate and regular rhythm.     Heart sounds: Murmur heard.    Pulmonary:     Effort: Pulmonary effort is normal.     Breath sounds: Normal breath sounds.  Musculoskeletal:        General: Normal range of motion.     Cervical back: Normal range of motion and neck supple.  Skin:    Comments: Dialysis port present  Neurological:     General: No focal  deficit present.     Mental Status: She is alert and oriented to person, place, and time.  Psychiatric:        Mood and Affect: Mood normal.        Behavior: Behavior normal.        Thought Content: Thought content normal.        Judgment: Judgment normal.     Resp 18   Ht 5\' 2"  (1.575 m)   Wt 128 lb (58.1 kg)   LMP 02/11/2012 Comment: tubal ligation  BMI 23.41 kg/m  Wt Readings from Last 3 Encounters:  04/02/21 128 lb (58.1 kg)  11/08/20 123 lb 9.6 oz (56.1 kg)  10/30/20 129 lb (58.5 kg)     Health Maintenance Due  Topic Date Due  . COVID-19 Vaccine (2 - Pfizer 3-dose series) 04/02/2020  . MAMMOGRAM  06/28/2020  . PAP SMEAR-Modifier  12/10/2020    There are no preventive care reminders to display for this patient.  Lab Results  Component Value Date   TSH 1.130 03/12/2021   Lab Results  Component Value Date   WBC 5.1 03/12/2021   HGB 10.1 (L) 03/12/2021   HCT 30.6 (L) 03/12/2021   MCV 96 03/12/2021   PLT 232 03/12/2021   Lab Results  Component Value Date   NA 136 02/22/2021   K 2.8 (L) 02/22/2021   CO2 28 02/22/2021   GLUCOSE 97 02/22/2021   BUN 17 02/22/2021   CREATININE 4.04 (H) 02/22/2021   BILITOT 0.6 09/26/2019   ALKPHOS 88 09/26/2019   AST 39 09/26/2019   ALT 33 09/26/2019   PROT 6.9 09/26/2019   ALBUMIN 3.4 (L) 09/27/2019   CALCIUM 9.8 02/22/2021   ANIONGAP 14 02/22/2021   Lab Results  Component Value Date   CHOL 195 03/12/2021   Lab Results  Component Value Date   HDL 65 03/12/2021   Lab Results  Component Value Date   LDLCALC 111 (H) 03/12/2021   Lab Results  Component Value Date  TRIG 109 03/12/2021   Lab Results  Component Value Date   CHOLHDL 3.0 03/12/2021   Lab Results  Component Value Date   HGBA1C 4.8 03/22/2017      Assessment & Plan:   Problem List Items Addressed This Visit      Genitourinary   End stage renal disease on dialysis ([place]) (Chronic)     Other   Psychophysiological insomnia - Primary    Relevant Medications   zolpidem (AMBIEN) 10 MG tablet    Other Visit Diagnoses    Pain of both breasts        1. Psychophysiological insomnia Refill.  Patient education given on Ambien controlled release, explained that this may offer more relief.  Patient does not want to change medication at this time.  Patient has appointment to see primary care provider on May 05, 2021.  Patient encouraged to return to mobile unit if needed. - zolpidem (AMBIEN) 10 MG tablet; Take 1 tablet (10 mg total) by mouth at bedtime as needed for sleep.  Dispense: 30 tablet; Refill: 0  2. End stage renal disease on dialysis ([place])   3.  Pain in both breasts Currently resolved.  Patient was once again given contact information for her previously ordered mammogram.   I have reviewed the patient's medical history (PMH, PSH, Social History, Family History, Medications, and allergies) , and have been updated if relevant. I spent 22 minutes reviewing chart and  face to face time with patient.    Meds ordered this encounter  Medications  . zolpidem (AMBIEN) 10 MG tablet    Sig: Take 1 tablet (10 mg total) by mouth at bedtime as needed for sleep.    Dispense:  30 tablet    Refill:  0    Order Specific Question:   Supervising Provider    Answer:   Elsie Stain [[id number]]    Follow-up: Return if symptoms worsen or fail to improve.    Loraine Grip Mayers, PA-C

## 2021-04-16 ENCOUNTER — Other Ambulatory Visit: Payer: Self-pay

## 2021-04-16 MED FILL — Ethyl Chloride Aerosol Spray: CUTANEOUS | 30 days supply | Qty: 116 | Fill #0 | Status: AC

## 2021-04-16 MED FILL — Carvedilol Tab 6.25 MG: ORAL | 30 days supply | Qty: 30 | Fill #0 | Status: AC

## 2021-04-25 ENCOUNTER — Other Ambulatory Visit: Payer: Self-pay

## 2021-04-25 MED FILL — Ethyl Chloride Aerosol Spray: CUTANEOUS | 30 days supply | Qty: 116 | Fill #1 | Status: AC

## 2021-05-05 ENCOUNTER — Encounter: Payer: Self-pay | Admitting: Internal Medicine

## 2021-05-05 ENCOUNTER — Other Ambulatory Visit: Payer: Self-pay

## 2021-05-05 ENCOUNTER — Ambulatory Visit: Payer: Self-pay | Admitting: Internal Medicine

## 2021-05-05 MED FILL — Carvedilol Tab 6.25 MG: ORAL | 30 days supply | Qty: 30 | Fill #2 | Status: AC

## 2021-05-05 MED FILL — Carvedilol Tab 6.25 MG: ORAL | 30 days supply | Qty: 30 | Fill #1 | Status: AC

## 2021-05-05 MED FILL — Amlodipine Besylate Tab 5 MG (Base Equivalent): ORAL | 30 days supply | Qty: 30 | Fill #0 | Status: AC

## 2021-05-05 MED FILL — Amlodipine Besylate Tab 5 MG (Base Equivalent): ORAL | 30 days supply | Qty: 30 | Fill #1 | Status: AC

## 2021-05-06 ENCOUNTER — Other Ambulatory Visit (HOSPITAL_COMMUNITY): Payer: Self-pay | Admitting: Nephrology

## 2021-05-06 DIAGNOSIS — N186 End stage renal disease: Secondary | ICD-10-CM

## 2021-05-08 ENCOUNTER — Other Ambulatory Visit: Payer: Self-pay | Admitting: Radiology

## 2021-05-08 ENCOUNTER — Other Ambulatory Visit: Payer: Self-pay | Admitting: Student

## 2021-05-09 ENCOUNTER — Ambulatory Visit (HOSPITAL_COMMUNITY)
Admission: RE | Admit: 2021-05-09 | Discharge: 2021-05-09 | Disposition: A | Discharge status: Home / Self Care | Payer: Self-pay | Source: Ambulatory Visit | Attending: Nephrology | Admitting: Nephrology

## 2021-05-09 ENCOUNTER — Other Ambulatory Visit: Payer: Self-pay

## 2021-05-09 DIAGNOSIS — Y841 Kidney dialysis as the cause of abnormal reaction of the patient, or of later complication, without mention of misadventure at the time of the procedure: Secondary | ICD-10-CM | POA: Insufficient documentation

## 2021-05-09 DIAGNOSIS — D509 Iron deficiency anemia, unspecified: Secondary | ICD-10-CM | POA: Insufficient documentation

## 2021-05-09 DIAGNOSIS — T82510A Breakdown (mechanical) of surgically created arteriovenous fistula, initial encounter: Secondary | ICD-10-CM | POA: Insufficient documentation

## 2021-05-09 DIAGNOSIS — I12 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 disease: Secondary | ICD-10-CM | POA: Insufficient documentation

## 2021-05-09 DIAGNOSIS — Z992 Dependence on renal dialysis: Secondary | ICD-10-CM | POA: Insufficient documentation

## 2021-05-09 DIAGNOSIS — Z885 Allergy status to narcotic agent status: Secondary | ICD-10-CM | POA: Insufficient documentation

## 2021-05-09 DIAGNOSIS — Z888 Allergy status to other drugs, medicaments and biological substances status: Secondary | ICD-10-CM | POA: Insufficient documentation

## 2021-05-09 DIAGNOSIS — Z79899 Other long term (current) drug therapy: Secondary | ICD-10-CM | POA: Insufficient documentation

## 2021-05-09 DIAGNOSIS — Z882 Allergy status to sulfonamides status: Secondary | ICD-10-CM | POA: Insufficient documentation

## 2021-05-09 DIAGNOSIS — N186 End stage renal disease: Secondary | ICD-10-CM | POA: Insufficient documentation

## 2021-05-09 DIAGNOSIS — E1122 Type 2 diabetes mellitus with diabetic chronic kidney disease: Secondary | ICD-10-CM | POA: Insufficient documentation

## 2021-05-09 HISTORY — PX: IR DIALY SHUNT INTRO NEEDLE/INTRACATH INITIAL W/IMG LEFT: IMG6102

## 2021-05-09 MED ORDER — SODIUM CHLORIDE 0.9 % IV SOLN: INTRAVENOUS | Status: DC

## 2021-05-09 MED ORDER — FENTANYL CITRATE (PF) 100 MCG/2ML IJ SOLN
INTRAMUSCULAR | Status: AC | PRN
  Administered 2021-05-09 (×2): 25 ug via INTRAVENOUS

## 2021-05-09 MED ORDER — IOHEXOL 300 MG/ML  SOLN
50.0000 mL | Freq: Once | INTRAMUSCULAR | Status: AC | PRN
  Administered 2021-05-09: 25 mL via INTRAVENOUS

## 2021-05-09 MED ORDER — FENTANYL CITRATE (PF) 100 MCG/2ML IJ SOLN
INTRAMUSCULAR | Status: AC
  Filled 2021-05-09: qty 2

## 2021-05-09 NOTE — Sedation Documentation (Signed)
Fistula study completed. Returned to Radiology Nurse's Station for last set of vitals and to remove IV.  Patient wheeled to lobby in wheelchair, observed getting into private vehicle. Discharged in stable condition with daughter.

## 2021-05-09 NOTE — H&P (Signed)
Chief Complaint: Pain at AVF with dialysis. Request is for fistulagram with possible intervention. Patient has a left upper arm brachiocephalic fistulagram that was created approximately 11 years ago. Plicated on 40.9.81 and 10.31.16. Revised  on 8.13.18.   Referring Physician(s): Webb,Martin  Supervising Physician: Sandi Mariscal  Patient Status: [facility] - Out-pt  History of Present Illness: Sabrina Mitchell is a 60 y.o. female Spanish speaking. History of DM, HTN, iron deficiency anemia, ESRD s/p left upper arm brachiocephalic fistula created approximately 11 years ago. Per Epic AVF plicated on 19.1.47 and 10.31.16. Revised  on 8.13.18. Last fistulagram performed in IR on 6.10.21 showed the AVF to be widely patent.  Patient is reporting pain during dialysis at her AVF that radiates to her arm. Patient states that she cannot tolerate an entire dialysis session and occasionally has to end the session at 3 hours instead of 3.5. Team is requesting a fistulagram with possible intervention.   Currently without any significant complaints. Patient alert and laying in bed, anxious. Denies any fevers, headache, chest pain, SOB, cough, abdominal pain, nausea, vomiting or bleeding. Return precautions and treatment recommendations and follow-up discussed with the patient who is agreeable with the plan. With a Spanish interpretor at bedside. Discussed care of fistulagram and possible intervention with patient and her daughter. All questions were answered and concerns were addressed. Patient and daughter agree with plan.   Past Medical History:  Diagnosis Date  . Anemia   . Anxiety   . Complication of anesthesia   . Depression    denies  . End stage renal disease ([place])    hemodialysis 3x/wk @ [address]  . Family history of adverse reaction to anesthesia    sister vomiting after uterine cyst removal  . Gastritis   . GERD (gastroesophageal reflux disease)   . Hemorrhoids   .  Hypertension   . Pneumonia    years ago  . PONV (postoperative nausea and vomiting)   . UTI (lower urinary tract infection) March 2014    Past Surgical History:  Procedure Laterality Date  . AV FISTULA PLACEMENT Left 07/05/09   Dr. Kellie Simmering  . COLONOSCOPY N/A 02/10/2016   SLF: 1. HEME postive stool due to colon polyps intrernal hemorrhoids 2. small internal hemrrohoids 3. moderate external hemorrhoids.   . ESOPHAGEAL DILATION N/A 02/10/2016   Procedure: ESOPHAGEAL DILATION;  Surgeon: Danie Binder, MD;  [facility];  Service: Endoscopy;  Laterality: N/A;  . ESOPHAGOGASTRODUODENOSCOPY N/A 02/10/2016   SLF: patent Schatzki's ring , mild gastritis/ duodentitis.   Marland Kitchen FISTULA SUPERFICIALIZATION Left 82/08/5620   Procedure: PLICATION BRACHIOCEPHALIC FISTULA;  Surgeon: Angelia Mould, MD;  Location: [place];  Service: Vascular;  Laterality: Left;  . FISTULOGRAM N/A 10/22/2014   Procedure: FISTULOGRAM;  Surgeon: Angelia Mould, MD;  Location: [facility] CATH LAB;  Service: Cardiovascular;  Laterality: N/A;  . IR DIALY SHUNT INTRO NEEDLE/INTRACATH INITIAL W/IMG LEFT Left 02/01/2019  . IR DIALY SHUNT INTRO NEEDLE/INTRACATH INITIAL W/IMG LEFT Left 06/05/2020  . PERIPHERAL VASCULAR CATHETERIZATION Left 12/09/2016   Procedure: A/V Fistulagram;  Surgeon: Waynetta Sandy, MD;  Location: [place] CV LAB;  Service: Cardiovascular;  Laterality: Left;  . REVISON OF ARTERIOVENOUS FISTULA Left 30/86/5784   Procedure: PLICATION OF BRACHIOCEPHALIC FISTULA;  Surgeon: Conrad Allyn, MD;  Location: [place];  Service: Vascular;  Laterality: Left;  . REVISON OF ARTERIOVENOUS FISTULA Left 08/09/2017   Procedure: REVISION OF ARTERIOVENOUS FISTULA LIGATION OF SIDE BRANCH;  Surgeon: Servando Snare  Harrell Gave, MD;  Location: [place];  Service: Vascular;  Laterality: Left;  . TUBAL LIGATION  2004    Allergies: Covid-19 (mrna) vaccine, Remeron [mirtazapine], Zoloft [sertraline], Amitriptyline, and Trazodone  and nefazodone  Medications: Prior to Admission medications   Medication Sig Start Date End Date Taking? Authorizing Provider  acetaminophen (TYLENOL) 500 MG tablet Take 500-1,000 mg by mouth every 6 (six) hours as needed for moderate pain or headache.   Yes [provider]  amLODipine (NORVASC) 5 MG tablet TAKE 1 TABLET BY MOUTH ONCE A DAY Patient taking differently: Take 5 mg by mouth daily. 01/30/21 01/30/22 Yes Penninger, Ria Comment, PA  B Complex-C-Folic Acid (DIALYVITE 623) 0.8 MG TABS Take 1 tablet by mouth every evening. 09/06/19  Yes [provider]  carvedilol (COREG) 6.25 MG tablet TAKE 1 TABLET BY MOUTH ONCE A DAY AS DIRECTED FOR HIGH BLOOD PRESSURE Patient taking differently: Take 6.25 mg by mouth daily. 01/30/21 01/30/22 Yes Penninger, Ria Comment, PA  ethyl chloride spray APPLY 1 APPLICATION TOPICALLY AS NEEDED (FOR DIALYSIS). 02/17/19  Yes Ladell Pier, MD  Omega-3 Fatty Acids (OMEGA 3 500) 500 MG CAPS Take 500 mg by mouth every other day.   Yes [provider]  omeprazole (PRILOSEC) 20 MG capsule Take 1 capsule (20 mg total) by mouth daily. 1 PO 30 mins prior to breakfast and supper Patient taking differently: Take 20 mg by mouth daily before breakfast. 08/06/17  Yes Ladell Pier, MD  sucroferric oxyhydroxide (VELPHORO) 500 MG chewable tablet Chew 500-1,000 mg by mouth 3 (three) times daily with meals. 11/07/19  Yes [provider]  zolpidem (AMBIEN) 10 MG tablet Take 1 tablet (10 mg total) by mouth at bedtime as needed for sleep. Patient taking differently: Take 10 mg by mouth at bedtime. 04/02/21  Yes Mayers, Cari S, PA-C  albuterol (VENTOLIN HFA) 108 (90 Base) MCG/ACT inhaler INHALE 2 PUFF USING INHALER EVERY FOUR TO SIX HOURS WHILE AWAKE AS NEEDED FOR WHEEZING, COUGH, OR SHORTNESS OF BREATH Patient not taking: No sig reported 02/20/21 02/20/22  Loren Racer, PA-C  amLODipine (NORVASC) 10 MG tablet TAKE 1 TABLET BY MOUTH ONCE A DAY Patient not  taking: No sig reported 08/06/20 08/06/21  Lynnda Child, PA-C  amoxicillin-clavulanate (AUGMENTIN) 500-125 MG tablet TAKE 1 TABLET BY MOUTH ONCE A DAY Patient not taking: No sig reported 02/20/21 02/20/22  Loren Racer, PA-C  benzonatate (TESSALON) 100 MG capsule TAKE 1 CAPSULE BY MOUTH TWICE A DAY AS NEEDED FOR COUGH Patient not taking: No sig reported 02/20/21 02/20/22  Loren Racer, PA-C  carvedilol (COREG) 6.25 MG tablet TAKE 1 TABLET BY MOUTH ONCE A DAY AS DIRECTED FOR HIGH BLOOD PRESSURE Patient not taking: No sig reported 08/06/20 08/06/21  Lynnda Child, PA-C  ethyl chloride spray SPRAY 1 AS DIRECTED TO SKIN THREE TIMES A WEEK AS DIRECTED APPLY A SMALL AMOUNT OF SPRAY TO DIALYSIS ACCESS JUST BEFORE NEEDLE STICK Patient not taking: No sig reported 01/30/21 01/30/22  Penninger, Ria Comment, PA  ethyl chloride spray APPLY A SMALL AMOUNT OF SPRAY TO DIALYSIS ACCESS JUST BEFORE NEEDLE STICK THREE TIMES A WEEK AS DIRECTED Patient not taking: No sig reported 08/06/20 08/06/21  Lynnda Child, PA-C  guaiFENesin-codeine 100-10 MG/5ML syrup Take 5 mLs by mouth 3 (three) times daily as needed for cough. Patient not taking: No sig reported 02/22/21   Hayden Rasmussen, MD  mirtazapine (REMERON) 15 MG tablet Take 0.5 tablets (7.5 mg total) by mouth at bedtime. Patient  not taking: No sig reported 11/08/20   Ladell Pier, MD  mirtazapine (REMERON) 15 MG tablet TAKE 0.5 TABLETS (7.5 MG TOTAL) BY MOUTH AT BEDTIME. Patient not taking: No sig reported 11/08/20 11/08/21  Ladell Pier, MD  sertraline (ZOLOFT) 25 MG tablet TAKE 1 TABLET (25 MG TOTAL) BY MOUTH AT BEDTIME. Patient not taking: No sig reported 10/30/20 10/30/21  Swords, Darrick Penna, MD     Family History  Problem Relation Age of Onset  . Hypertension Mother   . Hypertension Father   . Hypertension Sister   . Breast cancer Sister 23  . Diabetes Brother   . Hypertension Brother     Social History   Socioeconomic  History  . Marital status: Significant Other    Spouse name: Not on file  . Number of children: 2  . Years of education: Not on file  . Highest education level: Not on file  Occupational History  . Not on file  Tobacco Use  . Smoking status: Never Smoker  . Smokeless tobacco: Never Used  Vaping Use  . Vaping Use: Never used  Substance and Sexual Activity  . Alcohol use: No  . Drug use: No  . Sexual activity: Not Currently    Birth control/protection: Surgical  Other Topics Concern  . Not on file  Social History Narrative  . Not on file   Social Determinants of Health   Financial Resource Strain: Not on file  Food Insecurity: Not on file  Transportation Needs: Not on file  Physical Activity: Not on file  Stress: Not on file  Social Connections: Not on file    Review of Systems: A 12 point ROS discussed and pertinent positives are indicated in the HPI above.  All other systems are negative.  Review of Systems  Constitutional: Negative for fatigue and fever.  HENT: Negative for congestion.   Respiratory: Negative for cough and shortness of breath.   Gastrointestinal: Negative for abdominal pain, diarrhea, nausea and vomiting.    Vital Signs: LMP 02/11/2012 Comment: tubal ligation  Physical Exam Vitals and nursing note reviewed.  Constitutional:      Appearance: She is well-developed.  HENT:     Head: Normocephalic and atraumatic.  Eyes:     Conjunctiva/sclera: Conjunctivae normal.  Cardiovascular:     Rate and Rhythm: Normal rate and regular rhythm.     Heart sounds: Normal heart sounds.     Comments:  Left upper extremity AVF +/+ Pulmonary:     Effort: Pulmonary effort is normal.     Breath sounds: Normal breath sounds.  Musculoskeletal:     Cervical back: Normal range of motion.  Neurological:     Mental Status: She is alert and oriented to person, place, and time.     Imaging: No results found.  Labs:  CBC: Recent Labs    02/22/21 1817  03/12/21 0954  WBC 6.8 5.1  HGB 11.8* 10.1*  HCT 36.1 30.6*  PLT 178 232    COAGS: No results for input(s): INR, APTT in the last 8760 hours.  BMP: Recent Labs    02/22/21 1817  NA 136  K 2.8*  CL 94*  CO2 28  GLUCOSE 97  BUN 17  CALCIUM 9.8  CREATININE 4.04*  GFRNONAA 12*    LIVER FUNCTION TESTS: No results for input(s): BILITOT, AST, ALT, ALKPHOS, PROT, ALBUMIN in the last 8760 hours.   Assessment and Plan:  60 y.o. Spanish speaking female. History of DM, HTN, iron deficiency  anemia, ESRD s/p left upper arm brachiocephalic fistula created approximately 11 years ago. Per Epic AVF plicated on 77.8.24 and 10.31.16. Revised  on 8.13.18. Last fistulagram performed in IR on 6.10.21 showed the AVF to be widely patent.  Patient is reporting pain during dialysis at her AVF that radiates to her arm. Patient states that she cannot tolerate an entire dialysis session and occasionally has to end the session at 3 hours instead of 3.5. Team is requesting a fistulagram with possible intervention.   No recent labs.  All medications are within acceptable parameters. No pertinent allergies. Patient has been NPO since midnight.    Risks and benefits discussed with the patient including, but not limited to bleeding, infection, vascular injury, pulmonary embolism, need for tunneled HD catheter placement or even death.  All of the patient's questions were answered, patient is agreeable to proceed. Consent signed and in chart.   Thank you for this interesting consult.  I greatly enjoyed meeting Sabrina Mitchell and look forward to participating in their care.  A copy of this report was sent to the requesting provider on this date.  Electronically Signed: Jacqualine Mau, NP 05/09/2021, 7:19 AM   I spent a total of  30 Minutes   in face to face in clinical consultation, greater than 50% of which was counseling/coordinating care for fistulagram

## 2021-05-09 NOTE — Procedures (Signed)
Pre-procedure Diagnosis: ESRD Post-procedure Diagnosis: Same  Normally functioning left upper arm AVF without peripheral or central stenosis.  No intervention performed.   Complications: None Immediate EBL: Minimal   Ronny Bacon, MD Pager #: [phone]

## 2021-05-09 NOTE — Progress Notes (Signed)
   05/09/21 0920  Vital Signs  Pulse Rate 67  Pulse Rate Source Monitor  Resp 18  BP (!) 150/68  BP Location Right Arm  BP Method Automatic  Patient Position (if appropriate) Lying  Oxygen Therapy  SpO2 99 %  O2 Device Room Air  MEWS Score  MEWS Temp 0  MEWS Systolic 0  MEWS Pulse 0  MEWS RR 0  MEWS LOC 0  MEWS Score 0  MEWS Score Color Green   Fistula study completed. Returned to Radiology Nurse's Station for last set of vitals and to remove IV.  Patient wheeled to lobby in wheelchair, observed getting into private vehicle. Discharged in stable condition with daughter.

## 2021-05-14 ENCOUNTER — Other Ambulatory Visit: Payer: Self-pay | Admitting: *Deleted

## 2021-05-14 DIAGNOSIS — F5104 Psychophysiologic insomnia: Secondary | ICD-10-CM

## 2021-05-14 MED ORDER — ZOLPIDEM TARTRATE 10 MG PO TABS: 10.0000 mg | ORAL_TABLET | Freq: Every evening | ORAL | 0 refills | Status: DC | PRN

## 2021-05-23 ENCOUNTER — Telehealth: Payer: Self-pay | Admitting: Internal Medicine

## 2021-05-23 ENCOUNTER — Other Ambulatory Visit: Payer: Self-pay

## 2021-05-23 MED ORDER — AMLODIPINE BESYLATE 5 MG PO TABS
1.0000 | ORAL_TABLET | Freq: Every day | ORAL | 3 refills | Status: DC
  Filled 2021-05-23: qty 30, 30d supply, fill #0

## 2021-05-23 MED ORDER — AMLODIPINE BESYLATE 5 MG PO TABS
ORAL_TABLET | ORAL | 3 refills | Status: DC
  Filled 2021-05-23: qty 60, 30d supply, fill #0

## 2021-05-23 MED ORDER — AMLODIPINE BESYLATE 5 MG PO TABS
ORAL_TABLET | ORAL | 3 refills | Status: DC
  Filled 2021-05-23 (×2): qty 60, 30d supply, fill #0
  Filled 2021-06-26: qty 60, 30d supply, fill #1
  Filled 2021-07-25: qty 60, 30d supply, fill #2
  Filled 2021-08-20: qty 60, 30d supply, fill #3
  Filled 2021-09-17: qty 60, 30d supply, fill #4
  Filled 2021-10-13: qty 60, 30d supply, fill #5
  Filled 2021-11-13: qty 60, 30d supply, fill #6
  Filled 2021-12-10: qty 60, 30d supply, fill #7
  Filled 2022-01-09: qty 60, 30d supply, fill #0
  Filled 2022-02-09: qty 60, 30d supply, fill #1
  Filled 2022-03-12: qty 60, 30d supply, fill #2
  Filled 2022-04-08: qty 60, 30d supply, fill #3

## 2021-05-28 ENCOUNTER — Other Ambulatory Visit: Payer: Self-pay

## 2021-05-28 ENCOUNTER — Ambulatory Visit: Payer: Self-pay | Admitting: Physician Assistant

## 2021-05-28 VITALS — BP 123/58 | HR 74 | Wt 130.0 lb

## 2021-05-28 DIAGNOSIS — N186 End stage renal disease: Secondary | ICD-10-CM

## 2021-05-28 DIAGNOSIS — Z992 Dependence on renal dialysis: Secondary | ICD-10-CM

## 2021-05-28 DIAGNOSIS — L309 Dermatitis, unspecified: Secondary | ICD-10-CM

## 2021-05-28 DIAGNOSIS — F5104 Psychophysiologic insomnia: Secondary | ICD-10-CM

## 2021-05-28 MED ORDER — NYSTATIN-TRIAMCINOLONE 100000-0.1 UNIT/GM-% EX OINT
1.0000 "application " | TOPICAL_OINTMENT | Freq: Two times a day (BID) | CUTANEOUS | 0 refills | Status: DC
  Filled 2021-05-28: qty 30, 15d supply, fill #0

## 2021-05-28 NOTE — Patient Instructions (Signed)
For your itching, I sent a prescription cream to your pharmacy, you will use this twice a day until your symptoms resolve.  It is fine to reduce the dosing of Ambien to 5 mg.  Please feel free to return to the mobile unit if you continue to have any episodes of sleepwalking as you described.  Please let us know if there is anything else we can do for you  Kennieth Rad, PA-C Physician Assistant [facility] [email or web address]    Dermatitis atpica Atopic Dermatitis La dermatitis atpica es un trastorno de la piel que causa inflamacin. Se caracteriza por una erupcin roja y piel seca y escamosa con comezn. Es el tipo ms frecuente de eccema. El eccema es un grupo de afecciones de la piel que hacen que esta se ponga spera e hinchada. Esta afeccin, generalmente, empeora durante los meses fros del invierno y suele mejorar durante los meses clidos del verano. La dermatitis atpica, normalmente, comienza a manifestarse en la infancia y puede durar hasta la [place]. Esta afeccin no se transmite de [place] persona a otra (no es contagiosa). La dermatitis atpica puede no estar siempre presente, pero cuando lo est, se denomina brote. Cules son las causas? Se desconoce la causa exacta de esta afeccin. Los brotes pueden desencadenarse por:  Dietitian en contacto con alguna cosa a la que es sensible o alrgico (alrgeno).  Estrs.  Ciertos alimentos.  Clima extremadamente clido o fro.  Jabones y sustancias qumicas fuertes.  Aire seco.  Cloro. Qu incrementa el riesgo? Es ms probable que esta afeccin se desarrolle en personas con antecedentes familiares de:  Eccema.  Alergias.  Asma.  Fiebre del heno. Cules son los signos o los sntomas? Los sntomas de esta afeccin incluyen:  Piel seca y escamosa.  Erupcin roja y que pica.  Picazn, que puede ser muy intensa. Puede ocurrir antes de la erupcin en la piel. Esto  puede dificultar el sueo.  Engrosamiento y Paramedic de la piel que pueden producirse con Physiological scientist.   Cmo se diagnostica? Esta afeccin se diagnostica en funcin de lo siguiente:  Sus sntomas.  Sus antecedentes mdicos.  Un examen fsico. Cmo se trata? No hay cura para esta afeccin, pero los sntomas, normalmente, se pueden controlar. El tratamiento se centra en lo siguiente:  Controlar la picazn y el rascado. Probablemente, le receten medicamentos, como antihistamnicos o cremas corticoesteroides.  Limitar la exposicin a alrgenos.  Reconocer situaciones que causan estrs e idear un plan para controlarlo. Si la dermatitis atpica no mejora con medicamentos o si est presente en todo el cuerpo (diseminada), puede utilizarse un tratamiento con un tipo de luz especfico (fototerapia). Siga estas instrucciones en su casa: Cuidado de la piel  Mantenga la piel bien humectada. Al hacerlo, quedar hmeda y ayudar a prevenir la sequedad. ? Utilice lociones sin perfume que contengan vaselina. ? Evite las lociones que contienen alcohol o agua. Pueden secar la piel.  Tome baos o duchas de corta duracin (menos de 5 minutos) en agua tibia. No use agua caliente. ? Use jabones suaves y sin perfume para baarse. Evite el jabn y el bao de espuma. ? Aplique un humectante para la piel inmediatamente despus de un bao o una ducha.  No aplique nada sobre la piel sin Teacher, adult education a su mdico.   Instrucciones generales  Use o aplquese los medicamentos de venta libre y los recetados solamente como se lo haya indicado el mdico.  Vstase con ropa de algodn o International aid/development worker de algodn.  Vstase con ropas ligeras, ya que el calor aumenta la picazn.  [place], enjuguela dos veces para eliminar todo el [place].  Evite cualquier factor desencadenante que pueda causar un brote.  Brandon uas cortas.  Evite rascarse. El rascado hace que la erupcin y la picazn empeoren. Una  rotura en la piel por rascarse podra provocar una infeccin cutnea (imptigo).  No est cerca de personas que tengan herpes labial o ampollas febriles. Si se produce la infeccin, puede hacer que la dermatitis atpica empeore.  Cumpla con todas las visitas de seguimiento. Esto es importante. Comunquese con un mdico si:  La picazn le impide dormir.  La erupcin empeora o no mejora en el plazo de una semana despus de iniciar el tratamiento.  Tiene fiebre.  Aparece un brote despus de estar en contacto con alguien que tiene herpes labial o ampollas febriles. Solicite ayuda de inmediato si:  Tiene pus o costras amarillas en la zona de la erupcin. Resumen  La dermatitis atpica ocasiona una erupcin roja y piel seca y escamosa con comezn.  El tratamiento se enfoca en controlar la picazn y el rascado, limitar la exposicin a cosas a las que es sensible o Air cabin crew (alrgenos), reconocer situaciones que causan estrs e idear un plan para Engineer, maintenance (IT).  Mantenga la piel bien humectada.  Tome baos o duchas de menos de 5 minutos y use agua tibia. No use agua caliente. Esta informacin no tiene Marine scientist el consejo del mdico. Asegrese de hacerle al mdico cualquier pregunta que tenga. Document Revised: 10/15/2020 Document Reviewed: 10/15/2020 Elsevier Patient Education  2021 [employer].

## 2021-05-28 NOTE — Progress Notes (Signed)
Established Patient Office Visit  Subjective:  Patient ID: Sabrina Mitchell, female    DOB: [birth year]  Age: 60 y.o. MRN: [id number]  CC:  Chief Complaint  Patient presents with  . Rash    HPI Sabrina Mitchell states that she has been having some itching on her lower back, and she has a small pimple right above her buttock.  States that she has been having the itching for several months.  States that she has used vaseline without relief.   Denies any new detergents, medications, lotions, fragrances or lotions  States that the Ambien is offering relief from her insomnia at 10 mg, however she does note that she had an episode of sleepwalking and would like to return to 5 mg.    Due to language barrier, an interpreter was present during the history-taking and subsequent discussion (and for part of the physical exam) with this patient.   Past Medical History:  Diagnosis Date  . Anemia   . Anxiety   . Complication of anesthesia   . Depression    denies  . End stage renal disease ([place])    hemodialysis 3x/wk @ [address]  . Family history of adverse reaction to anesthesia    sister vomiting after uterine cyst removal  . Gastritis   . GERD (gastroesophageal reflux disease)   . Hemorrhoids   . Hypertension   . Pneumonia    years ago  . PONV (postoperative nausea and vomiting)   . UTI (lower urinary tract infection) March 2014    Past Surgical History:  Procedure Laterality Date  . AV FISTULA PLACEMENT Left 07/05/09   Dr. Kellie Simmering  . COLONOSCOPY N/A 02/10/2016   SLF: 1. HEME postive stool due to colon polyps intrernal hemorrhoids 2. small internal hemrrohoids 3. moderate external hemorrhoids.   . ESOPHAGEAL DILATION N/A 02/10/2016   Procedure: ESOPHAGEAL DILATION;  Surgeon: Danie Binder, MD;  [facility];  Service: Endoscopy;  Laterality: N/A;  . ESOPHAGOGASTRODUODENOSCOPY N/A 02/10/2016   SLF: patent Schatzki's ring , mild gastritis/  duodentitis.   Marland Kitchen FISTULA SUPERFICIALIZATION Left 44/07/1855   Procedure: PLICATION BRACHIOCEPHALIC FISTULA;  Surgeon: Angelia Mould, MD;  Location: [place];  Service: Vascular;  Laterality: Left;  . FISTULOGRAM N/A 10/22/2014   Procedure: FISTULOGRAM;  Surgeon: Angelia Mould, MD;  Location: [facility] CATH LAB;  Service: Cardiovascular;  Laterality: N/A;  . IR DIALY SHUNT INTRO NEEDLE/INTRACATH INITIAL W/IMG LEFT Left 02/01/2019  . IR DIALY SHUNT INTRO NEEDLE/INTRACATH INITIAL W/IMG LEFT Left 06/05/2020  . IR DIALY SHUNT INTRO NEEDLE/INTRACATH INITIAL W/IMG LEFT Left 05/09/2021  . PERIPHERAL VASCULAR CATHETERIZATION Left 12/09/2016   Procedure: A/V Fistulagram;  Surgeon: Waynetta Sandy, MD;  Location: [place] CV LAB;  Service: Cardiovascular;  Laterality: Left;  . REVISON OF ARTERIOVENOUS FISTULA Left 31/49/7026   Procedure: PLICATION OF BRACHIOCEPHALIC FISTULA;  Surgeon: Conrad Lolo, MD;  Location: [place];  Service: Vascular;  Laterality: Left;  . REVISON OF ARTERIOVENOUS FISTULA Left 08/09/2017   Procedure: REVISION OF ARTERIOVENOUS FISTULA LIGATION OF SIDE BRANCH;  Surgeon: Waynetta Sandy, MD;  Location: [place];  Service: Vascular;  Laterality: Left;  . TUBAL LIGATION  2004    Family History  Problem Relation Age of Onset  . Hypertension Mother   . Hypertension Father   . Hypertension Sister   . Breast cancer Sister 81  . Diabetes Brother   . Hypertension Brother     Social History   Socioeconomic  History  . Marital status: Significant Other    Spouse name: Not on file  . Number of children: 2  . Years of education: Not on file  . Highest education level: Not on file  Occupational History  . Not on file  Tobacco Use  . Smoking status: Never Smoker  . Smokeless tobacco: Never Used  Vaping Use  . Vaping Use: Never used  Substance and Sexual Activity  . Alcohol use: No  . Drug use: No  . Sexual activity: Not Currently    Birth control/protection:  Surgical  Other Topics Concern  . Not on file  Social History Narrative  . Not on file   Social Determinants of Health   Financial Resource Strain: Not on file  Food Insecurity: Not on file  Transportation Needs: Not on file  Physical Activity: Not on file  Stress: Not on file  Social Connections: Not on file  Intimate Partner Violence: Not on file    Outpatient Medications Prior to Visit  Medication Sig Dispense Refill  . acetaminophen (TYLENOL) 500 MG tablet Take 500-1,000 mg by mouth every 6 (six) hours as needed for moderate pain or headache.    . albuterol (VENTOLIN HFA) 108 (90 Base) MCG/ACT inhaler INHALE 2 PUFF USING INHALER EVERY FOUR TO SIX HOURS WHILE AWAKE AS NEEDED FOR WHEEZING, COUGH, OR SHORTNESS OF BREATH (Patient not taking: No sig reported) 18 g 1  . amLODipine (NORVASC) 10 MG tablet TAKE 1 TABLET BY MOUTH ONCE A DAY (Patient not taking: No sig reported) 90 tablet 3  . amLODipine (NORVASC) 5 MG tablet Take 1 tablet by mouth twice a day for high blood pressure 180 tablet 3  . amLODipine (NORVASC) 5 MG tablet TAKE 1 TABLET BY MOUTH ONCE A DAY (Patient taking differently: Take 5 mg by mouth daily.) 90 tablet 3  . amLODipine (NORVASC) 5 MG tablet Take 1 tablet by mouth twice a day for high blood pressure 180 tablet 3  . amoxicillin-clavulanate (AUGMENTIN) 500-125 MG tablet TAKE 1 TABLET BY MOUTH ONCE A DAY (Patient not taking: No sig reported) 7 tablet 0  . B Complex-C-Folic Acid (DIALYVITE 914) 0.8 MG TABS Take 1 tablet by mouth every evening.    . benzonatate (TESSALON) 100 MG capsule TAKE 1 CAPSULE BY MOUTH TWICE A DAY AS NEEDED FOR COUGH (Patient not taking: No sig reported) 20 capsule 1  . carvedilol (COREG) 6.25 MG tablet TAKE 1 TABLET BY MOUTH ONCE A DAY AS DIRECTED FOR HIGH BLOOD PRESSURE (Patient not taking: No sig reported) 90 tablet 3  . carvedilol (COREG) 6.25 MG tablet TAKE 1 TABLET BY MOUTH ONCE A DAY AS DIRECTED FOR HIGH BLOOD PRESSURE (Patient taking  differently: Take 6.25 mg by mouth daily.) 90 tablet 3  . ethyl chloride spray APPLY 1 APPLICATION TOPICALLY AS NEEDED (FOR DIALYSIS). 103.5 mL 0  . ethyl chloride spray SPRAY 1 AS DIRECTED TO SKIN THREE TIMES A WEEK AS DIRECTED APPLY A SMALL AMOUNT OF SPRAY TO DIALYSIS ACCESS JUST BEFORE NEEDLE STICK (Patient not taking: No sig reported) 116 mL 3  . ethyl chloride spray APPLY A SMALL AMOUNT OF SPRAY TO DIALYSIS ACCESS JUST BEFORE NEEDLE STICK THREE TIMES A WEEK AS DIRECTED (Patient not taking: No sig reported) 348 mL 3  . guaiFENesin-codeine 100-10 MG/5ML syrup Take 5 mLs by mouth 3 (three) times daily as needed for cough. (Patient not taking: No sig reported) 120 mL 0  . mirtazapine (REMERON) 15 MG tablet Take 0.5 tablets (7.5  mg total) by mouth at bedtime. (Patient not taking: No sig reported) 30 tablet 1  . mirtazapine (REMERON) 15 MG tablet TAKE 0.5 TABLETS (7.5 MG TOTAL) BY MOUTH AT BEDTIME. (Patient not taking: No sig reported) 30 tablet 1  . Omega-3 Fatty Acids (OMEGA 3 500) 500 MG CAPS Take 500 mg by mouth every other day.    Marland Kitchen omeprazole (PRILOSEC) 20 MG capsule Take 1 capsule (20 mg total) by mouth daily. 1 PO 30 mins prior to breakfast and supper (Patient taking differently: Take 20 mg by mouth daily before breakfast.) 30 capsule 3  . sertraline (ZOLOFT) 25 MG tablet TAKE 1 TABLET (25 MG TOTAL) BY MOUTH AT BEDTIME. (Patient not taking: No sig reported) 30 tablet 3  . sucroferric oxyhydroxide (VELPHORO) 500 MG chewable tablet Chew 500-1,000 mg by mouth 3 (three) times daily with meals.    Marland Kitchen zolpidem (AMBIEN) 10 MG tablet Take 1 tablet (10 mg total) by mouth at bedtime as needed for sleep. 30 tablet 0   No facility-administered medications prior to visit.    Allergies  Allergen Reactions  . Covid-19 (Mrna) Vaccine Swelling    Tongue swelling  . Remeron [Mirtazapine] Other (See Comments)    Chest pain  . Zoloft [Sertraline]     CP and chills  . Amitriptyline Other (See Comments)     Makes her shake  . Trazodone And Nefazodone Other (See Comments)    Makes her shake    ROS Review of Systems  Constitutional: Negative.   HENT: Negative.   Eyes: Negative.   Respiratory: Negative for shortness of breath.   Cardiovascular: Negative for chest pain.  Gastrointestinal: Negative.   Endocrine: Negative.   Genitourinary: Negative.   Skin: Positive for rash.  Allergic/Immunologic: Negative.   Neurological: Negative.   Hematological: Negative.   Psychiatric/Behavioral: Negative.       Objective:    Physical Exam Vitals reviewed.  Constitutional:      Appearance: Normal appearance.  HENT:     Head: Normocephalic and atraumatic.     Right Ear: External ear normal.     Left Ear: External ear normal.     Nose: Nose normal.     Mouth/Throat:     Mouth: Mucous membranes are moist.     Pharynx: Oropharynx is clear.  Eyes:     Extraocular Movements: Extraocular movements intact.     Conjunctiva/sclera: Conjunctivae normal.     Pupils: Pupils are equal, round, and reactive to light.  Cardiovascular:     Rate and Rhythm: Normal rate and regular rhythm.     Pulses: Normal pulses.     Heart sounds: Normal heart sounds.  Pulmonary:     Effort: Pulmonary effort is normal.     Breath sounds: Normal breath sounds.  Musculoskeletal:        General: Normal range of motion.     Cervical back: Normal range of motion and neck supple.     Comments: Dialysis port noted in right arm  Skin:    General: Skin is warm and dry.       Neurological:     General: No focal deficit present.     Mental Status: She is alert and oriented to person, place, and time.     BP (!) 123/58   Pulse 74   Wt 130 lb (59 kg)   LMP 02/11/2012 Comment: tubal ligation  SpO2 98%   BMI 23.78 kg/m  Wt Readings from Last 3 Encounters:  05/28/21 130  lb (59 kg)  05/09/21 130 lb (59 kg)  04/02/21 128 lb (58.1 kg)     Health Maintenance Due  Topic Date Due  . URINE MICROALBUMIN  03/25/2010   . Zoster Vaccines- Shingrix (1 of 2) Never done  . COVID-19 Vaccine (2 - Pfizer 3-dose series) 04/02/2020  . MAMMOGRAM  06/28/2020    There are no preventive care reminders to display for this patient.  Lab Results  Component Value Date   TSH 1.130 03/12/2021   Lab Results  Component Value Date   WBC 5.1 03/12/2021   HGB 10.1 (L) 03/12/2021   HCT 30.6 (L) 03/12/2021   MCV 96 03/12/2021   PLT 232 03/12/2021   Lab Results  Component Value Date   NA 136 02/22/2021   K 2.8 (L) 02/22/2021   CO2 28 02/22/2021   GLUCOSE 97 02/22/2021   BUN 17 02/22/2021   CREATININE 4.04 (H) 02/22/2021   BILITOT 0.6 09/26/2019   ALKPHOS 88 09/26/2019   AST 39 09/26/2019   ALT 33 09/26/2019   PROT 6.9 09/26/2019   ALBUMIN 3.4 (L) 09/27/2019   CALCIUM 9.8 02/22/2021   ANIONGAP 14 02/22/2021   Lab Results  Component Value Date   CHOL 195 03/12/2021   Lab Results  Component Value Date   HDL 65 03/12/2021   Lab Results  Component Value Date   LDLCALC 111 (H) 03/12/2021   Lab Results  Component Value Date   TRIG 109 03/12/2021   Lab Results  Component Value Date   CHOLHDL 3.0 03/12/2021   Lab Results  Component Value Date   HGBA1C 4.8 03/22/2017      Assessment & Plan:   Problem List Items Addressed This Visit      Genitourinary   End stage renal disease on dialysis ([place]) (Chronic)     Other   Psychophysiological insomnia    Other Visit Diagnoses    Dermatitis    -  Primary   Relevant Medications   nystatin-triamcinolone ointment (MYCOLOG)      Meds ordered this encounter  Medications  . nystatin-triamcinolone ointment (MYCOLOG)    Sig: Apply 1 application topically 2 (two) times daily.    Dispense:  30 g    Refill:  0    Order Specific Question:   Supervising Provider    Answer:   WRIGHT, PATRICK E [[id number]]  1. Dermatitis Trial Mycolog, patient education given on supportive care.  Red flags given for prompt reevaluation - nystatin-triamcinolone ointment  (MYCOLOG); Apply 1 application topically 2 (two) times daily.  Dispense: 30 g; Refill: 0  2. Psychophysiological insomnia Reduce Ambien 5 mg, no refill needed at this time  3. End stage renal disease on dialysis [facility])    I have reviewed the patient's medical history (PMH, PSH, Social History, Family History, Medications, and allergies) , and have been updated if relevant. I spent 32 minutes reviewing chart and  face to face time with patient.      Follow-up: Return if symptoms worsen or fail to improve.    Loraine Grip Mayers, PA-C

## 2021-05-29 ENCOUNTER — Other Ambulatory Visit: Payer: Self-pay

## 2021-05-29 MED FILL — Carvedilol Tab 6.25 MG: ORAL | 30 days supply | Qty: 30 | Fill #3 | Status: AC

## 2021-05-29 MED FILL — Ethyl Chloride Aerosol Spray: CUTANEOUS | 30 days supply | Qty: 116 | Fill #0 | Status: AC

## 2021-05-30 ENCOUNTER — Other Ambulatory Visit: Payer: Self-pay

## 2021-06-06 ENCOUNTER — Other Ambulatory Visit: Payer: Self-pay

## 2021-06-06 ENCOUNTER — Ambulatory Visit: Payer: Self-pay | Attending: Internal Medicine | Admitting: Internal Medicine

## 2021-06-06 ENCOUNTER — Encounter: Payer: Self-pay | Admitting: Internal Medicine

## 2021-06-06 VITALS — BP 130/76 | HR 60 | Resp 16 | Wt 123.2 lb

## 2021-06-06 DIAGNOSIS — Z1231 Encounter for screening mammogram for malignant neoplasm of breast: Secondary | ICD-10-CM

## 2021-06-06 DIAGNOSIS — I129 Hypertensive chronic kidney disease with stage 1 through stage 4 chronic kidney disease, or unspecified chronic kidney disease: Secondary | ICD-10-CM

## 2021-06-06 DIAGNOSIS — Z23 Encounter for immunization: Secondary | ICD-10-CM

## 2021-06-06 DIAGNOSIS — N186 End stage renal disease: Secondary | ICD-10-CM

## 2021-06-06 DIAGNOSIS — Z992 Dependence on renal dialysis: Secondary | ICD-10-CM

## 2021-06-06 DIAGNOSIS — G4700 Insomnia, unspecified: Secondary | ICD-10-CM

## 2021-06-06 NOTE — Progress Notes (Signed)
Patient ID: Sabrina Mitchell, female    DOB: May 11, 1961  MRN: [id number]  CC: No chief complaint on file.   Subjective: Sabrina Mitchell is a 60 y.o. female who presents for chronic ds management Her concerns today include:  60 year old with ESRD on HD, HTN, ACD, GERD, vit D def and insomnia.  Insomnia:  saw PA Cari Mayers everal times since last visit with me.  Started her on Ambien for insomnia because other meds - Trazodone, Elavil and Remeron did not help.  Dose was increased to 10 mg.  However she had an episode of sleepwalking on the higher dose so she was advised to take half a tablet daily which she has been doing and is tolerating okay.  HTN/ESRD:  Compliant with Norvasc and Coreg and salt restriction No CP/SOB/LE Compliant with going to hemodialysis Tuesday Thursdays and Saturdays.  HM:  referred for [facility] 10/2020.  She has not been able to get this done.  Her OC expired yesterday.  Due for shingles and pneumonia vaccine.   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     albuterol (VENTOLIN HFA) 108 (90 Base)  MCG/ACT inhaler INHALE 2 PUFF USING INHALER EVERY FOUR TO SIX HOURS WHILE AWAKE AS NEEDED FOR WHEEZING, COUGH, OR SHORTNESS OF BREATH (Patient not taking: No sig reported) 18 g 1   amLODipine (NORVASC) 5 MG tablet Take 1 tablet by mouth twice a day for high blood pressure 180 tablet 3   B Complex-C-Folic Acid (DIALYVITE 938) 0.8 MG TABS Take 1 tablet by mouth every evening.     carvedilol (COREG) 6.25 MG tablet TAKE 1 TABLET BY MOUTH ONCE A DAY AS DIRECTED FOR HIGH BLOOD PRESSURE (Patient taking differently: Take 6.25 mg by mouth daily.) 90 tablet 3   ethyl chloride spray APPLY 1 APPLICATION TOPICALLY AS NEEDED (FOR DIALYSIS). 103.5 mL 0   ethyl chloride spray APPLY A SMALL AMOUNT OF SPRAY TO DIALYSIS ACCESS JUST BEFORE NEEDLE STICK THREE TIMES A WEEK AS DIRECTED (Patient not taking: No sig reported) 348 mL 3   nystatin-triamcinolone ointment (MYCOLOG) Apply 1 application topically 2 (two) times daily. 30 g 0   Omega-3 Fatty Acids (OMEGA 3 500) 500 MG CAPS Take 500 mg by mouth every other day.     omeprazole (PRILOSEC) 20 MG capsule Take 1 capsule (20 mg total) by mouth daily. 1 PO 30 mins prior to breakfast and supper (Patient taking differently: Take 20 mg by mouth daily before breakfast.) 30 capsule 3   sucroferric oxyhydroxide (VELPHORO) 500 MG chewable tablet Chew 500-1,000 mg by mouth 3 (three) times daily with meals.     zolpidem (AMBIEN) 10 MG tablet  Take 1 tablet (10 mg total) by mouth at bedtime as needed for sleep. 30 tablet 0   No current facility-administered medications on file prior to visit.    Allergies  Allergen Reactions   Covid-19 (Mrna) Vaccine Swelling    Tongue swelling   Remeron [Mirtazapine] Other (See Comments)    Chest pain   Zoloft [Sertraline]     CP and chills   Amitriptyline Other (See Comments)    Makes her shake   Trazodone And Nefazodone Other (See Comments)    Makes her shake    Social History   Socioeconomic History   Marital status: Significant  Other    Spouse name: Not on file   Number of children: 2   Years of education: Not on file   Highest education level: Not on file  Occupational History   Not on file  Tobacco Use   Smoking status: Never   Smokeless tobacco: Never  Vaping Use   Vaping Use: Never used  Substance and Sexual Activity   Alcohol use: No   Drug use: No   Sexual activity: Not Currently    Birth control/protection: Surgical  Other Topics Concern   Not on file  Social History Narrative   Not on file   Social Determinants of Health   Financial Resource Strain: Not on file  Food Insecurity: Not on file  Transportation Needs: Not on file  Physical Activity: Not on file  Stress: Not on file  Social Connections: Not on file  Intimate Partner Violence: Not on file    Family History  Problem Relation Age of Onset   Hypertension Mother    Hypertension Father    Hypertension Sister    Breast cancer Sister 15   Diabetes Brother    Hypertension Brother     Past Surgical History:  Procedure Laterality Date   AV FISTULA PLACEMENT Left 07/05/09   Dr. Kellie Simmering   COLONOSCOPY N/A 02/10/2016   SLF: 1. HEME postive stool due to colon polyps intrernal hemorrhoids 2. small internal hemrrohoids 3. moderate external hemorrhoids.    ESOPHAGEAL DILATION N/A 02/10/2016   Procedure: ESOPHAGEAL DILATION;  Surgeon: Danie Binder, MD;  [facility];  Service: Endoscopy;  Laterality: N/A;   ESOPHAGOGASTRODUODENOSCOPY N/A 02/10/2016   SLF: patent Schatzki's ring , mild gastritis/ duodentitis.    FISTULA SUPERFICIALIZATION Left 08/30/2670   Procedure: PLICATION BRACHIOCEPHALIC FISTULA;  Surgeon: Angelia Mould, MD;  Location: [place];  Service: Vascular;  Laterality: Left;   FISTULOGRAM N/A 10/22/2014   Procedure: FISTULOGRAM;  Surgeon: Angelia Mould, MD;  Location: [facility] CATH LAB;  Service: Cardiovascular;  Laterality: N/A;   IR DIALY SHUNT INTRO NEEDLE/INTRACATH INITIAL W/IMG LEFT Left 02/01/2019   IR  DIALY SHUNT INTRO NEEDLE/INTRACATH INITIAL W/IMG LEFT Left 06/05/2020   IR DIALY SHUNT INTRO NEEDLE/INTRACATH INITIAL W/IMG LEFT Left 05/09/2021   PERIPHERAL VASCULAR CATHETERIZATION Left 12/09/2016   Procedure: A/V Fistulagram;  Surgeon: Waynetta Sandy, MD;  Location: [place] CV LAB;  Service: Cardiovascular;  Laterality: Left;   REVISON OF ARTERIOVENOUS FISTULA Left 24/58/0998   Procedure: PLICATION OF BRACHIOCEPHALIC FISTULA;  Surgeon: Conrad [place], MD;  Location: [place];  Service: Vascular;  Laterality: Left;   REVISON OF ARTERIOVENOUS FISTULA Left 08/09/2017   Procedure: REVISION OF ARTERIOVENOUS FISTULA LIGATION OF SIDE BRANCH;  Surgeon: Waynetta Sandy, MD;  Location: [place];  Service: Vascular;  Laterality: Left;   TUBAL LIGATION  2004    ROS: Review of Systems  Negative except as stated above  PHYSICAL EXAM: BP 130/76   Pulse 60   Resp 16   Wt 123 lb 3.2 oz (55.9 kg)   LMP 02/11/2012 Comment: tubal ligation  SpO2 98%   BMI 22.53 kg/m   Physical Exam  General appearance - alert, well appearing, and in no distress Mental status - normal mood, behavior, speech, dress, motor activity, and thought processes Chest - clear to auscultation, no wheezes, rales or rhonchi, symmetric air entry Heart - normal rate, regular rhythm, normal S1, S2, no murmurs, rubs, clicks or gallops   CMP Latest Ref Rng & Units 02/22/2021 03/27/2020 03/12/2020  Glucose 70 - 99 mg/dL 97 77 88  BUN 6 - 20 mg/dL 17 46(H) 26(H)  Creatinine 0.44 - 1.00 mg/dL 4.04(H) 7.20(H) 4.83(H)  Sodium 135 - 145 mmol/L 136 140 134(L)  Potassium 3.5 - 5.1 mmol/L 2.8(L) 4.0 3.5  Chloride 98 - 111 mmol/L 94(L) 94(L) 92(L)  CO2 22 - 32 mmol/L 28 26 29   Calcium 8.9 - 10.3 mg/dL 9.8 9.8 9.2  Total Protein 6.5 - 8.1 g/dL - - -  Total Bilirubin 0.3 - 1.2 mg/dL - - -  Alkaline Phos 38 - 126 U/L - - -  AST 15 - 41 U/L - - -  ALT 0 - 44 U/L - - -   Lipid Panel     Component Value Date/Time   CHOL 195  03/12/2021 0954   TRIG 109 03/12/2021 0954   HDL 65 03/12/2021 0954   CHOLHDL 3.0 03/12/2021 0954   CHOLHDL 2.8 Ratio 10/22/2010 2104   VLDL 30 10/22/2010 2104   LDLCALC 111 (H) 03/12/2021 0954    CBC    Component Value Date/Time   WBC 5.1 03/12/2021 0954   WBC 6.8 02/22/2021 1817   RBC 3.18 (L) 03/12/2021 0954   RBC 3.76 (L) 02/22/2021 1817   HGB 10.1 (L) 03/12/2021 0954   HCT 30.6 (L) 03/12/2021 0954   PLT 232 03/12/2021 0954   MCV 96 03/12/2021 0954   MCH 31.8 03/12/2021 0954   MCH 31.4 02/22/2021 1817   MCHC 33.0 03/12/2021 0954   MCHC 32.7 02/22/2021 1817   RDW 14.3 03/12/2021 0954   LYMPHSABS 1.1 03/12/2021 0954   MONOABS 0.4 09/26/2019 0702   EOSABS 0.1 03/12/2021 0954   BASOSABS 0.0 03/12/2021 0954    ASSESSMENT AND PLAN:  1. Renal hypertension At goal.  Continue amlodipine and carvedilol  2. ESRD on hemodialysis ([place]) 3. Insomnia, unspecified type I agree with decreasing the dose of Ambien to 5 mg  4. Encounter for screening mammogram for malignant neoplasm of breast - MM Digital Screening; Future  5. Need for vaccination against Streptococcus pneumoniae - Pneumococcal conjugate vaccine 20-valent (Prevnar 20)   Patient was given the opportunity to ask questions.  Patient verbalized understanding of the plan and was able to repeat key elements of the plan.  [employer] Language interpreter used during this encounter. # Joelene Millin [phone]  Orders Placed This Encounter  Procedures   MM Digital Screening   Pneumococcal conjugate vaccine 20-valent (Prevnar 20)     Requested Prescriptions    No prescriptions requested or ordered in this encounter    Return in about 4 months (around 10/06/2021) for f/u with me in 4 mths for physical.  Give appt with Lurena Joiner in 2 wks for shingles vaccine.  Karle Plumber, MD, FACP

## 2021-06-20 ENCOUNTER — Ambulatory Visit: Payer: Self-pay | Attending: Internal Medicine | Admitting: Pharmacist

## 2021-06-20 ENCOUNTER — Other Ambulatory Visit: Payer: Self-pay

## 2021-06-20 ENCOUNTER — Other Ambulatory Visit: Payer: Self-pay | Admitting: Internal Medicine

## 2021-06-20 DIAGNOSIS — E559 Vitamin D deficiency, unspecified: Secondary | ICD-10-CM

## 2021-06-20 DIAGNOSIS — Z23 Encounter for immunization: Secondary | ICD-10-CM

## 2021-06-20 NOTE — Progress Notes (Signed)
Patient presents for vaccination against zoster per orders of Dr. Johnson. Consent given. Counseling provided. No contraindications exists. Vaccine administered without incident.   Luke Van Ausdall, PharmD, BCACP, CPP Clinical Pharmacist [facility] [phone]  

## 2021-06-21 ENCOUNTER — Other Ambulatory Visit: Payer: Self-pay | Admitting: Internal Medicine

## 2021-06-21 LAB — VITAMIN D 25 HYDROXY (VIT D DEFICIENCY, FRACTURES): Vit D, 25-Hydroxy: 15.3 ng/mL — ABNORMAL LOW (ref 30.0–100.0)

## 2021-06-21 MED ORDER — VITAMIN D (ERGOCALCIFEROL) 1.25 MG (50000 UNIT) PO CAPS
50000.0000 [IU] | ORAL_CAPSULE | ORAL | 1 refills | Status: DC
  Filled 2021-06-21: qty 12, 84d supply, fill #0

## 2021-06-23 ENCOUNTER — Other Ambulatory Visit: Payer: Self-pay

## 2021-06-26 ENCOUNTER — Other Ambulatory Visit: Payer: Self-pay

## 2021-06-26 MED FILL — Carvedilol Tab 6.25 MG: ORAL | 30 days supply | Qty: 30 | Fill #0 | Status: AC

## 2021-07-02 ENCOUNTER — Other Ambulatory Visit: Payer: Self-pay

## 2021-07-02 ENCOUNTER — Telehealth: Payer: Self-pay | Admitting: Internal Medicine

## 2021-07-02 ENCOUNTER — Ambulatory Visit: Payer: Self-pay | Attending: Internal Medicine

## 2021-07-02 DIAGNOSIS — F5104 Psychophysiologic insomnia: Secondary | ICD-10-CM

## 2021-07-02 MED ORDER — ZOLPIDEM TARTRATE 5 MG PO TABS: 5.0000 mg | ORAL_TABLET | Freq: Every evening | ORAL | 1 refills | Status: DC | PRN

## 2021-07-02 MED FILL — Ethyl Chloride Aerosol Spray: CUTANEOUS | 30 days supply | Qty: 116 | Fill #2 | Status: AC

## 2021-07-02 NOTE — Telephone Encounter (Signed)
Pt requesting Refill for   zolpidem (AMBIEN) 10 MG tablet [[id number]]  And asked if it could be sent to  [place]  Medication given by Victoriano Lain but bus is not operating this week. Pt states this has helped tremendously with sleeping. Thank you

## 2021-07-02 NOTE — Telephone Encounter (Signed)
Will forward to provider  

## 2021-07-02 NOTE — Telephone Encounter (Signed)
Please make pt aware.

## 2021-07-03 ENCOUNTER — Ambulatory Visit
Admission: RE | Admit: 2021-07-03 | Discharge: 2021-07-03 | Disposition: A | Discharge status: Home / Self Care | Payer: No Typology Code available for payment source | Source: Ambulatory Visit | Attending: Internal Medicine | Admitting: Internal Medicine

## 2021-07-03 DIAGNOSIS — Z1231 Encounter for screening mammogram for malignant neoplasm of breast: Secondary | ICD-10-CM

## 2021-07-09 ENCOUNTER — Other Ambulatory Visit: Payer: Self-pay

## 2021-07-09 ENCOUNTER — Ambulatory Visit: Payer: Self-pay | Admitting: Physician Assistant

## 2021-07-09 ENCOUNTER — Telehealth: Payer: Self-pay | Admitting: *Deleted

## 2021-07-09 ENCOUNTER — Encounter: Payer: Self-pay | Admitting: Physician Assistant

## 2021-07-09 VITALS — BP 166/77 | HR 77 | Temp 98.2°F | Resp 18 | Ht 59.0 in | Wt 121.0 lb

## 2021-07-09 DIAGNOSIS — I770 Arteriovenous fistula, acquired: Secondary | ICD-10-CM

## 2021-07-09 DIAGNOSIS — N186 End stage renal disease: Secondary | ICD-10-CM

## 2021-07-09 DIAGNOSIS — Z992 Dependence on renal dialysis: Secondary | ICD-10-CM

## 2021-07-09 NOTE — Telephone Encounter (Signed)
Received referral from [facility]. Pt is having pain in arm and is unable to run well at dialyisis. Will schedule for fistulogram

## 2021-07-09 NOTE — Progress Notes (Signed)
Established Patient Office Visit  Subjective:  Patient ID: Sabrina Mitchell, female    DOB: [birth year]  Age: 60 y.o. MRN: [id number]  CC:  Chief Complaint  Patient presents with   Bleeding/Bruising    Right Arm Fistula    HPI Sabrina Mitchell reports that she has been in excruciating pain when she is having her dialysis.  Reports that she was told at dialysis that her port is not filtering properly.  She has previously been seen at vascular surgery, last visit was in November 2019   END-STAGE RENAL DISEASE: This patient has an aneurysmal left upper arm fistula which she has had for 8 or 9 years.  The more central portion is especially aneurysmal and she has 2 skin erosions over this area.  I have recommended plication of the aneurysm in this location to address the skin erosions and I think there is still room to cannulate the fistula above and below this area.  We have discussed the procedure and potential risks.  We discussed the alternative of ligating her fistula and placing new access with a catheter however I have encouraged her not to do this so that we can get as much time as possible from her current AV fistula.  She is agreeable with this plan.  Her surgeries been scheduled for Friday, 11/04/2018.    Due to language barrier, an interpreter was present during the history-taking and subsequent discussion (and for part of the physical exam) with this patient.   Past Medical History:  Diagnosis Date   Anemia    Anxiety    Complication of anesthesia    Depression    denies   End stage renal disease ([place])    hemodialysis 3x/wk @ [place]   Family history of adverse reaction to anesthesia    sister vomiting after uterine cyst removal   Gastritis    GERD (gastroesophageal reflux disease)    Hemorrhoids    Hypertension    Pneumonia    years ago   PONV (postoperative nausea and vomiting)    UTI (lower urinary tract infection) March 2014    Past  Surgical History:  Procedure Laterality Date   AV FISTULA PLACEMENT Left 07/05/09   Dr. Kellie Simmering   COLONOSCOPY N/A 02/10/2016   SLF: 1. HEME postive stool due to colon polyps intrernal hemorrhoids 2. small internal hemrrohoids 3. moderate external hemorrhoids.    ESOPHAGEAL DILATION N/A 02/10/2016   Procedure: ESOPHAGEAL DILATION;  Surgeon: Danie Binder, MD;  [facility];  Service: Endoscopy;  Laterality: N/A;   ESOPHAGOGASTRODUODENOSCOPY N/A 02/10/2016   SLF: patent Schatzki's ring , mild gastritis/ duodentitis.    FISTULA SUPERFICIALIZATION Left 40/08/8118   Procedure: PLICATION BRACHIOCEPHALIC FISTULA;  Surgeon: Angelia Mould, MD;  Location: [place];  Service: Vascular;  Laterality: Left;   FISTULOGRAM N/A 10/22/2014   Procedure: FISTULOGRAM;  Surgeon: Angelia Mould, MD;  Location: [facility] CATH LAB;  Service: Cardiovascular;  Laterality: N/A;   IR DIALY SHUNT INTRO NEEDLE/INTRACATH INITIAL W/IMG LEFT Left 02/01/2019   IR DIALY SHUNT INTRO NEEDLE/INTRACATH INITIAL W/IMG LEFT Left 06/05/2020   IR DIALY SHUNT INTRO NEEDLE/INTRACATH INITIAL W/IMG LEFT Left 05/09/2021   PERIPHERAL VASCULAR CATHETERIZATION Left 12/09/2016   Procedure: A/V Fistulagram;  Surgeon: Waynetta Sandy, MD;  Location: [place] CV LAB;  Service: Cardiovascular;  Laterality: Left;   REVISON OF ARTERIOVENOUS FISTULA Left 14/78/2956   Procedure: PLICATION OF BRACHIOCEPHALIC FISTULA;  Surgeon: Conrad Whiting, MD;  [facility];  Service: Vascular;  Laterality: Left;   REVISON OF ARTERIOVENOUS FISTULA Left 08/09/2017   Procedure: REVISION OF ARTERIOVENOUS FISTULA LIGATION OF SIDE BRANCH;  Surgeon: Waynetta Sandy, MD;  Location: [place];  Service: Vascular;  Laterality: Left;   TUBAL LIGATION  2004    Family History  Problem Relation Age of Onset   Hypertension Mother    Hypertension Father    Hypertension Sister    Breast cancer Sister 17   Diabetes Brother    Hypertension Brother      Social History   Socioeconomic History   Marital status: Significant Other    Spouse name: Not on file   Number of children: 2   Years of education: Not on file   Highest education level: Not on file  Occupational History   Not on file  Tobacco Use   Smoking status: Never   Smokeless tobacco: Never  Vaping Use   Vaping Use: Never used  Substance and Sexual Activity   Alcohol use: No   Drug use: No   Sexual activity: Not Currently    Birth control/protection: Surgical  Other Topics Concern   Not on file  Social History Narrative   Not on file   Social Determinants of Health   Financial Resource Strain: Not on file  Food Insecurity: Not on file  Transportation Needs: Not on file  Physical Activity: Not on file  Stress: Not on file  Social Connections: Not on file  Intimate Partner Violence: Not on file    Outpatient Medications Prior to Visit  Medication Sig Dispense Refill   acetaminophen (TYLENOL) 500 MG tablet Take 500-1,000 mg by mouth every 6 (six) hours as needed for moderate pain or headache.     amLODipine (NORVASC) 5 MG tablet Take 1 tablet by mouth twice a day for high blood pressure 180 tablet 3   B Complex-C-Folic Acid (DIALYVITE 932) 0.8 MG TABS Take 1 tablet by mouth every evening.     carvedilol (COREG) 6.25 MG tablet TAKE 1 TABLET BY MOUTH ONCE A DAY AS DIRECTED FOR HIGH BLOOD PRESSURE (Patient taking differently: Take 6.25 mg by mouth daily.) 90 tablet 3   ethyl chloride spray APPLY A SMALL AMOUNT OF SPRAY TO DIALYSIS ACCESS JUST BEFORE NEEDLE STICK THREE TIMES A WEEK AS DIRECTED 348 mL 3   nystatin-triamcinolone ointment (MYCOLOG) Apply 1 application topically 2 (two) times daily. 30 g 0   Omega-3 Fatty Acids (OMEGA 3 500) 500 MG CAPS Take 500 mg by mouth every other day.     omeprazole (PRILOSEC) 20 MG capsule Take 1 capsule (20 mg total) by mouth daily. 1 PO 30 mins prior to breakfast and supper (Patient taking differently: Take 20 mg by mouth  daily before breakfast.) 30 capsule 3   sucroferric oxyhydroxide (VELPHORO) 500 MG chewable tablet Chew 500-1,000 mg by mouth 3 (three) times daily with meals.     Vitamin D, Ergocalciferol, (DRISDOL) 1.25 MG (50000 UNIT) CAPS capsule Take 1 capsule (50,000 Units total) by mouth every 7 (seven) days. 12 capsule 1   zolpidem (AMBIEN) 5 MG tablet Take 1 tablet (5 mg total) by mouth at bedtime as needed for sleep. 30 tablet 1   albuterol (VENTOLIN HFA) 108 (90 Base) MCG/ACT inhaler INHALE 2 PUFF USING INHALER EVERY FOUR TO SIX HOURS WHILE AWAKE AS NEEDED FOR WHEEZING, COUGH, OR SHORTNESS OF BREATH (Patient not taking: No sig reported) 18 g 1   ethyl chloride spray APPLY 1 APPLICATION TOPICALLY  AS NEEDED (FOR DIALYSIS). 103.5 mL 0   No facility-administered medications prior to visit.    Allergies  Allergen Reactions   Covid-19 (Mrna) Vaccine Swelling    Tongue swelling   Remeron [Mirtazapine] Other (See Comments)    Chest pain   Zoloft [Sertraline]     CP and chills   Amitriptyline Other (See Comments)    Makes her shake   Trazodone And Nefazodone Other (See Comments)    Makes her shake    ROS Review of Systems  Constitutional: Negative.   HENT: Negative.    Eyes: Negative.   Respiratory:  Negative for shortness of breath.   Cardiovascular:  Negative for chest pain.  Gastrointestinal: Negative.   Endocrine: Negative.   Genitourinary: Negative.   Musculoskeletal:  Positive for myalgias.  Skin: Negative.   Allergic/Immunologic: Negative.   Neurological: Negative.   Hematological: Negative.   Psychiatric/Behavioral: Negative.       Objective:    Physical Exam Vitals and nursing note reviewed.  Constitutional:      Appearance: Normal appearance.  HENT:     Head: Normocephalic and atraumatic.     Right Ear: External ear normal.     Left Ear: External ear normal.     Nose: Nose normal.     Mouth/Throat:     Mouth: Mucous membranes are moist.     Pharynx: Oropharynx is  clear.  Eyes:     Extraocular Movements: Extraocular movements intact.     Conjunctiva/sclera: Conjunctivae normal.     Pupils: Pupils are equal, round, and reactive to light.  Cardiovascular:     Rate and Rhythm: Normal rate and regular rhythm.     Pulses: Normal pulses.     Heart sounds: Normal heart sounds.  Pulmonary:     Effort: Pulmonary effort is normal.     Breath sounds: Normal breath sounds.  Musculoskeletal:        General: Normal range of motion.     Cervical back: Normal range of motion and neck supple.  Skin:    General: Skin is warm.     Comments: Bandages; large fistula noted left upper arm  Neurological:     General: No focal deficit present.     Mental Status: She is alert and oriented to person, place, and time.  Psychiatric:        Mood and Affect: Mood normal.        Behavior: Behavior normal.        Thought Content: Thought content normal.        Judgment: Judgment normal.    BP (!) 166/77 (BP Location: Left Arm, Patient Position: Sitting, Cuff Size: Large)   Pulse 77   Temp 98.2 F (36.8 C)   Resp 18   Ht 4\' 11"  (1.499 m)   Wt 121 lb (54.9 kg)   LMP 02/11/2012 Comment: tubal ligation  SpO2 98%   BMI 24.44 kg/m  Wt Readings from Last 3 Encounters:  07/09/21 121 lb (54.9 kg)  06/06/21 123 lb 3.2 oz (55.9 kg)  05/28/21 130 lb (59 kg)     Health Maintenance Due  Topic Date Due   URINE MICROALBUMIN  03/25/2010   COVID-19 Vaccine (2 - Pfizer series) 04/02/2020   MAMMOGRAM  06/28/2020    There are no preventive care reminders to display for this patient.  Lab Results  Component Value Date   TSH 1.130 03/12/2021   Lab Results  Component Value Date   WBC 5.1 03/12/2021  HGB 10.1 (L) 03/12/2021   HCT 30.6 (L) 03/12/2021   MCV 96 03/12/2021   PLT 232 03/12/2021   Lab Results  Component Value Date   NA 136 02/22/2021   K 2.8 (L) 02/22/2021   CO2 28 02/22/2021   GLUCOSE 97 02/22/2021   BUN 17 02/22/2021   CREATININE 4.04 (H)  02/22/2021   BILITOT 0.6 09/26/2019   ALKPHOS 88 09/26/2019   AST 39 09/26/2019   ALT 33 09/26/2019   PROT 6.9 09/26/2019   ALBUMIN 3.4 (L) 09/27/2019   CALCIUM 9.8 02/22/2021   ANIONGAP 14 02/22/2021   Lab Results  Component Value Date   CHOL 195 03/12/2021   Lab Results  Component Value Date   HDL 65 03/12/2021   Lab Results  Component Value Date   LDLCALC 111 (H) 03/12/2021   Lab Results  Component Value Date   TRIG 109 03/12/2021   Lab Results  Component Value Date   CHOLHDL 3.0 03/12/2021   Lab Results  Component Value Date   HGBA1C 4.8 03/22/2017      Assessment & Plan:   Problem List Items Addressed This Visit       Genitourinary   End stage renal disease on dialysis ([place]) - Primary (Chronic)   Relevant Orders   Ambulatory referral to Vascular Surgery   Other Visit Diagnoses     AV fistula ([place])       Relevant Orders   Ambulatory referral to Vascular Surgery       No orders of the defined types were placed in this encounter. 1. End stage renal disease on dialysis [facility])  - Ambulatory referral to Vascular Surgery  2. AV fistula ([place]) Urgent referral to vein and vascular for further evaluation.  Patient understands and agrees.  Red flags given for prompt reevaluation - Ambulatory referral to Vascular Surgery   I have reviewed the patient's medical history (PMH, PSH, Social History, Family History, Medications, and allergies) , and have been updated if relevant. I spent 27 minutes reviewing chart and  face to face time with patient.    Follow-up: Return if symptoms worsen or fail to improve.    Loraine Grip Mayers, PA-C

## 2021-07-09 NOTE — Telephone Encounter (Signed)
LVM to return call to schedule fistulogram via language interpreter [place], [place].

## 2021-07-09 NOTE — Patient Instructions (Signed)
I have started an urgent referral for you to be seen by vascular surgery for further evaluation of your fistula.  Please let us know if you do not hear from them in the next few days.  Please let us know if there is anything else we can do for you.  Kennieth Rad, PA-C Physician Assistant [facility] Medicine [email or web address]

## 2021-07-09 NOTE — Progress Notes (Signed)
Patient took medication around 8am and has eaten today. Patient reports pain at the fistula site in her left arm for the past 3 weeks. Patient had dialysis this morning and reports the needle stick is excruciating when being placed.

## 2021-07-11 ENCOUNTER — Other Ambulatory Visit: Payer: Self-pay

## 2021-07-11 NOTE — Telephone Encounter (Signed)
Patient's daughter Sabrina Mitchell returned call. Wants to get pts left arm fistulogram scheduled ASAP. Scheduled pt for 07/14/21 with Dr. Donzetta Matters. Voiced understanding.

## 2021-07-14 ENCOUNTER — Other Ambulatory Visit: Payer: Self-pay

## 2021-07-14 ENCOUNTER — Encounter (HOSPITAL_COMMUNITY)
Admission: RE | Disposition: A | Discharge status: Home / Self Care | Payer: Self-pay | Source: Home / Self Care | Attending: Vascular Surgery

## 2021-07-14 ENCOUNTER — Ambulatory Visit (HOSPITAL_COMMUNITY)
Admission: RE | Admit: 2021-07-14 | Discharge: 2021-07-14 | Disposition: A | Discharge status: Home / Self Care | Payer: No Typology Code available for payment source | Attending: Vascular Surgery | Admitting: Vascular Surgery

## 2021-07-14 DIAGNOSIS — Z833 Family history of diabetes mellitus: Secondary | ICD-10-CM | POA: Insufficient documentation

## 2021-07-14 DIAGNOSIS — I12 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 disease: Secondary | ICD-10-CM | POA: Insufficient documentation

## 2021-07-14 DIAGNOSIS — Z992 Dependence on renal dialysis: Secondary | ICD-10-CM | POA: Insufficient documentation

## 2021-07-14 DIAGNOSIS — Y832 Surgical operation with anastomosis, bypass or graft as the cause of abnormal reaction of the patient, or of later complication, without mention of misadventure at the time of the procedure: Secondary | ICD-10-CM

## 2021-07-14 DIAGNOSIS — T82858A Stenosis of vascular prosthetic devices, implants and grafts, initial encounter: Secondary | ICD-10-CM | POA: Insufficient documentation

## 2021-07-14 DIAGNOSIS — Z888 Allergy status to other drugs, medicaments and biological substances status: Secondary | ICD-10-CM | POA: Insufficient documentation

## 2021-07-14 DIAGNOSIS — N186 End stage renal disease: Secondary | ICD-10-CM

## 2021-07-14 DIAGNOSIS — Z79899 Other long term (current) drug therapy: Secondary | ICD-10-CM | POA: Insufficient documentation

## 2021-07-14 DIAGNOSIS — Z887 Allergy status to serum and vaccine status: Secondary | ICD-10-CM | POA: Insufficient documentation

## 2021-07-14 DIAGNOSIS — Z8249 Family history of ischemic heart disease and other diseases of the circulatory system: Secondary | ICD-10-CM | POA: Insufficient documentation

## 2021-07-14 HISTORY — PX: PERIPHERAL VASCULAR BALLOON ANGIOPLASTY: CATH118281

## 2021-07-14 LAB — POCT I-STAT, CHEM 8
BUN: 63 mg/dL — ABNORMAL HIGH (ref 6–20)
Calcium, Ion: 1.17 mmol/L (ref 1.15–1.40)
Chloride: 99 mmol/L (ref 98–111)
Creatinine, Ser: 9.6 mg/dL — ABNORMAL HIGH (ref 0.44–1.00)
Glucose, Bld: 84 mg/dL (ref 70–99)
HCT: 28 % — ABNORMAL LOW (ref 36.0–46.0)
Hemoglobin: 9.5 g/dL — ABNORMAL LOW (ref 12.0–15.0)
Potassium: 4 mmol/L (ref 3.5–5.1)
Sodium: 138 mmol/L (ref 135–145)
TCO2: 28 mmol/L (ref 22–32)

## 2021-07-14 SURGERY — PERIPHERAL VASCULAR BALLOON ANGIOPLASTY
Anesthesia: LOCAL | Laterality: Left

## 2021-07-14 MED ORDER — LIDOCAINE HCL (PF) 1 % IJ SOLN
INTRAMUSCULAR | Status: DC | PRN
  Administered 2021-07-14: 10 mL via INTRADERMAL

## 2021-07-14 MED ORDER — FENTANYL CITRATE (PF) 100 MCG/2ML IJ SOLN
INTRAMUSCULAR | Status: AC
  Filled 2021-07-14: qty 2

## 2021-07-14 MED ORDER — SODIUM CHLORIDE 0.9% FLUSH: 3.0000 mL | INTRAVENOUS | Status: DC | PRN

## 2021-07-14 MED ORDER — MIDAZOLAM HCL 2 MG/2ML IJ SOLN
INTRAMUSCULAR | Status: DC | PRN
  Administered 2021-07-14 (×2): 1 mg via INTRAVENOUS

## 2021-07-14 MED ORDER — HEPARIN (PORCINE) IN NACL 1000-0.9 UT/500ML-% IV SOLN
INTRAVENOUS | Status: DC | PRN
  Administered 2021-07-14: 500 mL

## 2021-07-14 MED ORDER — HEPARIN (PORCINE) IN NACL 1000-0.9 UT/500ML-% IV SOLN
INTRAVENOUS | Status: AC
  Filled 2021-07-14: qty 500

## 2021-07-14 MED ORDER — IODIXANOL 320 MG/ML IV SOLN
INTRAVENOUS | Status: DC | PRN
  Administered 2021-07-14: 30 mL via INTRAVENOUS

## 2021-07-14 MED ORDER — MIDAZOLAM HCL 2 MG/2ML IJ SOLN
INTRAMUSCULAR | Status: AC
  Filled 2021-07-14: qty 2

## 2021-07-14 MED ORDER — LIDOCAINE HCL (PF) 1 % IJ SOLN
INTRAMUSCULAR | Status: AC
  Filled 2021-07-14: qty 30

## 2021-07-14 MED ORDER — SODIUM CHLORIDE 0.9 % IV SOLN: 250.0000 mL | INTRAVENOUS | Status: DC | PRN

## 2021-07-14 MED ORDER — FENTANYL CITRATE (PF) 100 MCG/2ML IJ SOLN
INTRAMUSCULAR | Status: DC | PRN
  Administered 2021-07-14 (×2): 50 ug via INTRAVENOUS

## 2021-07-14 MED ORDER — SODIUM CHLORIDE 0.9% FLUSH: 3.0000 mL | Freq: Two times a day (BID) | INTRAVENOUS | Status: DC

## 2021-07-14 SURGICAL SUPPLY — 17 items
BAG SNAP BAND KOVER 36X36 (MISCELLANEOUS) ×2 IMPLANT
BALLN LUTONIX AV 8X40X75 (BALLOONS) ×2
BALLN MUSTANG 8X60X75 (BALLOONS) ×2
BALLOON LUTONIX AV 8X40X75 (BALLOONS) IMPLANT
BALLOON MUSTANG 8X60X75 (BALLOONS) IMPLANT
CATH ANGIO 5F BER2 65CM (CATHETERS) ×1 IMPLANT
COVER DOME SNAP 22 D (MISCELLANEOUS) ×2 IMPLANT
KIT ENCORE 26 ADVANTAGE (KITS) ×1 IMPLANT
KIT MICROPUNCTURE NIT STIFF (SHEATH) ×1 IMPLANT
PROTECTION STATION PRESSURIZED (MISCELLANEOUS) ×2
SHEATH PINNACLE R/O II 6F 4CM (SHEATH) ×1 IMPLANT
SHEATH PROBE COVER 6X72 (BAG) ×2 IMPLANT
STATION PROTECTION PRESSURIZED (MISCELLANEOUS) ×1 IMPLANT
STOPCOCK MORSE 400PSI 3WAY (MISCELLANEOUS) ×2 IMPLANT
TRAY PV CATH (CUSTOM PROCEDURE TRAY) ×1 IMPLANT
TUBING CIL FLEX 10 FLL-RA (TUBING) ×2 IMPLANT
WIRE STARTER BENTSON 035X150 (WIRE) ×1 IMPLANT

## 2021-07-14 NOTE — Progress Notes (Signed)
Discharge instructions reviewed with pt and her daughter with interpreter at bedside. Voices understanding.

## 2021-07-14 NOTE — Op Note (Signed)
    Patient name: Sabrina Mitchell MRN: [id number] DOB: 1961-05-19 Sex: female  07/14/2021 Pre-operative Diagnosis: End-stage renal disease, malfunction left arm AV fistula Post-operative diagnosis:  Same Surgeon:  Eda Paschal. Donzetta Matters, MD Procedure Performed: 1.  Ultrasound-guided cannulation left arm AV fistula 2.  Left upper extremity fistulogram 3.  Drug-coated balloon angioplasty with 8 mm balloon of left cephalic arch and cannulation zone left arm fistula 4.  Moderate sedation with fentanyl and Versed for 42 minutes   Indications: 61 year old female on dialysis via left arm AV fistula.  This has been present for 11 years.  She does have significant pain she is also had some difficulty with cannulation and decreased flow rates.  She is now negated for fistulogram with possible invention.  Findings: Patient had significant pain throughout the case even when we were not manipulating the fistula.  This did limit our ability to treat.  She did have approximately 50% stenosis of the cephalic arch which was treated with drug-coated balloon angioplasty to less than 20%.  There was 1 area in the mid segment of the fistula between 2 pseudoaneurysms that appeared to have very high as stenosis percentage I could not get adequate imaging but the wire did ultimately cross after balloon angioplasty there is much better flow across this.  I would estimate initially 80% to less than 20% at completion.  There is an improved thrill in the fistula.  The fistula can be cannulated tomorrow for dialysis.   Procedure:  The patient was identified in the holding area and taken to room 8.  The patient was then placed supine on the table and prepped and draped in the usual sterile fashion.  A time out was called.  Ultrasound was used to evaluate the left arm AV fistula.  There was an anesthetized 1% lidocaine cannulated with micropuncture needle followed by wire sheath with direct ultrasound visualization.  And images  saved the permanent record.  Left upper extremity fistulogram was performed.  With the above findings we changed for a short 6 [place] sheath over a Bentson.  I did need bare catheter to cross the tight stenosis in the midsegment.  I also used a bare catheter across the tight stenosis at the cephalic arch.  These were then primarily dilated with 8 mm balloon followed by drug-coated balloon angioplasty.  Completion demonstrated the above findings.  Satisfied with this we remove the wire.  We sutured cannulated the access site.  She tolerated seizure well without immediate complication.   Contrast: 30 cc  Coben Godshall C. Donzetta Matters, MD [facility] Office: [phone] Pager: [phone]

## 2021-07-14 NOTE — H&P (Signed)
H+P  History of Present Illness: This is a 60 y.o. female history of end-stage renal disease on dialysis via left arm AV fistula.  This was placed 11 years ago.  More recently she has had difficulty with cannulation and flow rates.  She has undergone plication of this fistula as well as fistulogram in the past.  She is now indicated for further fistulogram and possibly will need surgical intervention.   All information obtained via interpreter and via the patient's daughter on the telephone Past Medical History:  Diagnosis Date   Anemia    Anxiety    Complication of anesthesia    Depression    denies   End stage renal disease ([place])    hemodialysis 3x/wk @ [place]   Family history of adverse reaction to anesthesia    sister vomiting after uterine cyst removal   Gastritis    GERD (gastroesophageal reflux disease)    Hemorrhoids    Hypertension    Pneumonia    years ago   PONV (postoperative nausea and vomiting)    UTI (lower urinary tract infection) March 2014    Past Surgical History:  Procedure Laterality Date   AV FISTULA PLACEMENT Left 07/05/09   Dr. Kellie Simmering   COLONOSCOPY N/A 02/10/2016   SLF: 1. HEME postive stool due to colon polyps intrernal hemorrhoids 2. small internal hemrrohoids 3. moderate external hemorrhoids.    ESOPHAGEAL DILATION N/A 02/10/2016   Procedure: ESOPHAGEAL DILATION;  Surgeon: Danie Binder, MD;  [facility];  Service: Endoscopy;  Laterality: N/A;   ESOPHAGOGASTRODUODENOSCOPY N/A 02/10/2016   SLF: patent Schatzki's ring , mild gastritis/ duodentitis.    FISTULA SUPERFICIALIZATION Left 64/03/346   Procedure: PLICATION BRACHIOCEPHALIC FISTULA;  Surgeon: Angelia Mould, MD;  Location: [place];  Service: Vascular;  Laterality: Left;   FISTULOGRAM N/A 10/22/2014   Procedure: FISTULOGRAM;  Surgeon: Angelia Mould, MD;  Location: [facility] CATH LAB;  Service: Cardiovascular;  Laterality: N/A;   IR DIALY SHUNT INTRO  NEEDLE/INTRACATH INITIAL W/IMG LEFT Left 02/01/2019   IR DIALY SHUNT INTRO NEEDLE/INTRACATH INITIAL W/IMG LEFT Left 06/05/2020   IR DIALY SHUNT INTRO NEEDLE/INTRACATH INITIAL W/IMG LEFT Left 05/09/2021   PERIPHERAL VASCULAR CATHETERIZATION Left 12/09/2016   Procedure: A/V Fistulagram;  Surgeon: Waynetta Sandy, MD;  Location: [place] CV LAB;  Service: Cardiovascular;  Laterality: Left;   REVISON OF ARTERIOVENOUS FISTULA Left 42/59/5638   Procedure: PLICATION OF BRACHIOCEPHALIC FISTULA;  Surgeon: Conrad Eagle, MD;  Location: [place];  Service: Vascular;  Laterality: Left;   REVISON OF ARTERIOVENOUS FISTULA Left 08/09/2017   Procedure: REVISION OF ARTERIOVENOUS FISTULA LIGATION OF SIDE BRANCH;  Surgeon: Waynetta Sandy, MD;  Location: [place];  Service: Vascular;  Laterality: Left;   TUBAL LIGATION  2004    Allergies  Allergen Reactions   Covid-19 (Mrna) Vaccine Swelling    Tongue swelling   Remeron [Mirtazapine] Other (See Comments)    Chest pain   Zoloft [Sertraline]     CP and chills   Amitriptyline Other (See Comments)    Makes her shake   Trazodone And Nefazodone Other (See Comments)    Makes her shake    Prior to Admission medications   Medication Sig Start Date End Date Taking? Authorizing Provider  acetaminophen (TYLENOL) 500 MG tablet Take 500-1,000 mg by mouth every 6 (six) hours as needed for moderate pain or headache.   Yes [provider]  amLODipine (NORVASC) 5 MG tablet Take 1 tablet by  mouth twice a day for high blood pressure Patient taking differently: Take 5 mg by mouth in the morning and at bedtime. 05/22/21  Yes   B Complex-C-Folic Acid (DIALYVITE 371) 0.8 MG TABS Take 1 tablet by mouth daily in the afternoon. 09/06/19  Yes [provider]  carvedilol (COREG) 6.25 MG tablet TAKE 1 TABLET BY MOUTH ONCE A DAY AS DIRECTED FOR HIGH BLOOD PRESSURE Patient taking differently: Take 6.25 mg by mouth in the morning. 01/30/21 01/30/22 Yes Penninger,  Ria Comment, PA  ethyl chloride spray APPLY A SMALL AMOUNT OF SPRAY TO DIALYSIS ACCESS JUST BEFORE NEEDLE STICK THREE TIMES A WEEK AS DIRECTED Patient taking differently: Apply 1 application topically as needed (prior to dialysis access). 08/06/20 08/06/21 Yes Ejigiri, Thomos Lemons, PA-C  Omega-3 Fatty Acids (OMEGA 3 500) 500 MG CAPS Take 500 mg by mouth daily as needed (burning in arm (due to dialysis)).   Yes [provider]  omeprazole (PRILOSEC) 20 MG capsule Take 1 capsule (20 mg total) by mouth daily. 1 PO 30 mins prior to breakfast and supper Patient taking differently: Take 20 mg by mouth daily before breakfast. 08/06/17  Yes Ladell Pier, MD  sucroferric oxyhydroxide (VELPHORO) 500 MG chewable tablet Chew 500-1,000 mg by mouth See admin instructions. Take 1 tablet (500 mg) by mouth with each snack & take 1-2 tablets (500 mg-1000 mg) by mouth with each meal 11/07/19  Yes [provider]  Vitamin D, Ergocalciferol, (DRISDOL) 1.25 MG (50000 UNIT) CAPS capsule Take 1 capsule (50,000 Units total) by mouth every 7 (seven) days. 06/21/21  Yes Ladell Pier, MD  zolpidem (AMBIEN) 5 MG tablet Take 1 tablet (5 mg total) by mouth at bedtime as needed for sleep. 07/02/21  Yes Ladell Pier, MD  albuterol (VENTOLIN HFA) 108 (90 Base) MCG/ACT inhaler INHALE 2 PUFF USING INHALER EVERY FOUR TO SIX HOURS WHILE AWAKE AS NEEDED FOR WHEEZING, COUGH, OR SHORTNESS OF BREATH Patient not taking: Reported on 07/11/2021 02/20/21 02/20/22  Loren Racer, PA-C  nystatin-triamcinolone ointment [facility]) Apply 1 application topically 2 (two) times daily. Patient taking differently: Apply 1 application topically 2 (two) times daily as needed (skin irritation/itching  (yeast)). 05/28/21   Mayers, Loraine Grip, PA-C    Social History   Socioeconomic History   Marital status: Significant Other    Spouse name: Not on file   Number of children: 2   Years of education: Not on file   Highest education  level: Not on file  Occupational History   Not on file  Tobacco Use   Smoking status: Never   Smokeless tobacco: Never  Vaping Use   Vaping Use: Never used  Substance and Sexual Activity   Alcohol use: No   Drug use: No   Sexual activity: Not Currently    Birth control/protection: Surgical  Other Topics Concern   Not on file  Social History Narrative   Not on file   Social Determinants of Health   Financial Resource Strain: Not on file  Food Insecurity: Not on file  Transportation Needs: Not on file  Physical Activity: Not on file  Stress: Not on file  Social Connections: Not on file  Intimate Partner Violence: Not on file    \ Family History  Problem Relation Age of Onset   Hypertension Mother    Hypertension Father    Hypertension Sister    Breast cancer Sister 38   Diabetes Brother    Hypertension Brother  ROS: Left arm pain   Physical Examination  Vitals:   07/14/21 1043  BP: (!) 173/79  Pulse: 83  Temp: 98.7 F (37.1 C)  SpO2: 100%   Body mass index is 26.05 kg/m.  General: No acute distress HENT: WNL, normocephalic Pulmonary: normal non-labored breathing, without Rales, rhonchi,  wheezing Cardiac: Palpable radial pulse Musculoskeletal: no muscle wasting or atrophy there is a large fistula in the upper arm which appears tortuous and has pulsatility throughout  Neurologic: A&O X 3   CBC    Component Value Date/Time   WBC 5.1 03/12/2021 0954   WBC 6.8 02/22/2021 1817   RBC 3.18 (L) 03/12/2021 0954   RBC 3.76 (L) 02/22/2021 1817   HGB 9.5 (L) 07/14/2021 1143   HGB 10.1 (L) 03/12/2021 0954   HCT 28.0 (L) 07/14/2021 1143   HCT 30.6 (L) 03/12/2021 0954   PLT 232 03/12/2021 0954   MCV 96 03/12/2021 0954   MCH 31.8 03/12/2021 0954   MCH 31.4 02/22/2021 1817   MCHC 33.0 03/12/2021 0954   MCHC 32.7 02/22/2021 1817   RDW 14.3 03/12/2021 0954   LYMPHSABS 1.1 03/12/2021 0954   MONOABS 0.4 09/26/2019 0702   EOSABS 0.1 03/12/2021 0954    BASOSABS 0.0 03/12/2021 0954    BMET    Component Value Date/Time   NA 138 07/14/2021 1143   NA 140 03/27/2020 1347   K 4.0 07/14/2021 1143   CL 99 07/14/2021 1143   CO2 28 02/22/2021 1817   GLUCOSE 84 07/14/2021 1143   BUN 63 (H) 07/14/2021 1143   BUN 46 (H) 03/27/2020 1347   CREATININE 9.60 (H) 07/14/2021 1143   CALCIUM 9.8 02/22/2021 1817   GFRNONAA 12 (L) 02/22/2021 1817   GFRAA 7 (L) 03/27/2020 1347    COAGS: No results found for: INR, PROTIME    ASSESSMENT/PLAN: This is a 60 y.o. female history of end-stage renal disease now with malfunction of her left arm AV fistula which has been present for 11 years.  She is now indicated for fistulogram with possible intervention.  I discussed the risk benefits alternatives via interpreter and also with the daughter via telephone and patient and family agreed to proceed.     Tony Granquist C. Donzetta Matters, MD [facility] [place] Office: [phone] Pager: [phone]

## 2021-07-15 ENCOUNTER — Encounter (HOSPITAL_COMMUNITY): Payer: Self-pay | Admitting: Vascular Surgery

## 2021-07-18 ENCOUNTER — Telehealth: Payer: Self-pay

## 2021-07-18 NOTE — Telephone Encounter (Signed)
Error didn't mean to open this note

## 2021-07-18 NOTE — Telephone Encounter (Signed)
[place] Id#  [phone] contacted pt to go over mm results  pt is aware and doesn't have any questions or concerns

## 2021-07-25 ENCOUNTER — Other Ambulatory Visit: Payer: Self-pay

## 2021-07-25 MED FILL — Carvedilol Tab 6.25 MG: ORAL | 30 days supply | Qty: 30 | Fill #1 | Status: AC

## 2021-08-12 ENCOUNTER — Telehealth: Payer: Self-pay | Admitting: Internal Medicine

## 2021-08-12 NOTE — Telephone Encounter (Signed)
PT Requesting refill of zolpidem (AMBIEN) 5 MG tablet [[id number]]  [facility] ([place]), [place] - [address]  [address] [facility] [place] [address]  Phone:  [phone]  Fax:  [phone]   PT states it's urgent please advise and thank you

## 2021-08-13 NOTE — Telephone Encounter (Signed)
[facility] was called and patient has refills on the medication and they will get the medication ready for pick up.

## 2021-08-15 ENCOUNTER — Other Ambulatory Visit: Payer: Self-pay

## 2021-08-15 ENCOUNTER — Other Ambulatory Visit: Payer: Self-pay | Admitting: Nephrology

## 2021-08-17 ENCOUNTER — Emergency Department (HOSPITAL_COMMUNITY): Payer: No Typology Code available for payment source

## 2021-08-17 ENCOUNTER — Emergency Department (HOSPITAL_COMMUNITY)
Admission: EM | Admit: 2021-08-17 | Discharge: 2021-08-17 | Disposition: A | Discharge status: Home / Self Care | Payer: No Typology Code available for payment source | Attending: Emergency Medicine | Admitting: Emergency Medicine

## 2021-08-17 ENCOUNTER — Encounter (HOSPITAL_COMMUNITY): Payer: Self-pay | Admitting: Emergency Medicine

## 2021-08-17 ENCOUNTER — Other Ambulatory Visit: Payer: Self-pay

## 2021-08-17 DIAGNOSIS — T82898A Other specified complication of vascular prosthetic devices, implants and grafts, initial encounter: Secondary | ICD-10-CM

## 2021-08-17 DIAGNOSIS — T82590A Other mechanical complication of surgically created arteriovenous fistula, initial encounter: Secondary | ICD-10-CM | POA: Insufficient documentation

## 2021-08-17 DIAGNOSIS — Z992 Dependence on renal dialysis: Secondary | ICD-10-CM | POA: Insufficient documentation

## 2021-08-17 DIAGNOSIS — N186 End stage renal disease: Secondary | ICD-10-CM | POA: Insufficient documentation

## 2021-08-17 DIAGNOSIS — I12 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 disease: Secondary | ICD-10-CM | POA: Insufficient documentation

## 2021-08-17 DIAGNOSIS — X58XXXA Exposure to other specified factors, initial encounter: Secondary | ICD-10-CM | POA: Insufficient documentation

## 2021-08-17 DIAGNOSIS — Z79899 Other long term (current) drug therapy: Secondary | ICD-10-CM | POA: Insufficient documentation

## 2021-08-17 LAB — CBC WITH DIFFERENTIAL/PLATELET
Abs Immature Granulocytes: 0.01 10*3/uL (ref 0.00–0.07)
Basophils Absolute: 0 10*3/uL (ref 0.0–0.1)
Basophils Relative: 0 %
Eosinophils Absolute: 0 10*3/uL (ref 0.0–0.5)
Eosinophils Relative: 1 %
HCT: 21.5 % — ABNORMAL LOW (ref 36.0–46.0)
Hemoglobin: 7.2 g/dL — ABNORMAL LOW (ref 12.0–15.0)
Immature Granulocytes: 0 %
Lymphocytes Relative: 22 %
Lymphs Abs: 1.1 10*3/uL (ref 0.7–4.0)
MCH: 30.9 pg (ref 26.0–34.0)
MCHC: 33.5 g/dL (ref 30.0–36.0)
MCV: 92.3 fL (ref 80.0–100.0)
Monocytes Absolute: 0.3 10*3/uL (ref 0.1–1.0)
Monocytes Relative: 6 %
Neutro Abs: 3.6 10*3/uL (ref 1.7–7.7)
Neutrophils Relative %: 71 %
Platelets: 137 10*3/uL — ABNORMAL LOW (ref 150–400)
RBC: 2.33 MIL/uL — ABNORMAL LOW (ref 3.87–5.11)
RDW: 13.1 % (ref 11.5–15.5)
WBC: 5 10*3/uL (ref 4.0–10.5)
nRBC: 0 % (ref 0.0–0.2)

## 2021-08-17 LAB — BASIC METABOLIC PANEL
Anion gap: 14 (ref 5–15)
BUN: 74 mg/dL — ABNORMAL HIGH (ref 6–20)
CO2: 24 mmol/L (ref 22–32)
Calcium: 9.6 mg/dL (ref 8.9–10.3)
Chloride: 98 mmol/L (ref 98–111)
Creatinine, Ser: 10.16 mg/dL — ABNORMAL HIGH (ref 0.44–1.00)
GFR, Estimated: 4 mL/min — ABNORMAL LOW (ref >60)
Glucose, Bld: 84 mg/dL (ref 70–99)
Potassium: 3.8 mmol/L (ref 3.5–5.1)
Sodium: 136 mmol/L (ref 135–145)

## 2021-08-17 MED ORDER — ONDANSETRON HCL 4 MG/2ML IJ SOLN
4.0000 mg | Freq: Once | INTRAMUSCULAR | Status: AC
  Administered 2021-08-17: 4 mg via INTRAVENOUS
  Filled 2021-08-17: qty 2

## 2021-08-17 MED ORDER — MORPHINE SULFATE (PF) 4 MG/ML IV SOLN
4.0000 mg | Freq: Once | INTRAVENOUS | Status: AC
  Administered 2021-08-17: 4 mg via INTRAVENOUS
  Filled 2021-08-17: qty 1

## 2021-08-17 NOTE — Discharge Instructions (Addendum)
We spoke with the Nephrology team - they recommend that you stay NPO after midnight and that they will call you in the morning to set up an appointment to get the fistula declotted.   Return to the ER if you start having shortness of breath.  We recommend you come to the ER again tomorrow if we can't schedule the procedure tomorrow.

## 2021-08-17 NOTE — Progress Notes (Signed)
Brief nephrology observation note:  60 year old female ESRD on HD TTS schedule at [facility] presented after bleeding from the AV fistula site.  Apparently patient was on dialysis yesterday when there was excessive bleeding from the fistula site and she was directed to come to the ER.  She went home and waited till today to come to ER today.  She received only 1 hour of HD. On examination AV fistula on left UE has aneurysmal dilatation with good thrill and bruit on the lower side.  No evidence of infection. Patient denied fever, chills, nausea, vomiting, chest pain, shortness of breath.  Labs consistent with CKD with drop in hemoglobin.  Chest x-ray without overt pulmonary edema.  Plan: Recommend IR consult for fistulogram and declotting. Patient can be discharged home after successful declotting and then she can resume dialysis outpatient.  No urgent need for dialysis today. Full consult if she is admitted to the hospital. Discussed with Dr. Gilford Raid in ER and her daughter over the phone.  Sabrina James, MD [facility].

## 2021-08-17 NOTE — ED Provider Notes (Addendum)
  Physical Exam  BP 133/65   Pulse 66   Temp 98.9 F (37.2 C) (Oral)   Resp 17   LMP 02/11/2012 Comment: tubal ligation  SpO2 98%   Physical Exam  ED Course/Procedures     .Critical Care  Date/Time: 08/17/2021 4:01 PM Performed by: Varney Biles, MD Authorized by: Varney Biles, MD   Critical care provider statement:    Critical care time (minutes):  30   Critical care was necessary to treat or prevent imminent or life-threatening deterioration of the following conditions:  Circulatory failure and renal failure (thromboses fistula, preventing unneccesary admission)   Critical care was time spent personally by me on the following activities:  Discussions with consultants, evaluation of patient's response to treatment, examination of patient, ordering and performing treatments and interventions, ordering and review of laboratory studies, ordering and review of radiographic studies, pulse oximetry, re-evaluation of patient's condition, obtaining history from patient or surrogate and review of old charts  MDM    Assuming care of patient from Dr. Gilford Raid.   Patient in the ED for clogged AV fistula. Workup thus far shows no need for emergent HD.  Important pending results are: definitive management from consultants.  She is already spoken with vascular surgery, who has requested nephrology to furnish this task.  I have reviewed the nephrology note, that recommends IR consultation  I spoke with Dr. Dwaine Gale, interventional radiology.  He reports that they will not be able to declog the fistula today.  They cannot guarantee a scheduled appointment tomorrow either given how busy it is on Mondays.  They did request I speak with nephrology again to see if they can manage the patient in the outpatient center.  I paged nephrology again and spoke with Dr. Carolin Sicks, who will get back to me in few minutes with definitive plan.  4:11 PM Discussed case with Dr. Carolin Sicks again.  We were able to  successfully schedule an outpatient follow-up for her in the dialysis clinic for the declotting procedure.  Patient advised to be n.p.o. after midnight and expect a call from the dialysis center to get the appointment set up.  She is stable for discharge.  6:25 PM Received a call from Dr. Carolin Sicks, that patient's payer status is a barrier for outpatient nephrology clinic to complete the procedure as an outpatient.  I spoke with the interventional nephrologist who confirmed. Spoke with Dr. Dwaine Gale, IR again -he has taken patient's information and someone from IR clinic she would contact them tomorrow.  Patient made aware of the plan, all questions answered to the best of my ability.  Daughter, who speaks fluent English was at the bedside.     Varney Biles, MD 08/17/21 [phone]

## 2021-08-17 NOTE — ED Triage Notes (Addendum)
Pt had dialysis yesterday and was only able to complete 1 hour because L upper arm fistula was bleeding and painful.  Pt was encouraged to come to ED but was scared so she went home.  Reports continued pain.  Band-aids in place with no active bleeding.  States fistula is 60 yrs old.

## 2021-08-17 NOTE — ED Provider Notes (Signed)
[facility] EMERGENCY DEPARTMENT Provider Note   CSN: [id number] Arrival date & time: 08/17/21  1144     History Chief Complaint  Patient presents with   Vascular Access Problem    Sabrina Mitchell is a 60 y.o. female.  Pt presents to the ED today with a non-working left AV fistula.  Pt went to dialysis yesterday and said they could not get it to work right.  She said they would give her dialysis in 1 place, then it would stop working and the machine would beep.  They did this several times in multiple places and finally gave up after 1 hr.  Pt said there is a lot of pain in her fistula.  Pt had the fistula placed 11 years ago.  Dr. Donzetta Matters did a LUE fistulogram and an angioplasty of the left cephalic arch on 7/67/34.   Due to language barrier, an interpreter was present during the history-taking and subsequent discussion (and for part of the physical exam) with this patient.       Past Medical History:  Diagnosis Date   Anemia    Anxiety    Complication of anesthesia    Depression    denies   End stage renal disease ([place])    hemodialysis 3x/wk @ [facility]   Family history of adverse reaction to anesthesia    sister vomiting after uterine cyst removal   Gastritis    GERD (gastroesophageal reflux disease)    Hemorrhoids    Hypertension    Pneumonia    years ago   PONV (postoperative nausea and vomiting)    UTI (lower urinary tract infection) March 2014    Patient Active Problem List   Diagnosis Date Noted   Psychophysiological insomnia 02/13/2021   New onset of headaches 02/13/2021   Depression with anxiety 10/30/2020   Acute respiratory failure with hypoxia ([place]) 09/27/2019   Pulmonary edema 09/26/2019   Chest pain 09/26/2019   Elevated troponin 09/26/2019   Calcaneal spur of right foot 10/10/2018   Ulnar neuropathy of left upper extremity 03/11/2018   GERD (gastroesophageal reflux disease) 01/17/2016   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 Kellie Simmering   COLONOSCOPY N/A 02/10/2016   SLF: 1. HEME postive stool due to colon polyps intrernal hemorrhoids 2. small internal hemrrohoids 3. moderate external hemorrhoids.    ESOPHAGEAL DILATION N/A 02/10/2016   Procedure: ESOPHAGEAL DILATION;  Surgeon: Danie Binder, MD;  [facility];  Service: Endoscopy;  Laterality: N/A;   ESOPHAGOGASTRODUODENOSCOPY N/A 02/10/2016   SLF: patent Schatzki's ring , mild gastritis/ duodentitis.    FISTULA SUPERFICIALIZATION Left 19/02/7901   Procedure: PLICATION BRACHIOCEPHALIC FISTULA;  Surgeon: Angelia Mould, MD;  Location: [facility];  Service: Vascular;  Laterality: Left;   FISTULOGRAM N/A 10/22/2014   Procedure: FISTULOGRAM;  Surgeon: Angelia Mould, MD;  Location: [facility] CATH LAB;  Service: Cardiovascular;  Laterality: N/A;   IR DIALY SHUNT INTRO NEEDLE/INTRACATH INITIAL W/IMG LEFT Left 02/01/2019   IR DIALY SHUNT INTRO NEEDLE/INTRACATH INITIAL W/IMG LEFT Left 06/05/2020   IR DIALY SHUNT INTRO NEEDLE/INTRACATH INITIAL W/IMG LEFT Left 05/09/2021   PERIPHERAL VASCULAR BALLOON ANGIOPLASTY Left 07/14/2021   Procedure: PERIPHERAL VASCULAR BALLOON ANGIOPLASTY;  Surgeon: Donzetta Matters,  Georgia Dom, MD;  Location: [facility] CV LAB;  Service: Cardiovascular;  Laterality: Left;   PERIPHERAL VASCULAR CATHETERIZATION Left 12/09/2016   Procedure: A/V Fistulagram;  Surgeon: Waynetta Sandy, MD;  Location: [facility] CV LAB;  Service: Cardiovascular;  Laterality: Left;   REVISON OF ARTERIOVENOUS FISTULA Left 66/29/4765   Procedure: PLICATION OF BRACHIOCEPHALIC FISTULA;  Surgeon: Conrad [place], MD;  Location: [facility];  Service: Vascular;  Laterality:  Left;   REVISON OF ARTERIOVENOUS FISTULA Left 08/09/2017   Procedure: REVISION OF ARTERIOVENOUS FISTULA LIGATION OF SIDE BRANCH;  Surgeon: Waynetta Sandy, MD;  Location: [facility];  Service: Vascular;  Laterality: Left;   TUBAL LIGATION  2004     OB History     Gravida  5   Para      Term      Preterm      AB  3   Living  2      SAB  3   IAB      Ectopic      Multiple      Live Births  2           Family History  Problem Relation Age of Onset   Hypertension Mother    Hypertension Father    Hypertension Sister    Breast cancer Sister 48   Diabetes Brother    Hypertension Brother     Social History   Tobacco Use   Smoking status: Never   Smokeless tobacco: Never  Vaping Use   Vaping Use: Never used  Substance Use Topics   Alcohol use: No   Drug use: No    Home Medications Prior to Admission medications   Medication Sig Start Date End Date Taking? Authorizing Provider  acetaminophen (TYLENOL) 500 MG tablet Take 500-1,000 mg by mouth every 6 (six) hours as needed for moderate pain or headache.    [provider]  albuterol (VENTOLIN HFA) 108 (90 Base) MCG/ACT inhaler INHALE 2 PUFF USING INHALER EVERY FOUR TO SIX HOURS WHILE AWAKE AS NEEDED FOR WHEEZING, COUGH, OR SHORTNESS OF BREATH 02/20/21 02/20/22  Loren Racer, PA-C  amLODipine (NORVASC) 5 MG tablet Take 1 tablet by mouth twice a day for high blood pressure Patient taking differently: Take 5 mg by mouth in the morning and at bedtime. 05/22/21     B Complex-C-Folic Acid (DIALYVITE 465) 0.8 MG TABS Take 1 tablet by mouth daily in the afternoon. 09/06/19   [provider]  carvedilol (COREG) 6.25 MG tablet TAKE 1 TABLET BY MOUTH ONCE A DAY AS DIRECTED FOR HIGH BLOOD PRESSURE Patient taking differently: Take 6.25 mg by mouth in the morning. 01/30/21 01/30/22  Penninger, Ria Comment, PA  nystatin-triamcinolone ointment (MYCOLOG) Apply 1 application topically 2 (two) times  daily. Patient taking differently: Apply 1 application topically 2 (two) times daily as needed (skin irritation/itching  (yeast)). 05/28/21   Mayers, Cari S, PA-C  Omega-3 Fatty Acids (OMEGA 3 500) 500 MG CAPS Take 500 mg by mouth daily as needed (burning in arm (due to dialysis)).    [provider]  omeprazole (PRILOSEC) 20 MG capsule Take 1 capsule (20 mg total) by mouth daily. 1 PO 30 mins prior to breakfast and supper Patient taking differently: Take 20 mg by mouth daily before breakfast. 08/06/17   Ladell Pier, MD  sucroferric oxyhydroxide (VELPHORO) 500 MG chewable tablet Chew 500-1,000 mg by mouth See admin instructions. Take 1 tablet (500 mg) by mouth with each snack &  take 1-2 tablets (500 mg-1000 mg) by mouth with each meal 11/07/19   [provider]  Vitamin D, Ergocalciferol, (DRISDOL) 1.25 MG (50000 UNIT) CAPS capsule Take 1 capsule (50,000 Units total) by mouth every 7 (seven) days. 06/21/21   Ladell Pier, MD  zolpidem (AMBIEN) 5 MG tablet Take 1 tablet (5 mg total) by mouth at bedtime as needed for sleep. 07/02/21   Ladell Pier, MD    Allergies    Covid-19 (mrna) vaccine, Remeron [mirtazapine], Zoloft [sertraline], Amitriptyline, and Trazodone and nefazodone  Review of Systems   Review of Systems  Musculoskeletal:        Left arm pain  All other systems reviewed and are negative.  Physical Exam Updated Vital Signs BP (!) 146/60   Pulse 65   Temp 98.9 F (37.2 C) (Oral)   Resp 16   LMP 02/11/2012 Comment: tubal ligation  SpO2 100%   Physical Exam Vitals and nursing note reviewed.  Constitutional:      Appearance: Normal appearance.  HENT:     Head: Normocephalic and atraumatic.     Right Ear: External ear normal.     Left Ear: External ear normal.     Nose: Nose normal.     Mouth/Throat:     Mouth: Mucous membranes are moist.     Pharynx: Oropharynx is clear.  Eyes:     Extraocular Movements: Extraocular movements intact.      Conjunctiva/sclera: Conjunctivae normal.     Pupils: Pupils are equal, round, and reactive to light.  Cardiovascular:     Rate and Rhythm: Normal rate and regular rhythm.     Pulses: Normal pulses.     Heart sounds: Normal heart sounds.  Pulmonary:     Effort: Pulmonary effort is normal.     Breath sounds: Normal breath sounds.  Abdominal:     General: Abdomen is flat. Bowel sounds are normal.     Palpations: Abdomen is soft.  Musculoskeletal:        General: Normal range of motion.     Cervical back: Normal range of motion and neck supple.     Comments: + thrill.  AV fistula is tender.  Multiple areas of bruising.   Skin:    General: Skin is warm.     Capillary Refill: Capillary refill takes less than 2 seconds.  Neurological:     General: No focal deficit present.     Mental Status: She is alert and oriented to person, place, and time.  Psychiatric:        Mood and Affect: Mood normal.        Behavior: Behavior normal.    ED Results / Procedures / Treatments   Labs (all labs ordered are listed, but only abnormal results are displayed) Labs Reviewed  BASIC METABOLIC PANEL - Abnormal; Notable for the following components:      Result Value   BUN 74 (*)    Creatinine, Ser 10.16 (*)    GFR, Estimated 4 (*)    All other components within normal limits  CBC WITH DIFFERENTIAL/PLATELET - Abnormal; Notable for the following components:   RBC 2.33 (*)    Hemoglobin 7.2 (*)    HCT 21.5 (*)    Platelets 137 (*)    All other components within normal limits    EKG None  Radiology DG Chest Portable 1 View  Result Date: 08/17/2021 CLINICAL DATA:  Shortness of breath EXAM: PORTABLE CHEST 1 VIEW COMPARISON:  Radiograph 02/22/2021  FINDINGS: Unchanged, enlarged cardiac silhouette. There is no focal airspace consolidation. No large pleural effusion or visible pneumothorax. No acute osseous abnormality. IMPRESSION: Unchanged, enlarged cardiac silhouette without overt pulmonary edema.  Electronically Signed   By: Maurine Simmering M.D.   On: 08/17/2021 13:47    Procedures Procedures   Medications Ordered in ED Medications  morphine 4 MG/ML injection 4 mg (4 mg Intravenous Given 08/17/21 1335)  ondansetron (ZOFRAN) injection 4 mg (4 mg Intravenous Given 08/17/21 1333)    ED Course  I have reviewed the triage vital signs and the nursing notes.  Pertinent labs & imaging results that were available during my care of the patient were reviewed by me and considered in my medical decision making (see chart for details).    MDM Rules/Calculators/A&P                           Pt d/w Dr. Scot Dock (vascular). He said they do not manage clotted fistulas any more and to call nephrology.  I spoke with Dr. Carolin Sicks (nephrology) who will come see pt.  Dr. Carolin Sicks did see pt and recommended IR to declot.  Page to IR pending.  Pt signed out to Dr. Kathrynn Humble at shift change. Final Clinical Impression(s) / ED Diagnoses Final diagnoses:  AV fistula occlusion, initial encounter ([place])  ESRD on hemodialysis [facility])    Rx / DC Orders ED Discharge Orders     None        Isla Pence, MD 08/20/21 [phone]

## 2021-08-18 ENCOUNTER — Other Ambulatory Visit: Payer: Self-pay

## 2021-08-18 ENCOUNTER — Other Ambulatory Visit (HOSPITAL_COMMUNITY): Payer: Self-pay | Admitting: Nephrology

## 2021-08-18 ENCOUNTER — Other Ambulatory Visit: Payer: Self-pay | Admitting: Radiology

## 2021-08-18 ENCOUNTER — Ambulatory Visit (HOSPITAL_COMMUNITY)
Admission: RE | Admit: 2021-08-18 | Discharge: 2021-08-18 | Disposition: A | Discharge status: Home / Self Care | Payer: No Typology Code available for payment source | Source: Ambulatory Visit | Attending: Nephrology | Admitting: Nephrology

## 2021-08-18 ENCOUNTER — Encounter (HOSPITAL_COMMUNITY): Payer: Self-pay

## 2021-08-18 DIAGNOSIS — Z888 Allergy status to other drugs, medicaments and biological substances status: Secondary | ICD-10-CM | POA: Insufficient documentation

## 2021-08-18 DIAGNOSIS — I12 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 disease: Secondary | ICD-10-CM | POA: Insufficient documentation

## 2021-08-18 DIAGNOSIS — T82858A Stenosis of vascular prosthetic devices, implants and grafts, initial encounter: Secondary | ICD-10-CM | POA: Insufficient documentation

## 2021-08-18 DIAGNOSIS — T82848A Pain from vascular prosthetic devices, implants and grafts, initial encounter: Secondary | ICD-10-CM | POA: Insufficient documentation

## 2021-08-18 DIAGNOSIS — N186 End stage renal disease: Secondary | ICD-10-CM

## 2021-08-18 DIAGNOSIS — Z887 Allergy status to serum and vaccine status: Secondary | ICD-10-CM | POA: Insufficient documentation

## 2021-08-18 DIAGNOSIS — Y832 Surgical operation with anastomosis, bypass or graft as the cause of abnormal reaction of the patient, or of later complication, without mention of misadventure at the time of the procedure: Secondary | ICD-10-CM | POA: Insufficient documentation

## 2021-08-18 DIAGNOSIS — Z8249 Family history of ischemic heart disease and other diseases of the circulatory system: Secondary | ICD-10-CM | POA: Insufficient documentation

## 2021-08-18 DIAGNOSIS — Z992 Dependence on renal dialysis: Secondary | ICD-10-CM | POA: Insufficient documentation

## 2021-08-18 DIAGNOSIS — Z79899 Other long term (current) drug therapy: Secondary | ICD-10-CM | POA: Insufficient documentation

## 2021-08-18 HISTORY — PX: IR THROMBECTOMY AV FISTULA W/THROMBOLYSIS/PTA INC/SHUNT/IMG LEFT: IMG6106

## 2021-08-18 HISTORY — PX: IR US GUIDE VASC ACCESS LEFT: IMG2389

## 2021-08-18 MED ORDER — LIDOCAINE HCL 1 % IJ SOLN
INTRAMUSCULAR | Status: AC
  Filled 2021-08-18: qty 20

## 2021-08-18 MED ORDER — SODIUM CHLORIDE 0.9 % IV SOLN: INTRAVENOUS | Status: DC

## 2021-08-18 MED ORDER — LIDOCAINE-PRILOCAINE 2.5-2.5 % EX CREA
TOPICAL_CREAM | Freq: Once | CUTANEOUS | Status: DC
  Filled 2021-08-18: qty 5

## 2021-08-18 MED ORDER — THROMBIN FOR PERCUTANEOUS TREATMENT OF PSEUDOANEURYSM (5000UNITS/10ML)
Freq: Once | PERCUTANEOUS | Status: AC
  Administered 2021-08-18: 100 [IU] via PERCUTANEOUS
  Filled 2021-08-18: qty 1

## 2021-08-18 MED ORDER — FENTANYL CITRATE (PF) 100 MCG/2ML IJ SOLN
INTRAMUSCULAR | Status: AC
  Filled 2021-08-18: qty 2

## 2021-08-18 MED ORDER — MIDAZOLAM HCL 2 MG/2ML IJ SOLN
INTRAMUSCULAR | Status: AC | PRN
  Administered 2021-08-18 (×2): 0.5 mg via INTRAVENOUS

## 2021-08-18 MED ORDER — FENTANYL CITRATE (PF) 100 MCG/2ML IJ SOLN
INTRAMUSCULAR | Status: AC | PRN
  Administered 2021-08-18: 50 ug via INTRAVENOUS
  Administered 2021-08-18: 25 ug via INTRAVENOUS

## 2021-08-18 MED ORDER — IOHEXOL 240 MG/ML SOLN: 50.0000 mL | Freq: Once | INTRAMUSCULAR | Status: DC | PRN

## 2021-08-18 MED ORDER — MIDAZOLAM HCL 2 MG/2ML IJ SOLN
INTRAMUSCULAR | Status: AC
  Filled 2021-08-18: qty 2

## 2021-08-18 NOTE — Procedures (Signed)
Interventional Radiology Procedure Note  Procedure:  1) Left upper extremity fistulagram 2) Ultrasound-guided percutaneous thrombin injection  Findings: Please refer to procedural dictation for full description.  Patent left brachiocephalic fistula throughout, with persistent multifocal stenoses in the cephalic arch.  Ultrasound evaluation of the fistula and painful area of concern near the central puncture site demonstrates an approximately 2 cm pseudoaneurysm with narrow, long neck.  100 units thrombin injected percutaneously under ultrasound guidance with immediate cessation of flow.    Complications: None immediate  Estimated Blood Loss: < 5 mL  Recommendations: OK to continue to use fistula, as needed. If there are persistent problems with prolonged bleeding, pseudoaneurysm formation, poor flows, or upper extremity swelling, recommend consideration of cephalic arch stent placement.   Ruthann Cancer, MD Pager: [phone]

## 2021-08-18 NOTE — H&P (Addendum)
Chief Complaint: Patient was seen in consultation today for left arm dialysis fistula evaluation and possible intervention at the request of Rosita Fire  Referring Physician(s): Rosita Fire  Supervising Physician: Ruthann Cancer  Patient Status: [facility] - Out-pt  History of Present Illness: Sabrina Mitchell is a 60 y.o. female   ESRD Left arm fistula 60 yrs old Only recent trouble with this fistula; pain; excessive bleeding  01/2019 - IR fistulogram-- no intervention 05/2020: IR fistulogram- no intervention  Recent surgical procedure with Dr Donzetta Matters 07/14/2021 Pre-operative Diagnosis: End-stage renal disease, malfunction left arm AV fistula Post-operative diagnosis:  Same Surgeon:  Eda Paschal. Donzetta Matters, MD Procedure Performed: 1.  Ultrasound-guided cannulation left arm AV fistula 2.  Left upper extremity fistulogram 3.  Drug-coated balloon angioplasty with 8 mm balloon of left cephalic arch and cannulation zone left arm fistula  Last dialysis Thursday 08/14/21: did completer dialysis -- although painful and bleeding Attempted dialysis Saturday 8/20: only 30 minutes; painful; bleeding; clotted  Came to ED same day because of bleeding Dr Carolin Sicks requested evaluation and declot Pt was scheduled to return to IR Monday for this exam     Past Medical History:  Diagnosis Date   Anemia    Anxiety    Complication of anesthesia    Depression    denies   End stage renal disease ([place])    hemodialysis 3x/wk @ [facility]   Family history of adverse reaction to anesthesia    sister vomiting after uterine cyst removal   Gastritis    GERD (gastroesophageal reflux disease)    Hemorrhoids    Hypertension    Pneumonia    years ago   PONV (postoperative nausea and vomiting)    UTI (lower urinary tract infection) March 2014    Past Surgical History:  Procedure Laterality Date   AV FISTULA PLACEMENT Left 07/05/09   Dr. Kellie Simmering   COLONOSCOPY N/A  02/10/2016   SLF: 1. HEME postive stool due to colon polyps intrernal hemorrhoids 2. small internal hemrrohoids 3. moderate external hemorrhoids.    ESOPHAGEAL DILATION N/A 02/10/2016   Procedure: ESOPHAGEAL DILATION;  Surgeon: Danie Binder, MD;  [facility];  Service: Endoscopy;  Laterality: N/A;   ESOPHAGOGASTRODUODENOSCOPY N/A 02/10/2016   SLF: patent Schatzki's ring , mild gastritis/ duodentitis.    FISTULA SUPERFICIALIZATION Left 94/03/9674   Procedure: PLICATION BRACHIOCEPHALIC FISTULA;  Surgeon: Angelia Mould, MD;  Location: [place];  Service: Vascular;  Laterality: Left;   FISTULOGRAM N/A 10/22/2014   Procedure: FISTULOGRAM;  Surgeon: Angelia Mould, MD;  Location: [facility] CATH LAB;  Service: Cardiovascular;  Laterality: N/A;   IR DIALY SHUNT INTRO NEEDLE/INTRACATH INITIAL W/IMG LEFT Left 02/01/2019   IR DIALY SHUNT INTRO NEEDLE/INTRACATH INITIAL W/IMG LEFT Left 06/05/2020   IR DIALY SHUNT INTRO NEEDLE/INTRACATH INITIAL W/IMG LEFT Left 05/09/2021   PERIPHERAL VASCULAR BALLOON ANGIOPLASTY Left 07/14/2021   Procedure: PERIPHERAL VASCULAR BALLOON ANGIOPLASTY;  Surgeon: Waynetta Sandy, MD;  Location: [place] CV LAB;  Service: Cardiovascular;  Laterality: Left;   PERIPHERAL VASCULAR CATHETERIZATION Left 12/09/2016   Procedure: A/V Fistulagram;  Surgeon: Waynetta Sandy, MD;  Location: [place] CV LAB;  Service: Cardiovascular;  Laterality: Left;   REVISON OF ARTERIOVENOUS FISTULA Left 91/63/8466   Procedure: PLICATION OF BRACHIOCEPHALIC FISTULA;  Surgeon: Conrad [place], MD;  Location: [place];  Service: Vascular;  Laterality: Left;   REVISON OF ARTERIOVENOUS FISTULA Left 08/09/2017   Procedure: REVISION OF ARTERIOVENOUS FISTULA LIGATION OF SIDE BRANCH;  Surgeon: Waynetta Sandy, MD;  Location: [place];  Service: Vascular;  Laterality: Left;   TUBAL LIGATION  2004    Allergies: Covid-19 (mrna) vaccine, Remeron [mirtazapine], Zoloft [sertraline],  Amitriptyline, and Trazodone and nefazodone  Medications: Prior to Admission medications   Medication Sig Start Date End Date Taking? Authorizing Provider  amLODipine (NORVASC) 5 MG tablet Take 1 tablet by mouth twice a day for high blood pressure Patient taking differently: Take 5 mg by mouth in the morning and at bedtime. 05/22/21  Yes   carvedilol (COREG) 6.25 MG tablet TAKE 1 TABLET BY MOUTH ONCE A DAY AS DIRECTED FOR HIGH BLOOD PRESSURE Patient taking differently: Take 6.25 mg by mouth in the morning. 01/30/21 01/30/22 Yes Penninger, Ria Comment, PA  omeprazole (PRILOSEC) 20 MG capsule Take 1 capsule (20 mg total) by mouth daily. 1 PO 30 mins prior to breakfast and supper Patient taking differently: Take 20 mg by mouth daily before breakfast. 08/06/17  Yes Ladell Pier, MD  sucroferric oxyhydroxide (VELPHORO) 500 MG chewable tablet Chew 500-1,000 mg by mouth See admin instructions. Take 1 tablet (500 mg) by mouth with each snack & take 1-2 tablets (500 mg-1000 mg) by mouth with each meal 11/07/19  Yes [provider]  Vitamin D, Ergocalciferol, (DRISDOL) 1.25 MG (50000 UNIT) CAPS capsule Take 1 capsule (50,000 Units total) by mouth every 7 (seven) days. 06/21/21  Yes Ladell Pier, MD  zolpidem (AMBIEN) 5 MG tablet Take 1 tablet (5 mg total) by mouth at bedtime as needed for sleep. 07/02/21  Yes Ladell Pier, MD  acetaminophen (TYLENOL) 500 MG tablet Take 500-1,000 mg by mouth every 6 (six) hours as needed for moderate pain or headache.    [provider]  albuterol (VENTOLIN HFA) 108 (90 Base) MCG/ACT inhaler INHALE 2 PUFF USING INHALER EVERY FOUR TO SIX HOURS WHILE AWAKE AS NEEDED FOR WHEEZING, COUGH, OR SHORTNESS OF BREATH 02/20/21 02/20/22  Loren Racer, PA-C  B Complex-C-Folic Acid (DIALYVITE 213) 0.8 MG TABS Take 1 tablet by mouth daily in the afternoon. 09/06/19   [provider]  nystatin-triamcinolone ointment (MYCOLOG) Apply 1 application topically 2  (two) times daily. Patient taking differently: Apply 1 application topically 2 (two) times daily as needed (skin irritation/itching  (yeast)). 05/28/21   Mayers, Cari S, PA-C  Omega-3 Fatty Acids (OMEGA 3 500) 500 MG CAPS Take 500 mg by mouth daily as needed (burning in arm (due to dialysis)).    [provider]     Family History  Problem Relation Age of Onset   Hypertension Mother    Hypertension Father    Hypertension Sister    Breast cancer Sister 84   Diabetes Brother    Hypertension Brother     Social History   Socioeconomic History   Marital status: Significant Other    Spouse name: Not on file   Number of children: 2   Years of education: Not on file   Highest education level: Not on file  Occupational History   Not on file  Tobacco Use   Smoking status: Never   Smokeless tobacco: Never  Vaping Use   Vaping Use: Never used  Substance and Sexual Activity   Alcohol use: No   Drug use: No   Sexual activity: Not Currently    Birth control/protection: Surgical  Other Topics Concern   Not on file  Social History Narrative   Not on file   Social Determinants of Health   Financial Resource  Strain: Not on file  Food Insecurity: Not on file  Transportation Needs: Not on file  Physical Activity: Not on file  Stress: Not on file  Social Connections: Not on file     Review of Systems: A 12 point ROS discussed and pertinent positives are indicated in the HPI above.  All other systems are negative.  Review of Systems  Constitutional:  Negative for activity change, fatigue and fever.  Respiratory:  Negative for cough and shortness of breath.   Cardiovascular:  Negative for chest pain.  Gastrointestinal:  Negative for abdominal pain.  Psychiatric/Behavioral:  Negative for behavioral problems and confusion.    Vital Signs: LMP 02/11/2012 Comment: tubal ligation  Physical Exam Vitals reviewed.  Constitutional:      Comments: Spanish speaking With  interpreter   HENT:     Mouth/Throat:     Mouth: Mucous membranes are moist.  Cardiovascular:     Rate and Rhythm: Normal rate and regular rhythm.     Heart sounds: Normal heart sounds.  Pulmonary:     Breath sounds: Normal breath sounds.  Abdominal:     Palpations: Abdomen is soft.  Musculoskeletal:        General: Normal range of motion.     Comments: Left arm dialysis fistula Aneurysmal Tender to touch; ecchymotic areas  +thrill + pulses  Skin:    General: Skin is warm.  Neurological:     Mental Status: She is alert and oriented to person, place, and time.  Psychiatric:        Behavior: Behavior normal.    Imaging: DG Chest Portable 1 View  Result Date: 08/17/2021 CLINICAL DATA:  Shortness of breath EXAM: PORTABLE CHEST 1 VIEW COMPARISON:  Radiograph 02/22/2021 FINDINGS: Unchanged, enlarged cardiac silhouette. There is no focal airspace consolidation. No large pleural effusion or visible pneumothorax. No acute osseous abnormality. IMPRESSION: Unchanged, enlarged cardiac silhouette without overt pulmonary edema. Electronically Signed   By: Maurine Simmering M.D.   On: 08/17/2021 13:47    Labs:  CBC: Recent Labs    02/22/21 1817 03/12/21 0954 07/14/21 1143 08/17/21 1243  WBC 6.8 5.1  --  5.0  HGB 11.8* 10.1* 9.5* 7.2*  HCT 36.1 30.6* 28.0* 21.5*  PLT 178 232  --  137*    COAGS: No results for input(s): INR, APTT in the last 8760 hours.  BMP: Recent Labs    02/22/21 1817 07/14/21 1143 08/17/21 1243  NA 136 138 136  K 2.8* 4.0 3.8  CL 94* 99 98  CO2 28  --  24  GLUCOSE 97 84 84  BUN 17 63* 74*  CALCIUM 9.8  --  9.6  CREATININE 4.04* 9.60* 10.16*  GFRNONAA 12*  --  4*    LIVER FUNCTION TESTS: No results for input(s): BILITOT, AST, ALT, ALKPHOS, PROT, ALBUMIN in the last 8760 hours.  TUMOR MARKERS: No results for input(s): AFPTM, CEA, CA199, CHROMGRNA in the last 8760 hours.  Assessment and Plan:  ESRD Left arm dialysis fistula "clotted"-- unable to  dialyze Saturday Has good thrill and pulse Scheduled for fistulogram with possible intervention--- possible tunneled dilaysis catheter placement if needed Risks and benefits discussed with the patient including, but not limited to bleeding, infection, vascular injury, pulmonary embolism, need for tunneled HD catheter placement or even death.  All of the patient's questions were answered, patient is agreeable to proceed. Consent signed and in chart.    Thank you for this interesting consult.  I greatly enjoyed meeting Sabrina Mitchell and look forward to participating in their care.  A copy of this report was sent to the requesting provider on this date.  Electronically Signed: Lavonia Drafts, PA-C 08/18/2021, 12:03 PM   I spent a total of  30 Minutes   in face to face in clinical consultation, greater than 50% of which was counseling/coordinating care for fistulogram with possible intervention

## 2021-08-19 ENCOUNTER — Other Ambulatory Visit: Payer: Self-pay

## 2021-08-19 MED ORDER — ETHYL CHLORIDE EX AERO
INHALATION_SPRAY | CUTANEOUS | 3 refills | Status: AC
  Filled 2021-08-19: qty 116, 30d supply, fill #0

## 2021-08-19 MED ORDER — ETHYL CHLORIDE EX AERO
INHALATION_SPRAY | CUTANEOUS | 3 refills | Status: DC
  Filled 2021-08-19: qty 116, 30d supply, fill #0

## 2021-08-20 ENCOUNTER — Other Ambulatory Visit: Payer: Self-pay

## 2021-08-21 ENCOUNTER — Other Ambulatory Visit: Payer: Self-pay

## 2021-08-21 MED FILL — Carvedilol Tab 6.25 MG: ORAL | 30 days supply | Qty: 30 | Fill #2 | Status: AC

## 2021-08-22 ENCOUNTER — Other Ambulatory Visit: Payer: Self-pay

## 2021-08-26 ENCOUNTER — Other Ambulatory Visit: Payer: Self-pay

## 2021-08-26 NOTE — Progress Notes (Addendum)
I spoke with Ms Corky Crafts using a [address]  [place], SX#[id number]. Patient denies shortness of breath or shortness breath. Patient denies having any s/s of Covid in her household.  Patient denies any known exposure to Covid.  Ms Montez Morita did not complete Home Medication list, she will brig a list of medications. I asked patient if she still takes Amlodipine and Coreg, she said yes, I instructed patient to take those and tylenol if needed.

## 2021-08-27 ENCOUNTER — Encounter (HOSPITAL_COMMUNITY): Payer: Self-pay | Admitting: Vascular Surgery

## 2021-08-27 ENCOUNTER — Ambulatory Visit (HOSPITAL_COMMUNITY): Payer: No Typology Code available for payment source | Admitting: Anesthesiology

## 2021-08-27 ENCOUNTER — Other Ambulatory Visit: Payer: Self-pay

## 2021-08-27 ENCOUNTER — Encounter (HOSPITAL_COMMUNITY)
Admission: RE | Disposition: A | Discharge status: Home / Self Care | Payer: Self-pay | Source: Home / Self Care | Attending: Vascular Surgery

## 2021-08-27 ENCOUNTER — Ambulatory Visit (HOSPITAL_COMMUNITY)
Admission: RE | Admit: 2021-08-27 | Discharge: 2021-08-27 | Disposition: A | Discharge status: Home / Self Care | Payer: No Typology Code available for payment source | Attending: Vascular Surgery | Admitting: Vascular Surgery

## 2021-08-27 ENCOUNTER — Ambulatory Visit (HOSPITAL_COMMUNITY): Payer: No Typology Code available for payment source

## 2021-08-27 DIAGNOSIS — Z419 Encounter for procedure for purposes other than remedying health state, unspecified: Secondary | ICD-10-CM

## 2021-08-27 DIAGNOSIS — Z885 Allergy status to narcotic agent status: Secondary | ICD-10-CM | POA: Insufficient documentation

## 2021-08-27 DIAGNOSIS — Z887 Allergy status to serum and vaccine status: Secondary | ICD-10-CM | POA: Insufficient documentation

## 2021-08-27 DIAGNOSIS — Z992 Dependence on renal dialysis: Secondary | ICD-10-CM

## 2021-08-27 DIAGNOSIS — E1122 Type 2 diabetes mellitus with diabetic chronic kidney disease: Secondary | ICD-10-CM | POA: Insufficient documentation

## 2021-08-27 DIAGNOSIS — T82898A Other specified complication of vascular prosthetic devices, implants and grafts, initial encounter: Secondary | ICD-10-CM

## 2021-08-27 DIAGNOSIS — N186 End stage renal disease: Secondary | ICD-10-CM

## 2021-08-27 DIAGNOSIS — Z95828 Presence of other vascular implants and grafts: Secondary | ICD-10-CM

## 2021-08-27 DIAGNOSIS — I12 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 disease: Secondary | ICD-10-CM | POA: Insufficient documentation

## 2021-08-27 DIAGNOSIS — Z79899 Other long term (current) drug therapy: Secondary | ICD-10-CM | POA: Insufficient documentation

## 2021-08-27 DIAGNOSIS — Z888 Allergy status to other drugs, medicaments and biological substances status: Secondary | ICD-10-CM | POA: Insufficient documentation

## 2021-08-27 HISTORY — PX: INSERTION OF DIALYSIS CATHETER: SHX1324

## 2021-08-27 LAB — POCT I-STAT, CHEM 8
BUN: 23 mg/dL — ABNORMAL HIGH (ref 6–20)
Calcium, Ion: 1.26 mmol/L (ref 1.15–1.40)
Chloride: 97 mmol/L — ABNORMAL LOW (ref 98–111)
Creatinine, Ser: 7.2 mg/dL — ABNORMAL HIGH (ref 0.44–1.00)
Glucose, Bld: 81 mg/dL (ref 70–99)
HCT: 27 % — ABNORMAL LOW (ref 36.0–46.0)
Hemoglobin: 9.2 g/dL — ABNORMAL LOW (ref 12.0–15.0)
Potassium: 3.6 mmol/L (ref 3.5–5.1)
Sodium: 137 mmol/L (ref 135–145)
TCO2: 29 mmol/L (ref 22–32)

## 2021-08-27 SURGERY — INSERTION OF DIALYSIS CATHETER
Anesthesia: Monitor Anesthesia Care | Site: Neck | Laterality: Right

## 2021-08-27 MED ORDER — LIDOCAINE HCL (PF) 1 % IJ SOLN
INTRAMUSCULAR | Status: AC
  Filled 2021-08-27: qty 30

## 2021-08-27 MED ORDER — OXYCODONE HCL 5 MG PO TABS
5.0000 mg | ORAL_TABLET | Freq: Once | ORAL | Status: DC | PRN
Start: 2021-08-27 — End: 2021-08-27

## 2021-08-27 MED ORDER — HEPARIN 6000 UNIT IRRIGATION SOLUTION
Status: DC | PRN
  Administered 2021-08-27: 1

## 2021-08-27 MED ORDER — CHLORHEXIDINE GLUCONATE 4 % EX LIQD: 60.0000 mL | Freq: Once | CUTANEOUS | Status: DC

## 2021-08-27 MED ORDER — HEPARIN SODIUM (PORCINE) 1000 UNIT/ML IJ SOLN
INTRAMUSCULAR | Status: AC
  Filled 2021-08-27: qty 1

## 2021-08-27 MED ORDER — ONDANSETRON HCL 4 MG/2ML IJ SOLN
INTRAMUSCULAR | Status: AC
  Filled 2021-08-27: qty 2

## 2021-08-27 MED ORDER — FENTANYL CITRATE (PF) 100 MCG/2ML IJ SOLN
INTRAMUSCULAR | Status: AC
  Filled 2021-08-27: qty 2

## 2021-08-27 MED ORDER — HEPARIN 6000 UNIT IRRIGATION SOLUTION
Status: AC
  Filled 2021-08-27: qty 500

## 2021-08-27 MED ORDER — OXYCODONE HCL 5 MG/5ML PO SOLN: 5.0000 mg | Freq: Once | ORAL | Status: DC | PRN

## 2021-08-27 MED ORDER — LIDOCAINE HCL 1 % IJ SOLN
INTRAMUSCULAR | Status: DC | PRN
  Administered 2021-08-27: 30 mL

## 2021-08-27 MED ORDER — ORAL CARE MOUTH RINSE: 15.0000 mL | Freq: Once | OROMUCOSAL | Status: AC

## 2021-08-27 MED ORDER — CHLORHEXIDINE GLUCONATE 0.12 % MT SOLN: 15.0000 mL | Freq: Once | OROMUCOSAL | Status: AC

## 2021-08-27 MED ORDER — PROMETHAZINE HCL 25 MG/ML IJ SOLN: 6.2500 mg | INTRAMUSCULAR | Status: DC | PRN

## 2021-08-27 MED ORDER — CEFAZOLIN SODIUM-DEXTROSE 2-4 GM/100ML-% IV SOLN
2.0000 g | INTRAVENOUS | Status: AC
  Administered 2021-08-27: 2 g via INTRAVENOUS
  Filled 2021-08-27: qty 100

## 2021-08-27 MED ORDER — PHENYLEPHRINE HCL-NACL 20-0.9 MG/250ML-% IV SOLN
INTRAVENOUS | Status: DC | PRN
  Administered 2021-08-27: 20 ug/min via INTRAVENOUS

## 2021-08-27 MED ORDER — FENTANYL CITRATE (PF) 100 MCG/2ML IJ SOLN
25.0000 ug | INTRAMUSCULAR | Status: DC | PRN
  Administered 2021-08-27 (×3): 25 ug via INTRAVENOUS

## 2021-08-27 MED ORDER — PROPOFOL 500 MG/50ML IV EMUL
INTRAVENOUS | Status: DC | PRN
  Administered 2021-08-27: 100 ug/kg/min via INTRAVENOUS

## 2021-08-27 MED ORDER — FENTANYL CITRATE (PF) 250 MCG/5ML IJ SOLN
INTRAMUSCULAR | Status: AC
  Filled 2021-08-27: qty 5

## 2021-08-27 MED ORDER — CHLORHEXIDINE GLUCONATE 0.12 % MT SOLN
OROMUCOSAL | Status: AC
  Administered 2021-08-27: 15 mL via OROMUCOSAL
  Filled 2021-08-27: qty 15

## 2021-08-27 MED ORDER — ACETAMINOPHEN 10 MG/ML IV SOLN
INTRAVENOUS | Status: AC
  Filled 2021-08-27: qty 100

## 2021-08-27 MED ORDER — LACTATED RINGERS IV SOLN: INTRAVENOUS | Status: DC

## 2021-08-27 MED ORDER — DEXAMETHASONE SODIUM PHOSPHATE 10 MG/ML IJ SOLN
INTRAMUSCULAR | Status: AC
  Filled 2021-08-27: qty 1

## 2021-08-27 MED ORDER — SODIUM CHLORIDE 0.9 % IV SOLN: INTRAVENOUS | Status: DC

## 2021-08-27 MED ORDER — ACETAMINOPHEN 10 MG/ML IV SOLN
1000.0000 mg | Freq: Once | INTRAVENOUS | Status: DC | PRN
  Administered 2021-08-27: 1000 mg via INTRAVENOUS

## 2021-08-27 MED ORDER — PROPOFOL 10 MG/ML IV BOLUS
INTRAVENOUS | Status: DC | PRN
  Administered 2021-08-27: 30 mg via INTRAVENOUS

## 2021-08-27 MED ORDER — DEXAMETHASONE SODIUM PHOSPHATE 10 MG/ML IJ SOLN
INTRAMUSCULAR | Status: DC | PRN
  Administered 2021-08-27: 4 mg via INTRAVENOUS

## 2021-08-27 MED ORDER — FENTANYL CITRATE (PF) 100 MCG/2ML IJ SOLN
INTRAMUSCULAR | Status: DC | PRN
  Administered 2021-08-27: 25 ug via INTRAVENOUS

## 2021-08-27 MED ORDER — 0.9 % SODIUM CHLORIDE (POUR BTL) OPTIME
TOPICAL | Status: DC | PRN
  Administered 2021-08-27: 1000 mL

## 2021-08-27 MED ORDER — MIDAZOLAM HCL 5 MG/5ML IJ SOLN
INTRAMUSCULAR | Status: DC | PRN
Start: 2021-08-27 — End: 2021-08-27
  Administered 2021-08-27: 2 mg via INTRAVENOUS

## 2021-08-27 MED ORDER — MIDAZOLAM HCL 2 MG/2ML IJ SOLN
INTRAMUSCULAR | Status: AC
  Filled 2021-08-27: qty 2

## 2021-08-27 MED ORDER — ONDANSETRON HCL 4 MG/2ML IJ SOLN
INTRAMUSCULAR | Status: DC | PRN
  Administered 2021-08-27: 4 mg via INTRAVENOUS

## 2021-08-27 MED ORDER — PROPOFOL 10 MG/ML IV BOLUS
INTRAVENOUS | Status: AC
  Filled 2021-08-27: qty 40

## 2021-08-27 MED ORDER — HEPARIN SODIUM (PORCINE) 1000 UNIT/ML IJ SOLN
INTRAMUSCULAR | Status: DC | PRN
  Administered 2021-08-27: 1000 [IU] via INTRAVENOUS

## 2021-08-27 SURGICAL SUPPLY — 45 items
ADH SKN CLS APL DERMABOND .7 (GAUZE/BANDAGES/DRESSINGS) ×1
BAG COUNTER SPONGE SURGICOUNT (BAG) ×2 IMPLANT
BAG DECANTER FOR FLEXI CONT (MISCELLANEOUS) ×2 IMPLANT
BAG SPNG CNTER NS LX DISP (BAG) ×1
BIOPATCH RED 1 DISK 7.0 (GAUZE/BANDAGES/DRESSINGS) ×2 IMPLANT
CATH PALINDROME-P 19CM W/VT (CATHETERS) ×2 IMPLANT
CATH PALINDROME-P 23CM W/VT (CATHETERS) IMPLANT
CATH PALINDROME-P 28CM W/VT (CATHETERS) IMPLANT
CATH STRAIGHT 5FR 65CM (CATHETERS) IMPLANT
COVER PROBE W GEL 5X96 (DRAPES) ×2 IMPLANT
COVER SURGICAL LIGHT HANDLE (MISCELLANEOUS) ×2 IMPLANT
DECANTER SPIKE VIAL GLASS SM (MISCELLANEOUS) ×2 IMPLANT
DERMABOND ADVANCED (GAUZE/BANDAGES/DRESSINGS) ×1
DERMABOND ADVANCED .7 DNX12 (GAUZE/BANDAGES/DRESSINGS) ×1 IMPLANT
DRAPE C-ARM 42X72 X-RAY (DRAPES) ×2 IMPLANT
DRAPE CHEST BREAST 15X10 FENES (DRAPES) ×2 IMPLANT
GAUZE 4X4 16PLY ~~LOC~~+RFID DBL (SPONGE) ×2 IMPLANT
GLOVE SRG 8 PF TXTR STRL LF DI (GLOVE) ×1 IMPLANT
GLOVE SURG ENC MOIS LTX SZ7.5 (GLOVE) ×2 IMPLANT
GLOVE SURG UNDER POLY LF SZ8 (GLOVE) ×2
GOWN STRL REUS W/ TWL LRG LVL3 (GOWN DISPOSABLE) ×2 IMPLANT
GOWN STRL REUS W/ TWL XL LVL3 (GOWN DISPOSABLE) ×2 IMPLANT
GOWN STRL REUS W/TWL LRG LVL3 (GOWN DISPOSABLE) ×4
GOWN STRL REUS W/TWL XL LVL3 (GOWN DISPOSABLE) ×4
KIT BASIN OR (CUSTOM PROCEDURE TRAY) ×2 IMPLANT
KIT PALINDROME-P 55CM (CATHETERS) IMPLANT
KIT TURNOVER KIT B (KITS) ×2 IMPLANT
NDL HYPO 25GX1X1/2 BEV (NEEDLE) ×1 IMPLANT
NEEDLE 18GX1X1/2 (RX/OR ONLY) (NEEDLE) ×2 IMPLANT
NEEDLE HYPO 25GX1X1/2 BEV (NEEDLE) ×2 IMPLANT
NS IRRIG 1000ML POUR BTL (IV SOLUTION) ×2 IMPLANT
PACK SURGICAL SETUP 50X90 (CUSTOM PROCEDURE TRAY) ×2 IMPLANT
PAD ARMBOARD 7.5X6 YLW CONV (MISCELLANEOUS) ×4 IMPLANT
SET MICROPUNCTURE 5F STIFF (MISCELLANEOUS) IMPLANT
SOAP 2 % CHG 4 OZ (WOUND CARE) ×2 IMPLANT
SUT ETHILON 3 0 PS 1 (SUTURE) ×2 IMPLANT
SUT MNCRL AB 4-0 PS2 18 (SUTURE) ×2 IMPLANT
SYR 10ML LL (SYRINGE) ×2 IMPLANT
SYR 20ML LL LF (SYRINGE) ×4 IMPLANT
SYR 5ML LL (SYRINGE) ×2 IMPLANT
SYR CONTROL 10ML LL (SYRINGE) ×2 IMPLANT
TOWEL GREEN STERILE (TOWEL DISPOSABLE) ×2 IMPLANT
TOWEL GREEN STERILE FF (TOWEL DISPOSABLE) ×2 IMPLANT
WATER STERILE IRR 1000ML POUR (IV SOLUTION) ×2 IMPLANT
WIRE AMPLATZ SS-J .035X180CM (WIRE) IMPLANT

## 2021-08-27 NOTE — Transfer of Care (Signed)
Immediate Anesthesia Transfer of Care Note  Patient: Sabrina Mitchell  Procedure(s) Performed: INSERTION OF 19cm TUNNELED DIALYSIS CATHETER INTO RIGHT NECK (Right: Neck)  Patient Location: PACU  Anesthesia Type:MAC  Level of Consciousness: awake, alert  and oriented  Airway & Oxygen Therapy: Patient Spontanous Breathing  Post-op Assessment: Report given to RN, Post -op Vital signs reviewed and stable and Patient moving all extremities  Post vital signs: Reviewed and stable  Last Vitals:  Vitals Value Taken Time  BP 148/61 08/27/21 1333  Temp    Pulse 61 08/27/21 1338  Resp 11 08/27/21 1338  SpO2 100 % 08/27/21 1338  Vitals shown include unvalidated device data.  Last Pain:  Vitals:   08/27/21 1100  TempSrc: Oral         Complications: No notable events documented.

## 2021-08-27 NOTE — Anesthesia Preprocedure Evaluation (Addendum)
Anesthesia Evaluation  Patient identified by MRN, date of birth, ID band Patient awake    Reviewed: Allergy & Precautions, NPO status , Patient's Chart, lab work & pertinent test results  Airway Mallampati: III  TM Distance: >3 FB Neck ROM: Full    Dental  (+) Lower Dentures, Upper Dentures   Pulmonary neg pulmonary ROS,    Pulmonary exam normal breath sounds clear to auscultation       Cardiovascular hypertension, Pt. on medications and Pt. on home beta blockers Normal cardiovascular exam Rhythm:Regular Rate:Normal  ECG: NSR, rate 87   Neuro/Psych  Headaches, PSYCHIATRIC DISORDERS Anxiety Depression    GI/Hepatic Neg liver ROS, GERD  Medicated and Controlled,  Endo/Other  negative endocrine ROS  Renal/GU ESRF and DialysisRenal disease     Musculoskeletal negative musculoskeletal ROS (+)   Abdominal   Peds  Hematology  (+) anemia ,   Anesthesia Other Findings ESRD  Reproductive/Obstetrics                            Anesthesia Physical Anesthesia Plan  ASA: 3  Anesthesia Plan: MAC   Post-op Pain Management:    Induction: Intravenous  PONV Risk Score and Plan: 2 and Ondansetron, Dexamethasone, Propofol infusion and Treatment may vary due to age or medical condition  Airway Management Planned: Simple Face Mask  Additional Equipment:   Intra-op Plan:   Post-operative Plan:   Informed Consent: I have reviewed the patients History and Physical, chart, labs and discussed the procedure including the risks, benefits and alternatives for the proposed anesthesia with the patient or authorized representative who has indicated his/her understanding and acceptance.     Dental advisory given and Interpreter used for interveiw  Plan Discussed with: CRNA  Anesthesia Plan Comments:         Anesthesia Quick Evaluation

## 2021-08-27 NOTE — H&P (Signed)
H&P     History of Present Illness: This is a 60 y.o. female with end-stage renal disease that presents for placement of tunneled dialysis catheter.  Our triage nurse was contacted yesterday by dialysis that she could not complete full session.  Asked that we place a tunneled catheter today.  Patient states she has been having a lot of pain in her left arm AV fistula.  States she has had a catheter in the right chest before.  Past Medical History:  Diagnosis Date   Anemia    Anxiety    Complication of anesthesia    Depression    denies   End stage renal disease ([place])    hemodialysis 3x/wk @ [facility]   Family history of adverse reaction to anesthesia    sister vomiting after uterine cyst removal   Gastritis    GERD (gastroesophageal reflux disease)    Hemorrhoids    Hypertension    Pneumonia    years ago   PONV (postoperative nausea and vomiting)    UTI (lower urinary tract infection) March 2014    Past Surgical History:  Procedure Laterality Date   AV FISTULA PLACEMENT Left 07/05/09   Dr. Kellie Simmering   COLONOSCOPY N/A 02/10/2016   SLF: 1. HEME postive stool due to colon polyps intrernal hemorrhoids 2. small internal hemrrohoids 3. moderate external hemorrhoids.    ESOPHAGEAL DILATION N/A 02/10/2016   Procedure: ESOPHAGEAL DILATION;  Surgeon: Danie Binder, MD;  [facility];  Service: Endoscopy;  Laterality: N/A;   ESOPHAGOGASTRODUODENOSCOPY N/A 02/10/2016   SLF: patent Schatzki's ring , mild gastritis/ duodentitis.    FISTULA SUPERFICIALIZATION Left 81/12/9145   Procedure: PLICATION BRACHIOCEPHALIC FISTULA;  Surgeon: Angelia Mould, MD;  Location: [place];  Service: Vascular;  Laterality: Left;   FISTULOGRAM N/A 10/22/2014   Procedure: FISTULOGRAM;  Surgeon: Angelia Mould, MD;  Location: [facility] CATH LAB;  Service: Cardiovascular;  Laterality: N/A;   IR DIALY SHUNT INTRO NEEDLE/INTRACATH INITIAL W/IMG LEFT Left 02/01/2019   IR DIALY SHUNT INTRO  NEEDLE/INTRACATH INITIAL W/IMG LEFT Left 06/05/2020   IR DIALY SHUNT INTRO NEEDLE/INTRACATH INITIAL W/IMG LEFT Left 05/09/2021   IR THROMBECTOMY AV FISTULA W/THROMBOLYSIS/PTA INC/SHUNT/IMG LEFT Left 08/18/2021   IR US GUIDE VASC ACCESS LEFT  08/18/2021   PERIPHERAL VASCULAR BALLOON ANGIOPLASTY Left 07/14/2021   Procedure: PERIPHERAL VASCULAR BALLOON ANGIOPLASTY;  Surgeon: Waynetta Sandy, MD;  Location: [place] CV LAB;  Service: Cardiovascular;  Laterality: Left;   PERIPHERAL VASCULAR CATHETERIZATION Left 12/09/2016   Procedure: A/V Fistulagram;  Surgeon: Waynetta Sandy, MD;  Location: [place] CV LAB;  Service: Cardiovascular;  Laterality: Left;   REVISON OF ARTERIOVENOUS FISTULA Left 82/95/6213   Procedure: PLICATION OF BRACHIOCEPHALIC FISTULA;  Surgeon: Conrad DISH, MD;  Location: [place];  Service: Vascular;  Laterality: Left;   REVISON OF ARTERIOVENOUS FISTULA Left 08/09/2017   Procedure: REVISION OF ARTERIOVENOUS FISTULA LIGATION OF SIDE BRANCH;  Surgeon: Waynetta Sandy, MD;  Location: [place];  Service: Vascular;  Laterality: Left;   TUBAL LIGATION  2004    Allergies  Allergen Reactions   Covid-19 (Mrna) Vaccine Swelling    Tongue swelling   Remeron [Mirtazapine] Other (See Comments)    Chest pain   Zoloft [Sertraline]     CP and chills   Amitriptyline Other (See Comments)    Makes her shake   Trazodone And Nefazodone Other (See Comments)    Makes her shake    Prior to Admission medications  Medication Sig Start Date End Date Taking? Authorizing Provider  amLODipine (NORVASC) 5 MG tablet Take 1 tablet by mouth twice a day for high blood pressure Patient taking differently: Take 5 mg by mouth in the morning and at bedtime. 05/22/21  Yes   carvedilol (COREG) 6.25 MG tablet TAKE 1 TABLET BY MOUTH ONCE A DAY AS DIRECTED FOR HIGH BLOOD PRESSURE Patient taking differently: Take 6.25 mg by mouth in the morning. Take additional 6.25 mg in the evening if  blood pressure is high 01/30/21 01/30/22 Yes Penninger, Ria Comment, [place]  ethyl chloride spray APPLY A SMALL AMOUNT OF SPRAY TO DIALYSIS ACCESS JUST BEFORE NEEDLE STICK THREE TIMES A WEEK AS DIRECTED 08/19/21 08/19/22 Yes Lynnda Child, PA-C  ferric citrate (AURYXIA) 1 GM 210 MG(Fe) tablet Take 420-630 mg by mouth 3 (three) times daily with meals.   Yes [provider]  zolpidem (AMBIEN) 5 MG tablet Take 1 tablet (5 mg total) by mouth at bedtime as needed for sleep. 07/02/21  Yes Ladell Pier, MD  acetaminophen (TYLENOL) 500 MG tablet Take 500-1,000 mg by mouth every 6 (six) hours as needed for moderate pain or headache.    [provider]  albuterol (VENTOLIN HFA) 108 (90 Base) MCG/ACT inhaler INHALE 2 PUFF USING INHALER EVERY FOUR TO SIX HOURS WHILE AWAKE AS NEEDED FOR WHEEZING, COUGH, OR SHORTNESS OF BREATH 02/20/21 02/20/22  Loren Racer, PA-C  B Complex-C-Folic Acid (DIALYVITE 030) 0.8 MG TABS Take 1 tablet by mouth daily in the afternoon. 09/06/19   [provider]  ethyl chloride spray Spray 1 as directed to skin three times a week as directed apply a small amount of spray to dialysis access just before needle stick Patient not taking: Reported on 08/26/2021 08/19/21     nystatin-triamcinolone ointment (MYCOLOG) Apply 1 application topically 2 (two) times daily. Patient taking differently: Apply 1 application topically 2 (two) times daily as needed (skin irritation/itching  (yeast)). 05/28/21   Mayers, Cari S, PA-C  Omega-3 Fatty Acids (OMEGA 3 500) 500 MG CAPS Take 500 mg by mouth daily as needed (burning in arm (due to dialysis)).    [provider]  omeprazole (PRILOSEC) 20 MG capsule Take 1 capsule (20 mg total) by mouth daily. 1 PO 30 mins prior to breakfast and supper Patient taking differently: Take 20 mg by mouth daily before breakfast. 08/06/17   Ladell Pier, MD  sucroferric oxyhydroxide (VELPHORO) 500 MG chewable tablet Chew 500-1,000 mg by  mouth See admin instructions. Take 1 tablet (500 mg) by mouth with each snack & take 1-2 tablets (500 mg-1000 mg) by mouth with each meal 11/07/19   [provider]  Vitamin D, Ergocalciferol, (DRISDOL) 1.25 MG (50000 UNIT) CAPS capsule Take 1 capsule (50,000 Units total) by mouth every 7 (seven) days. 06/21/21   Ladell Pier, MD    Social History   Socioeconomic History   Marital status: Significant Other    Spouse name: Not on file   Number of children: 2   Years of education: Not on file   Highest education level: Not on file  Occupational History   Not on file  Tobacco Use   Smoking status: Never   Smokeless tobacco: Never  Vaping Use   Vaping Use: Never used  Substance and Sexual Activity   Alcohol use: No   Drug use: No   Sexual activity: Not Currently    Birth control/protection: Surgical  Other Topics Concern   Not on file  Social History Narrative   Not on file   Social Determinants of Health   Financial Resource Strain: Not on file  Food Insecurity: Not on file  Transportation Needs: Not on file  Physical Activity: Not on file  Stress: Not on file  Social Connections: Not on file  Intimate Partner Violence: Not on file     Family History  Problem Relation Age of Onset   Hypertension Mother    Hypertension Father    Hypertension Sister    Breast cancer Sister 61   Diabetes Brother    Hypertension Brother     ROS: [x]  Positive   [ ]  Negative   [ ]  All sytems reviewed and are negative  Cardiovascular: []  chest pain/pressure []  palpitations []  SOB lying flat []  DOE []  pain in legs while walking []  pain in legs at rest []  pain in legs at night []  non-healing ulcers []  hx of DVT []  swelling in legs  Pulmonary: []  productive cough []  asthma/wheezing []  home O2  Neurologic: []  weakness in []  arms []  legs []  numbness in []  arms []  legs []  hx of CVA []  mini stroke [] difficulty speaking or slurred speech []  temporary loss of  vision in one eye []  dizziness  Hematologic: []  hx of cancer []  bleeding problems []  problems with blood clotting easily  Endocrine:   []  diabetes []  thyroid disease  GI []  vomiting blood []  blood in stool  GU: []  CKD/renal failure []  HD--[]  M/W/F or []  T/T/S []  burning with urination []  blood in urine  Psychiatric: []  anxiety []  depression  Musculoskeletal: []  arthritis []  joint pain  Integumentary: []  rashes []  ulcers  Constitutional: []  fever []  chills   Physical Examination  Vitals:   08/27/21 1100  BP: (!) 154/73  Pulse: 71  Resp: 17  Temp: 98.6 F (37 C)  SpO2: 100%   Body mass index is 25.25 kg/m.  General:  WDWN in NAD Gait: Not observed HENT: WNL, normocephalic Pulmonary: normal non-labored breathing, without Rales, rhonchi,  wheezing Cardiac: regular, without  Murmurs, rubs or gallops Vascular Exam/Pulses: Left arm AV fistula with appreciable thrill CBC    Component Value Date/Time   WBC 5.0 08/17/2021 1243   RBC 2.33 (L) 08/17/2021 1243   HGB 7.2 (L) 08/17/2021 1243   HGB 10.1 (L) 03/12/2021 0954   HCT 21.5 (L) 08/17/2021 1243   HCT 30.6 (L) 03/12/2021 0954   PLT 137 (L) 08/17/2021 1243   PLT 232 03/12/2021 0954   MCV 92.3 08/17/2021 1243   MCV 96 03/12/2021 0954   MCH 30.9 08/17/2021 1243   MCHC 33.5 08/17/2021 1243   RDW 13.1 08/17/2021 1243   RDW 14.3 03/12/2021 0954   LYMPHSABS 1.1 08/17/2021 1243   LYMPHSABS 1.1 03/12/2021 0954   MONOABS 0.3 08/17/2021 1243   EOSABS 0.0 08/17/2021 1243   EOSABS 0.1 03/12/2021 0954   BASOSABS 0.0 08/17/2021 1243   BASOSABS 0.0 03/12/2021 0954    BMET    Component Value Date/Time   NA 136 08/17/2021 1243   NA 140 03/27/2020 1347   K 3.8 08/17/2021 1243   CL 98 08/17/2021 1243   CO2 24 08/17/2021 1243   GLUCOSE 84 08/17/2021 1243   BUN 74 (H) 08/17/2021 1243   BUN 46 (H) 03/27/2020 1347   CREATININE 10.16 (H) 08/17/2021 1243   CALCIUM 9.6 08/17/2021 1243   GFRNONAA 4 (L)  08/17/2021 1243   GFRAA 7 (L) 03/27/2020 1347    COAGS: No results found for:  INR, PROTIME   Non-Invasive Vascular Imaging:    None   ASSESSMENT/PLAN: This is a 60 y.o. female with end-stage renal disease who presents with malfunctioning left arm AV fistula.  Our triage nurse was contacted yesterday in the office for placement of a tunneled dialysis catheter.  I discussed steps of the procedure with her today with ultrasound-guided access of either the right or left IJ for placement of a tunneled catheter.  All questions answered with interpretor.    Marty Heck, MD [facility] Office: [place]

## 2021-08-27 NOTE — Op Note (Signed)
    OPERATIVE NOTE  PROCEDURE: Right internal jugular vein cannulation under ultrasound guidance 2.  Right internal jugular vein tunneled dialysis catheter placement (19 cm Palindrome)  PRE-OPERATIVE DIAGNOSIS: end-stage renal failure  POST-OPERATIVE DIAGNOSIS: same as above  SURGEON: Marty Heck, MD  ANESTHESIA: MAC  ESTIMATED BLOOD LOSS: 30 cc  FINDING(S): 1.  Tips of the catheter in the right atrium on fluoroscopy 2.  No obvious pneumothorax on fluoroscopy  SPECIMEN(S):  none  INDICATIONS:   Sabrina Mitchell is a 60 y.o. female who presents with end stage renal disease and malfunctioning left arm AVF.  She is scheduled for tunneled dialysis catheter placement.  The patient is aware the risks of tunneled dialysis catheter placement include but are not limited to: bleeding, infection, central venous injury, pneumothorax, possible venous stenosis, possible malpositioning in the venous system, and possible infections related to long-term catheter presence.  The patient was aware of these risks and agreed to proceed.  DESCRIPTION: After written full informed consent was obtained from the patient, the patient was taken back to the operating room.  Prior to induction, the patient was given IV antibiotics.  After obtaining adequate sedation, the patient was prepped and draped in the standard fashion for a chest or neck tunneled dialysis catheter placement.  The right neck cannulation site, the catheter exit site, and tract for the subcutaneous tunnel were then anesthestized with a total of 10 mL of 1% lidocaine without epinephrine.  Under ultrasound guidance, the right internal jugular vein was cannulated with the 18 gauge needle.  A J-wire was then placed down into the right ventricle under fluoroscopic guidance.  The wire was then secured in place with a clamp to the drapes.  I then made stab incisions at the neck and exit sites.   I dissected from the exit site to the  cannulation site with the catheter.   The wire was then unclamped and I removed the needle.  The skin tract and venotomy was dilated serially with dilators.  Finally, the dilator-sheath was placed under fluoroscopic guidance into the superior vena cava.  The dilator and wire were removed.  A 19 cm Diatek catheter was placed under fluoroscopic guidance down into the right atrium.  The sheath was broken and peeled away while holding the catheter cuff at the level of the skin.  Each port was tested by aspirating and flushing.  No resistance was noted.  Each port was then thoroughly flushed with heparinized saline.  The catheter was secured in placed with two interrupted stitches of 3-0 Nylon tied to the catheter.  The neck incision was closed with a U-stitch of 4-0 Monocryl.  The neck and chest incision were cleaned and dermabond applied.  Each port was then loaded with concentrated heparin (1000 Units/mL) at the manufacturer recommended volumes to each port.  Sterile caps were applied to each port.    Taken to recovery in stable condition.  COMPLICATIONS: None  CONDITION: Stable  Marty Heck, MD [facility] Office: [place]    08/27/2021, 1:37 PM

## 2021-08-27 NOTE — Anesthesia Postprocedure Evaluation (Signed)
Anesthesia Post Note  Patient: ZAYLI VILLAFUERTE  Procedure(s) Performed: INSERTION OF 19cm TUNNELED DIALYSIS CATHETER INTO RIGHT NECK (Right: Neck)     Patient location during evaluation: PACU Anesthesia Type: MAC Level of consciousness: awake Pain management: pain level controlled Vital Signs Assessment: post-procedure vital signs reviewed and stable Respiratory status: spontaneous breathing, nonlabored ventilation, respiratory function stable and patient connected to nasal cannula oxygen Cardiovascular status: stable and blood pressure returned to baseline Postop Assessment: no apparent nausea or vomiting Anesthetic complications: no   No notable events documented.  Last Vitals:  Vitals:   08/27/21 1348 08/27/21 1403  BP: (!) 160/59 (!) 169/62  Pulse: 65 62  Resp: 13 17  Temp:    SpO2: 99% 98%    Last Pain:  Vitals:   08/27/21 1403  TempSrc:   PainSc: 9                  Jakalyn Kratky P Wayden Schwertner

## 2021-08-28 ENCOUNTER — Encounter (HOSPITAL_COMMUNITY): Payer: Self-pay | Admitting: Vascular Surgery

## 2021-09-05 ENCOUNTER — Other Ambulatory Visit: Payer: Self-pay

## 2021-09-05 DIAGNOSIS — N186 End stage renal disease: Secondary | ICD-10-CM

## 2021-09-09 ENCOUNTER — Other Ambulatory Visit: Payer: Self-pay

## 2021-09-09 MED ORDER — NYSTATIN-TRIAMCINOLONE 100000-0.1 UNIT/GM-% EX OINT
TOPICAL_OINTMENT | CUTANEOUS | 6 refills | Status: DC
  Filled 2021-09-09: qty 30, 10d supply, fill #0

## 2021-09-10 ENCOUNTER — Encounter: Payer: No Typology Code available for payment source | Admitting: Vascular Surgery

## 2021-09-10 ENCOUNTER — Other Ambulatory Visit: Payer: Self-pay

## 2021-09-10 ENCOUNTER — Encounter (HOSPITAL_COMMUNITY): Payer: No Typology Code available for payment source

## 2021-09-10 ENCOUNTER — Other Ambulatory Visit: Payer: Self-pay | Admitting: *Deleted

## 2021-09-10 DIAGNOSIS — N186 End stage renal disease: Secondary | ICD-10-CM

## 2021-09-11 ENCOUNTER — Ambulatory Visit (HOSPITAL_COMMUNITY)
Admission: RE | Admit: 2021-09-11 | Discharge: 2021-09-11 | Disposition: A | Discharge status: Home / Self Care | Payer: Self-pay | Source: Ambulatory Visit | Attending: Vascular Surgery | Admitting: Vascular Surgery

## 2021-09-11 ENCOUNTER — Other Ambulatory Visit: Payer: Self-pay

## 2021-09-11 ENCOUNTER — Ambulatory Visit (HOSPITAL_COMMUNITY)
Admission: RE | Admit: 2021-09-11 | Discharge: 2021-09-11 | Disposition: A | Discharge status: Home / Self Care | Payer: No Typology Code available for payment source | Source: Ambulatory Visit | Attending: Vascular Surgery | Admitting: Vascular Surgery

## 2021-09-11 DIAGNOSIS — Z992 Dependence on renal dialysis: Secondary | ICD-10-CM | POA: Insufficient documentation

## 2021-09-11 DIAGNOSIS — N186 End stage renal disease: Secondary | ICD-10-CM | POA: Insufficient documentation

## 2021-09-17 ENCOUNTER — Other Ambulatory Visit: Payer: Self-pay

## 2021-09-17 ENCOUNTER — Encounter: Payer: Self-pay | Admitting: Vascular Surgery

## 2021-09-17 ENCOUNTER — Ambulatory Visit: Payer: Self-pay | Admitting: Physician Assistant

## 2021-09-17 ENCOUNTER — Ambulatory Visit (INDEPENDENT_AMBULATORY_CARE_PROVIDER_SITE_OTHER): Payer: Self-pay | Admitting: Vascular Surgery

## 2021-09-17 VITALS — BP 172/84 | HR 64 | Temp 98.5°F | Resp 20 | Ht 59.0 in | Wt 125.0 lb

## 2021-09-17 DIAGNOSIS — Z992 Dependence on renal dialysis: Secondary | ICD-10-CM

## 2021-09-17 DIAGNOSIS — N186 End stage renal disease: Secondary | ICD-10-CM

## 2021-09-17 DIAGNOSIS — F5104 Psychophysiologic insomnia: Secondary | ICD-10-CM

## 2021-09-17 MED ORDER — ZOLPIDEM TARTRATE 5 MG PO TABS: 5.0000 mg | ORAL_TABLET | Freq: Every evening | ORAL | 0 refills | Status: DC | PRN

## 2021-09-17 MED FILL — Carvedilol Tab 6.25 MG: ORAL | 30 days supply | Qty: 30 | Fill #3 | Status: AC

## 2021-09-17 NOTE — H&P (View-Only) (Signed)
Patient ID: [name], female   DOB: 1961-05-17, 60 y.o.   MRN: [id number]  Reason for Consult: Routine Post Op   Referred by Ladell Pier, MD  Subjective:     HPI:  Sabrina Mitchell is a 60 y.o. female with end-stage renal disease currently dialyzing via catheter.  She does have a functional fistula which I have intervened upon recently but this causes her significant pain.  She states that she cannot take cannulation any longer in the left arm and she is here to discuss permanent access.  She currently dialyzes Tuesdays, Thursdays and Saturdays.  She is not on any blood thinners.  Patient is right-hand dominant.  She has never had right arm, chest or breast surgery.  All history obtained via interpreter  Past Medical History:  Diagnosis Date   Anemia    Anxiety    Complication of anesthesia    Depression    denies   End stage renal disease ([place])    hemodialysis 3x/wk @ [place]   Family history of adverse reaction to anesthesia    sister vomiting after uterine cyst removal   Gastritis    GERD (gastroesophageal reflux disease)    Hemorrhoids    Hypertension    Pneumonia    years ago   PONV (postoperative nausea and vomiting)    UTI (lower urinary tract infection) March 2014   Family History  Problem Relation Age of Onset   Hypertension Mother    Hypertension Father    Hypertension Sister    Breast cancer Sister 30   Diabetes Brother    Hypertension Brother    Past Surgical History:  Procedure Laterality Date   AV FISTULA PLACEMENT Left 07/05/09   Dr. Kellie Simmering   COLONOSCOPY N/A 02/10/2016   SLF: 1. HEME postive stool due to colon polyps intrernal hemorrhoids 2. small internal hemrrohoids 3. moderate external hemorrhoids.    ESOPHAGEAL DILATION N/A 02/10/2016   Procedure: ESOPHAGEAL DILATION;  Surgeon: Danie Binder, MD;  Location: [facility];  Service: Endoscopy;  Laterality: N/A;   ESOPHAGOGASTRODUODENOSCOPY N/A 02/10/2016    SLF: patent Schatzki's ring , mild gastritis/ duodentitis.    FISTULA SUPERFICIALIZATION Left 01/03/7938   Procedure: PLICATION BRACHIOCEPHALIC FISTULA;  Surgeon: Angelia Mould, MD;  Location: [place];  Service: Vascular;  Laterality: Left;   FISTULOGRAM N/A 10/22/2014   Procedure: FISTULOGRAM;  Surgeon: Angelia Mould, MD;  Location: [facility] C[facility] LAB;  Service: Cardiovascular;  Laterality: N/A;   INSERTION OF DIALYSIS CATHETER Right 08/27/2021   Procedure: INSERTION OF 19cm TUNNELED DIALYSIS CATHETER INTO RIGHT NECK;  Surgeon: Marty Heck, MD;  Location: [place];  Service: Vascular;  Laterality: Right;   IR DIALY SHUNT INTRO NEEDLE/INTRACATH INITIAL W/IMG LEFT Left 02/01/2019   IR DIALY SHUNT INTRO NEEDLE/INTRACATH INITIAL W/IMG LEFT Left 06/05/2020   IR DIALY SHUNT INTRO NEEDLE/INTRACATH INITIAL W/IMG LEFT Left 05/09/2021   IR THROMBECTOMY AV FISTULA W/THROMBOLYSIS/PTA INC/SHUNT/IMG LEFT Left 08/18/2021   IR US GUIDE VASC ACCESS LEFT  08/18/2021   PERIPHERAL VASCULAR BALLOON ANGIOPLASTY Left 07/14/2021   Procedure: PERIPHERAL VASCULAR BALLOON ANGIOPLASTY;  Surgeon: Waynetta Sandy, MD;  Location: [place] CV LAB;  Service: Cardiovascular;  Laterality: Left;   PERIPHERAL VASCULAR CATHETERIZATION Left 12/09/2016   Procedure: A/V Fistulagram;  Surgeon: Waynetta Sandy, MD;  Location: [place] CV LAB;  Service: Cardiovascular;  Laterality: Left;   REVISON OF ARTERIOVENOUS FISTULA Left 03/00/9233   Procedure: PLICATION OF BRACHIOCEPHALIC FISTULA;  Surgeon: Conrad Standish, MD;  Location: [place];  Service: Vascular;  Laterality: Left;   REVISON OF ARTERIOVENOUS FISTULA Left 08/09/2017   Procedure: REVISION OF ARTERIOVENOUS FISTULA LIGATION OF SIDE BRANCH;  Surgeon: Waynetta Sandy, MD;  Location: [place];  Service: Vascular;  Laterality: Left;   TUBAL LIGATION  2004    Short Social History:  Social History   Tobacco Use   Smoking status: Never   Smokeless  tobacco: Never  Substance Use Topics   Alcohol use: No    Allergies  Allergen Reactions   Covid-19 (Mrna) Vaccine Swelling    Tongue swelling   Remeron [Mirtazapine] Other (See Comments)    Chest pain   Zoloft [Sertraline]     CP and chills   Amitriptyline Other (See Comments)    Makes her shake   Trazodone And Nefazodone Other (See Comments)    Makes her shake    Current Outpatient Medications  Medication Sig Dispense Refill   acetaminophen (TYLENOL) 500 MG tablet Take 500-1,000 mg by mouth every 6 (six) hours as needed for moderate pain or headache.     albuterol (VENTOLIN HFA) 108 (90 Base) MCG/ACT inhaler INHALE 2 PUFF USING INHALER EVERY FOUR TO SIX HOURS WHILE AWAKE AS NEEDED FOR WHEEZING, COUGH, OR SHORTNESS OF BREATH 18 g 1   amLODipine (NORVASC) 5 MG tablet Take 1 tablet by mouth twice a day for high blood pressure (Patient taking differently: Take 5 mg by mouth in the morning and at bedtime.) 180 tablet 3   B Complex-C-Folic Acid (DIALYVITE 650) 0.8 MG TABS Take 1 tablet by mouth daily in the afternoon.     carvedilol (COREG) 6.25 MG tablet TAKE 1 TABLET BY MOUTH ONCE A DAY AS DIRECTED FOR HIGH BLOOD PRESSURE (Patient taking differently: Take 6.25 mg by mouth in the morning. Take additional 6.25 mg in the evening if blood pressure is high) 90 tablet 3   ethyl chloride spray APPLY A SMALL AMOUNT OF SPRAY TO DIALYSIS ACCESS JUST BEFORE NEEDLE STICK THREE TIMES A WEEK AS DIRECTED 348 mL 3   ferric citrate (AURYXIA) 1 GM 210 MG(Fe) tablet Take 420-630 mg by mouth 3 (three) times daily with meals.     nystatin-triamcinolone ointment (MYCOLOG) Apply 1 application topically 2 (two) times daily. (Patient taking differently: Apply 1 application topically 2 (two) times daily as needed (skin irritation/itching  (yeast)).) 30 g 0   nystatin-triamcinolone ointment (MYCOLOG) Apply 1 a small amount to skin twice a day as needed 30 g 6   Omega-3 Fatty Acids (OMEGA 3 500) 500 MG CAPS Take 500  mg by mouth daily as needed (burning in arm (due to dialysis)).     omeprazole (PRILOSEC) 20 MG capsule Take 1 capsule (20 mg total) by mouth daily. 1 PO 30 mins prior to breakfast and supper (Patient taking differently: Take 20 mg by mouth daily before breakfast.) 30 capsule 3   sucroferric oxyhydroxide (VELPHORO) 500 MG chewable tablet Chew 500-1,000 mg by mouth See admin instructions. Take 1 tablet (500 mg) by mouth with each snack & take 1-2 tablets (500 mg-1000 mg) by mouth with each meal     Vitamin D, Ergocalciferol, (DRISDOL) 1.25 MG (50000 UNIT) CAPS capsule Take 1 capsule (50,000 Units total) by mouth every 7 (seven) days. 12 capsule 1   zolpidem (AMBIEN) 5 MG tablet Take 1 tablet (5 mg total) by mouth at bedtime as needed for sleep. 30 tablet 1   ethyl chloride spray Spray 1 as  directed to skin three times a week as directed apply a small amount of spray to dialysis access just before needle stick (Patient not taking: No sig reported) 116 mL 3   No current facility-administered medications for this visit.    Review of Systems  Constitutional:  Constitutional negative. HENT: HENT negative.  Eyes: Eyes negative.  Respiratory: Respiratory negative.  Cardiovascular: Cardiovascular negative.  GI: Gastrointestinal negative.  Musculoskeletal: Musculoskeletal negative.  Skin: Skin negative.  Neurological: Neurological negative. Hematologic: Hematologic/lymphatic negative.  Psychiatric: Psychiatric negative.       Objective:  Objective   Vitals:   09/17/21 0907  BP: (!) 172/84  Pulse: 64  Resp: 20  Temp: 98.5 F (36.9 C)  SpO2: 98%  Weight: 125 lb (56.7 kg)  Height: 4\' 11"  (1.499 m)   Body mass index is 25.25 kg/m.  Physical Exam HENT:     Head: Normocephalic.     Nose:     Comments: Wearing a mask Eyes:     Pupils: Pupils are equal, round, and reactive to light.  Cardiovascular:     Rate and Rhythm: Normal rate.     Pulses:          Radial pulses are 2+ on the  right side and 2+ on the left side.  Pulmonary:     Effort: Pulmonary effort is normal.  Abdominal:     General: Abdomen is flat.     Palpations: Abdomen is soft.  Musculoskeletal:        General: Normal range of motion.     Cervical back: Normal range of motion.     Comments: Strong thrill in left upper arm AV fistula  Skin:    Capillary Refill: Capillary refill takes less than 2 seconds.  Neurological:     General: No focal deficit present.     Mental Status: She is alert.  Psychiatric:        Mood and Affect: Mood normal.        Thought Content: Thought content normal.    Data: Right Pre-Dialysis Findings:  +-----------------------+----------+--------------------+---------+--------  +  Location               PSV (cm/s)Intralum. Diam. (cm)Waveform  Comments  +-----------------------+----------+--------------------+---------+--------  +  Brachial Antecub. fossa59        0.47                triphasic            +-----------------------+----------+--------------------+---------+--------  +  Radial Art at Wrist    50        0.19                triphasic            +-----------------------+----------+--------------------+---------+--------  +  Ulnar Art at Wrist     47        0.20                triphasic            +-----------------------+----------+--------------------+---------+--------    Left Pre-Dialysis Findings:  +-----------------------+----------+--------------------+---------+--------  +  Location               PSV (cm/s)Intralum. Diam. (cm)Waveform  Comments  +-----------------------+----------+--------------------+---------+--------  +  Brachial Antecub. fossa          0.50                triphasicAVF        +-----------------------+----------+--------------------+---------+--------  +  Radial Art at Wrist  49        0.20                triphasic             +-----------------------+----------+--------------------+---------+--------  +  Ulnar Art at Wrist     43        0.16                triphasic            +-----------------------+----------+--------------------+---------+--------  +-----------------+-------------+----------+---------+  Right Cephalic   Diameter (cm)Depth (cm)Findings   +-----------------+-------------+----------+---------+  Shoulder             0.18                          +-----------------+-------------+----------+---------+  Prox upper arm       0.11                          +-----------------+-------------+----------+---------+  Mid upper arm        0.14                          +-----------------+-------------+----------+---------+  Dist upper arm       0.21               branching  +-----------------+-------------+----------+---------+  Antecubital fossa    0.36                          +-----------------+-------------+----------+---------+  Prox forearm         0.12                          +-----------------+-------------+----------+---------+  Mid forearm          0.10                          +-----------------+-------------+----------+---------+  Dist forearm         0.09                          +-----------------+-------------+----------+---------+   +-----------------+-------------+----------+--------+  Right Basilic    Diameter (cm)Depth (cm)Findings  +-----------------+-------------+----------+--------+  Prox upper arm    0.39 / 0.20                     +-----------------+-------------+----------+--------+  Mid upper arm        0.20                         +-----------------+-------------+----------+--------+  Dist upper arm       0.20                         +-----------------+-------------+----------+--------+  Antecubital fossa    0.19                         +-----------------+-------------+----------+--------+   Prox forearm      0.18 / 0.15                     +-----------------+-------------+----------+--------+   +-----------------+-------------+----------+--------+  Left Cephalic    Diameter (cm)Depth (cm)Findings  +-----------------+-------------+----------+--------+  Prox upper arm  AVF     +-----------------+-------------+----------+--------+  Mid upper arm                             AVF     +-----------------+-------------+----------+--------+  Dist upper arm                            AVF     +-----------------+-------------+----------+--------+  Antecubital fossa                         AVF     +-----------------+-------------+----------+--------+  Prox forearm         0.15                         +-----------------+-------------+----------+--------+  Mid forearm          0.13                         +-----------------+-------------+----------+--------+  Dist forearm         0.19                         +-----------------+-------------+----------+--------+   +-----------------+-------------+----------+--------+  Left Basilic     Diameter (cm)Depth (cm)Findings  +-----------------+-------------+----------+--------+  Prox upper arm       0.54                         +-----------------+-------------+----------+--------+  Mid upper arm     0.35 / 0.24            joins    +-----------------+-------------+----------+--------+  Dist upper arm    0.29 / 0.32                     +-----------------+-------------+----------+--------+  Antecubital fossa 0.30 / 0.24                     +-----------------+-------------+----------+--------+  Prox forearm         0.23                         +-----------------+-------------+----------+--------+       Assessment/Plan:    60 year old female with end-stage renal disease.  She has significant pain in her left arm AV fistula and they are no  longer using this.  She is also had some issues with bleeding.  I discussed with her either revising that to a graft to the deep system or using the right upper extremity.  At this time she wants to plan right arm AV fistula versus graft.  We discussed the risk benefits alternatives.  All of this was done via interpreter.  We will schedule her for a nondialysis day in the near future.    Waynetta Sandy MD [facility]

## 2021-09-17 NOTE — Progress Notes (Signed)
Patient ID: Sabrina Mitchell, female   DOB: 1961/07/11, 60 y.o.   MRN: [id number]  Reason for Consult: Routine Post Op   Referred by Ladell Pier, MD  Subjective:     HPI:  Sabrina Mitchell is a 60 y.o. female with end-stage renal disease currently dialyzing via catheter.  She does have a functional fistula which I have intervened upon recently but this causes her significant pain.  She states that she cannot take cannulation any longer in the left arm and she is here to discuss permanent access.  She currently dialyzes Tuesdays, Thursdays and Saturdays.  She is not on any blood thinners.  Patient is right-hand dominant.  She has never had right arm, chest or breast surgery.  All history obtained via interpreter  Past Medical History:  Diagnosis Date   Anemia    Anxiety    Complication of anesthesia    Depression    denies   End stage renal disease ([place])    hemodialysis 3x/wk @ [place]   Family history of adverse reaction to anesthesia    sister vomiting after uterine cyst removal   Gastritis    GERD (gastroesophageal reflux disease)    Hemorrhoids    Hypertension    Pneumonia    years ago   PONV (postoperative nausea and vomiting)    UTI (lower urinary tract infection) March 2014   Family History  Problem Relation Age of Onset   Hypertension Mother    Hypertension Father    Hypertension Sister    Breast cancer Sister 97   Diabetes Brother    Hypertension Brother    Past Surgical History:  Procedure Laterality Date   AV FISTULA PLACEMENT Left 07/05/09   Dr. Kellie Simmering   COLONOSCOPY N/A 02/10/2016   SLF: 1. HEME postive stool due to colon polyps intrernal hemorrhoids 2. small internal hemrrohoids 3. moderate external hemorrhoids.    ESOPHAGEAL DILATION N/A 02/10/2016   Procedure: ESOPHAGEAL DILATION;  Surgeon: Danie Binder, MD;  [facility];  Service: Endoscopy;  Laterality: N/A;   ESOPHAGOGASTRODUODENOSCOPY N/A 02/10/2016    SLF: patent Schatzki's ring , mild gastritis/ duodentitis.    FISTULA SUPERFICIALIZATION Left 51/07/8415   Procedure: PLICATION BRACHIOCEPHALIC FISTULA;  Surgeon: Angelia Mould, MD;  Location: [place];  Service: Vascular;  Laterality: Left;   FISTULOGRAM N/A 10/22/2014   Procedure: FISTULOGRAM;  Surgeon: Angelia Mould, MD;  Location: [facility] CATH LAB;  Service: Cardiovascular;  Laterality: N/A;   INSERTION OF DIALYSIS CATHETER Right 08/27/2021   Procedure: INSERTION OF 19cm TUNNELED DIALYSIS CATHETER INTO RIGHT NECK;  Surgeon: Marty Heck, MD;  Location: [place];  Service: Vascular;  Laterality: Right;   IR DIALY SHUNT INTRO NEEDLE/INTRACATH INITIAL W/IMG LEFT Left 02/01/2019   IR DIALY SHUNT INTRO NEEDLE/INTRACATH INITIAL W/IMG LEFT Left 06/05/2020   IR DIALY SHUNT INTRO NEEDLE/INTRACATH INITIAL W/IMG LEFT Left 05/09/2021   IR THROMBECTOMY AV FISTULA W/THROMBOLYSIS/PTA INC/SHUNT/IMG LEFT Left 08/18/2021   IR US GUIDE VASC ACCESS LEFT  08/18/2021   PERIPHERAL VASCULAR BALLOON ANGIOPLASTY Left 07/14/2021   Procedure: PERIPHERAL VASCULAR BALLOON ANGIOPLASTY;  Surgeon: Waynetta Sandy, MD;  Location: [place] CV LAB;  Service: Cardiovascular;  Laterality: Left;   PERIPHERAL VASCULAR CATHETERIZATION Left 12/09/2016   Procedure: A/V Fistulagram;  Surgeon: Waynetta Sandy, MD;  Location: [place] CV LAB;  Service: Cardiovascular;  Laterality: Left;   REVISON OF ARTERIOVENOUS FISTULA Left 60/63/0160   Procedure: PLICATION OF BRACHIOCEPHALIC FISTULA;  Surgeon: Conrad Irwin, MD;  Location: [place];  Service: Vascular;  Laterality: Left;   REVISON OF ARTERIOVENOUS FISTULA Left 08/09/2017   Procedure: REVISION OF ARTERIOVENOUS FISTULA LIGATION OF SIDE BRANCH;  Surgeon: Waynetta Sandy, MD;  Location: [place];  Service: Vascular;  Laterality: Left;   TUBAL LIGATION  2004    Short Social History:  Social History   Tobacco Use   Smoking status: Never   Smokeless  tobacco: Never  Substance Use Topics   Alcohol use: No    Allergies  Allergen Reactions   Covid-19 ([place]) Vaccine Swelling    Tongue swelling   Remeron [Mirtazapine] Other (See Comments)    Chest pain   Zoloft [Sertraline]     CP and chills   Amitriptyline Other (See Comments)    Makes her shake   Trazodone And Nefazodone Other (See Comments)    Makes her shake    Current Outpatient Medications  Medication Sig Dispense Refill   acetaminophen (TYLENOL) 500 MG tablet Take 500-1,000 mg by mouth every 6 (six) hours as needed for moderate pain or headache.     albuterol (VENTOLIN HFA) 108 (90 Base) MCG/ACT inhaler INHALE 2 PUFF USING INHALER EVERY FOUR TO SIX HOURS WHILE AWAKE AS NEEDED FOR WHEEZING, COUGH, OR SHORTNESS OF BREATH 18 g 1   amLODipine (NORVASC) 5 MG tablet Take 1 tablet by mouth twice a day for high blood pressure (Patient taking differently: Take 5 mg by mouth in the morning and at bedtime.) 180 tablet 3   B Complex-C-Folic Acid (DIALYVITE 973) 0.8 MG TABS Take 1 tablet by mouth daily in the afternoon.     carvedilol (COREG) 6.25 MG tablet TAKE 1 TABLET BY MOUTH ONCE A DAY AS DIRECTED FOR HIGH BLOOD PRESSURE (Patient taking differently: Take 6.25 mg by mouth in the morning. Take additional 6.25 mg in the evening if blood pressure is high) 90 tablet 3   ethyl chloride spray APPLY A SMALL AMOUNT OF SPRAY TO DIALYSIS ACCESS JUST BEFORE NEEDLE STICK THREE TIMES A WEEK AS DIRECTED 348 mL 3   ferric citrate (AURYXIA) 1 GM 210 MG(Fe) tablet Take 420-630 mg by mouth 3 (three) times daily with meals.     nystatin-triamcinolone ointment (MYCOLOG) Apply 1 application topically 2 (two) times daily. (Patient taking differently: Apply 1 application topically 2 (two) times daily as needed (skin irritation/itching  (yeast)).) 30 g 0   nystatin-triamcinolone ointment (MYCOLOG) Apply 1 a small amount to skin twice a day as needed 30 g 6   Omega-3 Fatty Acids (OMEGA 3 500) 500 MG CAPS Take 500  mg by mouth daily as needed (burning in arm (due to dialysis)).     omeprazole (PRILOSEC) 20 MG capsule Take 1 capsule (20 mg total) by mouth daily. 1 PO 30 mins prior to breakfast and supper (Patient taking differently: Take 20 mg by mouth daily before breakfast.) 30 capsule 3   sucroferric oxyhydroxide (VELPHORO) 500 MG chewable tablet Chew 500-1,000 mg by mouth See admin instructions. Take 1 tablet (500 mg) by mouth with each snack & take 1-2 tablets (500 mg-1000 mg) by mouth with each meal     Vitamin D, Ergocalciferol, (DRISDOL) 1.25 MG (50000 UNIT) CAPS capsule Take 1 capsule (50,000 Units total) by mouth every 7 (seven) days. 12 capsule 1   zolpidem (AMBIEN) 5 MG tablet Take 1 tablet (5 mg total) by mouth at bedtime as needed for sleep. 30 tablet 1   ethyl chloride spray Spray 1 as  directed to skin three times a week as directed apply a small amount of spray to dialysis access just before needle stick (Patient not taking: No sig reported) 116 mL 3   No current facility-administered medications for this visit.    Review of Systems  Constitutional:  Constitutional negative. HENT: HENT negative.  Eyes: Eyes negative.  Respiratory: Respiratory negative.  Cardiovascular: Cardiovascular negative.  GI: Gastrointestinal negative.  Musculoskeletal: Musculoskeletal negative.  Skin: Skin negative.  Neurological: Neurological negative. Hematologic: Hematologic/lymphatic negative.  Psychiatric: Psychiatric negative.       Objective:  Objective   Vitals:   09/17/21 0907  BP: (!) 172/84  Pulse: 64  Resp: 20  Temp: 98.5 F (36.9 C)  SpO2: 98%  Weight: 125 lb (56.7 kg)  Height: 4\' 11"  (1.499 m)   Body mass index is 25.25 kg/m.  Physical Exam HENT:     Head: Normocephalic.     Nose:     Comments: Wearing a mask Eyes:     Pupils: Pupils are equal, round, and reactive to light.  Cardiovascular:     Rate and Rhythm: Normal rate.     Pulses:          Radial pulses are 2+ on the  right side and 2+ on the left side.  Pulmonary:     Effort: Pulmonary effort is normal.  Abdominal:     General: Abdomen is flat.     Palpations: Abdomen is soft.  Musculoskeletal:        General: Normal range of motion.     Cervical back: Normal range of motion.     Comments: Strong thrill in left upper arm AV fistula  Skin:    Capillary Refill: Capillary refill takes less than 2 seconds.  Neurological:     General: No focal deficit present.     Mental Status: She is alert.  Psychiatric:        Mood and Affect: Mood normal.        Thought Content: Thought content normal.    Data: Right Pre-Dialysis Findings:  +-----------------------+----------+--------------------+---------+--------  +  Location               PSV (cm/s)Intralum. Diam. (cm)Waveform  Comments  +-----------------------+----------+--------------------+---------+--------  +  Brachial Antecub. fossa59        0.47                triphasic            +-----------------------+----------+--------------------+---------+--------  +  Radial Art at Wrist    50        0.19                triphasic            +-----------------------+----------+--------------------+---------+--------  +  Ulnar Art at Wrist     47        0.20                triphasic            +-----------------------+----------+--------------------+---------+--------    Left Pre-Dialysis Findings:  +-----------------------+----------+--------------------+---------+--------  +  Location               PSV (cm/s)Intralum. Diam. (cm)Waveform  Comments  +-----------------------+----------+--------------------+---------+--------  +  Brachial Antecub. fossa          0.50                triphasicAVF        +-----------------------+----------+--------------------+---------+--------  +  Radial Art at Wrist  49        0.20                triphasic             +-----------------------+----------+--------------------+---------+--------  +  Ulnar Art at Wrist     43        0.16                triphasic            +-----------------------+----------+--------------------+---------+--------  +-----------------+-------------+----------+---------+  Right Cephalic   Diameter (cm)Depth (cm)Findings   +-----------------+-------------+----------+---------+  Shoulder             0.18                          +-----------------+-------------+----------+---------+  Prox upper arm       0.11                          +-----------------+-------------+----------+---------+  Mid upper arm        0.14                          +-----------------+-------------+----------+---------+  Dist upper arm       0.21               branching  +-----------------+-------------+----------+---------+  Antecubital fossa    0.36                          +-----------------+-------------+----------+---------+  Prox forearm         0.12                          +-----------------+-------------+----------+---------+  Mid forearm          0.10                          +-----------------+-------------+----------+---------+  Dist forearm         0.09                          +-----------------+-------------+----------+---------+   +-----------------+-------------+----------+--------+  Right Basilic    Diameter (cm)Depth (cm)Findings  +-----------------+-------------+----------+--------+  Prox upper arm    0.39 / 0.20                     +-----------------+-------------+----------+--------+  Mid upper arm        0.20                         +-----------------+-------------+----------+--------+  Dist upper arm       0.20                         +-----------------+-------------+----------+--------+  Antecubital fossa    0.19                         +-----------------+-------------+----------+--------+   Prox forearm      0.18 / 0.15                     +-----------------+-------------+----------+--------+   +-----------------+-------------+----------+--------+  Left Cephalic    Diameter (cm)Depth (cm)Findings  +-----------------+-------------+----------+--------+  Prox upper arm  AVF     +-----------------+-------------+----------+--------+  Mid upper arm                             AVF     +-----------------+-------------+----------+--------+  Dist upper arm                            AVF     +-----------------+-------------+----------+--------+  Antecubital fossa                         AVF     +-----------------+-------------+----------+--------+  Prox forearm         0.15                         +-----------------+-------------+----------+--------+  Mid forearm          0.13                         +-----------------+-------------+----------+--------+  Dist forearm         0.19                         +-----------------+-------------+----------+--------+   +-----------------+-------------+----------+--------+  Left Basilic     Diameter (cm)Depth (cm)Findings  +-----------------+-------------+----------+--------+  Prox upper arm       0.54                         +-----------------+-------------+----------+--------+  Mid upper arm     0.35 / 0.24            joins    +-----------------+-------------+----------+--------+  Dist upper arm    0.29 / 0.32                     +-----------------+-------------+----------+--------+  Antecubital fossa 0.30 / 0.24                     +-----------------+-------------+----------+--------+  Prox forearm         0.23                         +-----------------+-------------+----------+--------+       Assessment/Plan:    60 year old female with end-stage renal disease.  She has significant pain in her left arm AV fistula and they are no  longer using this.  She is also had some issues with bleeding.  I discussed with her either revising that to a graft to the deep system or using the right upper extremity.  At this time she wants to plan right arm AV fistula versus graft.  We discussed the risk benefits alternatives.  All of this was done via interpreter.  We will schedule her for a nondialysis day in the near future.    Waynetta Sandy MD [facility]

## 2021-09-24 ENCOUNTER — Encounter (HOSPITAL_COMMUNITY): Payer: Self-pay | Admitting: Vascular Surgery

## 2021-09-24 ENCOUNTER — Other Ambulatory Visit: Payer: Self-pay

## 2021-09-24 ENCOUNTER — Other Ambulatory Visit (HOSPITAL_COMMUNITY): Payer: Self-pay

## 2021-09-24 NOTE — Progress Notes (Signed)
Patient was called over the phone and a Spanish interpreter was used to ask questions and proved instructions for surgery day.   PCP - Karle Plumber, MD Cardiologist - denies  PPM/ICD - denies Device Orders - n/a Rep Notified - n/a  Chest x-ray - n/a EKG - 02/24/2021 Stress Test - denies ECHO - denies Cardiac Cath - denies  Sleep Study - denies CPAP - n/a  Fasting Blood Sugar - n/a  Blood Thinner Instructions: n/a Aspirin Instructions: Patient was instructed to stop taking vitamins until after surgery.  Patient was instructed to take the morning of surgery Norvasc, Coreg, Prilosec with a small sip of water. PRN - Tylenol and Albuterol.  ERAS Protcol - n/a  COVID TEST- no, ambulatory surgery  Anesthesia review: yes  Patient denies shortness of breath, fever, cough and chest pain at PAT appointment  Patient was instructed to come for surgery on Friday, 09/26/2021 @ 07:15 o'clock. After midnight she will not eat or drink anything except a small sip of water Friday morning with medicines. Patient was instructed to take shower Thursday night and Friday morning using Dial soap. Patient is aware she will not wear powder, perfume, deodorant, no jewelry, no nail polish.  All instructions explained to the patient, with a verbal understanding of the material. The opportunity to ask questions was provided.

## 2021-09-25 NOTE — Progress Notes (Signed)
Anesthesia Chart Review:  Case: [id number] Date/Time: 09/26/21 0925   Procedure: RIGHT ARM ARTERIOVENOUS (AV) FISTULA VERSUS ARTERIOVENOUS GRAFT CREATION (Right)   Anesthesia type: Choice   Pre-op diagnosis: ESRD   Location: [facility] OR ROOM 11 / [place] OR   Surgeons: Waynetta Sandy, MD       DISCUSSION: Patient is a 60 year old female scheduled for the above procedure. S/p right IJ [facility] 08/27/21.   History includes never smoker, postoperative N/V, HTN, anemia, GERD, ESRD (Left brachial-cephalic AVF 0/0/86 s/p multiple interventions, no longer salvageable).  Spanish-speaking.  She is a same-day work-up.  Anesthesia team to evaluate on the day of surgery.  VS:  BP Readings from Last 3 Encounters:  09/17/21 (!) 172/84  08/27/21 (!) 169/62  08/18/21 (!) 121/59   Pulse Readings from Last 3 Encounters:  09/17/21 64  08/27/21 62  08/18/21 68     PROVIDERS: Ladell Pier, MD is PCP    LABS: For ISTAT on arrival.    IMAGES: 1V PCXR 08/27/21: FINDINGS: - There is a new right-sided dialysis catheter with the tip terminating in the region of the cavoatrial junction. - The heart is enlarged, unchanged. There is no focal consolidation or pulmonary edema. There is no pleural effusion or pneumothorax. - There is no acute osseous abnormality. IMPRESSION: New right-sided dialysis catheter in place with the tip terminating at the cavoatrial junction. No pneumothorax.    EKG: Normal sinus rhythm ST & T wave abnormality, consider inferior ischemia Abnormal ECG Confirmed by Aletta Edouard [phone]) on 02/22/2021 5:56:14 PM   CV: Stress echo 07/04/10: Impressions: LV size was normal.  LV global systolic function was at the lower limits of normal.  Normal wall motion.  No LV regional wall motion abnormalities. Normal study after maximal exercise.     Past Medical History:  Diagnosis Date   Anemia    Anxiety    Complication of anesthesia    Depression    denies   End stage  renal disease ([place])    hemodialysis 3x/wk @ [place]   Family history of adverse reaction to anesthesia    sister vomiting after uterine cyst removal   Gastritis    GERD (gastroesophageal reflux disease)    Hemorrhoids    Hypertension    Pneumonia    years ago   PONV (postoperative nausea and vomiting)    UTI (lower urinary tract infection) March 2014    Past Surgical History:  Procedure Laterality Date   AV FISTULA PLACEMENT Left 07/05/09   Dr. Kellie Simmering   COLONOSCOPY N/A 02/10/2016   SLF: 1. HEME postive stool due to colon polyps intrernal hemorrhoids 2. small internal hemrrohoids 3. moderate external hemorrhoids.    ESOPHAGEAL DILATION N/A 02/10/2016   Procedure: ESOPHAGEAL DILATION;  Surgeon: Danie Binder, MD;  [facility];  Service: Endoscopy;  Laterality: N/A;   ESOPHAGOGASTRODUODENOSCOPY N/A 02/10/2016   SLF: patent Schatzki's ring , mild gastritis/ duodentitis.    FISTULA SUPERFICIALIZATION Left 08/30/2670   Procedure: PLICATION BRACHIOCEPHALIC FISTULA;  Surgeon: Angelia Mould, MD;  Location: [place];  Service: Vascular;  Laterality: Left;   FISTULOGRAM N/A 10/22/2014   Procedure: FISTULOGRAM;  Surgeon: Angelia Mould, MD;  Location: [facility] CATH LAB;  Service: Cardiovascular;  Laterality: N/A;   INSERTION OF DIALYSIS CATHETER Right 08/27/2021   Procedure: INSERTION OF 19cm TUNNELED DIALYSIS CATHETER INTO RIGHT NECK;  Surgeon: Marty Heck, MD;  Location: [place];  Service: Vascular;  Laterality: Right;   IR  DIALY SHUNT INTRO NEEDLE/INTRACATH INITIAL W/IMG LEFT Left 02/01/2019   IR DIALY SHUNT INTRO NEEDLE/INTRACATH INITIAL W/IMG LEFT Left 06/05/2020   IR DIALY SHUNT INTRO NEEDLE/INTRACATH INITIAL W/IMG LEFT Left 05/09/2021   IR THROMBECTOMY AV FISTULA W/THROMBOLYSIS/PTA INC/SHUNT/IMG LEFT Left 08/18/2021   IR US GUIDE VASC ACCESS LEFT  08/18/2021   PERIPHERAL VASCULAR BALLOON ANGIOPLASTY Left 07/14/2021   Procedure: PERIPHERAL VASCULAR BALLOON  ANGIOPLASTY;  Surgeon: Waynetta Sandy, MD;  Location: [place] CV LAB;  Service: Cardiovascular;  Laterality: Left;   PERIPHERAL VASCULAR CATHETERIZATION Left 12/09/2016   Procedure: A/V Fistulagram;  Surgeon: Waynetta Sandy, MD;  Location: [place] CV LAB;  Service: Cardiovascular;  Laterality: Left;   REVISON OF ARTERIOVENOUS FISTULA Left 42/70/6237   Procedure: PLICATION OF BRACHIOCEPHALIC FISTULA;  Surgeon: Conrad , MD;  Location: [place];  Service: Vascular;  Laterality: Left;   REVISON OF ARTERIOVENOUS FISTULA Left 08/09/2017   Procedure: REVISION OF ARTERIOVENOUS FISTULA LIGATION OF SIDE BRANCH;  Surgeon: Waynetta Sandy, MD;  Location: [place];  Service: Vascular;  Laterality: Left;   TUBAL LIGATION  2004    MEDICATIONS: No current facility-administered medications for this encounter.    acetaminophen (TYLENOL) 500 MG tablet   amLODipine (NORVASC) 5 MG tablet   B Complex-C-Folic Acid (DIALYVITE 628) 0.8 MG TABS   carvedilol (COREG) 6.25 MG tablet   ethyl chloride spray   ferric citrate (AURYXIA) 1 GM 210 MG(Fe) tablet   nystatin-triamcinolone ointment (MYCOLOG)   Omega-3 Fatty Acids (OMEGA 3 500) 500 MG CAPS   omeprazole (PRILOSEC) 20 MG capsule   zolpidem (AMBIEN) 5 MG tablet   albuterol (VENTOLIN HFA) 108 (90 Base) MCG/ACT inhaler   ethyl chloride spray   nystatin-triamcinolone ointment (MYCOLOG)   Vitamin D, Ergocalciferol, (DRISDOL) 1.25 MG (50000 UNIT) CAPS capsule    Myra Gianotti, PA-C Surgical Short Stay/Anesthesiology [facility] Phone [phone] [facility] Phone [phone] 09/25/2021 3:49 PM

## 2021-09-25 NOTE — Anesthesia Preprocedure Evaluation (Addendum)
Anesthesia Evaluation  Patient identified by MRN, date of birth, ID band Patient awake    Reviewed: Allergy & Precautions, NPO status , Patient's Chart, lab work & pertinent test results  History of Anesthesia Complications (+) PONV and history of anesthetic complications  Airway Mallampati: I  TM Distance: >3 FB Neck ROM: Full    Dental  (+) Dental Advisory Given, Partial Upper, Partial Lower   Pulmonary    breath sounds clear to auscultation       Cardiovascular hypertension, Pt. on medications and Pt. on home beta blockers  Rhythm:Regular Rate:Normal     Neuro/Psych  Headaches, PSYCHIATRIC DISORDERS Anxiety Depression  Neuromuscular disease    GI/Hepatic Neg liver ROS, GERD  Medicated,  Endo/Other  negative endocrine ROS  Renal/GU Renal disease     Musculoskeletal negative musculoskeletal ROS (+)   Abdominal Normal abdominal exam  (+)   Peds  Hematology negative hematology ROS (+)   Anesthesia Other Findings   Reproductive/Obstetrics                           Anesthesia Physical Anesthesia Plan  ASA: 3  Anesthesia Plan: General   Post-op Pain Management:    Induction: Intravenous  PONV Risk Score and Plan: 4 or greater and Ondansetron, Dexamethasone, Midazolam and Treatment may vary due to age or medical condition  Airway Management Planned: LMA  Additional Equipment: None  Intra-op Plan:   Post-operative Plan: Extubation in OR  Informed Consent: I have reviewed the patients History and Physical, chart, labs and discussed the procedure including the risks, benefits and alternatives for the proposed anesthesia with the patient or authorized representative who has indicated his/her understanding and acceptance.     Dental advisory given  Plan Discussed with: CRNA  Anesthesia Plan Comments: (PAT note written 09/25/2021 by Myra Gianotti, PA-C. )       Anesthesia  Quick Evaluation

## 2021-09-26 ENCOUNTER — Encounter (HOSPITAL_COMMUNITY): Payer: Self-pay | Admitting: Vascular Surgery

## 2021-09-26 ENCOUNTER — Ambulatory Visit (HOSPITAL_COMMUNITY)
Admission: RE | Admit: 2021-09-26 | Discharge: 2021-09-26 | Disposition: A | Discharge status: Home / Self Care | Payer: No Typology Code available for payment source | Source: Ambulatory Visit | Attending: Vascular Surgery | Admitting: Vascular Surgery

## 2021-09-26 ENCOUNTER — Other Ambulatory Visit: Payer: Self-pay

## 2021-09-26 ENCOUNTER — Ambulatory Visit (HOSPITAL_COMMUNITY): Payer: No Typology Code available for payment source | Admitting: Physician Assistant

## 2021-09-26 ENCOUNTER — Encounter (HOSPITAL_COMMUNITY)
Admission: RE | Disposition: A | Discharge status: Home / Self Care | Payer: Self-pay | Source: Ambulatory Visit | Attending: Vascular Surgery

## 2021-09-26 DIAGNOSIS — Z7951 Long term (current) use of inhaled steroids: Secondary | ICD-10-CM | POA: Insufficient documentation

## 2021-09-26 DIAGNOSIS — Z8249 Family history of ischemic heart disease and other diseases of the circulatory system: Secondary | ICD-10-CM | POA: Insufficient documentation

## 2021-09-26 DIAGNOSIS — X58XXXA Exposure to other specified factors, initial encounter: Secondary | ICD-10-CM | POA: Insufficient documentation

## 2021-09-26 DIAGNOSIS — Z79899 Other long term (current) drug therapy: Secondary | ICD-10-CM | POA: Insufficient documentation

## 2021-09-26 DIAGNOSIS — Z992 Dependence on renal dialysis: Secondary | ICD-10-CM | POA: Insufficient documentation

## 2021-09-26 DIAGNOSIS — Z803 Family history of malignant neoplasm of breast: Secondary | ICD-10-CM | POA: Insufficient documentation

## 2021-09-26 DIAGNOSIS — N185 Chronic kidney disease, stage 5: Secondary | ICD-10-CM

## 2021-09-26 DIAGNOSIS — Z888 Allergy status to other drugs, medicaments and biological substances status: Secondary | ICD-10-CM | POA: Insufficient documentation

## 2021-09-26 DIAGNOSIS — Z887 Allergy status to serum and vaccine status: Secondary | ICD-10-CM | POA: Insufficient documentation

## 2021-09-26 DIAGNOSIS — N186 End stage renal disease: Secondary | ICD-10-CM | POA: Insufficient documentation

## 2021-09-26 DIAGNOSIS — T82848A Pain from vascular prosthetic devices, implants and grafts, initial encounter: Secondary | ICD-10-CM | POA: Insufficient documentation

## 2021-09-26 DIAGNOSIS — I12 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 disease: Secondary | ICD-10-CM | POA: Insufficient documentation

## 2021-09-26 HISTORY — PX: AV FISTULA PLACEMENT: SHX1204

## 2021-09-26 SURGERY — ARTERIOVENOUS (AV) FISTULA CREATION
Anesthesia: Choice | Site: Arm Upper | Laterality: Right

## 2021-09-26 MED ORDER — FENTANYL CITRATE (PF) 100 MCG/2ML IJ SOLN
INTRAMUSCULAR | Status: AC
  Filled 2021-09-26: qty 2

## 2021-09-26 MED ORDER — DEXAMETHASONE SODIUM PHOSPHATE 10 MG/ML IJ SOLN
INTRAMUSCULAR | Status: DC | PRN
  Administered 2021-09-26: 8 mg via INTRAVENOUS

## 2021-09-26 MED ORDER — HEPARIN 6000 UNIT IRRIGATION SOLUTION
Status: DC | PRN
  Administered 2021-09-26: 1

## 2021-09-26 MED ORDER — ACETAMINOPHEN 160 MG/5ML PO SOLN: 325.0000 mg | ORAL | Status: DC | PRN

## 2021-09-26 MED ORDER — FENTANYL CITRATE (PF) 250 MCG/5ML IJ SOLN
INTRAMUSCULAR | Status: DC | PRN
  Administered 2021-09-26 (×2): 50 ug via INTRAVENOUS

## 2021-09-26 MED ORDER — ORAL CARE MOUTH RINSE: 15.0000 mL | Freq: Once | OROMUCOSAL | Status: AC

## 2021-09-26 MED ORDER — SODIUM CHLORIDE 0.9 % IV SOLN: INTRAVENOUS | Status: DC

## 2021-09-26 MED ORDER — PROPOFOL 10 MG/ML IV BOLUS
INTRAVENOUS | Status: DC | PRN
  Administered 2021-09-26: 130 mg via INTRAVENOUS

## 2021-09-26 MED ORDER — ACETAMINOPHEN 325 MG PO TABS: 325.0000 mg | ORAL_TABLET | ORAL | Status: DC | PRN

## 2021-09-26 MED ORDER — OXYCODONE HCL 5 MG PO TABS
ORAL_TABLET | ORAL | Status: AC
  Filled 2021-09-26: qty 1

## 2021-09-26 MED ORDER — OXYCODONE HCL 5 MG/5ML PO SOLN: 5.0000 mg | Freq: Once | ORAL | Status: AC | PRN

## 2021-09-26 MED ORDER — ACETAMINOPHEN 10 MG/ML IV SOLN
INTRAVENOUS | Status: AC
  Filled 2021-09-26: qty 100

## 2021-09-26 MED ORDER — CEFAZOLIN SODIUM-DEXTROSE 2-4 GM/100ML-% IV SOLN
2.0000 g | INTRAVENOUS | Status: AC
  Administered 2021-09-26: 2 g via INTRAVENOUS
  Filled 2021-09-26: qty 100

## 2021-09-26 MED ORDER — ONDANSETRON HCL 4 MG/2ML IJ SOLN
INTRAMUSCULAR | Status: DC | PRN
  Administered 2021-09-26: 4 mg via INTRAVENOUS

## 2021-09-26 MED ORDER — FENTANYL CITRATE (PF) 100 MCG/2ML IJ SOLN
25.0000 ug | INTRAMUSCULAR | Status: DC | PRN
  Administered 2021-09-26 (×3): 50 ug via INTRAVENOUS

## 2021-09-26 MED ORDER — EPHEDRINE SULFATE-NACL 50-0.9 MG/10ML-% IV SOSY
PREFILLED_SYRINGE | INTRAVENOUS | Status: DC | PRN
  Administered 2021-09-26 (×2): 5 mg via INTRAVENOUS

## 2021-09-26 MED ORDER — MIDAZOLAM HCL 2 MG/2ML IJ SOLN
INTRAMUSCULAR | Status: DC | PRN
Start: 2021-09-26 — End: 2021-09-26
  Administered 2021-09-26: 2 mg via INTRAVENOUS

## 2021-09-26 MED ORDER — LIDOCAINE 2% (20 MG/ML) 5 ML SYRINGE
INTRAMUSCULAR | Status: DC | PRN
  Administered 2021-09-26: 60 mg via INTRAVENOUS

## 2021-09-26 MED ORDER — FENTANYL CITRATE (PF) 250 MCG/5ML IJ SOLN
INTRAMUSCULAR | Status: AC
  Filled 2021-09-26: qty 5

## 2021-09-26 MED ORDER — AMISULPRIDE (ANTIEMETIC) 5 MG/2ML IV SOLN: 10.0000 mg | Freq: Once | INTRAVENOUS | Status: DC | PRN

## 2021-09-26 MED ORDER — OXYCODONE-ACETAMINOPHEN 5-325 MG PO TABS: 1.0000 | ORAL_TABLET | ORAL | 0 refills | Status: DC | PRN

## 2021-09-26 MED ORDER — ACETAMINOPHEN 10 MG/ML IV SOLN
1000.0000 mg | Freq: Once | INTRAVENOUS | Status: DC | PRN
  Administered 2021-09-26: 1000 mg via INTRAVENOUS

## 2021-09-26 MED ORDER — 0.9 % SODIUM CHLORIDE (POUR BTL) OPTIME
TOPICAL | Status: DC | PRN
  Administered 2021-09-26: 1000 mL

## 2021-09-26 MED ORDER — CHLORHEXIDINE GLUCONATE 4 % EX LIQD: 60.0000 mL | Freq: Once | CUTANEOUS | Status: DC

## 2021-09-26 MED ORDER — MIDAZOLAM HCL 2 MG/2ML IJ SOLN
INTRAMUSCULAR | Status: AC
  Filled 2021-09-26: qty 2

## 2021-09-26 MED ORDER — PROPOFOL 10 MG/ML IV BOLUS
INTRAVENOUS | Status: AC
  Filled 2021-09-26: qty 20

## 2021-09-26 MED ORDER — CHLORHEXIDINE GLUCONATE 0.12 % MT SOLN
15.0000 mL | Freq: Once | OROMUCOSAL | Status: AC
  Administered 2021-09-26: 15 mL via OROMUCOSAL
  Filled 2021-09-26: qty 15

## 2021-09-26 MED ORDER — OXYCODONE HCL 5 MG PO TABS
5.0000 mg | ORAL_TABLET | Freq: Once | ORAL | Status: AC | PRN
  Administered 2021-09-26: 5 mg via ORAL

## 2021-09-26 MED ORDER — DIPHENHYDRAMINE HCL 50 MG/ML IJ SOLN
INTRAMUSCULAR | Status: DC | PRN
  Administered 2021-09-26: 12.5 mg via INTRAVENOUS

## 2021-09-26 MED ORDER — PROMETHAZINE HCL 25 MG/ML IJ SOLN: 6.2500 mg | INTRAMUSCULAR | Status: DC | PRN

## 2021-09-26 SURGICAL SUPPLY — 34 items
ADH SKN CLS APL DERMABOND .7 (GAUZE/BANDAGES/DRESSINGS) ×1
ARMBAND PINK RESTRICT EXTREMIT (MISCELLANEOUS) ×2 IMPLANT
BAG COUNTER SPONGE SURGICOUNT (BAG) ×2 IMPLANT
BAG SPNG CNTER NS LX DISP (BAG) ×1
CANISTER SUCT 3000ML PPV (MISCELLANEOUS) ×2 IMPLANT
CLIP LIGATING EXTRA MED SLVR (CLIP) ×2 IMPLANT
CLIP LIGATING EXTRA SM BLUE (MISCELLANEOUS) ×2 IMPLANT
CLIP VESOCCLUDE MED 6/CT (CLIP) ×1 IMPLANT
CLIP VESOCCLUDE SM WIDE 6/CT (CLIP) ×1 IMPLANT
COVER PROBE W GEL 5X96 (DRAPES) IMPLANT
DERMABOND ADVANCED (GAUZE/BANDAGES/DRESSINGS) ×1
DERMABOND ADVANCED .7 DNX12 (GAUZE/BANDAGES/DRESSINGS) ×1 IMPLANT
ELECT REM PT RETURN 9FT ADLT (ELECTROSURGICAL) ×2
ELECTRODE REM PT RTRN 9FT ADLT (ELECTROSURGICAL) ×1 IMPLANT
GAUZE 4X4 16PLY ~~LOC~~+RFID DBL (SPONGE) ×1 IMPLANT
GLOVE SURG ENC MOIS LTX SZ7.5 (GLOVE) ×2 IMPLANT
GOWN STRL REUS W/ TWL LRG LVL3 (GOWN DISPOSABLE) ×2 IMPLANT
GOWN STRL REUS W/ TWL XL LVL3 (GOWN DISPOSABLE) ×1 IMPLANT
GOWN STRL REUS W/TWL LRG LVL3 (GOWN DISPOSABLE) ×4
GOWN STRL REUS W/TWL XL LVL3 (GOWN DISPOSABLE) ×2
INSERT FOGARTY SM (MISCELLANEOUS) IMPLANT
KIT BASIN OR (CUSTOM PROCEDURE TRAY) ×2 IMPLANT
KIT TURNOVER KIT B (KITS) ×2 IMPLANT
NS IRRIG 1000ML POUR BTL (IV SOLUTION) ×2 IMPLANT
PACK CV ACCESS (CUSTOM PROCEDURE TRAY) ×2 IMPLANT
PAD ARMBOARD 7.5X6 YLW CONV (MISCELLANEOUS) ×4 IMPLANT
SPONGE T-LAP 18X18 ~~LOC~~+RFID (SPONGE) ×1 IMPLANT
SUT MNCRL AB 4-0 PS2 18 (SUTURE) ×2 IMPLANT
SUT PROLENE 6 0 BV (SUTURE) ×4 IMPLANT
SUT VIC AB 3-0 SH 27 (SUTURE) ×2
SUT VIC AB 3-0 SH 27X BRD (SUTURE) ×1 IMPLANT
TOWEL GREEN STERILE (TOWEL DISPOSABLE) ×2 IMPLANT
UNDERPAD 30X36 HEAVY ABSORB (UNDERPADS AND DIAPERS) ×2 IMPLANT
WATER STERILE IRR 1000ML POUR (IV SOLUTION) ×2 IMPLANT

## 2021-09-26 NOTE — Transfer of Care (Signed)
Immediate Anesthesia Transfer of Care Note  Patient: Sabrina Mitchell  Procedure(s) Performed: RIGHT ARM ARTERIOVENOUS (AV) FISTULA CREATION (Right: Arm Upper)  Patient Location: PACU  Anesthesia Type:General  Level of Consciousness: awake and sedated  Airway & Oxygen Therapy: Patient Spontanous Breathing and Patient connected to nasal cannula oxygen  Post-op Assessment: Report given to RN and Post -op Vital signs reviewed and stable  Post vital signs: Reviewed and stable  Last Vitals:  Vitals Value Taken Time  BP 177/69 09/26/21 1033  Temp    Pulse 59 09/26/21 1035  Resp 11 09/26/21 1035  SpO2 100 % 09/26/21 1035  Vitals shown include unvalidated device data.  Last Pain:  Vitals:   09/26/21 0806  TempSrc:   PainSc: 2       Patients Stated Pain Goal: 2 (71/69/67 8938)  Complications: No notable events documented.

## 2021-09-26 NOTE — Interval H&P Note (Signed)
History and Physical Interval Note:  09/26/2021 7:21 AM  Sabrina Mitchell  has presented today for surgery, with the diagnosis of ESRD.  The various methods of treatment have been discussed with the patient and family. After consideration of risks, benefits and other options for treatment, the patient has consented to  Procedure(s): RIGHT ARM ARTERIOVENOUS (AV) FISTULA VERSUS ARTERIOVENOUS GRAFT CREATION (Right) as a surgical intervention.  The patient's history has been reviewed, patient examined, no change in status, stable for surgery.  I have reviewed the patient's chart and labs.  Questions were answered to the patient's satisfaction.     Servando Snare

## 2021-09-26 NOTE — Anesthesia Procedure Notes (Signed)
Procedure Name: LMA Insertion Date/Time: 09/26/2021 9:38 AM Performed by: Mariea Clonts, CRNA Pre-anesthesia Checklist: Patient identified, Emergency Drugs available, Suction available and Patient being monitored Patient Re-evaluated:Patient Re-evaluated prior to induction Oxygen Delivery Method: Circle System Utilized Preoxygenation: Pre-oxygenation with 100% oxygen Induction Type: IV induction Ventilation: Mask ventilation without difficulty LMA: LMA inserted LMA Size: 4.0 Number of attempts: 1 Airway Equipment and Method: Bite block Placement Confirmation: positive ETCO2 Tube secured with: Tape Dental Injury: Teeth and Oropharynx as per pre-operative assessment

## 2021-09-26 NOTE — Op Note (Signed)
    Patient name: Sabrina Mitchell MRN: [id number] DOB: 05-19-1961 Sex: female  09/26/2021 Pre-operative Diagnosis: End-stage renal disease Post-operative diagnosis:  Same Surgeon:  Erlene Quan C. Donzetta Matters, MD Procedure Performed: Right brachial artery to cephalic vein AV fistula creation  Indications: 60 year old female with end-stage renal disease currently dialyzing via catheter.  She has a left arm AV fistula but is too painful for Use she is now indicated for right upper extremity access.  Findings: Cephalic vein in the antecubital measured approximately 4 mm as did the basilic vein just above the antecubitum.  I elected to create cephalic vein AV fistula.  At completion there was a very strong thrill proximally this could be traced with Doppler in the higher upper arm.  There is a palpable radial artery pulse was confirmed with Doppler.   Procedure:  The patient was identified in the holding area and taken to the operating where she was put supine on upper table and LMA anesthesia was induced.  She was sterilely prepped draped in the right upper extremity usual fashion, antibiotics were administered and a timeout was called.  We use ultrasound identified a basilic vein above the antecubitum with cephalic vein at the antecubitum.  We elected to place a cephalic vein fistula.  Transverse incision was made.  We dissected out the vein.  This was marked for orientation.  Branches were divided between ties.  There was evidence of previous IV access there we did divide the scar tissue off of the vein but it appeared healthy itself.  We dissected through the deep fascia the artery.  The artery itself measured approximately 4 mm after the vein.  Vesseloops placed around the artery.  The vein was clamped distally and tied off there were 2 branches that we spatulated together.  We then flushed with heparinized saline and clamped it.  The artery stump is approximate open longitudinally flushed distally with  heparinized saline.  The vein was sewn into seven 6-0 Prolene suture.  Prior completion without flushing all directions.  Upon completion there was a very strong thrill proximally in the vein this could be traced with Doppler of the arm.  There is a palpable radial pulse at the wrist confirmed with Doppler.  We irrigated the wound obtaining stasis closed in layers of Vicryl and Monocryl.  Dermabond was placed at the skin level.  She was awakened from anesthesia having tolerated procedure without any complication.  All counts were correct at completion.  EBL: 20 cc    Adayah Arocho C. Donzetta Matters, MD [facility] Office: [phone] Pager: [phone]

## 2021-09-26 NOTE — Progress Notes (Signed)
Pacu Discharge Note  Patient instuctions were given to family. Wound care, diet, pain, follow up care and how and whom to contact with concerns were discussed. Family aware someone needs to remain with patient overnight and concerns after receiving anesthesia and what to avoid and safety. Answered all questions and concerns.   Discharge paperwork has clear contact informations for surgeon and 24 hour RN line for concerns.   Discussed what concerns to look for including infection and signs/symptoms to look for.   IV was removed prior to discharge. Patient was brought to car with belongings.  Pt may taker her Bp meds tonight per Anesthesia when she gets home.   Pt exits my care.

## 2021-09-26 NOTE — Anesthesia Postprocedure Evaluation (Signed)
Anesthesia Post Note  Patient: Sabrina Mitchell  Procedure(s) Performed: RIGHT ARM ARTERIOVENOUS (AV) FISTULA CREATION (Right: Arm Upper)     Patient location during evaluation: PACU Anesthesia Type: General Level of consciousness: awake and alert Pain management: pain level controlled Vital Signs Assessment: post-procedure vital signs reviewed and stable Respiratory status: spontaneous breathing, nonlabored ventilation, respiratory function stable and patient connected to nasal cannula oxygen Cardiovascular status: blood pressure returned to baseline and stable Postop Assessment: no apparent nausea or vomiting Anesthetic complications: no   No notable events documented.  Last Vitals:  Vitals:   09/26/21 1130 09/26/21 1139  BP:  (!) 167/68  Pulse: 74 64  Resp: 12 12  Temp: 37.2 C   SpO2: 99% 98%    Last Pain:  Vitals:   09/26/21 1130  TempSrc:   PainSc: 3                  Effie Berkshire

## 2021-09-27 ENCOUNTER — Encounter (HOSPITAL_COMMUNITY): Payer: Self-pay | Admitting: Vascular Surgery

## 2021-09-27 LAB — POCT I-STAT, CHEM 8
BUN: 29 mg/dL — ABNORMAL HIGH (ref 6–20)
Calcium, Ion: 1.1 mmol/L — ABNORMAL LOW (ref 1.15–1.40)
Chloride: 96 mmol/L — ABNORMAL LOW (ref 98–111)
Creatinine, Ser: 5.7 mg/dL — ABNORMAL HIGH (ref 0.44–1.00)
Glucose, Bld: 84 mg/dL (ref 70–99)
HCT: 39 % (ref 36.0–46.0)
Hemoglobin: 13.3 g/dL (ref 12.0–15.0)
Potassium: 3.4 mmol/L — ABNORMAL LOW (ref 3.5–5.1)
Sodium: 137 mmol/L (ref 135–145)
TCO2: 32 mmol/L (ref 22–32)

## 2021-10-06 ENCOUNTER — Other Ambulatory Visit: Payer: Self-pay

## 2021-10-06 ENCOUNTER — Encounter: Payer: Self-pay | Admitting: Internal Medicine

## 2021-10-06 ENCOUNTER — Ambulatory Visit: Payer: Self-pay | Attending: Internal Medicine | Admitting: Internal Medicine

## 2021-10-06 VITALS — BP 119/79 | HR 69 | Resp 16 | Ht 59.0 in | Wt 123.8 lb

## 2021-10-06 DIAGNOSIS — Z23 Encounter for immunization: Secondary | ICD-10-CM

## 2021-10-06 DIAGNOSIS — R21 Rash and other nonspecific skin eruption: Secondary | ICD-10-CM

## 2021-10-06 DIAGNOSIS — Z992 Dependence on renal dialysis: Secondary | ICD-10-CM

## 2021-10-06 DIAGNOSIS — I10 Essential (primary) hypertension: Secondary | ICD-10-CM

## 2021-10-06 DIAGNOSIS — N186 End stage renal disease: Secondary | ICD-10-CM

## 2021-10-06 DIAGNOSIS — Z0000 Encounter for general adult medical examination without abnormal findings: Secondary | ICD-10-CM

## 2021-10-06 DIAGNOSIS — M19049 Primary osteoarthritis, unspecified hand: Secondary | ICD-10-CM

## 2021-10-06 DIAGNOSIS — Z0001 Encounter for general adult medical examination with abnormal findings: Secondary | ICD-10-CM

## 2021-10-06 DIAGNOSIS — G4700 Insomnia, unspecified: Secondary | ICD-10-CM

## 2021-10-06 MED ORDER — CARVEDILOL 6.25 MG PO TABS
6.2500 mg | ORAL_TABLET | Freq: Two times a day (BID) | ORAL | 12 refills | Status: DC
  Filled 2021-10-06: qty 60, 30d supply, fill #0
  Filled 2021-11-04: qty 60, 30d supply, fill #1
  Filled 2021-12-10: qty 60, 30d supply, fill #2
  Filled 2022-01-09: qty 60, 30d supply, fill #0

## 2021-10-06 MED ORDER — NYSTATIN-TRIAMCINOLONE 100000-0.1 UNIT/GM-% EX OINT
1.0000 "application " | TOPICAL_OINTMENT | Freq: Two times a day (BID) | CUTANEOUS | 0 refills | Status: DC
  Filled 2021-10-06: qty 30, 15d supply, fill #0

## 2021-10-06 NOTE — Progress Notes (Signed)
Patient ID: Sabrina Mitchell, female    DOB: 04/01/1961  MRN: [id number]  CC: Annual Exam   Subjective: Sabrina Mitchell is a 60 y.o. female who presents for annual exam Her concerns today include:  60 year old with ESRD on HD, HTN, ACD, GERD, vit D def and insomnia.  HTN:  on Coreg and Norvasc.  Reports compliance with taking medications and low-salt diet.  She gets right-sided chest pains sometimes around the site of her dialysis catheter.  Does have some lower extremity edema.   ESRD: had fistula placed RUE recently.  Currently gets HD through catheter RU chest wall.  She is not on any NSAIDs.  Insomnia: taking and tolerating Ambien.  Not having to take it every night  HM: Due for pap next yr.  Thought she was due today. Had MMG earlier this yr.  Will get flu shot at HD. Due for 2nd shingles vaccine.  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     albuterol (VENTOLIN HFA) 108 (90 Base) MCG/ACT inhaler INHALE 2 PUFF USING INHALER EVERY FOUR TO SIX HOURS WHILE AWAKE AS NEEDED FOR WHEEZING, COUGH, OR SHORTNESS OF BREATH  (Patient not taking: Reported on 09/24/2021) 18 g 1   amLODipine (NORVASC) 5 MG tablet Take 1 tablet by mouth twice a day for high blood pressure (Patient taking differently: Take 5 mg by mouth See admin instructions. Take 5 mg daily, may take a second 5 mg dose as needed for high bp) 180 tablet 3   B Complex-C-Folic Acid (DIALYVITE 876) 0.8 MG TABS Take 1 tablet by mouth daily in the afternoon.     carvedilol (COREG) 6.25 MG tablet TAKE 1 TABLET BY MOUTH ONCE A DAY AS DIRECTED FOR HIGH BLOOD PRESSURE (Patient taking differently: Take 6.25 mg by mouth in the morning. Take additional 6.25 mg in the evening if blood pressure is high) 90 tablet 3   ethyl chloride spray APPLY A SMALL AMOUNT OF SPRAY TO DIALYSIS ACCESS JUST BEFORE NEEDLE STICK THREE TIMES A WEEK AS DIRECTED (Patient taking differently: Apply 1 application topically daily as needed (port access).) 348 mL 3   ethyl chloride spray Spray 1 as directed to skin three times a week as directed apply a small amount of spray to dialysis access just before needle stick (Patient not taking: No sig reported) 116 mL 3   ferric citrate (AURYXIA) 1 GM 210 MG(Fe) tablet Take 420-630 mg by mouth See admin instructions. Take 420 - 630 mg with each meal or snack     Omega-3 Fatty Acids (OMEGA 3 500) 500 MG CAPS Take 500 mg by mouth every other day.     omeprazole (  PRILOSEC) 20 MG capsule Take 1 capsule (20 mg total) by mouth daily. 1 PO 30 mins prior to breakfast and supper (Patient taking differently: Take 20 mg by mouth daily as needed (heartburn).) 30 capsule 3   oxyCODONE-acetaminophen (PERCOCET) 5-325 MG tablet Take 1 tablet by mouth every 4 (four) hours as needed for severe pain. 20 tablet 0   Vitamin D, Ergocalciferol, (DRISDOL) 1.25 MG (50000 UNIT) CAPS capsule Take 1 capsule (50,000 Units total) by mouth every 7 (seven) days. (Patient not taking: Reported on 09/24/2021) 12 capsule 1   zolpidem (AMBIEN) 5 MG tablet Take 1 tablet (5 mg total) by mouth at  bedtime as needed for sleep. 30 tablet 0   No current facility-administered medications on file prior to visit.    Allergies  Allergen Reactions   Covid-19 (Mrna) Vaccine Swelling    Tongue swelling   Morphine And Related Nausea And Vomiting   Remeron [Mirtazapine] Other (See Comments)    Chest pain   Zoloft [Sertraline]     CP and chills   Amitriptyline Other (See Comments)    Makes her shake   Trazodone And Nefazodone Other (See Comments)    Makes her shake    Social History   Socioeconomic History   Marital status: Single    Spouse name: Not on file   Number of children: 2   Years of education: Not on file   Highest education level: Not on file  Occupational History   Not on file  Tobacco Use   Smoking status: Never   Smokeless tobacco: Never  Vaping Use   Vaping Use: Never used  Substance and Sexual Activity   Alcohol use: No   Drug use: No   Sexual activity: Not Currently    Birth control/protection: Surgical  Other Topics Concern   Not on file  Social History Narrative   Not on file   Social Determinants of Health   Financial Resource Strain: Not on file  Food Insecurity: Not on file  Transportation Needs: Not on file  Physical Activity: Not on file  Stress: Not on file  Social Connections: Not on file  Intimate Partner Violence: Not on file    Family History  Problem Relation Age of Onset   Hypertension Mother    Hypertension Father    Hypertension Sister    Breast cancer Sister 33   Diabetes Brother    Hypertension Brother     Past Surgical History:  Procedure Laterality Date   AV FISTULA PLACEMENT Left 07/05/09   Dr. Kellie Simmering   AV FISTULA PLACEMENT Right 09/26/2021   Procedure: RIGHT ARM ARTERIOVENOUS (AV) FISTULA CREATION;  Surgeon: Waynetta Sandy, MD;  Location: [place];  Service: Vascular;  Laterality: Right;   COLONOSCOPY N/A 02/10/2016   SLF: 1. HEME postive stool due to colon polyps intrernal hemorrhoids 2. small internal  hemrrohoids 3. moderate external hemorrhoids.    ESOPHAGEAL DILATION N/A 02/10/2016   Procedure: ESOPHAGEAL DILATION;  Surgeon: Danie Binder, MD;  [facility];  Service: Endoscopy;  Laterality: N/A;   ESOPHAGOGASTRODUODENOSCOPY N/A 02/10/2016   SLF: patent Schatzki's ring , mild gastritis/ duodentitis.    FISTULA SUPERFICIALIZATION Left 32/03/4009   Procedure: PLICATION BRACHIOCEPHALIC FISTULA;  Surgeon: Angelia Mould, MD;  Location: [place];  Service: Vascular;  Laterality: Left;   FISTULOGRAM N/A 10/22/2014   Procedure: FISTULOGRAM;  Surgeon: Angelia Mould, MD;  Location: [facility] CATH LAB;  Service: Cardiovascular;  Laterality: N/A;   INSERTION OF DIALYSIS  CATHETER Right 08/27/2021   Procedure: INSERTION OF 19cm TUNNELED DIALYSIS CATHETER INTO RIGHT NECK;  Surgeon: Marty Heck, MD;  Location: [place];  Service: Vascular;  Laterality: Right;   IR DIALY SHUNT INTRO NEEDLE/INTRACATH INITIAL W/IMG LEFT Left 02/01/2019   IR DIALY SHUNT INTRO NEEDLE/INTRACATH INITIAL W/IMG LEFT Left 06/05/2020   IR DIALY SHUNT INTRO NEEDLE/INTRACATH INITIAL W/IMG LEFT Left 05/09/2021   IR THROMBECTOMY AV FISTULA W/THROMBOLYSIS/PTA INC/SHUNT/IMG LEFT Left 08/18/2021   IR US GUIDE VASC ACCESS LEFT  08/18/2021   PERIPHERAL VASCULAR BALLOON ANGIOPLASTY Left 07/14/2021   Procedure: PERIPHERAL VASCULAR BALLOON ANGIOPLASTY;  Surgeon: Waynetta Sandy, MD;  Location: [place] CV LAB;  Service: Cardiovascular;  Laterality: Left;   PERIPHERAL VASCULAR CATHETERIZATION Left 12/09/2016   Procedure: A/V Fistulagram;  Surgeon: Waynetta Sandy, MD;  Location: [place] CV LAB;  Service: Cardiovascular;  Laterality: Left;   REVISON OF ARTERIOVENOUS FISTULA Left 06/22/9484   Procedure: PLICATION OF BRACHIOCEPHALIC FISTULA;  Surgeon: Conrad Winnetka, MD;  Location: [place];  Service: Vascular;  Laterality: Left;   REVISON OF ARTERIOVENOUS FISTULA Left 08/09/2017   Procedure: REVISION OF  ARTERIOVENOUS FISTULA LIGATION OF SIDE BRANCH;  Surgeon: Waynetta Sandy, MD;  Location: [place];  Service: Vascular;  Laterality: Left;   TUBAL LIGATION  2004    ROS: Review of Systems  Constitutional:  Negative for appetite change.  HENT:  Negative for ear pain.        Wears dentures above and below  Eyes:        Reports problems with seeing things close up and far away.  She has never had a formal eye exam.  She is uninsured.  Respiratory:  Negative for shortness of breath.   Gastrointestinal:  Negative for abdominal pain.  Musculoskeletal:        Complains of pain in the distal joints of her fingers especially the middle finger on both hands.  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  Skin:        Complains of itchy rash between the buttocks.  She is requesting cream for it.  Denies itching around the rectum/anus.    PHYSICAL EXAM: BP 119/79   Pulse 69   Resp 16   Ht 4\' 11"  (1.499 m)   Wt 123 lb 12.8 oz (56.2 kg)   LMP 02/11/2012 Comment: tubal ligation  SpO2 99%   BMI 25.00 kg/m   Physical Exam  General appearance - alert, well appearing, and in no distress Mental status - normal mood, behavior, speech, dress, motor activity, and thought processes Eyes - pupils equal and reactive, extraocular eye movements intact Ears - bilateral TM's and external ear canals normal Nose - normal and patent, no erythema, discharge or polyps Mouth -patient is wearing her dentures.  She does not open her mouth adequate enough for me to see the back of the throat. Neck - supple, no significant adenopathy Lymphatics - no palpable lymphadenopathy, no hepatosplenomegaly Chest - clear to auscultation, no wheezes, rales or rhonchi, symmetric air entry Heart - normal rate, regular rhythm, normal S1, S2, no murmurs, rubs, clicks or gallops Abdomen - soft, nontender, nondistended, no masses or organomegaly Musculoskeletal -mild  deformity of the PIP joints of the middle finger of both hands. Extremities -trace bilateral lower extremity edema.  She has HD fistula in the left upper arm and a new one noted in the right arm. Skin: Waxy appearance to the skin  between the gluteal fold.  CMP Latest Ref Rng & Units 09/26/2021 08/27/2021 08/17/2021  Glucose 70 - 99 mg/dL 84 81 84  BUN 6 - 20 mg/dL 29(H) 23(H) 74(H)  Creatinine 0.44 - 1.00 mg/dL 5.70(H) 7.20(H) 10.16(H)  Sodium 135 - 145 mmol/L 137 137 136  Potassium 3.5 - 5.1 mmol/L 3.4(L) 3.6 3.8  Chloride 98 - 111 mmol/L 96(L) 97(L) 98  CO2 22 - 32 mmol/L - - 24  Calcium 8.9 - 10.3 mg/dL - - 9.6  Total Protein 6.5 - 8.1 g/dL - - -  Total Bilirubin 0.3 - 1.2 mg/dL - - -  Alkaline Phos 38 - 126 U/L - - -  AST 15 - 41 U/L - - -  ALT 0 - 44 U/L - - -   Lipid Panel     Component Value Date/Time   CHOL 195 03/12/2021 0954   TRIG 109 03/12/2021 0954   HDL 65 03/12/2021 0954   CHOLHDL 3.0 03/12/2021 0954   CHOLHDL 2.8 Ratio 10/22/2010 2104   VLDL 30 10/22/2010 2104   LDLCALC 111 (H) 03/12/2021 0954    CBC    Component Value Date/Time   WBC 5.0 08/17/2021 1243   RBC 2.33 (L) 08/17/2021 1243   HGB 13.3 09/26/2021 0827   HGB 10.1 (L) 03/12/2021 0954   HCT 39.0 09/26/2021 0827   HCT 30.6 (L) 03/12/2021 0954   PLT 137 (L) 08/17/2021 1243   PLT 232 03/12/2021 0954   MCV 92.3 08/17/2021 1243   MCV 96 03/12/2021 0954   MCH 30.9 08/17/2021 1243   MCHC 33.5 08/17/2021 1243   RDW 13.1 08/17/2021 1243   RDW 14.3 03/12/2021 0954   LYMPHSABS 1.1 08/17/2021 1243   LYMPHSABS 1.1 03/12/2021 0954   MONOABS 0.3 08/17/2021 1243   EOSABS 0.0 08/17/2021 1243   EOSABS 0.1 03/12/2021 0954   BASOSABS 0.0 08/17/2021 1243   BASOSABS 0.0 03/12/2021 0954    ASSESSMENT AND PLAN: 1. Annual physical exam Advised patient to get eye exam done at least once every 2 years.  I told her of places where she can get an eye exam done because effectively.  2. Essential hypertension At  goal on current dose of amlodipine and carvedilol.  3. ESRD on hemodialysis [facility]) Patient will continue her dialysis schedule.  When she gets the flu shot from her dialysis center, I advised patient to write down the date so she can give it to me on her next visit to update health maintenance.  4. Rash Seems to be a fungal type rash.  Refill given on Mycolog cream. - nystatin-triamcinolone ointment (MYCOLOG); Apply 1 application topically 2 (two) times daily.  Dispense: 30 g; Refill: 0  5. Need for shingles vaccine Second Shingrix shot given today.  6. Insomnia, unspecified type She will continue Ambien as needed.  7. Arthritis pain of hand Recommend Tylenol over-the-counter as needed.   Patient was given the opportunity to ask questions.  Patient verbalized understanding of the plan and was able to repeat key elements of the plan.  [employer] Language interpreter used during this encounter. #[id number], Luis  No orders of the defined types were placed in this encounter.    Requested Prescriptions   Signed Prescriptions Disp Refills   nystatin-triamcinolone ointment (MYCOLOG) 30 g 0    Sig: Apply 1 application topically 2 (two) times daily.    Return in about 5 months (around 03/06/2022).  Karle Plumber, MD, FACP

## 2021-10-13 ENCOUNTER — Other Ambulatory Visit: Payer: Self-pay

## 2021-10-24 ENCOUNTER — Telehealth: Payer: Self-pay | Admitting: Internal Medicine

## 2021-10-24 DIAGNOSIS — F5104 Psychophysiologic insomnia: Secondary | ICD-10-CM

## 2021-10-24 MED ORDER — ZOLPIDEM TARTRATE 5 MG PO TABS: 5.0000 mg | ORAL_TABLET | Freq: Every evening | ORAL | 0 refills | Status: DC | PRN

## 2021-10-24 NOTE — Telephone Encounter (Signed)
1) Medication(s) Requested (by name):  Ambien   2) Pharmacy of Choice:  [facility] [place] - [address]    3) Special Requests:  Patient is out of medication.    Approved medications will be sent to the pharmacy, we will reach out if there is an issue.  Requests made after 3pm may not be addressed until the following business day!  If a patient is unsure of the name of the medication(s) please note and ask patient to call back when they are able to provide all info, do not send to responsible party until all information is available!

## 2021-10-30 ENCOUNTER — Other Ambulatory Visit: Payer: Self-pay

## 2021-10-30 DIAGNOSIS — N186 End stage renal disease: Secondary | ICD-10-CM

## 2021-11-04 ENCOUNTER — Other Ambulatory Visit: Payer: Self-pay

## 2021-11-05 ENCOUNTER — Ambulatory Visit (HOSPITAL_COMMUNITY)
Admission: RE | Admit: 2021-11-05 | Discharge: 2021-11-05 | Disposition: A | Discharge status: Home / Self Care | Payer: Self-pay | Source: Ambulatory Visit | Attending: Vascular Surgery | Admitting: Vascular Surgery

## 2021-11-05 ENCOUNTER — Ambulatory Visit (INDEPENDENT_AMBULATORY_CARE_PROVIDER_SITE_OTHER): Payer: Self-pay | Admitting: Physician Assistant

## 2021-11-05 ENCOUNTER — Encounter: Payer: Self-pay | Admitting: Vascular Surgery

## 2021-11-05 ENCOUNTER — Other Ambulatory Visit: Payer: Self-pay

## 2021-11-05 VITALS — BP 108/67 | HR 65 | Temp 98.0°F | Resp 20 | Ht 59.0 in | Wt 119.0 lb

## 2021-11-05 DIAGNOSIS — N186 End stage renal disease: Secondary | ICD-10-CM

## 2021-11-05 DIAGNOSIS — Z992 Dependence on renal dialysis: Secondary | ICD-10-CM | POA: Insufficient documentation

## 2021-11-05 NOTE — Progress Notes (Signed)
POST OPERATIVE DIALYSIS ACCESS OFFICE NOTE    CC:  F/u for dialysis access surgery  HPI:  This is a 60 y.o. female who is s/p right brachial artery to cephalic vein AV fistula creation.  The patient declines interpretive services and her niece accompanies her who is fluent in [place].  The patient denies hand pain, numbness or tingling.   Dialysis days: Tuesdays, Thursdays, Saturdays Dialysis center: [facility]  Allergies  Allergen Reactions   Covid-19 (Mrna) Vaccine Swelling    Tongue swelling   Morphine And Related Nausea And Vomiting   Remeron [Mirtazapine] Other (See Comments)    Chest pain   Zoloft [Sertraline]     CP and chills   Amitriptyline Other (See Comments)    Makes her shake   Trazodone And Nefazodone Other (See Comments)    Makes her shake    Current Outpatient Medications  Medication Sig Dispense Refill   acetaminophen (TYLENOL) 500 MG tablet Take 500-1,000 mg by mouth every 6 (six) hours as needed for moderate pain or headache.     albuterol (VENTOLIN HFA) 108 (90 Base) MCG/ACT inhaler INHALE 2 PUFF USING INHALER EVERY FOUR TO SIX HOURS WHILE AWAKE AS NEEDED FOR WHEEZING, COUGH, OR SHORTNESS OF BREATH 18 g 1   amLODipine (NORVASC) 5 MG tablet Take 1 tablet by mouth twice a day for high blood pressure (Patient taking differently: Take 5 mg by mouth See admin instructions. Take 5 mg daily, may take a second 5 mg dose as needed for high bp) 180 tablet 3   B Complex-C-Folic Acid (DIALYVITE 144) 0.8 MG TABS Take 1 tablet by mouth daily in the afternoon.     carvedilol (COREG) 6.25 MG tablet TAKE 1 TABLET BY MOUTH ONCE A DAY AS DIRECTED FOR HIGH BLOOD PRESSURE (Patient taking differently: Take 6.25 mg by mouth in the morning. Take additional 6.25 mg in the evening if blood pressure is high) 90 tablet 3   carvedilol (COREG) 6.25 MG tablet Take 1 tablet (6.25 mg total) by mouth 2 (two) times daily. 60 tablet 12   Doxercalciferol (HECTOROL IV)  Doxercalciferol (Hectorol)     ethyl chloride spray APPLY A SMALL AMOUNT OF SPRAY TO DIALYSIS ACCESS JUST BEFORE NEEDLE STICK THREE TIMES A WEEK AS DIRECTED (Patient taking differently: Apply 1 application topically daily as needed (port access).) 348 mL 3   ethyl chloride spray Spray 1 as directed to skin three times a week as directed apply a small amount of spray to dialysis access just before needle stick 116 mL 3   ferric citrate (AURYXIA) 1 GM 210 MG(Fe) tablet Take 420-630 mg by mouth See admin instructions. Take 420 - 630 mg with each meal or snack     heparin 1000 unit/mL SOLN injection Heparin Sodium (Porcine) 1,000 Units/mL Catheter Lock Arterial     LOPERAMIDE HCL PO Take by mouth.     nystatin-triamcinolone ointment (MYCOLOG) Apply 1 application topically 2 (two) times daily. 30 g 0   Omega-3 Fatty Acids (OMEGA 3 500) 500 MG CAPS Take 500 mg by mouth every other day.     omeprazole (PRILOSEC) 20 MG capsule Take 1 capsule (20 mg total) by mouth daily. 1 PO 30 mins prior to breakfast and supper (Patient taking differently: Take 20 mg by mouth daily as needed (heartburn).) 30 capsule 3   oxyCODONE-acetaminophen (PERCOCET) 5-325 MG tablet Take 1 tablet by mouth every 4 (four) hours as needed for severe pain. 20 tablet 0   Vitamin  D, Ergocalciferol, (DRISDOL) 1.25 MG (50000 UNIT) CAPS capsule Take 1 capsule (50,000 Units total) by mouth every 7 (seven) days. 12 capsule 1   zolpidem (AMBIEN) 5 MG tablet Take 1 tablet (5 mg total) by mouth at bedtime as needed for sleep. 30 tablet 0   No current facility-administered medications for this visit.     ROS:  See HPI  BP 108/67 (BP Location: Left Wrist, Patient Position: Sitting, Cuff Size: Normal)   Pulse 65   Temp 98 F (36.7 C) (Temporal)   Resp 20   Ht 4\' 11"  (1.499 m)   Wt 119 lb (54 kg)   LMP 02/11/2012 Comment: tubal ligation  SpO2 99%   BMI 24.04 kg/m    Physical Exam:  General appearance: awake, alert in NAD Chest:  catheter site without bleeding or erythema. Dressing dry and intact. Respirations: unlabored; no dyspnea at rest Right upper extremity: Hand is warm with 5/5 grip strength. Motor function and sensation intact. Good bruit and thrill in fistula. 2+ radial pulse. Incision(s): Well approximated. No active bleeding or hematoma.   Dialysis duplex on 11/05/2021 Findings:  +--------------------+----------+-----------------+--------+  AVF                 PSV (cm/s)Flow Vol (mL/min)Comments  +--------------------+----------+-----------------+--------+  Native artery inflow   166           674                 +--------------------+----------+-----------------+--------+  AVF Anastomosis        682                               +--------------------+----------+-----------------+--------+      +------------+----------+-------------+----------+----------------+  OUTFLOW VEINPSV (cm/s)Diameter (cm)Depth (cm)    Describe      +------------+----------+-------------+----------+----------------+  Shoulder       112        0.62        0.46                     +------------+----------+-------------+----------+----------------+  Prox UA        126        0.63        0.42                     +------------+----------+-------------+----------+----------------+  Mid UA         157        0.75        0.43   competing branch  +------------+----------+-------------+----------+----------------+  Dist UA        129        0.83        0.23                     +------------+----------+-------------+----------+----------------+  AC Fossa                  0.45        0.46                     +------------+----------+-------------+----------+----------------+      Summary:  Patent right arteriovenous fistula.   *See table(s) above for measurements and observations.     Diagnosing physician: Servando Snare MD      --------------------------------------------------------------------  -----      Preliminary    Assessment/Plan:   -pt does not have evidence of steal syndrome -  dialysis duplex today reveals fistula to be of adequate diameter and depth for access -the fistula/graft may be used starting January 16, 2022  Barbie Banner, PA-C 11/05/2021 11:39 AM [facility] [phone]  Clinic MD:  Dr. Donzetta Matters

## 2021-11-11 ENCOUNTER — Emergency Department (HOSPITAL_COMMUNITY)
Admission: EM | Admit: 2021-11-11 | Discharge: 2021-11-11 | Disposition: A | Discharge status: Home / Self Care | Payer: No Typology Code available for payment source | Attending: Emergency Medicine | Admitting: Emergency Medicine

## 2021-11-11 ENCOUNTER — Other Ambulatory Visit: Payer: Self-pay

## 2021-11-11 ENCOUNTER — Emergency Department (HOSPITAL_COMMUNITY): Payer: No Typology Code available for payment source

## 2021-11-11 DIAGNOSIS — R011 Cardiac murmur, unspecified: Secondary | ICD-10-CM | POA: Insufficient documentation

## 2021-11-11 DIAGNOSIS — I12 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 disease: Secondary | ICD-10-CM | POA: Insufficient documentation

## 2021-11-11 DIAGNOSIS — Z992 Dependence on renal dialysis: Secondary | ICD-10-CM | POA: Insufficient documentation

## 2021-11-11 DIAGNOSIS — N9489 Other specified conditions associated with female genital organs and menstrual cycle: Secondary | ICD-10-CM | POA: Insufficient documentation

## 2021-11-11 DIAGNOSIS — N186 End stage renal disease: Secondary | ICD-10-CM | POA: Insufficient documentation

## 2021-11-11 DIAGNOSIS — E1122 Type 2 diabetes mellitus with diabetic chronic kidney disease: Secondary | ICD-10-CM | POA: Insufficient documentation

## 2021-11-11 DIAGNOSIS — R059 Cough, unspecified: Secondary | ICD-10-CM | POA: Insufficient documentation

## 2021-11-11 DIAGNOSIS — K219 Gastro-esophageal reflux disease without esophagitis: Secondary | ICD-10-CM | POA: Insufficient documentation

## 2021-11-11 DIAGNOSIS — Z20822 Contact with and (suspected) exposure to covid-19: Secondary | ICD-10-CM | POA: Insufficient documentation

## 2021-11-11 DIAGNOSIS — R072 Precordial pain: Secondary | ICD-10-CM | POA: Insufficient documentation

## 2021-11-11 DIAGNOSIS — Z79899 Other long term (current) drug therapy: Secondary | ICD-10-CM | POA: Insufficient documentation

## 2021-11-11 DIAGNOSIS — R079 Chest pain, unspecified: Secondary | ICD-10-CM

## 2021-11-11 LAB — BASIC METABOLIC PANEL
Anion gap: 18 — ABNORMAL HIGH (ref 5–15)
BUN: 20 mg/dL (ref 6–20)
CO2: 25 mmol/L (ref 22–32)
Calcium: 9.7 mg/dL (ref 8.9–10.3)
Chloride: 91 mmol/L — ABNORMAL LOW (ref 98–111)
Creatinine, Ser: 4.07 mg/dL — ABNORMAL HIGH (ref 0.44–1.00)
GFR, Estimated: 12 mL/min — ABNORMAL LOW (ref >60)
Glucose, Bld: 75 mg/dL (ref 70–99)
Potassium: 3 mmol/L — ABNORMAL LOW (ref 3.5–5.1)
Sodium: 134 mmol/L — ABNORMAL LOW (ref 135–145)

## 2021-11-11 LAB — CBC
HCT: 37.9 % (ref 36.0–46.0)
Hemoglobin: 13 g/dL (ref 12.0–15.0)
MCH: 30.2 pg (ref 26.0–34.0)
MCHC: 34.3 g/dL (ref 30.0–36.0)
MCV: 87.9 fL (ref 80.0–100.0)
Platelets: 174 10*3/uL (ref 150–400)
RBC: 4.31 MIL/uL (ref 3.87–5.11)
RDW: 13.7 % (ref 11.5–15.5)
WBC: 7.5 10*3/uL (ref 4.0–10.5)
nRBC: 0 % (ref 0.0–0.2)

## 2021-11-11 LAB — RESP PANEL BY RT-PCR (FLU A&B, COVID) ARPGX2
Influenza A by PCR: NEGATIVE
Influenza B by PCR: NEGATIVE
SARS Coronavirus 2 by RT PCR: NEGATIVE

## 2021-11-11 LAB — TROPONIN I (HIGH SENSITIVITY)
Troponin I (High Sensitivity): 9 ng/L (ref <18)
Troponin I (High Sensitivity): 9 ng/L (ref <18)

## 2021-11-11 LAB — I-STAT BETA HCG BLOOD, ED (MC, WL, AP ONLY): I-stat hCG, quantitative: 10 m[IU]/mL — ABNORMAL HIGH (ref <5)

## 2021-11-11 MED ORDER — LIDOCAINE VISCOUS HCL 2 % MT SOLN
15.0000 mL | Freq: Once | OROMUCOSAL | Status: AC
  Administered 2021-11-11: 15 mL via ORAL
  Filled 2021-11-11: qty 15

## 2021-11-11 MED ORDER — ALUM & MAG HYDROXIDE-SIMETH 200-200-20 MG/5ML PO SUSP
30.0000 mL | Freq: Once | ORAL | Status: AC
  Administered 2021-11-11: 30 mL via ORAL
  Filled 2021-11-11: qty 30

## 2021-11-11 MED ORDER — ACETAMINOPHEN 325 MG PO TABS
650.0000 mg | ORAL_TABLET | Freq: Once | ORAL | Status: DC | PRN
  Filled 2021-11-11: qty 2

## 2021-11-11 NOTE — ED Triage Notes (Addendum)
BIB EMS from dialysis.  Did finish her dialysis but then has had CP and heaviness in chest and legs.  Pt is restricted in both upper extremities.  Can take BP on left wrist and have blood draws left wrist and below  Pt request tylenol for pain while waiting.

## 2021-11-11 NOTE — ED Provider Notes (Signed)
Emergency Medicine Provider Triage Evaluation Note  Sabrina Mitchell , a 60 y.o. female  was evaluated in triage.  Pt complains of chest pain at dialysis.  Review of Systems  Positive: Chest pain Negative: Fever  Physical Exam  BP 123/68 (BP Location: Right Arm)   Pulse 91   Temp 98.9 F (37.2 C) (Oral)   Resp 20   Ht 4\' 11"  (1.499 m)   Wt 54 kg   LMP 02/11/2012 Comment: tubal ligation  SpO2 100%   BMI 24.04 kg/m  Gen:   Awake, no distress   Resp:  Normal effort  MSK:   Moves extremities without difficulty  Other:    Medical Decision Making  Medically screening exam initiated at 5:02 PM.  Appropriate orders placed.  Angelique Holm was informed that the remainder of the evaluation will be completed by another provider, this initial triage assessment does not replace that evaluation, and the importance of remaining in the ED until their evaluation is complete.     Hayden Rasmussen, MD 11/11/21 [phone]

## 2021-11-11 NOTE — ED Notes (Signed)
ED Provider at bedside. 

## 2021-11-11 NOTE — ED Provider Notes (Signed)
[facility] EMERGENCY DEPARTMENT Provider Note   CSN: [id number] Arrival date & time: 11/11/21  1644     History Chief Complaint  Patient presents with   Chest Pain    Sabrina Mitchell is a 60 y.o. female.  The history is provided by the patient, a relative and medical records. No language interpreter was used.  Chest Pain Pain location:  Substernal area Pain quality: aching and burning   Pain radiates to:  Does not radiate Pain severity:  Moderate Onset quality:  Gradual Duration:  1 day Timing:  Constant Progression:  Improving Chronicity:  Recurrent Context comment:  During dialysis Relieved by:  Nothing Worsened by:  Nothing Ineffective treatments:  None tried Associated symptoms: cough and heartburn   Associated symptoms: no abdominal pain, no altered mental status, no back pain, no diaphoresis, no dizziness, no fatigue, no fever, no headache, no lower extremity edema, no nausea, no near-syncope, no numbness, no palpitations, no shortness of breath, no vomiting and no weakness       Past Medical History:  Diagnosis Date   Anemia    Anxiety    Complication of anesthesia    Depression    denies   End stage renal disease ([place])    hemodialysis 3x/wk @ [place]   Family history of adverse reaction to anesthesia    sister vomiting after uterine cyst removal   Gastritis    GERD (gastroesophageal reflux disease)    Hemorrhoids    Hypertension    Pneumonia    years ago   PONV (postoperative nausea and vomiting)    UTI (lower urinary tract infection) March 2014    Patient Active Problem List   Diagnosis Date Noted   Psychophysiological insomnia 02/13/2021   New onset of headaches 02/13/2021   Depression with anxiety 10/30/2020   Pulmonary edema 09/26/2019   Chest pain 09/26/2019   Elevated troponin 09/26/2019   Shortness of breath 05/16/2019   Calcaneal spur of right foot 10/10/2018   Ulnar neuropathy of left upper  extremity 03/11/2018   GERD (gastroesophageal reflux disease) 01/17/2016   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[place]) 10/23/2015   Dependence on renal dialysis ([place]) 10/15/2015   Type 2 diabetes mellitus with diabetic peripheral angiopathy without gangrene ([place]) 04/24/2015   Pruritus, unspecified 12/26/2014   Coagulation defect, unspecified ([place]) 12/18/2014   Type 2 diabetes mellitus with other diabetic kidney complication ([place]) 42/59/5638   Diarrhea, unspecified 09/13/2014   Fever, unspecified 08/30/2014   Pain, unspecified 08/30/2014   Generalized abdominal pain 04/05/2013   Post-menopausal bleeding 04/06/2012   End stage renal disease on dialysis ([place]) 04/06/2012   Hypertension 04/06/2012   Anemia in chronic kidney disease 03/10/2012   Secondary hyperparathyroidism of renal origin ([place]) 03/10/2012   Iron deficiency anemia, unspecified 01/23/2012   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 disease ([place]) 03/25/2010    Past Surgical History:  Procedure Laterality Date   AV FISTULA PLACEMENT Left 07/05/09   Dr. Kellie Simmering   AV FISTULA PLACEMENT Right 09/26/2021   Procedure: RIGHT ARM ARTERIOVENOUS (AV) FISTULA CREATION;  Surgeon: Waynetta Sandy, MD;  Location: [facility];  Service: Vascular;  Laterality: Right;   COLONOSCOPY N/A 02/10/2016   SLF: 1. HEME postive stool due to colon polyps intrernal hemorrhoids 2. small internal hemrrohoids 3. moderate external hemorrhoids.    ESOPHAGEAL DILATION N/A 02/10/2016   Procedure: ESOPHAGEAL DILATION;  Surgeon: Danie Binder, MD;  Location: [facility]  ENDO SUITE;  Service: Endoscopy;  Laterality: N/A;   ESOPHAGOGASTRODUODENOSCOPY N/A 02/10/2016   SLF: patent Schatzki's ring , mild gastritis/ duodentitis.    FISTULA SUPERFICIALIZATION Left 44/08/6758   Procedure: PLICATION BRACHIOCEPHALIC FISTULA;  Surgeon: Angelia Mould, MD;  Location: [facility];  Service: Vascular;  Laterality: Left;    FISTULOGRAM N/A 10/22/2014   Procedure: FISTULOGRAM;  Surgeon: Angelia Mould, MD;  Location: [facility] CATH LAB;  Service: Cardiovascular;  Laterality: N/A;   INSERTION OF DIALYSIS CATHETER Right 08/27/2021   Procedure: INSERTION OF 19cm TUNNELED DIALYSIS CATHETER INTO RIGHT NECK;  Surgeon: Marty Heck, MD;  Location: [facility];  Service: Vascular;  Laterality: Right;   IR DIALY SHUNT INTRO NEEDLE/INTRACATH INITIAL W/IMG LEFT Left 02/01/2019   IR DIALY SHUNT INTRO NEEDLE/INTRACATH INITIAL W/IMG LEFT Left 06/05/2020   IR DIALY SHUNT INTRO NEEDLE/INTRACATH INITIAL W/IMG LEFT Left 05/09/2021   IR THROMBECTOMY AV FISTULA W/THROMBOLYSIS/PTA INC/SHUNT/IMG LEFT Left 08/18/2021   IR US GUIDE VASC ACCESS LEFT  08/18/2021   PERIPHERAL VASCULAR BALLOON ANGIOPLASTY Left 07/14/2021   Procedure: PERIPHERAL VASCULAR BALLOON ANGIOPLASTY;  Surgeon: Waynetta Sandy, MD;  Location: [facility] CV LAB;  Service: Cardiovascular;  Laterality: Left;   PERIPHERAL VASCULAR CATHETERIZATION Left 12/09/2016   Procedure: A/V Fistulagram;  Surgeon: Waynetta Sandy, MD;  Location: [facility] CV LAB;  Service: Cardiovascular;  Laterality: Left;   REVISON OF ARTERIOVENOUS FISTULA Left 16/38/4665   Procedure: PLICATION OF BRACHIOCEPHALIC FISTULA;  Surgeon: Conrad [place], MD;  Location: [facility];  Service: Vascular;  Laterality: Left;   REVISON OF ARTERIOVENOUS FISTULA Left 08/09/2017   Procedure: REVISION OF ARTERIOVENOUS FISTULA LIGATION OF SIDE BRANCH;  Surgeon: Waynetta Sandy, MD;  Location: [place] [facility];  Service: Vascular;  Laterality: Left;   TUBAL LIGATION  2004     OB History     Gravida  5   Para      Term      Preterm      AB  3   Living  2      SAB  3   IAB      Ectopic      Multiple      Live Births  2           Family History  Problem Relation Age of Onset   Hypertension Mother    Hypertension Father    Hypertension Sister    Breast cancer Sister 80   Diabetes  Brother    Hypertension Brother     Social History   Tobacco Use   Smoking status: Never   Smokeless tobacco: Never  Vaping Use   Vaping Use: Never used  Substance Use Topics   Alcohol use: No   Drug use: No    Home Medications Prior to Admission medications   Medication Sig Start Date End Date Taking? Authorizing Provider  acetaminophen (TYLENOL) 500 MG tablet Take 500-1,000 mg by mouth every 6 (six) hours as needed for moderate pain or headache.    [provider]  albuterol (VENTOLIN HFA) 108 (90 Base) MCG/ACT inhaler INHALE 2 PUFF USING INHALER EVERY FOUR TO SIX HOURS WHILE AWAKE AS NEEDED FOR WHEEZING, COUGH, OR SHORTNESS OF BREATH 02/20/21 02/20/22  Loren Racer, PA-C  amLODipine (NORVASC) 5 MG tablet Take 1 tablet by mouth twice a day for high blood pressure Patient taking differently: Take 5 mg by mouth See admin instructions. Take 5 mg daily, may take a second 5 mg dose as needed for  high bp 05/22/21     B Complex-C-Folic Acid (DIALYVITE 563) 0.8 MG TABS Take 1 tablet by mouth daily in the afternoon. 09/06/19   [provider]  carvedilol (COREG) 6.25 MG tablet TAKE 1 TABLET BY MOUTH ONCE A DAY AS DIRECTED FOR HIGH BLOOD PRESSURE Patient taking differently: Take 6.25 mg by mouth in the morning. Take additional 6.25 mg in the evening if blood pressure is high 01/30/21 01/30/22  Penninger, Ria Comment, PA  carvedilol (COREG) 6.25 MG tablet Take 1 tablet (6.25 mg total) by mouth 2 (two) times daily. 09/23/21   Penninger, Ria Comment, PA  Doxercalciferol (HECTOROL IV) Doxercalciferol (Hectorol) 10/21/21 10/20/22  [provider]  ethyl chloride spray APPLY A SMALL AMOUNT OF SPRAY TO DIALYSIS ACCESS JUST BEFORE NEEDLE STICK THREE TIMES A WEEK AS DIRECTED Patient taking differently: Apply 1 application topically daily as needed (port access). 08/19/21 08/19/22  Lynnda Child, PA-C  ethyl chloride spray Spray 1 as directed to skin three times a week as directed  apply a small amount of spray to dialysis access just before needle stick 08/19/21     ferric citrate (AURYXIA) 1 GM 210 MG(Fe) tablet Take 420-630 mg by mouth See admin instructions. Take 420 - 630 mg with each meal or snack    [provider]  heparin 1000 unit/mL SOLN injection Heparin Sodium (Porcine) 1,000 Units/mL Catheter Lock Arterial 09/25/21 09/24/22  [provider]  LOPERAMIDE HCL PO Take by mouth. 09/02/21 09/01/22  [provider]  nystatin-triamcinolone ointment (MYCOLOG) Apply 1 application topically 2 (two) times daily. 10/06/21   Ladell Pier, MD  Omega-3 Fatty Acids (OMEGA 3 500) 500 MG CAPS Take 500 mg by mouth every other day.    [provider]  omeprazole (PRILOSEC) 20 MG capsule Take 1 capsule (20 mg total) by mouth daily. 1 PO 30 mins prior to breakfast and supper Patient taking differently: Take 20 mg by mouth daily as needed (heartburn). 08/06/17   Ladell Pier, MD  oxyCODONE-acetaminophen (PERCOCET) 5-325 MG tablet Take 1 tablet by mouth every 4 (four) hours as needed for severe pain. 09/26/21 09/26/22  Waynetta Sandy, MD  Vitamin D, Ergocalciferol, (DRISDOL) 1.25 MG (50000 UNIT) CAPS capsule Take 1 capsule (50,000 Units total) by mouth every 7 (seven) days. 06/21/21   Ladell Pier, MD  zolpidem (AMBIEN) 5 MG tablet Take 1 tablet (5 mg total) by mouth at bedtime as needed for sleep. 10/24/21   Ladell Pier, MD    Allergies    Covid-19 (mrna) vaccine, Morphine and related, Remeron [mirtazapine], Zoloft [sertraline], Amitriptyline, and Trazodone and nefazodone  Review of Systems   Review of Systems  Constitutional:  Negative for chills, diaphoresis, fatigue and fever.  HENT:  Negative for congestion.   Respiratory:  Positive for cough. Negative for chest tightness and shortness of breath.   Cardiovascular:  Positive for chest pain. Negative for palpitations and near-syncope.  Gastrointestinal:  Positive for  heartburn. Negative for abdominal pain, constipation, diarrhea, nausea and vomiting.  Genitourinary:  Negative for dysuria, flank pain and frequency.  Musculoskeletal:  Negative for back pain, neck pain and neck stiffness.  Skin:  Negative for rash and wound.  Neurological:  Negative for dizziness, weakness, light-headedness, numbness and headaches.  Psychiatric/Behavioral:  Negative for agitation and confusion.   All other systems reviewed and are negative.  Physical Exam Updated Vital Signs BP 112/68   Pulse 77   Temp 98.9 F (37.2 C) (Oral)   Resp 17  Ht 4\' 11"  (1.499 m)   Wt 54 kg   LMP 02/11/2012 Comment: tubal ligation  SpO2 100%   BMI 24.04 kg/m   Physical Exam Vitals and nursing note reviewed.  Constitutional:      General: She is not in acute distress.    Appearance: She is well-developed. She is not ill-appearing, toxic-appearing or diaphoretic.  HENT:     Head: Normocephalic and atraumatic.  Eyes:     Extraocular Movements: Extraocular movements intact.     Conjunctiva/sclera: Conjunctivae normal.     Pupils: Pupils are equal, round, and reactive to light.  Cardiovascular:     Rate and Rhythm: Normal rate and regular rhythm.     Heart sounds: Normal heart sounds. No murmur heard. Pulmonary:     Effort: Pulmonary effort is normal. No respiratory distress.     Breath sounds: Normal breath sounds. No decreased breath sounds, wheezing, rhonchi or rales.  Chest:     Chest wall: No tenderness.  Abdominal:     Palpations: Abdomen is soft.     Tenderness: There is no abdominal tenderness.  Musculoskeletal:     Cervical back: Neck supple.     Right lower leg: No edema.     Left lower leg: No edema.  Skin:    General: Skin is warm and dry.     Capillary Refill: Capillary refill takes less than 2 seconds.     Findings: No erythema.  Neurological:     General: No focal deficit present.     Mental Status: She is alert.  Psychiatric:        Mood and Affect: Mood  normal.    ED Results / Procedures / Treatments   Labs (all labs ordered are listed, but only abnormal results are displayed) Labs Reviewed  BASIC METABOLIC PANEL - Abnormal; Notable for the following components:      Result Value   Sodium 134 (*)    Potassium 3.0 (*)    Chloride 91 (*)    Creatinine, Ser 4.07 (*)    GFR, Estimated 12 (*)    Anion gap 18 (*)    All other components within normal limits  I-STAT BETA HCG BLOOD, ED ([facility], [facility], [facility] ONLY) - Abnormal; Notable for the following components:   I-stat hCG, quantitative 10.0 (*)    All other components within normal limits  RESP PANEL BY RT-PCR (FLU A&B, COVID) ARPGX2  CBC  TROPONIN I (HIGH SENSITIVITY)  TROPONIN I (HIGH SENSITIVITY)    EKG EKG Interpretation  Date/Time:  Tuesday November 11 2021 16:47:42 EST Ventricular Rate:  83 PR Interval:  154 QRS Duration: 100 QT Interval:  392 QTC Calculation: 460 R Axis:   46 Text Interpretation: Normal sinus rhythm Minimal voltage criteria for LVH, may be normal variant ( Sokolow-Lyon ) Nonspecific ST and T wave abnormality Abnormal ECG No significant change since prior 2/22 Confirmed by Aletta Edouard [phone]) on 11/11/2021 5:03:26 PM  Radiology DG Chest 1 View  Result Date: 11/11/2021 CLINICAL DATA:  Chest pain. EXAM: CHEST  1 VIEW COMPARISON:  August 27, 2021 FINDINGS: Stable right-sided venous catheter positioning is noted. The heart size and mediastinal contours are within normal limits. Both lungs are clear. The visualized skeletal structures are unremarkable. IMPRESSION: No active cardiopulmonary disease. Electronically Signed   By: Virgina Norfolk M.D.   On: 11/11/2021 17:31    Procedures Procedures   Medications Ordered in ED Medications  acetaminophen (TYLENOL) tablet 650 mg (has no administration in time  range)  alum & mag hydroxide-simeth (MAALOX/MYLANTA) 200-200-20 MG/5ML suspension 30 mL (30 mLs Oral Given 11/11/21 2105)    And  lidocaine (XYLOCAINE) 2 %  viscous mouth solution 15 mL (15 mLs Oral Given 11/11/21 2105)    ED Course  I have reviewed the triage vital signs and the nursing notes.  Pertinent labs & imaging results that were available during my care of the patient were reviewed by me and considered in my medical decision making (see chart for details).    MDM Rules/Calculators/A&P                           LAVENIA STUMPO is a 60 y.o. female with a past medical history significant for ESRD, hypertension, diabetes, GERD, esophageal ring with previous stretching, and previous esophageal pain who presents with chest pain during dialysis.  According to patient, she has had cough recently but then during dialysis today started having chest discomfort.  She has had this before.  She said it is her upper central chest.  It is not pleuritic or exertional.  She denies shortness of breath, nausea, vomiting, constipation, diarrhea, urinary changes.  He does not go down her arms or to the back.  It is moderate.  She is tearful.  She said that they took a large amount of fluid off today.  No recent trauma.  On exam, lungs clear and chest is slightly tender to palpation.  Abdomen nontender.  No murmur.  No stridor.  Legs are not critically edematous and patient otherwise well-appearing.  EKG does not show STEMI.  Patient was given a GI cocktail as I suspect it more be esophageal given her similar pain in the past.  Patient had work-up started in triage which showed a negative troponin x2 and reassuring chest x-ray.  Her vital signs were not tachycardic, tachypneic, and she maintain oxygen saturations.  She was not hypertensive or hypotensive initially and was well-appearing.  Had a conversation with patient that I suspect it is more esophageal versus related to the fluid shifts causing muscle pains.  Patient was given a GI cocktail that nearly resolved her symptoms and she felt much better.  After observation and p.o. challenge, she feels  better would like to go home.  Patient will call her PCP for follow-up given her reassuring work-up.  She no other questions or concerns and was discharged in good condition.    Final Clinical Impression(s) / ED Diagnoses Final diagnoses:  Chest pain, unspecified type    Clinical Impression: 1. Chest pain, unspecified type   2. Chest pain     Disposition: Admit  This note was prepared with assistance of Dragon voice recognition software. Occasional wrong-word or sound-a-like substitutions may have occurred due to the inherent limitations of voice recognition software.     Mellony Danziger, Gwenyth Allegra, MD 11/11/21 2308

## 2021-11-11 NOTE — Discharge Instructions (Signed)
Your work-up today was overall reassuring.  Your heart enzymes were negative both times we checked them your chest x-ray and EKG were also reassuring.  As your symptoms improved after the GI cocktail I do suspect this was more esophageal in nature as it has been in the past versus related to the fluid shifts from your dialysis today.  Either way, your symptoms have improved and your work-up was reassuring and your vital signs are also reassuring.  We feel you are safe for discharge home.  Please follow-up with your primary doctor in the next several days and if any symptoms change or worsen, please return to the nearest emergency department.  Please continue taking medications for your reflux and rest and stay hydrated.

## 2021-11-13 ENCOUNTER — Other Ambulatory Visit: Payer: Self-pay

## 2021-12-01 ENCOUNTER — Ambulatory Visit: Payer: Self-pay | Attending: Internal Medicine | Admitting: Internal Medicine

## 2021-12-01 ENCOUNTER — Other Ambulatory Visit: Payer: Self-pay

## 2021-12-01 DIAGNOSIS — F5104 Psychophysiologic insomnia: Secondary | ICD-10-CM

## 2021-12-01 MED ORDER — ZOLPIDEM TARTRATE 10 MG PO TABS: 10.0000 mg | ORAL_TABLET | Freq: Every evening | ORAL | 1 refills | Status: DC | PRN

## 2021-12-01 NOTE — Progress Notes (Signed)
Virtual Visit via Telephone Note  I connected with Sabrina Mitchell on 12/01/2021 at 8:20 AM by telephone and verified that I am speaking with the correct person using two identifiers  Location: Patient: home Provider: office  Participants: Myself Patient [place] interpreter: Beverlee Nims [phone]   I discussed the limitations, risks, security and privacy concerns of performing an evaluation and management service by telephone and the availability of in person appointments. I also discussed with the patient that there may be a patient responsible charge related to this service. The patient expressed understanding and agreed to proceed.   History of Present Illness: 60 year old with ESRD on HD, HTN, ACD, GERD, vit D def and insomnia.  Last seen 09/2021.  This is and UC visit.  Pt requesting increase dose of Ambien from 5 mg. Reports the 5 mg no longer helping with sleep maintenance. I reminded her in past that she was on 10 mg and had a S.E from it.  Pt states what happened in that instance was that she accidentally took the 10 mg tab twice in 1 day.  Never happened again but reports the 10 mg worked better.  Reports she does not take the med every day.  Usually takes only on days she does not have dialysis.  On HD days she comes home very tired and able to sleep without it.   Outpatient Encounter Medications as of 12/01/2021  Medication Sig Note   acetaminophen (TYLENOL) 500 MG tablet Take 500-1,000 mg by mouth every 6 (six) hours as needed for moderate pain or headache.    albuterol (VENTOLIN HFA) 108 (90 Base) MCG/ACT inhaler INHALE 2 PUFF USING INHALER EVERY FOUR TO SIX HOURS WHILE AWAKE AS NEEDED FOR WHEEZING, COUGH, OR SHORTNESS OF BREATH    amLODipine (NORVASC) 5 MG tablet Take 1 tablet by mouth twice a day for high blood pressure (Patient taking differently: Take 5 mg by mouth See admin instructions. Take 5 mg daily, may take a second 5 mg dose as needed for high bp)    B Complex-C-Folic  Acid (DIALYVITE 800) 0.8 MG TABS Take 1 tablet by mouth daily in the afternoon.    carvedilol (COREG) 6.25 MG tablet TAKE 1 TABLET BY MOUTH ONCE A DAY AS DIRECTED FOR HIGH BLOOD PRESSURE (Patient taking differently: Take 6.25 mg by mouth in the morning. Take additional 6.25 mg in the evening if blood pressure is high)    carvedilol (COREG) 6.25 MG tablet Take 1 tablet (6.25 mg total) by mouth 2 (two) times daily.    Doxercalciferol (HECTOROL IV) Doxercalciferol (Hectorol)    ethyl chloride spray APPLY A SMALL AMOUNT OF SPRAY TO DIALYSIS ACCESS JUST BEFORE NEEDLE STICK THREE TIMES A WEEK AS DIRECTED (Patient taking differently: Apply 1 application topically daily as needed (port access).)    ethyl chloride spray Spray 1 as directed to skin three times a week as directed apply a small amount of spray to dialysis access just before needle stick    ferric citrate (AURYXIA) 1 GM 210 MG(Fe) tablet Take 420-630 mg by mouth See admin instructions. Take 420 - 630 mg with each meal or snack 09/24/2021: Depends on the size of the meal / snack if she takes 2 or 3   heparin 1000 unit/mL SOLN injection Heparin Sodium (Porcine) 1,000 Units/mL Catheter Lock Arterial    LOPERAMIDE HCL PO Take by mouth.    nystatin-triamcinolone ointment (MYCOLOG) Apply 1 application topically 2 (two) times daily.    Omega-3 Fatty Acids (OMEGA 3  500) 500 MG CAPS Take 500 mg by mouth every other day.    omeprazole (PRILOSEC) 20 MG capsule Take 1 capsule (20 mg total) by mouth daily. 1 PO 30 mins prior to breakfast and supper (Patient taking differently: Take 20 mg by mouth daily as needed (heartburn).)    oxyCODONE-acetaminophen (PERCOCET) 5-325 MG tablet Take 1 tablet by mouth every 4 (four) hours as needed for severe pain.    Vitamin D, Ergocalciferol, (DRISDOL) 1.25 MG (50000 UNIT) CAPS capsule Take 1 capsule (50,000 Units total) by mouth every 7 (seven) days.    zolpidem (AMBIEN) 5 MG tablet Take 1 tablet (5 mg total) by mouth at  bedtime as needed for sleep.    No facility-administered encounter medications on file as of 12/01/2021.      Observations/Objective: No direct observation done as this was a telephone visit.  Assessment and Plan: 1. Chronic insomnia -Advised patient that the highest recommended dose in females is 5 mg.  Also advised that tolerance can develop after regular use as it has for the 5 mg tablet.  Patient still feels that the 10 mg will work better for her.  Advised that she takes it only when needed and try not to take it every night.  Take when she can devote 7 to 8 hours to sleep.  Informed that the medication can have side effects including increased daytime drowsiness, amnesia to events.  I have reviewed [place] controlled substance reporting system.  We will go ahead and try her with a 10 mg tablet. - zolpidem (AMBIEN) 10 MG tablet; Take 1 tablet (10 mg total) by mouth at bedtime as needed for sleep.  Dispense: 30 tablet; Refill: 1   Follow Up Instructions: As previously scheduled.   I discussed the assessment and treatment plan with the patient. The patient was provided an opportunity to ask questions and all were answered. The patient agreed with the plan and demonstrated an understanding of the instructions.   The patient was advised to call back or seek an in-person evaluation if the symptoms worsen or if the condition fails to improve as anticipated.  I  Spent 19 minutes on this telephone encounter  Karle Plumber, MD

## 2021-12-10 ENCOUNTER — Other Ambulatory Visit: Payer: Self-pay

## 2022-01-09 ENCOUNTER — Other Ambulatory Visit: Payer: Self-pay

## 2022-01-14 ENCOUNTER — Ambulatory Visit: Payer: No Typology Code available for payment source

## 2022-01-16 ENCOUNTER — Ambulatory Visit: Payer: Self-pay

## 2022-01-28 ENCOUNTER — Other Ambulatory Visit: Payer: Self-pay

## 2022-01-28 MED ORDER — CARVEDILOL 12.5 MG PO TABS
ORAL_TABLET | ORAL | 3 refills | Status: DC
  Filled 2022-01-28: qty 180, 90d supply, fill #0
  Filled 2022-04-28: qty 60, 30d supply, fill #1
  Filled 2022-05-27: qty 60, 30d supply, fill #2
  Filled 2022-06-29: qty 60, 30d supply, fill #3
  Filled 2022-07-31: qty 60, 30d supply, fill #4
  Filled 2022-09-01: qty 60, 30d supply, fill #5
  Filled 2022-09-30: qty 60, 30d supply, fill #6
  Filled 2022-10-26: qty 60, 30d supply, fill #7
  Filled 2022-11-27: qty 60, 30d supply, fill #8
  Filled 2022-12-25: qty 60, 30d supply, fill #9

## 2022-02-09 ENCOUNTER — Other Ambulatory Visit: Payer: Self-pay

## 2022-02-10 ENCOUNTER — Ambulatory Visit: Payer: Self-pay | Admitting: *Deleted

## 2022-02-10 ENCOUNTER — Other Ambulatory Visit: Payer: Self-pay | Admitting: Internal Medicine

## 2022-02-10 DIAGNOSIS — F5104 Psychophysiologic insomnia: Secondary | ICD-10-CM

## 2022-02-10 NOTE — Telephone Encounter (Signed)
°  Chief Complaint: Heart palpitations Symptoms: "Heart goes fast then slow at times. Mostly at night." Frequency: 8-9 weeks. None presently Pertinent Negatives: Patient denies dizziness, CP, nausea dizziness Disposition: [] ED /[] Urgent Care (no appt availability in office) / [] Appointment(In office/virtual)/ []  [facility]/ [] Home Care/ [] Refused Recommended Disposition /[] [facility]/ [x]  Follow-up with PCP Additional Notes: Pt states "Kidney doctor suggested PCP set up halter monitor." States at appt, "Heart beating hard." Assured pt NT would route to practice for PCPs review and final disposition. Advised ED for worsening symptoms. Please advise. Will need Interpreter for CB     Reason for Disposition  Palpitations are a chronic symptom (recurrent or ongoing AND present > 4 weeks)  Answer Assessment - Initial Assessment Questions 1. DESCRIPTION: "Please describe your heart rate or heartbeat that you are having" (e.g., fast/slow, regular/irregular, skipped or extra beats, "palpitations")     Skipping at night sometimes 2. ONSET: "When did it start?" (Minutes, hours or days)      5-6 weeks ago 3. DURATION: "How long does it last" (e.g., seconds, minutes, hours)     Goes low and then high at times. BP meds been adjusted many times 4. PATTERN "Does it come and go, or has it been constant since it started?"  "Does it get worse with exertion?"   "Are you feeling it now?"       6. HEART RATE: "Can you tell me your heart rate?" "How many beats in 15 seconds?"  (Note: not all patients can do this)       no 7. RECURRENT SYMPTOM: "Have you ever had this before?" If Yes, ask: "When was the last time?" and "What happened that time?"       8. CAUSE: "What do you think is causing the palpitations?"      9. CARDIAC HISTORY: "Do you have any history of heart disease?" (e.g., heart attack, angina, bypass surgery, angioplasty, arrhythmia)       10. OTHER SYMPTOMS: "Do you have  any other symptoms?" (e.g., dizziness, chest pain, sweating, difficulty breathing)  Protocols used: Heart Rate and Heartbeat Questions-A-AH

## 2022-02-10 NOTE — Telephone Encounter (Signed)
Medication Refill - Medication: zolpidem (AMBIEN) 10 MG tablet   Has the patient contacted their pharmacy? Yes.   (Agent: If no, request that the patient contact the pharmacy for the refill. If patient does not wish to contact the pharmacy document the reason why and proceed with request.) (Agent: If yes, when and what did the pharmacy advise?)  Preferred Pharmacy (with phone number or street name):  [facility] ([place]), [place] - [address]  [address] [place] ([place]) [place] [address]  Phone: [phone] Fax: [phone]   Has the patient been seen for an appointment in the last year OR does the patient have an upcoming appointment? Yes.    Agent: Please be advised that RX refills may take up to 3 business days. We ask that you follow-up with your pharmacy.

## 2022-02-11 ENCOUNTER — Ambulatory Visit: Payer: No Typology Code available for payment source

## 2022-02-11 MED ORDER — ZOLPIDEM TARTRATE 10 MG PO TABS: 10.0000 mg | ORAL_TABLET | Freq: Every evening | ORAL | 1 refills | Status: DC | PRN

## 2022-02-11 NOTE — Telephone Encounter (Signed)
Requested medications are due for refill today.  yes  Requested medications are on the active medications list.  yes  Last refill. 12/01/2021 #30 1 refill  Future visit scheduled.   yes  Notes to clinic.  Medication not delegated.    Requested Prescriptions  Pending Prescriptions Disp Refills   zolpidem (AMBIEN) 10 MG tablet 30 tablet 1    Sig: Take 1 tablet (10 mg total) by mouth at bedtime as needed for sleep.     Not Delegated - Psychiatry:  Anxiolytics/Hypnotics Failed - 02/10/2022  2:35 PM      Failed - This refill cannot be delegated      Failed - Urine Drug Screen completed in last 360 days      Passed - Valid encounter within last 6 months    Recent Outpatient Visits           2 months ago Chronic insomnia   [place], MD   4 months ago Annual physical exam   [facility] Ladell Pier, MD   7 months ago Need for zoster vaccine   [place], Jarome Matin, RPH-CPP   8 months ago Renal hypertension   [place], MD   1 year ago Encounter for screening mammogram for malignant neoplasm of breast   [place], MD       Future Appointments             In 3 weeks Ladell Pier, MD [facility]

## 2022-02-11 NOTE — Telephone Encounter (Signed)
Tried contacting pt on 02/10/22 to get more information on triage call. Pt didn't answer lvm  Interperter ID [id number] made pt aware that if she having heart palpitations she will need to be evaluated at the ED or Urgent Care making pt aware that she doesn't need to wait till appt and if she has any questions or concerns to give [place] a call

## 2022-02-20 ENCOUNTER — Ambulatory Visit: Payer: Self-pay | Attending: Internal Medicine

## 2022-03-02 IMAGING — CR DG CHEST 1V
1 series · 1 of 1 positions shown · non-contrast
Comparison: August 27, 2021

CLINICAL DATA: Chest pain.

EXAM:
CHEST  1 VIEW

[chest ap]
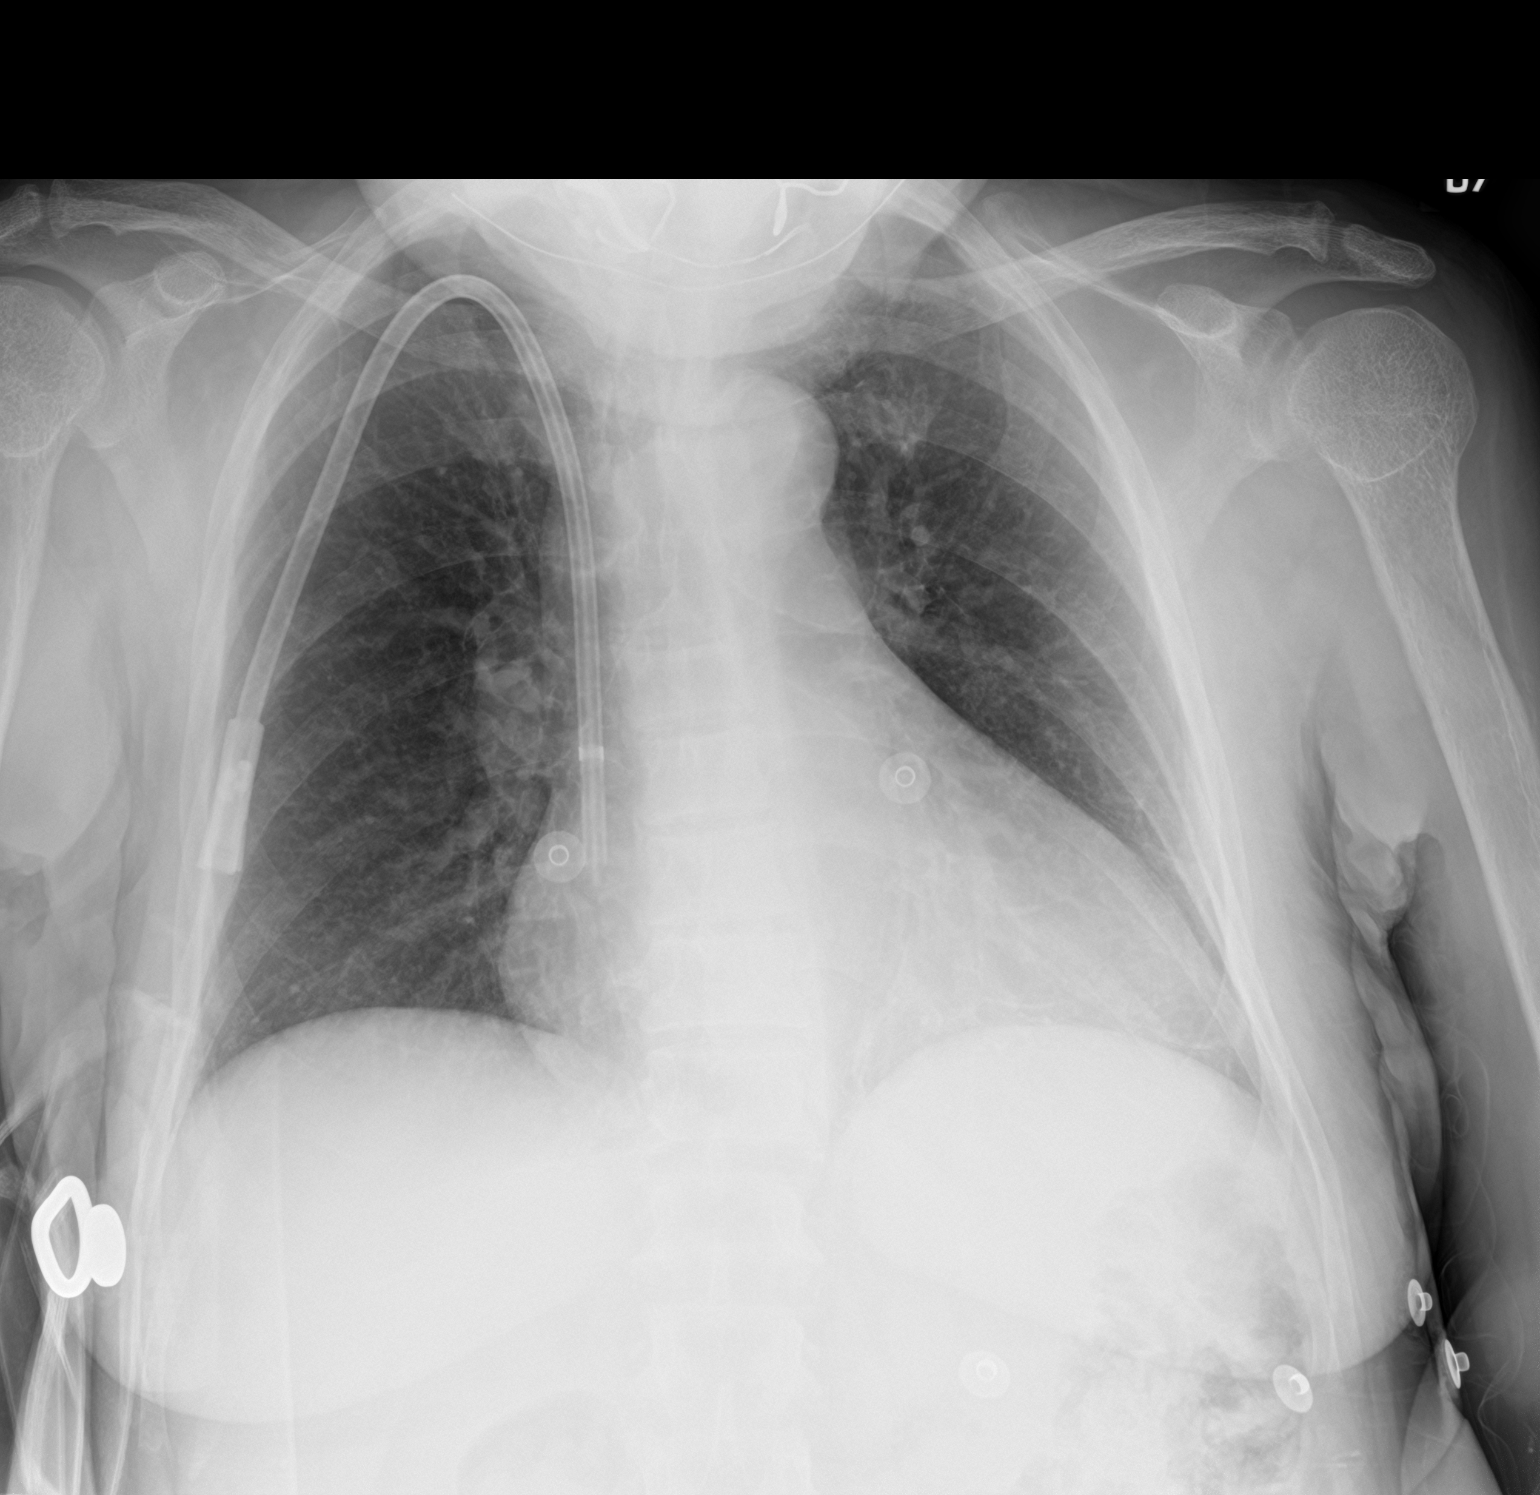

[1 of 1 positions shown; findings below may reference images not displayed]

FINDINGS: Stable right-sided venous catheter positioning is noted. The heart
size and mediastinal contours are within normal limits. Both lungs
are clear. The visualized skeletal structures are unremarkable.
IMPRESSION: No active cardiopulmonary disease.

## 2022-03-06 ENCOUNTER — Encounter: Payer: Self-pay | Admitting: Internal Medicine

## 2022-03-06 ENCOUNTER — Other Ambulatory Visit: Payer: Self-pay

## 2022-03-06 ENCOUNTER — Ambulatory Visit: Payer: Self-pay | Attending: Internal Medicine | Admitting: Internal Medicine

## 2022-03-06 VITALS — BP 119/73 | HR 61 | Resp 16 | Ht 59.0 in | Wt 121.0 lb

## 2022-03-06 DIAGNOSIS — Z992 Dependence on renal dialysis: Secondary | ICD-10-CM

## 2022-03-06 DIAGNOSIS — I12 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 disease: Secondary | ICD-10-CM | POA: Insufficient documentation

## 2022-03-06 DIAGNOSIS — K219 Gastro-esophageal reflux disease without esophagitis: Secondary | ICD-10-CM | POA: Insufficient documentation

## 2022-03-06 DIAGNOSIS — G4700 Insomnia, unspecified: Secondary | ICD-10-CM

## 2022-03-06 DIAGNOSIS — N186 End stage renal disease: Secondary | ICD-10-CM

## 2022-03-06 DIAGNOSIS — Z7901 Long term (current) use of anticoagulants: Secondary | ICD-10-CM | POA: Insufficient documentation

## 2022-03-06 DIAGNOSIS — I10 Essential (primary) hypertension: Secondary | ICD-10-CM

## 2022-03-06 DIAGNOSIS — R002 Palpitations: Secondary | ICD-10-CM

## 2022-03-06 DIAGNOSIS — Z79899 Other long term (current) drug therapy: Secondary | ICD-10-CM | POA: Insufficient documentation

## 2022-03-06 DIAGNOSIS — E559 Vitamin D deficiency, unspecified: Secondary | ICD-10-CM | POA: Insufficient documentation

## 2022-03-06 NOTE — Progress Notes (Signed)
Patient ID: Sabrina Mitchell, female    DOB: 1961-10-23  MRN: [id number]  CC:  chronic ds management  Subjective: Sabrina Mitchell is a 61 y.o. female who presents for chronic ds management Her concerns today include:  61 year old with ESRD on HD, HTN, ACD, GERD, vit D def and insomnia.  ESRD/HTN: Compliant with going to dialysis Tuesday, Thursday and Saturdays.   Gets palpitations "like heart jumping around" during HD sometimes.  Can also occur when not on HD.  Occurs daily and last seconds Compliant with Norvasc and Coreg.  Insomnia:  doing okay on Ambien.  Reports most nights she does not take a full pill. She cuts off a small portion and takes it     Patient Active Problem List   Diagnosis Date Noted   Psychophysiological insomnia 02/13/2021   New onset of headaches 02/13/2021   Depression with anxiety 10/30/2020   Pulmonary edema 09/26/2019   Chest pain 09/26/2019   Elevated troponin 09/26/2019   Shortness of breath 05/16/2019   Calcaneal spur of right foot 10/10/2018   Ulnar neuropathy of left upper extremity 03/11/2018   GERD (gastroesophageal reflux disease) 01/17/2016   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[place]) 10/23/2015   Dependence on renal dialysis ([place]) 10/15/2015   Type 2 diabetes mellitus with diabetic peripheral angiopathy without gangrene ([place]) 04/24/2015   Pruritus, unspecified 12/26/2014   Coagulation defect, unspecified ([place]) 12/18/2014   Type 2 diabetes mellitus with other diabetic kidney complication ([place]) 24/26/8341   Diarrhea, unspecified 09/13/2014   Fever, unspecified 08/30/2014   Pain, unspecified 08/30/2014   Generalized abdominal pain 04/05/2013   Post-menopausal bleeding 04/06/2012   End stage renal disease on dialysis ([place]) 04/06/2012   Hypertension 04/06/2012   Anemia in chronic kidney disease 03/10/2012   Secondary hyperparathyroidism of renal origin ([place]) 03/10/2012   Iron deficiency anemia, unspecified  01/23/2012   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 disease ([place]) 03/25/2010     Current Outpatient Medications on File Prior to Visit  Medication Sig Dispense Refill   acetaminophen (TYLENOL) 500 MG tablet Take 500-1,000 mg by mouth every 6 (six) hours as needed for moderate pain or headache.     amLODipine (NORVASC) 5 MG tablet Take 1 tablet by mouth twice a day for high blood pressure (Patient taking differently: Take 5 mg by mouth See admin instructions. Take 5 mg daily, may take a second 5 mg dose as needed for high bp) 180 tablet 3   carvedilol (COREG) 12.5 MG tablet take 1 tab by mouth twice daily 180 tablet 3   Doxercalciferol (HECTOROL IV) Doxercalciferol (Hectorol)     ethyl chloride spray APPLY A SMALL AMOUNT OF SPRAY TO DIALYSIS ACCESS JUST BEFORE NEEDLE STICK THREE TIMES A WEEK AS DIRECTED (Patient taking differently: Apply 1 application. topically daily as needed (port access).) 348 mL 3   ethyl chloride spray Spray 1 as directed to skin three times a week as directed apply a small amount of spray to dialysis access just before needle stick 116 mL 3   ferric citrate (AURYXIA) 1 GM 210 MG(Fe) tablet Take 420-630 mg by mouth See admin instructions. Take 420 - 630 mg with each meal or snack     heparin 1000 unit/mL SOLN injection Heparin Sodium (Porcine) 1,000 Units/mL Catheter Lock Arterial     LOPERAMIDE HCL PO Take by mouth.     nystatin-triamcinolone ointment (MYCOLOG) Apply 1 application topically 2 (two) times daily. 30 g  0   Omega-3 Fatty Acids (OMEGA 3 500) 500 MG CAPS Take 500 mg by mouth every other day.     omeprazole (PRILOSEC) 20 MG capsule Take 1 capsule (20 mg total) by mouth daily. 1 PO 30 mins prior to breakfast and supper (Patient taking differently: Take 20 mg by mouth daily as needed (heartburn).) 30 capsule 3   oxyCODONE-acetaminophen (PERCOCET) 5-325 MG tablet Take 1 tablet by mouth every 4 (four) hours as needed  for severe pain. 20 tablet 0   Vitamin D, Ergocalciferol, (DRISDOL) 1.25 MG (50000 UNIT) CAPS capsule Take 1 capsule (50,000 Units total) by mouth every 7 (seven) days. 12 capsule 1   zolpidem (AMBIEN) 10 MG tablet Take 1 tablet (10 mg total) by mouth at bedtime as needed for sleep. 30 tablet 1   albuterol (VENTOLIN HFA) 108 (90 Base) MCG/ACT inhaler INHALE 2 PUFF USING INHALER EVERY FOUR TO SIX HOURS WHILE AWAKE AS NEEDED FOR WHEEZING, COUGH, OR SHORTNESS OF BREATH 18 g 1   B Complex-C-Folic Acid (DIALYVITE 829) 0.8 MG TABS Take 1 tablet by mouth daily in the afternoon. (Patient not taking: Reported on 03/06/2022)     carvedilol (COREG) 6.25 MG tablet TAKE 1 TABLET BY MOUTH ONCE A DAY AS DIRECTED FOR HIGH BLOOD PRESSURE (Patient taking differently: Take 6.25 mg by mouth in the morning. Take additional 6.25 mg in the evening if blood pressure is high) 90 tablet 3   carvedilol (COREG) 6.25 MG tablet Take 1 tablet (6.25 mg total) by mouth 2 (two) times daily. (Patient not taking: Reported on 03/06/2022) 60 tablet 12   No current facility-administered medications on file prior to visit.    Allergies  Allergen Reactions   Covid-19 (Mrna) Vaccine Swelling    Tongue swelling   Morphine And Related Nausea And Vomiting   Remeron [Mirtazapine] Other (See Comments)    Chest pain   Zoloft [Sertraline]     CP and chills   Amitriptyline Other (See Comments)    Makes her shake   Trazodone And Nefazodone Other (See Comments)    Makes her shake    Social History   Socioeconomic History   Marital status: Single    Spouse name: Not on file   Number of children: 2   Years of education: Not on file   Highest education level: Not on file  Occupational History   Not on file  Tobacco Use   Smoking status: Never   Smokeless tobacco: Never  Vaping Use   Vaping Use: Never used  Substance and Sexual Activity   Alcohol use: No   Drug use: No   Sexual activity: Not Currently    Birth  control/protection: Surgical  Other Topics Concern   Not on file  Social History Narrative   Not on file   Social Determinants of Health   Financial Resource Strain: Not on file  Food Insecurity: Not on file  Transportation Needs: Not on file  Physical Activity: Not on file  Stress: Not on file  Social Connections: Not on file  Intimate Partner Violence: Not on file    Family History  Problem Relation Age of Onset   Hypertension Mother    Hypertension Father    Hypertension Sister    Breast cancer Sister 37   Diabetes Brother    Hypertension Brother     Past Surgical History:  Procedure Laterality Date   AV FISTULA PLACEMENT Left 07/05/09   Dr. Kellie Simmering   AV FISTULA PLACEMENT Right 09/26/2021  Procedure: RIGHT ARM ARTERIOVENOUS (AV) FISTULA CREATION;  Surgeon: Waynetta Sandy, MD;  Location: [facility];  Service: Vascular;  Laterality: Right;   COLONOSCOPY N/A 02/10/2016   SLF: 1. HEME postive stool due to colon polyps intrernal hemorrhoids 2. small internal hemrrohoids 3. moderate external hemorrhoids.    ESOPHAGEAL DILATION N/A 02/10/2016   Procedure: ESOPHAGEAL DILATION;  Surgeon: Danie Binder, MD;  [facility];  Service: Endoscopy;  Laterality: N/A;   ESOPHAGOGASTRODUODENOSCOPY N/A 02/10/2016   SLF: patent Schatzki's ring , mild gastritis/ duodentitis.    FISTULA SUPERFICIALIZATION Left 67/05/7208   Procedure: PLICATION BRACHIOCEPHALIC FISTULA;  Surgeon: Angelia Mould, MD;  Location: [facility];  Service: Vascular;  Laterality: Left;   FISTULOGRAM N/A 10/22/2014   Procedure: FISTULOGRAM;  Surgeon: Angelia Mould, MD;  Location: [facility] CATH LAB;  Service: Cardiovascular;  Laterality: N/A;   INSERTION OF DIALYSIS CATHETER Right 08/27/2021   Procedure: INSERTION OF 19cm TUNNELED DIALYSIS CATHETER INTO RIGHT NECK;  Surgeon: Marty Heck, MD;  Location: [facility];  Service: Vascular;  Laterality: Right;   IR DIALY SHUNT INTRO NEEDLE/INTRACATH INITIAL  W/IMG LEFT Left 02/01/2019   IR DIALY SHUNT INTRO NEEDLE/INTRACATH INITIAL W/IMG LEFT Left 06/05/2020   IR DIALY SHUNT INTRO NEEDLE/INTRACATH INITIAL W/IMG LEFT Left 05/09/2021   IR THROMBECTOMY AV FISTULA W/THROMBOLYSIS/PTA INC/SHUNT/IMG LEFT Left 08/18/2021   IR US GUIDE VASC ACCESS LEFT  08/18/2021   PERIPHERAL VASCULAR BALLOON ANGIOPLASTY Left 07/14/2021   Procedure: PERIPHERAL VASCULAR BALLOON ANGIOPLASTY;  Surgeon: Waynetta Sandy, MD;  Location: [facility] CV LAB;  Service: Cardiovascular;  Laterality: Left;   PERIPHERAL VASCULAR CATHETERIZATION Left 12/09/2016   Procedure: A/V Fistulagram;  Surgeon: Waynetta Sandy, MD;  Location: [facility];  Service: Cardiovascular;  Laterality: Left;   REVISON OF ARTERIOVENOUS FISTULA Left 47/08/6282   Procedure: PLICATION OF BRACHIOCEPHALIC FISTULA;  Surgeon: Conrad [place], MD;  Location: [facility];  Service: Vascular;  Laterality: Left;   REVISON OF ARTERIOVENOUS FISTULA Left 08/09/2017   Procedure: REVISION OF ARTERIOVENOUS FISTULA LIGATION OF SIDE BRANCH;  Surgeon: Waynetta Sandy, MD;  Location: [facility];  Service: Vascular;  Laterality: Left;   TUBAL LIGATION  2004    ROS: Review of Systems Negative except as stated above  PHYSICAL EXAM: BP 119/73    Pulse 61    Resp 16    Ht '4\' 11"'$  (1.499 m)    Wt 121 lb (54.9 kg)    LMP 02/11/2012 Comment: tubal ligation   SpO2 99%    BMI 24.44 kg/m   Physical Exam  General appearance - alert, well appearing, and in no distress Mental status - normal mood, behavior, speech, dress, motor activity, and thought processes Chest - clear to auscultation, no wheezes, rales or rhonchi, symmetric air entry Heart - normal rate, regular rhythm, normal S1, S2, no murmurs, rubs, clicks or gallops Extremities -she has serpiginous grafts in both upper extremities.  EKG: Sinus bradycardia with rate of 59.  Moderate LVH.  CMP Latest Ref Rng & Units 11/11/2021 09/26/2021 08/27/2021  Glucose 70 -  99 mg/dL 75 84 81  BUN 6 - 20 mg/dL 20 29(H) 23(H)  Creatinine 0.44 - 1.00 mg/dL 4.07(H) 5.70(H) 7.20(H)  Sodium 135 - 145 mmol/L 134(L) 137 137  Potassium 3.5 - 5.1 mmol/L 3.0(L) 3.4(L) 3.6  Chloride 98 - 111 mmol/L 91(L) 96(L) 97(L)  CO2 22 - 32 mmol/L 25 - -  Calcium 8.9 - 10.3 mg/dL 9.7 - -  Total Protein  6.5 - 8.1 g/dL - - -  Total Bilirubin 0.3 - 1.2 mg/dL - - -  Alkaline Phos 38 - 126 U/L - - -  AST 15 - 41 U/L - - -  ALT 0 - 44 U/L - - -   Lipid Panel     Component Value Date/Time   CHOL 195 03/12/2021 0954   TRIG 109 03/12/2021 0954   HDL 65 03/12/2021 0954   CHOLHDL 3.0 03/12/2021 0954   CHOLHDL 2.8 Ratio 10/22/2010 2104   VLDL 30 10/22/2010 2104   LDLCALC 111 (H) 03/12/2021 0954    CBC    Component Value Date/Time   WBC 7.5 11/11/2021 1648   RBC 4.31 11/11/2021 1648   HGB 13.0 11/11/2021 1648   HGB 10.1 (L) 03/12/2021 0954   HCT 37.9 11/11/2021 1648   HCT 30.6 (L) 03/12/2021 0954   PLT 174 11/11/2021 1648   PLT 232 03/12/2021 0954   MCV 87.9 11/11/2021 1648   MCV 96 03/12/2021 0954   MCH 30.2 11/11/2021 1648   MCHC 34.3 11/11/2021 1648   RDW 13.7 11/11/2021 1648   RDW 14.3 03/12/2021 0954   LYMPHSABS 1.1 08/17/2021 1243   LYMPHSABS 1.1 03/12/2021 0954   MONOABS 0.3 08/17/2021 1243   EOSABS 0.0 08/17/2021 1243   EOSABS 0.1 03/12/2021 0954   BASOSABS 0.0 08/17/2021 1243   BASOSABS 0.0 03/12/2021 0954    ASSESSMENT AND PLAN: 1. Essential hypertension At goal.  Continue amlodipine and carvedilol.  2. Palpitations We will check chemistry and thyroid level.  If thyroid level is okay, we will refer to cardiology. - TSH+T4F+T3Free - EKG 68-EHOZ - Basic Metabolic Panel  3. ESRD on hemodialysis [facility]) Patient to continue going to dialysis.  4. Insomnia, unspecified type Patient tolerating Ambien without any significant adverse effects.  She will continue the Ambien as needed.   [employer] Language interpreter used during this encounter. # Allena Katz  [phone]  Patient was given the opportunity to ask questions.  Patient verbalized understanding of the plan and was able to repeat key elements of the plan.   This documentation was completed using Radio producer.  Any transcriptional errors are unintentional.  No orders of the defined types were placed in this encounter.    Requested Prescriptions    No prescriptions requested or ordered in this encounter    No follow-ups on file.  Karle Plumber, MD, FACP

## 2022-03-09 ENCOUNTER — Other Ambulatory Visit: Payer: Self-pay

## 2022-03-09 ENCOUNTER — Ambulatory Visit: Payer: No Typology Code available for payment source | Attending: Internal Medicine

## 2022-03-10 ENCOUNTER — Other Ambulatory Visit: Payer: Self-pay | Admitting: Internal Medicine

## 2022-03-10 DIAGNOSIS — R002 Palpitations: Secondary | ICD-10-CM

## 2022-03-10 LAB — BASIC METABOLIC PANEL
BUN/Creatinine Ratio: 7 — ABNORMAL LOW (ref 9–23)
BUN: 59 mg/dL — ABNORMAL HIGH (ref 6–24)
CO2: 23 mmol/L (ref 20–29)
Calcium: 9.8 mg/dL (ref 8.7–10.2)
Chloride: 96 mmol/L (ref 96–106)
Creatinine, Ser: 8.2 mg/dL — ABNORMAL HIGH (ref 0.57–1.00)
Glucose: 84 mg/dL (ref 70–99)
Potassium: 4.3 mmol/L (ref 3.5–5.2)
Sodium: 137 mmol/L (ref 134–144)
eGFR: 5 mL/min/{1.73_m2} — ABNORMAL LOW (ref >59)

## 2022-03-10 LAB — TSH+T4F+T3FREE
Free T4: 1.04 ng/dL (ref 0.82–1.77)
T3, Free: 3.2 pg/mL (ref 2.0–4.4)
TSH: 2.34 u[IU]/mL (ref 0.450–4.500)

## 2022-03-10 NOTE — Progress Notes (Signed)
Let patient know that her kidney function is abnormal.  Potassium and calcium levels are normal.  Thyroid level is normal.  I will refer her to cardiology for the palpitations.

## 2022-03-12 ENCOUNTER — Other Ambulatory Visit: Payer: Self-pay

## 2022-03-20 ENCOUNTER — Telehealth: Payer: Self-pay | Admitting: Internal Medicine

## 2022-03-20 NOTE — Telephone Encounter (Signed)
Copied from [place] [phone]. Topic: General - Other >> Mar 20, 2022  1:28 PM McGill, Nelva Bush wrote: Reason for CRM: Pt calling back requesting to speak with a nurse and discuss recent lab results.  Needs Spanish Interpreter.   Pt is requesting a call back today.   Please advise.

## 2022-03-25 NOTE — Telephone Encounter (Signed)
LVm ?[employer] interperter: rana ?Id: [id number] ? ?Returned pt call pt didn't answer lvm. Routed lab results to [facility] to give to pt if she calls back  ?

## 2022-03-27 ENCOUNTER — Ambulatory Visit: Payer: No Typology Code available for payment source | Admitting: Nurse Practitioner

## 2022-04-08 ENCOUNTER — Other Ambulatory Visit: Payer: Self-pay

## 2022-04-08 MED ORDER — ETHYL CHLORIDE EX AERO
INHALATION_SPRAY | CUTANEOUS | 11 refills | Status: DC
  Filled 2022-04-08: qty 116, 30d supply, fill #0

## 2022-04-28 ENCOUNTER — Other Ambulatory Visit: Payer: Self-pay

## 2022-04-28 ENCOUNTER — Telehealth: Payer: Self-pay | Admitting: Internal Medicine

## 2022-04-28 DIAGNOSIS — F5104 Psychophysiologic insomnia: Secondary | ICD-10-CM

## 2022-04-28 MED ORDER — ZOLPIDEM TARTRATE 10 MG PO TABS: 10.0000 mg | ORAL_TABLET | Freq: Every evening | ORAL | 1 refills | Status: DC | PRN

## 2022-04-28 MED ORDER — AMLODIPINE BESYLATE 5 MG PO TABS
ORAL_TABLET | ORAL | 3 refills | Status: DC
  Filled 2022-04-28: qty 180, 90d supply, fill #0
  Filled 2022-08-03: qty 180, 90d supply, fill #1
  Filled 2022-10-26: qty 60, 30d supply, fill #2
  Filled 2022-11-27: qty 60, 30d supply, fill #3
  Filled 2022-12-25: qty 60, 30d supply, fill #4
  Filled 2023-01-22: qty 60, 30d supply, fill #5
  Filled 2023-02-15: qty 60, 30d supply, fill #6
  Filled 2023-03-19: qty 60, 30d supply, fill #7

## 2022-04-28 NOTE — Telephone Encounter (Signed)
Pt is looking to get a refill  for her Zolpidem 10 mg. She stated she is out with no refills. ?

## 2022-04-30 ENCOUNTER — Other Ambulatory Visit: Payer: Self-pay

## 2022-05-01 ENCOUNTER — Other Ambulatory Visit: Payer: Self-pay | Admitting: Internal Medicine

## 2022-05-01 ENCOUNTER — Other Ambulatory Visit: Payer: Self-pay

## 2022-05-01 DIAGNOSIS — F5104 Psychophysiologic insomnia: Secondary | ICD-10-CM

## 2022-05-01 MED ORDER — ZOLPIDEM TARTRATE 10 MG PO TABS: 10.0000 mg | ORAL_TABLET | Freq: Every evening | ORAL | 1 refills | Status: DC | PRN

## 2022-05-01 NOTE — Telephone Encounter (Signed)
Interpreter assistance provided by [employer], [phone] ? ?Provider will send electronically.  ?Patient advised to check with pharmacy in couple of hours.  ?

## 2022-05-04 ENCOUNTER — Other Ambulatory Visit: Payer: Self-pay

## 2022-05-27 ENCOUNTER — Other Ambulatory Visit: Payer: Self-pay

## 2022-06-08 ENCOUNTER — Other Ambulatory Visit: Payer: Self-pay

## 2022-06-08 ENCOUNTER — Telehealth: Payer: Self-pay

## 2022-06-08 DIAGNOSIS — T829XXA Unspecified complication of cardiac and vascular prosthetic device, implant and graft, initial encounter: Secondary | ICD-10-CM

## 2022-06-08 NOTE — Telephone Encounter (Signed)
Received a referral from [facility]/Dr.Patel for a fistulogram needed due to severe pain with cannulation of AVF.   Spoke with patient via [employer] language interpreter, Felix Ahmadi ID# [id number]. Patient made aware Dr. Donzetta Matters was not available and scheduled with Dr. Scot Dock for a right arm fistulogram on June 16. Instructions reviewed and verbalized understanding.

## 2022-06-12 ENCOUNTER — Encounter (HOSPITAL_COMMUNITY)
Admission: RE | Disposition: A | Discharge status: Home / Self Care | Payer: Self-pay | Source: Home / Self Care | Attending: Vascular Surgery

## 2022-06-12 ENCOUNTER — Other Ambulatory Visit: Payer: Self-pay

## 2022-06-12 ENCOUNTER — Ambulatory Visit (HOSPITAL_COMMUNITY)
Admission: RE | Admit: 2022-06-12 | Discharge: 2022-06-12 | Disposition: A | Discharge status: Home / Self Care | Payer: No Typology Code available for payment source | Attending: Vascular Surgery | Admitting: Vascular Surgery

## 2022-06-12 DIAGNOSIS — I12 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 disease: Secondary | ICD-10-CM | POA: Insufficient documentation

## 2022-06-12 DIAGNOSIS — Y841 Kidney dialysis as the cause of abnormal reaction of the patient, or of later complication, without mention of misadventure at the time of the procedure: Secondary | ICD-10-CM | POA: Insufficient documentation

## 2022-06-12 DIAGNOSIS — N186 End stage renal disease: Secondary | ICD-10-CM | POA: Insufficient documentation

## 2022-06-12 DIAGNOSIS — Z992 Dependence on renal dialysis: Secondary | ICD-10-CM | POA: Insufficient documentation

## 2022-06-12 DIAGNOSIS — T829XXA Unspecified complication of cardiac and vascular prosthetic device, implant and graft, initial encounter: Secondary | ICD-10-CM

## 2022-06-12 DIAGNOSIS — T82898A Other specified complication of vascular prosthetic devices, implants and grafts, initial encounter: Secondary | ICD-10-CM | POA: Insufficient documentation

## 2022-06-12 HISTORY — PX: A/V FISTULAGRAM: CATH118298

## 2022-06-12 LAB — POCT I-STAT, CHEM 8
BUN: 28 mg/dL — ABNORMAL HIGH (ref 6–20)
Calcium, Ion: 1.21 mmol/L (ref 1.15–1.40)
Chloride: 96 mmol/L — ABNORMAL LOW (ref 98–111)
Creatinine, Ser: 6.5 mg/dL — ABNORMAL HIGH (ref 0.44–1.00)
Glucose, Bld: 80 mg/dL (ref 70–99)
HCT: 36 % (ref 36.0–46.0)
Hemoglobin: 12.2 g/dL (ref 12.0–15.0)
Potassium: 4 mmol/L (ref 3.5–5.1)
Sodium: 137 mmol/L (ref 135–145)
TCO2: 29 mmol/L (ref 22–32)

## 2022-06-12 SURGERY — A/V FISTULAGRAM
Anesthesia: LOCAL | Laterality: Right

## 2022-06-12 MED ORDER — HEPARIN (PORCINE) IN NACL 1000-0.9 UT/500ML-% IV SOLN
INTRAVENOUS | Status: DC | PRN
  Administered 2022-06-12: 500 mL

## 2022-06-12 MED ORDER — HEPARIN (PORCINE) IN NACL 1000-0.9 UT/500ML-% IV SOLN
INTRAVENOUS | Status: AC
  Filled 2022-06-12: qty 500

## 2022-06-12 MED ORDER — SODIUM CHLORIDE 0.9% FLUSH: 3.0000 mL | Freq: Two times a day (BID) | INTRAVENOUS | Status: DC

## 2022-06-12 MED ORDER — MIDAZOLAM HCL 2 MG/2ML IJ SOLN
INTRAMUSCULAR | Status: AC
  Filled 2022-06-12: qty 2

## 2022-06-12 MED ORDER — LIDOCAINE HCL (PF) 1 % IJ SOLN
INTRAMUSCULAR | Status: AC
  Filled 2022-06-12: qty 30

## 2022-06-12 MED ORDER — LIDOCAINE HCL (PF) 1 % IJ SOLN
INTRAMUSCULAR | Status: DC | PRN
  Administered 2022-06-12: 2 mL

## 2022-06-12 MED ORDER — SODIUM CHLORIDE 0.9 % IV SOLN: 250.0000 mL | INTRAVENOUS | Status: DC | PRN

## 2022-06-12 MED ORDER — FENTANYL CITRATE (PF) 100 MCG/2ML IJ SOLN
INTRAMUSCULAR | Status: DC | PRN
Start: 2022-06-12 — End: 2022-06-12
  Administered 2022-06-12: 50 ug via INTRAVENOUS

## 2022-06-12 MED ORDER — FENTANYL CITRATE (PF) 100 MCG/2ML IJ SOLN
INTRAMUSCULAR | Status: AC
  Filled 2022-06-12: qty 2

## 2022-06-12 MED ORDER — IODIXANOL 320 MG/ML IV SOLN
INTRAVENOUS | Status: DC | PRN
  Administered 2022-06-12: 25 mL

## 2022-06-12 MED ORDER — MIDAZOLAM HCL 2 MG/2ML IJ SOLN
INTRAMUSCULAR | Status: DC | PRN
  Administered 2022-06-12: 1 mg via INTRAVENOUS

## 2022-06-12 MED ORDER — SODIUM CHLORIDE 0.9% FLUSH: 3.0000 mL | INTRAVENOUS | Status: DC | PRN

## 2022-06-12 SURGICAL SUPPLY — 9 items

## 2022-06-12 NOTE — H&P (Signed)
ASSESSMENT & PLAN   END-STAGE RENAL DISEASE: This patient has pain in her fistula during dialysis and was set up for a fistulogram.  She has a catheter in her right IJ and dialyzes on Tuesdays Thursdays and Saturdays.  Through the translator I have discussed the indications for the procedure and the potential complications and she is agreeable to proceed.  REASON FOR CONSULT:    Poorly functioning right upper arm fistula  HPI:   Sabrina Mitchell is a 61 y.o. female who had a right brachiocephalic fistula placed on 09/26/2021.  She is reportedly been having problems with pain in the fistula and it is too painful to use.  She was set up for a fistulogram.  Past Medical History:  Diagnosis Date   Anemia    Anxiety    Complication of anesthesia    Depression    denies   End stage renal disease ([place])    hemodialysis 3x/wk @ [place]   Family history of adverse reaction to anesthesia    sister vomiting after uterine cyst removal   Gastritis    GERD (gastroesophageal reflux disease)    Hemorrhoids    Hypertension    Pneumonia    years ago   PONV (postoperative nausea and vomiting)    UTI (lower urinary tract infection) March 2014    Family History  Problem Relation Age of Onset   Hypertension Mother    Hypertension Father    Hypertension Sister    Breast cancer Sister 41   Diabetes Brother    Hypertension Brother     SOCIAL HISTORY: Social History   Tobacco Use   Smoking status: Never   Smokeless tobacco: Never  Substance Use Topics   Alcohol use: No    Allergies  Allergen Reactions   Covid-19 (Mrna) Vaccine Swelling    Tongue swelling   Morphine And Related Nausea And Vomiting   Remeron [Mirtazapine] Other (See Comments)    Chest pain   Zoloft [Sertraline] Other (See Comments)    Chest pain and chills   Elavil [Amitriptyline] Other (See Comments)    Makes her shake   Trazodone And Nefazodone Other (See Comments)    Makes her shake     Current Facility-Administered Medications  Medication Dose Route Frequency Provider Last Rate Last Admin   0.9 %  sodium chloride infusion  250 mL Intravenous PRN Angelia Mould, MD       sodium chloride flush (NS) 0.9 % injection 3 mL  3 mL Intravenous Q12H Angelia Mould, MD       sodium chloride flush (NS) 0.9 % injection 3 mL  3 mL Intravenous PRN Angelia Mould, MD        REVIEW OF SYSTEMS:  '[X]'$  denotes positive finding, '[ ]'$  denotes negative finding Cardiac  Comments:  Chest pain or chest pressure:    Shortness of breath upon exertion:    Short of breath when lying flat:    Irregular heart rhythm:        Vascular    Pain in calf, thigh, or hip brought on by ambulation:    Pain in feet at night that wakes you up from your sleep:     Blood clot in your veins:    Leg swelling:         Pulmonary    Oxygen at home:    Productive cough:     Wheezing:         Neurologic  Sudden weakness in arms or legs:     Sudden numbness in arms or legs:     Sudden onset of difficulty speaking or slurred speech:    Temporary loss of vision in one eye:     Problems with dizziness:         Gastrointestinal    Blood in stool:     Vomited blood:         Genitourinary    Burning when urinating:     Blood in urine:        Psychiatric    Major depression:         Hematologic    Bleeding problems:    Problems with blood clotting too easily:        Skin    Rashes or ulcers:        Constitutional    Fever or chills:    -  PHYSICAL EXAM:   Vitals:   06/12/22 1054  BP: 121/70  Pulse: 68  Resp: 18  Temp: 98.1 F (36.7 C)  TempSrc: Oral  SpO2: 100%  Weight: 52 kg  Height: '4\' 11"'$  (1.499 m)   Body mass index is 23.15 kg/m. GENERAL: The patient is a well-nourished female, in no acute distress. The vital signs are documented above. CARDIAC: There is a regular rate and rhythm.  VASCULAR: Palpable thrill in the right upper arm fistula PULMONARY:  There is good air exchange bilaterally without wheezing MUSCULOSKELETAL: There are no major deformities. NEUROLOGIC: No focal weakness or paresthesias are detected. SKIN: There are no ulcers or rashes noted. PSYCHIATRIC: The patient has a normal affect.  DATA:    LABS: Potassium is 4.0.  Deitra Mayo [facility]

## 2022-06-12 NOTE — Op Note (Signed)
   PATIENT: Sabrina Mitchell      MRN: [id number] DOB: 1961/01/22    DATE OF PROCEDURE: 06/12/2022  INDICATIONS:    [name] is a 61 y.o. female who was put on the schedule for a fistulogram as she is having problems with pain during dialysis.  The history is obtained through the translator.  She says that she has had problems with her fistula and a catheter was placed 7 months ago.  They try to use the fistula and when it hurts too bad they use the catheter.  For this reason she was scheduled for a fistulogram  PROCEDURE:    Ultrasound-guided access to right brachiocephalic AV fistula Fistulogram right brachiocephalic AV fistula  SURGEON: Judeth Cornfield. Scot Dock, MD, FACS  ANESTHESIA: Local with sedation  EBL: Minimal  TECHNIQUE: The patient was brought to the [place] lab and was sedated.  She received 1 mg of Versed and 50 mcg of fentanyl.  Her heart rate and blood pressure were monitored throughout the procedure and I was available for the entire procedure in the room with her.  The right arm was prepped and draped in usual sterile fashion.  Under ultrasound guidance, after the skin was anesthetized, I cannulated the proximal fistula with a micropuncture needle and a micropuncture sheath was introduced over a wire.  An real-time image of the access with Doppler with duplex was sent to the server.  A fistulogram was then obtained to evaluate the fistula to include the central veins.  In addition the fistula was compressed to allow reflux and evaluate the arterial anastomosis.  At the completion of 4-0 Monocryl was placed at the cannulation site and there was good hemostasis.  FINDINGS:   Her right brachiocephalic fistula is widely patent.  There is no areas of stenosis.  The only potential source of narrowing is where the right IJ catheter may impinge on the outflow vein.  If the fistula works well then the catheter can be removed.  If she still had evidence of venous  hypertension then this could potentially be addressed with venoplasty.  However this was not a good option with the catheter in place.  The arterial anastomosis is widely patent.  There is no narrowing within the fistula.  CLINICAL NOTE: No problems with the fistula are identified.  I have recommended that they use the fistula and if it is working adequately then I think the catheter can be removed and this is the only potential correctable potential source of problems with her fistula that may be causing her pain.  Deitra Mayo, MD, FACS [facility]  DATE OF DICTATION:   06/12/2022

## 2022-06-15 ENCOUNTER — Encounter (HOSPITAL_COMMUNITY): Payer: Self-pay | Admitting: Vascular Surgery

## 2022-06-16 ENCOUNTER — Other Ambulatory Visit: Payer: Self-pay

## 2022-06-16 MED ORDER — ETHYL CHLORIDE EX AERO
INHALATION_SPRAY | CUTANEOUS | 3 refills | Status: DC
  Filled 2022-06-16: qty 116, 30d supply, fill #0
  Filled 2023-02-08: qty 116, 30d supply, fill #1
  Filled 2023-03-24: qty 116, 30d supply, fill #2
  Filled 2023-04-26: qty 116, 30d supply, fill #3

## 2022-06-17 ENCOUNTER — Other Ambulatory Visit: Payer: Self-pay

## 2022-06-18 ENCOUNTER — Telehealth: Payer: Self-pay

## 2022-06-18 ENCOUNTER — Other Ambulatory Visit: Payer: Self-pay

## 2022-06-26 ENCOUNTER — Inpatient Hospital Stay (HOSPITAL_COMMUNITY): Admission: RE | Admit: 2022-06-26 | Payer: No Typology Code available for payment source | Source: Ambulatory Visit

## 2022-06-29 ENCOUNTER — Other Ambulatory Visit: Payer: Self-pay

## 2022-07-21 NOTE — Telephone Encounter (Signed)
error 

## 2022-07-22 ENCOUNTER — Other Ambulatory Visit: Payer: Self-pay | Admitting: Internal Medicine

## 2022-07-22 DIAGNOSIS — F5104 Psychophysiologic insomnia: Secondary | ICD-10-CM

## 2022-07-22 MED ORDER — ZOLPIDEM TARTRATE 10 MG PO TABS: 10.0000 mg | ORAL_TABLET | Freq: Every evening | ORAL | 1 refills | Status: DC | PRN

## 2022-07-23 ENCOUNTER — Ambulatory Visit: Payer: Self-pay | Attending: Internal Medicine | Admitting: Internal Medicine

## 2022-07-23 DIAGNOSIS — F5104 Psychophysiologic insomnia: Secondary | ICD-10-CM

## 2022-07-23 NOTE — Progress Notes (Signed)
Patient ID: Sabrina Mitchell, female   DOB: Jan 30, 1961, 61 y.o.   MRN: [id number] Virtual Visit via Telephone Note  I connected with Sabrina Mitchell on 07/23/2022 at 8:18 AM by telephone and verified that I am speaking with the correct person using two identifiers  Location: Patient: home Provider: office  Participants: Myself Patient Sabrina Mitchell interpreter: [phone], Marland Kitchen   I discussed the limitations, risks, security and privacy concerns of performing an evaluation and management service by telephone and the availability of in person appointments. I also discussed with the patient that there may be a patient responsible charge related to this service. The patient expressed understanding and agreed to proceed.   History of Present Illness: 61 year old with ESRD on HD, HTN, ACD, GERD, vit D def and insomnia. This is an UC appt Pt requesting RF on Ambien to be sent to [facility]. Reports no significant side effects from the medication.  Denies any increased daytime drowsiness.  She does not take it every night.  Some nights she takes only a half a pill.  She feels the medication also helps and calming her down if she feels anxious before bedtime.   Outpatient Encounter Medications as of 07/23/2022  Medication Sig Note   acetaminophen (TYLENOL) 500 MG tablet Take 500-1,000 mg by mouth every 6 (six) hours as needed for moderate pain or headache.    amLODipine (NORVASC) 5 MG tablet Take 1 tablet by mouth twice daily for high blood pressure    B Complex-C-Folic Acid (DIALYVITE 379) 0.8 MG TABS Take 1 tablet by mouth daily at 2 PM.    calcium carbonate (TUMS - DOSED IN MG ELEMENTAL CALCIUM) 500 MG chewable tablet Chew 1-2 tablets by mouth 3 (three) times daily as needed for indigestion or heartburn.    carvedilol (COREG) 12.5 MG tablet take 1 tab by mouth twice daily    Doxercalciferol (HECTOROL IV) Doxercalciferol (Hectorol)    ethyl chloride spray APPLY A SMALL AMOUNT OF  SPRAY TO DIALYSIS ACCESS JUST BEFORE NEEDLE STICK THREE TIMES A WEEK AS DIRECTED    ethyl chloride spray Spray 1 as directed to skin three times a week as directed apply a small amount of spray to dialysis access just before needle stick    ethyl chloride spray Apply topically to dialysis fistula immediately prior to cannulation    ethyl chloride spray Spray 1 as directed to skin three times a week as directed apply a small amount of spray to dialysis access just before needle stick    ferric citrate (AURYXIA) 1 GM 210 MG(Fe) tablet Take 420-630 mg by mouth See admin instructions. Take 2-3 tablets (420 - 630 mg) by mouth with each meal or snack 09/24/2021: Depends on the size of the meal / snack if she takes 2 or 3   heparin 1000 unit/mL SOLN injection Heparin Sodium (Porcine) 1,000 Units/mL Catheter Lock Arterial    nystatin-triamcinolone ointment (MYCOLOG) Apply 1 application topically 2 (two) times daily. (Patient taking differently: Apply 1 application  topically 2 (two) times daily as needed (irritation--itching).)    Omega-3 Fatty Acids (OMEGA 3 500) 500 MG CAPS Take 500 mg by mouth every other day. 06/08/2022: On hold for more than a month (patient needs to purchase more)   omeprazole (PRILOSEC) 20 MG capsule Take 1 capsule (20 mg total) by mouth daily. 1 PO 30 mins prior to breakfast and supper (Patient taking differently: Take 20 mg by mouth daily as needed (heartburn).)    zolpidem (AMBIEN) 10 MG  tablet Take 1 tablet (10 mg total) by mouth at bedtime as needed for sleep.    No facility-administered encounter medications on file as of 07/23/2022.      Observations/Objective: No direct observation done as this was a telephone visit.  Assessment and Plan: 1. Chronic insomnia Patient advised that I have sent refills on the Ambien yesterday.  I reviewed [place] controlled substance reporting system prior to sending that refill. She appears to be benefiting from the medication without  any significant side effects.   Follow Up Instructions: Keep her routine follow-up for chronic disease management.   I discussed the assessment and treatment plan with the patient. The patient was provided an opportunity to ask questions and all were answered. The patient agreed with the plan and demonstrated an understanding of the instructions.   The patient was advised to call back or seek an in-person evaluation if the symptoms worsen or if the condition fails to improve as anticipated.  I  Spent 7 minutes on this telephone encounter  This note has been created with [employer], quantity. Any transcriptional errors are unintentional.  Karle Plumber, MD

## 2022-07-31 ENCOUNTER — Other Ambulatory Visit: Payer: Self-pay

## 2022-07-31 ENCOUNTER — Other Ambulatory Visit: Payer: Self-pay | Admitting: Internal Medicine

## 2022-07-31 DIAGNOSIS — R21 Rash and other nonspecific skin eruption: Secondary | ICD-10-CM

## 2022-07-31 MED ORDER — NYSTATIN-TRIAMCINOLONE 100000-0.1 UNIT/GM-% EX OINT
1.0000 | TOPICAL_OINTMENT | Freq: Two times a day (BID) | CUTANEOUS | 0 refills | Status: DC
  Filled 2022-07-31: qty 30, 15d supply, fill #0

## 2022-07-31 NOTE — Telephone Encounter (Signed)
Requested medications are due for refill today.  yes  Requested medications are on the active medications list.  yes  Last refill. 10/06/2021 30g  Future visit scheduled.   yes  Notes to clinic.  Provider to review - no protocol.    Requested Prescriptions  Pending Prescriptions Disp Refills   nystatin-triamcinolone ointment (MYCOLOG) 30 g 0    Sig: Apply 1 application topically 2 (two) times daily.     Off-Protocol Failed - 07/31/2022  2:50 PM      Failed - Medication not assigned to a protocol, review manually.      Passed - Valid encounter within last 12 months    Recent Outpatient Visits           1 week ago Chronic insomnia   [employer], MD   4 months ago Essential hypertension   [place], MD   8 months ago Chronic insomnia   [place], MD   9 months ago Annual physical exam   [place] Ladell Pier, MD   1 year ago Need for zoster vaccine   [place], RPH-CPP       Future Appointments             In 1 week Lad[place], MD [place]

## 2022-08-03 ENCOUNTER — Other Ambulatory Visit: Payer: Self-pay

## 2022-08-07 ENCOUNTER — Encounter: Payer: Self-pay | Admitting: Internal Medicine

## 2022-08-07 ENCOUNTER — Ambulatory Visit: Payer: Self-pay | Attending: Internal Medicine | Admitting: Internal Medicine

## 2022-08-07 VITALS — BP 106/73 | HR 63 | Temp 98.1°F | Ht 59.0 in | Wt 114.4 lb

## 2022-08-07 DIAGNOSIS — I10 Essential (primary) hypertension: Secondary | ICD-10-CM

## 2022-08-07 DIAGNOSIS — Z1231 Encounter for screening mammogram for malignant neoplasm of breast: Secondary | ICD-10-CM

## 2022-08-07 DIAGNOSIS — N186 End stage renal disease: Secondary | ICD-10-CM

## 2022-08-07 DIAGNOSIS — Z992 Dependence on renal dialysis: Secondary | ICD-10-CM

## 2022-08-07 DIAGNOSIS — R1013 Epigastric pain: Secondary | ICD-10-CM

## 2022-08-07 DIAGNOSIS — G8929 Other chronic pain: Secondary | ICD-10-CM

## 2022-08-07 DIAGNOSIS — G4700 Insomnia, unspecified: Secondary | ICD-10-CM

## 2022-08-07 NOTE — Progress Notes (Signed)
Patient ID: Sabrina Mitchell, female    DOB: 1961/10/07  MRN: [id number]  CC: Hypertension   Subjective: Sabrina Mitchell is a 61 y.o. female who presents for chronic ds management Her concerns today include:  ESRD on HD, HTN, ACD, GERD, vit D def and insomnia.  ESRD/HTN:  consistent with going to HD 3x/wk Compliant with taking Norvasc '5mg'$  daily and Coreg 12.5 mg BID.   Limits salt in foods No CP/SOB/LE edema  C/o stomach pain intermittently Takes Omeprazole PRN.  Does not eat as much spicy foods any more so not having to take the Omeprazole as often.  No N/V with the pain.  However does report  N/V when she takes Cinacalcet 90 mg Some problems swallowing only when she takes Ferric Citrate.  Feels like the pill gets stuck in her throat Had EGD and c-scope 2017 that showed gastritis and hemorrhoids.  She is worried that something is wrong with her stomach and that she may need to be scoped again.  Chronic insomnia: Taking and tolerating Ambien without major side effects. Patient Active Problem List   Diagnosis Date Noted   Psychophysiological insomnia 02/13/2021   New onset of headaches 02/13/2021   Depression with anxiety 10/30/2020   Pulmonary edema 09/26/2019   Chest pain 09/26/2019   Elevated troponin 09/26/2019   Shortness of breath 05/16/2019   Calcaneal spur of right foot 10/10/2018   Ulnar neuropathy of left upper extremity 03/11/2018   GERD (gastroesophageal reflux disease) 01/17/2016   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[place]) 10/23/2015   Dependence on renal dialysis ([place]) 10/15/2015   Type 2 diabetes mellitus with diabetic peripheral angiopathy without gangrene ([place]) 04/24/2015   Pruritus, unspecified 12/26/2014   Coagulation defect, unspecified ([place]) 12/18/2014   Type 2 diabetes mellitus with other diabetic kidney complication ([place]) 40/07/6760   Diarrhea, unspecified 09/13/2014   Fever, unspecified 08/30/2014   Pain, unspecified  08/30/2014   Generalized abdominal pain 04/05/2013   Post-menopausal bleeding 04/06/2012   End stage renal disease on dialysis ([place]) 04/06/2012   Hypertension 04/06/2012   Anemia in chronic kidney disease 03/10/2012   Secondary hyperparathyroidism of renal origin ([place]) 03/10/2012   Iron deficiency anemia, unspecified 01/23/2012   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 disease ([place]) 03/25/2010     Current Outpatient Medications on File Prior to Visit  Medication Sig Dispense Refill   acetaminophen (TYLENOL) 500 MG tablet Take 500-1,000 mg by mouth every 6 (six) hours as needed for moderate pain or headache.     amLODipine (NORVASC) 5 MG tablet Take 1 tablet by mouth twice daily for high blood pressure 180 tablet 3   B Complex-C-Folic Acid (DIALYVITE 950) 0.8 MG TABS Take 1 tablet by mouth daily at 2 PM.     calcium carbonate (TUMS - DOSED IN MG ELEMENTAL CALCIUM) 500 MG chewable tablet Chew 1-2 tablets by mouth 3 (three) times daily as needed for indigestion or heartburn.     carvedilol (COREG) 12.5 MG tablet take 1 tab by mouth twice daily 180 tablet 3   Doxercalciferol (HECTOROL IV) Doxercalciferol (Hectorol)     ethyl chloride spray APPLY A SMALL AMOUNT OF SPRAY TO DIALYSIS ACCESS JUST BEFORE NEEDLE STICK THREE TIMES A WEEK AS DIRECTED 348 mL 3   ethyl chloride spray Spray 1 as directed to skin three times a week as directed apply a small amount of spray to dialysis access just before needle stick 116 mL 3  ethyl chloride spray Apply topically to dialysis fistula immediately prior to cannulation 116 mL 11   ethyl chloride spray Spray 1 as directed to skin three times a week as directed apply a small amount of spray to dialysis access just before needle stick 116 mL 3   ferric citrate (AURYXIA) 1 GM 210 MG(Fe) tablet Take 420-630 mg by mouth See admin instructions. Take 2-3 tablets (420 - 630 mg) by mouth with each meal or snack      heparin 1000 unit/mL SOLN injection Heparin Sodium (Porcine) 1,000 Units/mL Catheter Lock Arterial     nystatin-triamcinolone ointment (MYCOLOG) Apply 1 application topically 2 (two) times daily. 30 g 0   Omega-3 Fatty Acids (OMEGA 3 500) 500 MG CAPS Take 500 mg by mouth every other day.     omeprazole (PRILOSEC) 20 MG capsule Take 1 capsule (20 mg total) by mouth daily. 1 PO 30 mins prior to breakfast and supper (Patient taking differently: Take 20 mg by mouth daily as needed (heartburn).) 30 capsule 3   zolpidem (AMBIEN) 10 MG tablet Take 1 tablet (10 mg total) by mouth at bedtime as needed for sleep. 30 tablet 1   No current facility-administered medications on file prior to visit.    Allergies  Allergen Reactions   Covid-19 (Mrna) Vaccine Swelling    Tongue swelling   Morphine And Related Nausea And Vomiting   Remeron [Mirtazapine] Other (See Comments)    Chest pain   Zoloft [Sertraline] Other (See Comments)    Chest pain and chills   Elavil [Amitriptyline] Other (See Comments)    Makes her shake   Trazodone And Nefazodone Other (See Comments)    Makes her shake    Social History   Socioeconomic History   Marital status: Single    Spouse name: Not on file   Number of children: 2   Years of education: Not on file   Highest education level: Not on file  Occupational History   Not on file  Tobacco Use   Smoking status: Never   Smokeless tobacco: Never  Vaping Use   Vaping Use: Never used  Substance and Sexual Activity   Alcohol use: No   Drug use: No   Sexual activity: Not Currently    Birth control/protection: Surgical  Other Topics Concern   Not on file  Social History Narrative   Not on file   Social Determinants of Health   Financial Resource Strain: Unknown (09/27/2019)   Overall Financial Resource Strain (CARDIA)    Difficulty of Paying Living Expenses: Patient refused  Food Insecurity: Unknown (09/27/2019)   Hunger Vital Sign    Worried About Running Out  of Food in the Last Year: Patient refused    [place] in the Last Year: Patient refused  Transportation Needs: Unknown (09/27/2019)   PRAPARE - Transportation    Lack of Transportation (Medical): Patient refused    Lack of Transportation (Non-Medical): Patient refused  Physical Activity: Unknown (09/27/2019)   Exercise Vital Sign    Days of Exercise per Week: Patient refused    Minutes of Exercise per Session: Patient refused  Stress: Unknown (09/27/2019)   [place]    Feeling of Stress : Patient refused  Social Connections: Unknown (09/27/2019)   Social Connection and Isolation Panel [NHANES]    Frequency of Communication with Friends and Family: Patient refused    Frequency of Social Gatherings with Friends and Family: Patient refused  Attends Religious Services: Patient refused    Active Member of Clubs or Organizations: Patient refused    Attends Archivist Meetings: Patient refused    Marital Status: Patient refused  Intimate Partner Violence: Unknown (09/27/2019)   Humiliation, Afraid, Rape, and Kick questionnaire    Fear of Current or Ex-Partner: Patient refused    Emotionally Abused: Patient refused    Physically Abused: Patient refused    Sexually Abused: Patient refused    Family History  Problem Relation Age of Onset   Hypertension Mother    Hypertension Father    Hypertension Sister    Breast cancer Sister 25   Diabetes Brother    Hypertension Brother     Past Surgical History:  Procedure Laterality Date   A/V FISTULAGRAM Right 06/12/2022   Procedure: A/V Fistulagram;  Surgeon: Angelia Mould, MD;  Location: [place] [facility];  Service: Cardiovascular;  Laterality: Right;   AV FISTULA PLACEMENT Left 07/05/09   Dr. Kellie Simmering   AV FISTULA PLACEMENT Right 09/26/2021   Procedure: RIGHT ARM ARTERIOVENOUS (AV) FISTULA CREATION;  Surgeon: Sabrina Sandy, MD;  Location: [facility];  Service: Vascular;  Laterality: Right;   COLONOSCOPY N/A 02/10/2016   SLF: 1. HEME postive stool due to colon polyps intrernal hemorrhoids 2. small internal hemrrohoids 3. moderate external hemorrhoids.    ESOPHAGEAL DILATION N/A 02/10/2016   Procedure: ESOPHAGEAL DILATION;  Surgeon: Danie Binder, MD;  [facility];  Service: Endoscopy;  Laterality: N/A;   ESOPHAGOGASTRODUODENOSCOPY N/A 02/10/2016   SLF: patent Schatzki's ring , mild gastritis/ duodentitis.    FISTULA SUPERFICIALIZATION Left 61/08/5092   Procedure: PLICATION BRACHIOCEPHALIC FISTULA;  Surgeon: Angelia Mould, MD;  Location: [place];  Service: Vascular;  Laterality: Left;   FISTULOGRAM N/A 10/22/2014   Procedure: FISTULOGRAM;  Surgeon: Angelia Mould, MD;  Location: [facility];  Service: Cardiovascular;  Laterality: N/A;   INSERTION OF DIALYSIS CATHETER Right 08/27/2021   Procedure: INSERTION OF 19cm TUNNELED DIALYSIS CATHETER INTO RIGHT NECK;  Surgeon: Marty Heck, MD;  Location: [place];  Service: Vascular;  Laterality: Right;   IR DIALY SHUNT INTRO NEEDLE/INTRACATH INITIAL W/IMG LEFT Left 02/01/2019   IR DIALY SHUNT INTRO NEEDLE/INTRACATH INITIAL W/IMG LEFT Left 06/05/2020   IR DIALY SHUNT INTRO NEEDLE/INTRACATH INITIAL W/IMG LEFT Left 05/09/2021   IR THROMBECTOMY AV FISTULA W/THROMBOLYSIS/PTA INC/SHUNT/IMG LEFT Left 08/18/2021   IR US GUIDE VASC ACCESS LEFT  08/18/2021   PERIPHERAL VASCULAR BALLOON ANGIOPLASTY Left 07/14/2021   Procedure: PERIPHERAL VASCULAR BALLOON ANGIOPLASTY;  Surgeon: Sabrina Sandy, MD;  Location: [place] [facility];  Service: Cardiovascular;  Laterality: Left;   PERIPHERAL VASCULAR CATHETERIZATION Left 12/09/2016   Procedure: A/V Fistulagram;  Surgeon: Sabrina Sandy, MD;  Location: [place] [facility];  Service: Cardiovascular;  Laterality: Left;   REVISON OF ARTERIOVENOUS FISTULA Left 26/71/2458   Procedure: PLICATION OF BRACHIOCEPHALIC FISTULA;  Surgeon:  Conrad , MD;  Location: [place];  Service: Vascular;  Laterality: Left;   REVISON OF ARTERIOVENOUS FISTULA Left 08/09/2017   Procedure: REVISION OF ARTERIOVENOUS FISTULA LIGATION OF SIDE BRANCH;  Surgeon: Sabrina Sandy, MD;  Location: [place];  Service: Vascular;  Laterality: Left;   TUBAL LIGATION  2004    ROS: Review of Systems Negative except as stated above  PHYSICAL EXAM: BP 106/73   Pulse 63   Temp 98.1 F (36.7 C) (Oral)   Ht '4\' 11"'$  (1.499 m)   Wt 114 lb 6.4 oz (  51.9 kg)   LMP 02/11/2012 Comment: tubal ligation  SpO2 100%   BMI 23.11 kg/m   Wt Readings from Last 3 Encounters:  08/07/22 114 lb 6.4 oz (51.9 kg)  06/12/22 114 lb 10.2 oz (52 kg)  03/06/22 121 lb (54.9 kg)    Physical Exam  General appearance - alert, well appearing, middle-aged older Hispanic female and in no distress Mental status -patient very talkative and tangential.  Difficult to pin down to get yes or no answers from her Chest - clear to auscultation, no wheezes, rales or rhonchi, symmetric air entry Heart - normal rate, regular rhythm, normal S1, S2, no murmurs, rubs, clicks or gallops Abdomen -normal bowel sounds, soft, mild diffuse tenderness. Extremities -trace bilateral lower extremity edema.      Latest Ref Rng & Units 06/12/2022   11:06 AM 03/09/2022    9:35 AM 11/11/2021    4:48 PM  CMP  Glucose 70 - 99 mg/dL 80  84  75   BUN 6 - 20 mg/dL 28  59  20   Creatinine 0.44 - 1.00 mg/dL 6.50  8.20  4.07   Sodium 135 - 145 mmol/L 137  137  134   Potassium 3.5 - 5.1 mmol/L 4.0  4.3  3.0   Chloride 98 - 111 mmol/L 96  96  91   CO2 20 - 29 mmol/L  23  25   Calcium 8.7 - 10.2 mg/dL  9.8  9.7    Lipid Panel     Component Value Date/Time   CHOL 195 03/12/2021 0954   TRIG 109 03/12/2021 0954   HDL 65 03/12/2021 0954   CHOLHDL 3.0 03/12/2021 0954   CHOLHDL 2.8 Ratio 10/22/2010 2104   VLDL 30 10/22/2010 2104   LDLCALC 111 (H) 03/12/2021 0954    CBC    Component Value  Date/Time   WBC 7.5 11/11/2021 1648   RBC 4.31 11/11/2021 1648   HGB 12.2 06/12/2022 1106   HGB 10.1 (L) 03/12/2021 0954   HCT 36.0 06/12/2022 1106   HCT 30.6 (L) 03/12/2021 0954   PLT 174 11/11/2021 1648   PLT 232 03/12/2021 0954   MCV 87.9 11/11/2021 1648   MCV 96 03/12/2021 0954   MCH 30.2 11/11/2021 1648   MCHC 34.3 11/11/2021 1648   RDW 13.7 11/11/2021 1648   RDW 14.3 03/12/2021 0954   LYMPHSABS 1.1 08/17/2021 1243   LYMPHSABS 1.1 03/12/2021 0954   MONOABS 0.3 08/17/2021 1243   EOSABS 0.0 08/17/2021 1243   EOSABS 0.1 03/12/2021 0954   BASOSABS 0.0 08/17/2021 1243   BASOSABS 0.0 03/12/2021 0954    ASSESSMENT AND PLAN: 1. Essential hypertension At goal.  Continue carvedilol and amlodipine as prescribed.  2. Chronic epigastric pain Advised to take the omeprazole every day rather than as needed.  GERD precautions discussed. - Ambulatory referral to Gastroenterology  3. ESRD on hemodialysis [facility]) She will continue her current dialysis schedule.  4. Insomnia, unspecified type Taking and tolerating Ambien without major side effects.  Continue to stress good sleep hygiene.  5. Encounter for screening mammogram for malignant neoplasm of breast Due for mammogram.  Mammogram scholarship given. - MM Digital Screening; Future    [employer] Language interpreter used during this encounter. #[id number], Daniel  Patient was given the opportunity to ask questions.  Patient verbalized understanding of the plan and was able to repeat key elements of the plan.   This documentation was completed using Radio producer.  Any transcriptional errors are unintentional.  No orders of the defined types were placed in this encounter.    Requested Prescriptions    No prescriptions requested or ordered in this encounter    No follow-ups on file.  Karle Plumber, MD, FACP

## 2022-08-10 ENCOUNTER — Telehealth: Payer: Self-pay | Admitting: Emergency Medicine

## 2022-08-10 DIAGNOSIS — N644 Mastodynia: Secondary | ICD-10-CM

## 2022-08-10 DIAGNOSIS — Z1231 Encounter for screening mammogram for malignant neoplasm of breast: Secondary | ICD-10-CM

## 2022-08-10 NOTE — Telephone Encounter (Signed)
Copied from [place] [id number]. Topic: Referral - Status >> Aug 10, 2022  9:38 AM Cyndi Bender wrote: Reason for CRM: [facility] reports pt is having pain in her breast so they will need an order for diagnostic mammogram as well as an order for right breast ultrasound. Cb# [phone]

## 2022-08-12 NOTE — Telephone Encounter (Signed)
Please advise.----DD,RMA

## 2022-08-12 NOTE — Addendum Note (Signed)
Addended by: Asencion Noble E on: 08/12/2022 12:13 PM   Modules accepted: Orders

## 2022-08-12 NOTE — Telephone Encounter (Signed)
Order placed

## 2022-08-12 NOTE — Telephone Encounter (Signed)
Called and spoke w/ Rad tech w/ [facility]. They state order received and that pt will be contacted for appt.----DD,RMA

## 2022-08-20 ENCOUNTER — Telehealth: Payer: Self-pay

## 2022-08-20 NOTE — Telephone Encounter (Signed)
Faxed request received from [facility] for catheter removal.

## 2022-08-26 ENCOUNTER — Other Ambulatory Visit: Payer: Self-pay

## 2022-08-26 NOTE — Telephone Encounter (Signed)
Reached patient via language line interpreter [place] [phone]. Patient requested to postpone [facility] removal until 09/16/22. Advised patient to arrive at [facility] at 1245PM to admitting. Patient verbalized understanding.   Contacted Crystal at [facility] to inform of appointment information. She verbalized understanding.

## 2022-09-01 ENCOUNTER — Other Ambulatory Visit: Payer: Self-pay

## 2022-09-03 ENCOUNTER — Other Ambulatory Visit: Payer: Self-pay

## 2022-09-03 DIAGNOSIS — N6310 Unspecified lump in the right breast, unspecified quadrant: Secondary | ICD-10-CM

## 2022-09-03 MED ORDER — ETHYL CHLORIDE EX AERO
1.0000 | INHALATION_SPRAY | CUTANEOUS | 3 refills | Status: DC
Start: 2022-09-04 — End: 2023-04-23
  Filled 2022-09-03 – 2022-09-21 (×2): qty 116, 30d supply, fill #0
  Filled 2022-11-06 – 2022-11-13 (×2): qty 116, 30d supply, fill #1
  Filled 2022-12-25: qty 116, 30d supply, fill #2
  Filled 2023-01-13: qty 116, 30d supply, fill #3

## 2022-09-09 ENCOUNTER — Encounter (HOSPITAL_COMMUNITY): Payer: No Typology Code available for payment source

## 2022-09-09 ENCOUNTER — Other Ambulatory Visit: Payer: Self-pay

## 2022-09-10 ENCOUNTER — Other Ambulatory Visit: Payer: Self-pay | Admitting: *Deleted

## 2022-09-10 DIAGNOSIS — T829XXA Unspecified complication of cardiac and vascular prosthetic device, implant and graft, initial encounter: Secondary | ICD-10-CM

## 2022-09-14 NOTE — Progress Notes (Unsigned)
[facility] [place] PROGRESS NOTE  ASSESSMENT / PLAN: Sabrina Mitchell is a 61 y.o. female ***   SUBJECTIVE: ***  OBJECTIVE: LMP 02/11/2012 Comment: tubal ligation '@INTAKEOUTPUTBRIEF'$ @  Urine output over past 24 hours: ***  Constitutional: *** appearing. *** acute distress. CNS: *** Cardiac: ***. Pulmonary: *** Abdomen: *** Vascular: ***     Latest Ref Rng & Units 06/12/2022   11:06 AM 11/11/2021    4:48 PM 09/26/2021    8:27 AM  CBC  WBC 4.0 - 10.5 K/uL  7.5    Hemoglobin 12.0 - 15.0 g/dL 12.2  13.0  13.3   Hematocrit 36.0 - 46.0 % 36.0  37.9  39.0   Platelets 150 - 400 K/uL  174          Latest Ref Rng & Units 06/12/2022   11:06 AM 03/09/2022    9:35 AM 11/11/2021    4:48 PM  CMP  Glucose 70 - 99 mg/dL 80  84  75   BUN 6 - 20 mg/dL 28  59  20   Creatinine 0.44 - 1.00 mg/dL 6.50  8.20  4.07   Sodium 135 - 145 mmol/L 137  137  134   Potassium 3.5 - 5.1 mmol/L 4.0  4.3  3.0   Chloride 98 - 111 mmol/L 96  96  91   CO2 20 - 29 mmol/L  23  25   Calcium 8.7 - 10.2 mg/dL  9.8  9.7     CrCl cannot be calculated (Patient's most recent lab result is older than the maximum 21 days allowed.).  ***  Sabrina Mitchell. Stanford Breed, MD [facility] Phone Number: [phone] 09/14/2022 7:59 PM

## 2022-09-15 ENCOUNTER — Encounter: Payer: Self-pay | Admitting: Vascular Surgery

## 2022-09-15 ENCOUNTER — Telehealth: Payer: Self-pay

## 2022-09-15 ENCOUNTER — Ambulatory Visit (INDEPENDENT_AMBULATORY_CARE_PROVIDER_SITE_OTHER): Payer: Self-pay | Admitting: Vascular Surgery

## 2022-09-15 ENCOUNTER — Ambulatory Visit (HOSPITAL_COMMUNITY)
Admission: RE | Admit: 2022-09-15 | Discharge: 2022-09-15 | Disposition: A | Discharge status: Home / Self Care | Payer: Self-pay | Source: Ambulatory Visit | Attending: Vascular Surgery | Admitting: Vascular Surgery

## 2022-09-15 VITALS — BP 122/73 | HR 68 | Temp 98.4°F | Resp 20 | Ht 59.0 in | Wt 119.0 lb

## 2022-09-15 DIAGNOSIS — N186 End stage renal disease: Secondary | ICD-10-CM

## 2022-09-15 DIAGNOSIS — Z992 Dependence on renal dialysis: Secondary | ICD-10-CM

## 2022-09-15 DIAGNOSIS — T829XXA Unspecified complication of cardiac and vascular prosthetic device, implant and graft, initial encounter: Secondary | ICD-10-CM | POA: Insufficient documentation

## 2022-09-15 NOTE — Telephone Encounter (Signed)
Received a call from [place] at [facility] requesting to cancel appointment for [facility] removal on tomorrow due to continued need for catheter while Dr. Stanford Breed is trying to determine cause of arm pain.   Attempted to reach [facility] to cancel procedure. Left vm with instructions to cancel procedure and call back number. Also contacted [facility] Short stay and informed Marissa of this information. She verbalized understanding.

## 2022-09-16 ENCOUNTER — Encounter (HOSPITAL_COMMUNITY): Payer: No Typology Code available for payment source

## 2022-09-21 ENCOUNTER — Other Ambulatory Visit: Payer: Self-pay

## 2022-09-30 ENCOUNTER — Other Ambulatory Visit: Payer: Self-pay

## 2022-10-14 ENCOUNTER — Ambulatory Visit (HOSPITAL_BASED_OUTPATIENT_CLINIC_OR_DEPARTMENT_OTHER): Payer: Self-pay | Admitting: Orthopaedic Surgery

## 2022-10-15 ENCOUNTER — Ambulatory Visit: Payer: No Typology Code available for payment source

## 2022-10-15 ENCOUNTER — Other Ambulatory Visit: Payer: No Typology Code available for payment source

## 2022-10-19 ENCOUNTER — Other Ambulatory Visit: Payer: Self-pay | Admitting: Pharmacist

## 2022-10-19 DIAGNOSIS — F5104 Psychophysiologic insomnia: Secondary | ICD-10-CM

## 2022-10-19 MED ORDER — ZOLPIDEM TARTRATE 10 MG PO TABS
10.0000 mg | ORAL_TABLET | Freq: Every evening | ORAL | 1 refills | Status: DC | PRN
Start: 2022-10-19 — End: 2022-11-27

## 2022-10-19 NOTE — Telephone Encounter (Signed)
Patient called requesting zolpidem refills. Will route to PCP to approve. Preferred pharmacy is the [facility] we have on file.

## 2022-10-26 ENCOUNTER — Other Ambulatory Visit: Payer: Self-pay

## 2022-10-26 ENCOUNTER — Other Ambulatory Visit: Payer: Self-pay | Admitting: Internal Medicine

## 2022-10-26 DIAGNOSIS — R21 Rash and other nonspecific skin eruption: Secondary | ICD-10-CM

## 2022-10-27 ENCOUNTER — Ambulatory Visit: Payer: Self-pay | Admitting: Vascular Surgery

## 2022-10-27 ENCOUNTER — Other Ambulatory Visit: Payer: Self-pay

## 2022-10-27 MED ORDER — NYSTATIN-TRIAMCINOLONE 100000-0.1 UNIT/GM-% EX OINT
1.0000 | TOPICAL_OINTMENT | Freq: Two times a day (BID) | CUTANEOUS | 0 refills | Status: DC
  Filled 2022-10-27 – 2022-11-06 (×2): qty 30, 15d supply, fill #0

## 2022-11-03 ENCOUNTER — Other Ambulatory Visit: Payer: Self-pay

## 2022-11-06 ENCOUNTER — Other Ambulatory Visit: Payer: Self-pay

## 2022-11-12 ENCOUNTER — Other Ambulatory Visit: Payer: Self-pay

## 2022-11-13 ENCOUNTER — Other Ambulatory Visit: Payer: Self-pay

## 2022-11-23 NOTE — Progress Notes (Deleted)
[facility] OF   ASSESSMENT / PLAN: 61 y.o. female with right shoulder pain, right forearm pain, numbness and paresthesias of the right fourth and fifth finger.  She does not describe classic symptoms of access related hand ischemia (steal syndrome).  She has a reassuring clinical exam with a palpable radial artery pulse and smooth thrill in the fistula.  Recent fistulogram showed no evidence of hemodynamically significant stenosis.  I am unsure why she is having so much discomfort with cannulating the fistula.  I told her that we can leave the catheter in until we get a better idea.  I encouraged her to follow-up with her nephrologist to discuss alternative dialysis access strategies such as peritoneal dialysis.  I will refer her to Raquel Sarna to help her get access to healthcare insurance so that she can pursue transplant.  I will refer her to Dr. Sammuel Hines for orthopedic evaluation. I'll plan to see her back in 1-2 months after the above.   CHIEF COMPLAINT: Right arm pain, pain with cannulation of fistula  HISTORY OF PRESENT ILLNESS: Sabrina Mitchell is a 61 y.o. female who returns to clinic for evaluation of multiple right upper extremity complaints.  All history is per her daughter who acts as a Optometrist for her.  Patient describes intermittent right shoulder discomfort, which she relates to dialysis access cannulation.  She describes intense discomfort with needle cannulation of the fistula.  She describes pain in the right forearm.  She describes numbness and tingling in the right fourth and fifth digit.  She does not describe the classic constant severe pain in the fingers and hand of access related hand ischemia.  She has no ulceration about her hands.  Past Medical History:  Diagnosis Date   Anemia    Anxiety    Complication of anesthesia    Depression    denies   End stage renal disease ([place])    hemodialysis 3x/wk @ [place]   Family  history of adverse reaction to anesthesia    sister vomiting after uterine cyst removal   Gastritis    GERD (gastroesophageal reflux disease)    Hemorrhoids    Hypertension    Pneumonia    years ago   PONV (postoperative nausea and vomiting)    UTI (lower urinary tract infection) March 2014    Past Surgical History:  Procedure Laterality Date   A/V FISTULAGRAM Right 06/12/2022   Procedure: A/V Fistulagram;  Surgeon: Angelia Mould, MD;  Location: [place] [facility];  Service: Cardiovascular;  Laterality: Right;   AV FISTULA PLACEMENT Left 07/05/09   Dr. Kellie Simmering   AV FISTULA PLACEMENT Right 09/26/2021   Procedure: RIGHT ARM ARTERIOVENOUS (AV) FISTULA CREATION;  Surgeon: Waynetta Sandy, MD;  Location: [place];  Service: Vascular;  Laterality: Right;   COLONOSCOPY N/A 02/10/2016   SLF: 1. HEME postive stool due to colon polyps intrernal hemorrhoids 2. small internal hemrrohoids 3. moderate external hemorrhoids.    ESOPHAGEAL DILATION N/A 02/10/2016   Procedure: ESOPHAGEAL DILATION;  Surgeon: Danie Binder, MD;  [facility];  Service: Endoscopy;  Laterality: N/A;   ESOPHAGOGASTRODUODENOSCOPY N/A 02/10/2016   SLF: patent Schatzki's ring , mild gastritis/ duodentitis.    FISTULA SUPERFICIALIZATION Left 87/04/6432   Procedure: PLICATION BRACHIOCEPHALIC FISTULA;  Surgeon: Angelia Mould, MD;  Location: [place];  Service: Vascular;  Laterality: Left;   FISTULOGRAM N/A 10/22/2014   Procedure: FISTULOGRAM;  Surgeon: Angelia Mould, MD;  Location: [facility]  CATH LAB;  Service: Cardiovascular;  Laterality: N/A;   INSERTION OF DIALYSIS CATHETER Right 08/27/2021   Procedure: INSERTION OF 19cm TUNNELED DIALYSIS CATHETER INTO RIGHT NECK;  Surgeon: Marty Heck, MD;  Location: [place];  Service: Vascular;  Laterality: Right;   IR DIALY SHUNT INTRO NEEDLE/INTRACATH INITIAL W/IMG LEFT Left 02/01/2019   IR DIALY SHUNT INTRO NEEDLE/INTRACATH INITIAL W/IMG LEFT Left 06/05/2020    IR DIALY SHUNT INTRO NEEDLE/INTRACATH INITIAL W/IMG LEFT Left 05/09/2021   IR THROMBECTOMY AV FISTULA W/THROMBOLYSIS/PTA INC/SHUNT/IMG LEFT Left 08/18/2021   IR US GUIDE VASC ACCESS LEFT  08/18/2021   PERIPHERAL VASCULAR BALLOON ANGIOPLASTY Left 07/14/2021   Procedure: PERIPHERAL VASCULAR BALLOON ANGIOPLASTY;  Surgeon: Waynetta Sandy, MD;  Location: [place] [facility];  Service: Cardiovascular;  Laterality: Left;   PERIPHERAL VASCULAR CATHETERIZATION Left 12/09/2016   Procedure: A/V Fistulagram;  Surgeon: Waynetta Sandy, MD;  Location: [place] [facility];  Service: Cardiovascular;  Laterality: Left;   REVISON OF ARTERIOVENOUS FISTULA Left 74/07/1447   Procedure: PLICATION OF BRACHIOCEPHALIC FISTULA;  Surgeon: Conrad [place] Rock, MD;  Location: [place];  Service: Vascular;  Laterality: Left;   REVISON OF ARTERIOVENOUS FISTULA Left 08/09/2017   Procedure: REVISION OF ARTERIOVENOUS FISTULA LIGATION OF SIDE BRANCH;  Surgeon: Waynetta Sandy, MD;  Location: [place];  Service: Vascular;  Laterality: Left;   TUBAL LIGATION  2004    Family History  Problem Relation Age of Onset   Hypertension Mother    Hypertension Father    Hypertension Sister    Breast cancer Sister 43   Diabetes Brother    Hypertension Brother     Social History   Socioeconomic History   Marital status: Single    Spouse name: Not on file   Number of children: 2   Years of education: Not on file   Highest education level: Not on file  Occupational History   Not on file  Tobacco Use   Smoking status: Never   Smokeless tobacco: Never  Vaping Use   Vaping Use: Never used  Substance and Sexual Activity   Alcohol use: No   Drug use: No   Sexual activity: Not Currently    Birth control/protection: Surgical  Other Topics Concern   Not on file  Social History Narrative   Not on file   Social Determinants of Health   Financial Resource Strain: Unknown (09/27/2019)   Overall Financial Resource  Strain (CARDIA)    Difficulty of Paying Living Expenses: Patient refused  Food Insecurity: Unknown (09/27/2019)   Hunger Vital Sign    Worried About Running Out of Food in the Last Year: Patient refused    [place] in the Last Year: Patient refused  Transportation Needs: Unknown (09/27/2019)   PRAPARE - Transportation    Lack of Transportation (Medical): Patient refused    Lack of Transportation (Non-Medical): Patient refused  Physical Activity: Unknown (09/27/2019)   Exercise Vital Sign    Days of Exercise per Week: Patient refused    Minutes of Exercise per Session: Patient refused  Stress: Unknown (09/27/2019)   [employer]    Feeling of Stress : Patient refused  Social Connections: Unknown (09/27/2019)   Social Connection and Isolation Panel [NHANES]    Frequency of Communication with Friends and Family: Patient refused    Frequency of Social Gatherings with Friends and Family: Patient refused    Attends Religious Services: Patient refused    Active Member  of Clubs or Organizations: Patient refused    Attends Archivist Meetings: Patient refused    Marital Status: Patient refused  Intimate Partner Violence: Unknown (09/27/2019)   Humiliation, Afraid, Rape, and Kick questionnaire    Fear of Current or Ex-Partner: Patient refused    Emotionally Abused: Patient refused    Physically Abused: Patient refused    Sexually Abused: Patient refused    Allergies  Allergen Reactions   Covid-19 (Mrna) Vaccine Swelling    Tongue swelling   Morphine And Related Nausea And Vomiting   Remeron [Mirtazapine] Other (See Comments)    Chest pain   Zoloft [Sertraline] Other (See Comments)    Chest pain and chills   Elavil [Amitriptyline] Other (See Comments)    Makes her shake   Trazodone And Nefazodone Other (See Comments)    Makes her shake    Current Outpatient Medications  Medication Sig Dispense Refill    acetaminophen (TYLENOL) 500 MG tablet Take 500-1,000 mg by mouth every 6 (six) hours as needed for moderate pain or headache.     amLODipine (NORVASC) 5 MG tablet Take 1 tablet by mouth twice daily for high blood pressure 180 tablet 3   B Complex-C-Folic Acid (DIALYVITE 242) 0.8 MG TABS Take 1 tablet by mouth daily at 2 PM.     calcium carbonate (TUMS - DOSED IN MG ELEMENTAL CALCIUM) 500 MG chewable tablet Chew 1-2 tablets by mouth 3 (three) times daily as needed for indigestion or heartburn.     carvedilol (COREG) 12.5 MG tablet take 1 tab by mouth twice daily 180 tablet 3   ethyl chloride spray Spray 1 as directed to skin three times a week as directed apply a small amount of spray to dialysis access just before needle stick 116 mL 3   ethyl chloride spray Apply topically to dialysis fistula immediately prior to cannulation 116 mL 11   ethyl chloride spray Spray 1 as directed to skin three times a week as directed apply a small amount of spray to dialysis access just before needle stick 116 mL 3   ethyl chloride spray Appy a small amount of spray to dialysis access as directed just before needle stick 116 mL 3   ferric citrate (AURYXIA) 1 GM 210 MG(Fe) tablet Take 420-630 mg by mouth See admin instructions. Take 2-3 tablets (420 - 630 mg) by mouth with each meal or snack     nystatin-triamcinolone ointment (MYCOLOG) Apply 1 application topically 2 (two) times daily. 30 g 0   Omega-3 Fatty Acids (OMEGA 3 500) 500 MG CAPS Take 500 mg by mouth every other day.     omeprazole (PRILOSEC) 20 MG capsule Take 1 capsule (20 mg total) by mouth daily. 1 PO 30 mins prior to breakfast and supper (Patient taking differently: Take 20 mg by mouth daily as needed (heartburn).) 30 capsule 3   zolpidem (AMBIEN) 10 MG tablet Take 1 tablet (10 mg total) by mouth at bedtime as needed for sleep. 30 tablet 1   No current facility-administered medications for this visit.    PHYSICAL EXAM There were no vitals filed for  this visit.  Well-appearing woman in no acute distress Regular rate and rhythm Unlabored breathing Right upper extremity brachiocephalic arteriovenous fistula with smooth thrill.  Easily palpable right radial pulse.  PERTINENT LABORATORY AND RADIOLOGIC DATA  Most recent CBC    Latest Ref Rng & Units 06/12/2022   11:06 AM 11/11/2021    4:48 PM 09/26/2021  8:27 AM  CBC  WBC 4.0 - 10.5 K/uL  7.5    Hemoglobin 12.0 - 15.0 g/dL 12.2  13.0  13.3   Hematocrit 36.0 - 46.0 % 36.0  37.9  39.0   Platelets 150 - 400 K/uL  174       Most recent CMP    Latest Ref Rng & Units 06/12/2022   11:06 AM 03/09/2022    9:35 AM 11/11/2021    4:48 PM  CMP  Glucose 70 - 99 mg/dL 80  84  75   BUN 6 - 20 mg/dL 28  59  20   Creatinine 0.44 - 1.00 mg/dL 6.50  8.20  4.07   Sodium 135 - 145 mmol/L 137  137  134   Potassium 3.5 - 5.1 mmol/L 4.0  4.3  3.0   Chloride 98 - 111 mmol/L 96  96  91   CO2 20 - 29 mmol/L  23  25   Calcium 8.7 - 10.2 mg/dL  9.8  9.7     Renal function CrCl cannot be calculated (Patient's most recent lab result is older than the maximum 21 days allowed.).  Hgb A1c MFr Bld (%)  Date Value  03/22/2017 4.8    LDL Chol Calc (NIH)  Date Value Ref Range Status  03/12/2021 111 (H) 0 - 99 mg/dL Final    Significant augmentation of second digit pressure seen with compression of the fistula.  Resting pressure of the second digit is well above the healing threshold.  Sabrina Mitchell. Stanford Breed, MD [facility] Phone Number: [phone] 11/23/2022 7:23 PM  Total time spent on preparing this encounter including chart review, data review, collecting history, examining the patient, coordinating care for this established patient, 40 minutes.  Portions of this report may have been transcribed using voice recognition software.  Every effort has been made to ensure accuracy; however, inadvertent computerized transcription errors may still be present.

## 2022-11-24 ENCOUNTER — Ambulatory Visit: Payer: Self-pay | Admitting: Vascular Surgery

## 2022-11-26 ENCOUNTER — Other Ambulatory Visit (HOSPITAL_COMMUNITY): Payer: Self-pay | Admitting: Internal Medicine

## 2022-11-26 DIAGNOSIS — Z992 Dependence on renal dialysis: Secondary | ICD-10-CM

## 2022-11-26 DIAGNOSIS — N186 End stage renal disease: Secondary | ICD-10-CM

## 2022-11-27 ENCOUNTER — Other Ambulatory Visit: Payer: Self-pay

## 2022-11-27 ENCOUNTER — Telehealth: Payer: Self-pay | Admitting: Internal Medicine

## 2022-11-27 DIAGNOSIS — F5104 Psychophysiologic insomnia: Secondary | ICD-10-CM

## 2022-11-27 MED ORDER — ZOLPIDEM TARTRATE 10 MG PO TABS: 10.0000 mg | ORAL_TABLET | Freq: Every evening | ORAL | 1 refills | Status: DC | PRN

## 2022-11-27 NOTE — Addendum Note (Signed)
Addended by: Karle Plumber B on: 11/27/2022 11:26 PM   Modules accepted: Orders

## 2022-11-27 NOTE — Telephone Encounter (Signed)
PATIENT is requesting refills for ambien sent to the [facility].

## 2022-12-01 ENCOUNTER — Encounter: Payer: Self-pay | Admitting: Internal Medicine

## 2022-12-04 ENCOUNTER — Ambulatory Visit (HOSPITAL_COMMUNITY)
Admission: RE | Admit: 2022-12-04 | Discharge: 2022-12-04 | Disposition: A | Discharge status: Home / Self Care | Payer: Self-pay | Source: Ambulatory Visit | Attending: Internal Medicine | Admitting: Internal Medicine

## 2022-12-04 DIAGNOSIS — N186 End stage renal disease: Secondary | ICD-10-CM | POA: Insufficient documentation

## 2022-12-04 DIAGNOSIS — Z4901 Encounter for fitting and adjustment of extracorporeal dialysis catheter: Secondary | ICD-10-CM | POA: Insufficient documentation

## 2022-12-04 HISTORY — PX: IR REMOVAL TUN CV CATH W/O FL: IMG2289

## 2022-12-04 MED ORDER — LIDOCAINE HCL 1 % IJ SOLN
INTRAMUSCULAR | Status: AC
  Filled 2022-12-04: qty 20

## 2022-12-04 NOTE — Procedures (Signed)
PROCEDURE SUMMARY:  Successful removal of tunneled hemodialysis catheter.  Patient tolerated well.  EBL < 5 mL  See full dictation in Imaging for details.  Armando Gang Uldine Fuster PA-C 12/04/2022 10:11 AM

## 2022-12-25 ENCOUNTER — Other Ambulatory Visit: Payer: Self-pay

## 2022-12-25 ENCOUNTER — Ambulatory Visit: Payer: Self-pay | Attending: Internal Medicine | Admitting: Internal Medicine

## 2022-12-25 ENCOUNTER — Encounter: Payer: Self-pay | Admitting: Internal Medicine

## 2022-12-25 ENCOUNTER — Telehealth: Payer: Self-pay

## 2022-12-25 VITALS — BP 129/75 | HR 63 | Temp 98.6°F | Ht 59.0 in | Wt 112.0 lb

## 2022-12-25 DIAGNOSIS — K219 Gastro-esophageal reflux disease without esophagitis: Secondary | ICD-10-CM

## 2022-12-25 DIAGNOSIS — M546 Pain in thoracic spine: Secondary | ICD-10-CM

## 2022-12-25 DIAGNOSIS — R200 Anesthesia of skin: Secondary | ICD-10-CM

## 2022-12-25 DIAGNOSIS — R1010 Upper abdominal pain, unspecified: Secondary | ICD-10-CM

## 2022-12-25 DIAGNOSIS — K649 Unspecified hemorrhoids: Secondary | ICD-10-CM

## 2022-12-25 MED ORDER — OMEPRAZOLE 20 MG PO CPDR
20.0000 mg | DELAYED_RELEASE_CAPSULE | Freq: Two times a day (BID) | ORAL | 3 refills | Status: DC
  Filled 2022-12-25: qty 60, 30d supply, fill #0
  Filled 2023-04-23: qty 60, 30d supply, fill #1

## 2022-12-25 MED ORDER — HYDROCORTISONE (PERIANAL) 2.5 % EX CREA
TOPICAL_CREAM | CUTANEOUS | 1 refills | Status: DC
  Filled 2022-12-25: qty 30, 15d supply, fill #0
  Filled 2023-04-23: qty 30, 15d supply, fill #1

## 2022-12-25 NOTE — Telephone Encounter (Signed)
Attempted to reach patient, no answer. Left message for patient to return call.

## 2022-12-25 NOTE — Telephone Encounter (Signed)
Received referral from Dr. Myrene Buddy for painful and difficulty during cannulation, aspiration of clots during cannulation - sent to scheduling for fistulogram.

## 2022-12-25 NOTE — Progress Notes (Signed)
Patient ID: Sabrina Mitchell, female    DOB: July 29, 1961  MRN: [id number]  CC: Pain (Experiencing back & foot pain, numbness on toes of L foot. Vance Gather pain - suspects its due to parathyroid issues. /Bumps on buttocks)   Subjective: Sabrina Mitchell is a 61 y.o. female who presents for UC visit Her concerns today include:  ESRD on HD, HTN, ACD, GERD, vit D def and insomnia.   C/o intermittent numbness in LT foot.  Only happens during HD with cramping in the foot as well.    Also intermittent  pain in upper to mid thoracic spine almost for a year.  More painful when she had subclavian HD catheter that was removed 3 wks ago. Worse when standing and doing house work.  Goes away when she is laying down.  No fever.   -no unexplained wgh changes.    Also requesting cream for hemorrhoids No blood in stool but last wk she noted blood when she wiped after a BM.  Reports she ate some spicy foods and feels this may have caused one of the hemorrhoids to burst. Denies any constipation or rectal pain but has some rectal itching at times.  Applies OTC hemorrhoid when she has itching Had c-scope 2017 that revealed internal and external hemorrhoids  She also reports pain in the abdomen.  She points to the epigastric area down to the suprapubic area.  She states that the pain occurs only when she eats spicy foods or greasy foods.  She is on omeprazole 20 mg daily but reports that she sometimes still gets pain even with that  Patient Active Problem List   Diagnosis Date Noted   Psychophysiological insomnia 02/13/2021   New onset of headaches 02/13/2021   Depression with anxiety 10/30/2020   Pulmonary edema 09/26/2019   Chest pain 09/26/2019   Elevated troponin 09/26/2019   Shortness of breath 05/16/2019   Calcaneal spur of right foot 10/10/2018   Ulnar neuropathy of left upper extremity 03/11/2018   GERD (gastroesophageal reflux disease) 01/17/2016   Pseudoaneurysm of arteriovenous  dialysis fistula ([place]) 10/23/2015   Dependence on renal dialysis ([place]) 10/15/2015   Type 2 diabetes mellitus with diabetic peripheral angiopathy without gangrene ([place]) 04/24/2015   Pruritus, unspecified 12/26/2014   Coagulation defect, unspecified ([place]) 12/18/2014   Type 2 diabetes mellitus with other diabetic kidney complication ([place]) 40/98/1191   Diarrhea, unspecified 09/13/2014   Fever, unspecified 08/30/2014   Pain, unspecified 08/30/2014   Generalized abdominal pain 04/05/2013   Post-menopausal bleeding 04/06/2012   End stage renal disease on dialysis ([place]) 04/06/2012   Hypertension 04/06/2012   Anemia in chronic kidney disease 03/10/2012   Secondary hyperparathyroidism of renal origin ([place]) 03/10/2012   Iron deficiency anemia, unspecified 01/23/2012   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 disease ([place]) 03/25/2010     Current Outpatient Medications on File Prior to Visit  Medication Sig Dispense Refill   acetaminophen (TYLENOL) 500 MG tablet Take 500-1,000 mg by mouth every 6 (six) hours as needed for moderate pain or headache.     amLODipine (NORVASC) 5 MG tablet Take 1 tablet by mouth twice daily for high blood pressure 180 tablet 3   B Complex-C-Folic Acid (DIALYVITE 478) 0.8 MG TABS Take 1 tablet by mouth daily at 2 PM.     calcium carbonate (TUMS - DOSED IN MG ELEMENTAL CALCIUM) 500 MG chewable tablet Chew 1-2 tablets by mouth 3 (three) times daily as needed  for indigestion or heartburn.     carvedilol (COREG) 12.5 MG tablet take 1 tab by mouth twice daily 180 tablet 3   ethyl chloride spray Spray 1 as directed to skin three times a week as directed apply a small amount of spray to dialysis access just before needle stick 116 mL 3   ethyl chloride spray Apply topically to dialysis fistula immediately prior to cannulation 116 mL 11   ethyl chloride spray Spray 1 as directed to skin three times a week as directed apply a small  amount of spray to dialysis access just before needle stick 116 mL 3   ethyl chloride spray Appy a small amount of spray to dialysis access as directed just before needle stick 116 mL 3   ferric citrate (AURYXIA) 1 GM 210 MG(Fe) tablet Take 420-630 mg by mouth See admin instructions. Take 2-3 tablets (420 - 630 mg) by mouth with each meal or snack     nystatin-triamcinolone ointment (MYCOLOG) Apply 1 application topically 2 (two) times daily. 30 g 0   Omega-3 Fatty Acids (OMEGA 3 500) 500 MG CAPS Take 500 mg by mouth every other day.     zolpidem (AMBIEN) 10 MG tablet Take 1 tablet (10 mg total) by mouth at bedtime as needed for sleep. 30 tablet 1   No current facility-administered medications on file prior to visit.    Allergies  Allergen Reactions   Covid-19 (Mrna) Vaccine Swelling    Tongue swelling   Morphine And Related Nausea And Vomiting   Remeron [Mirtazapine] Other (See Comments)    Chest pain   Zoloft [Sertraline] Other (See Comments)    Chest pain and chills   Elavil [Amitriptyline] Other (See Comments)    Makes her shake   Trazodone And Nefazodone Other (See Comments)    Makes her shake    Social History   Socioeconomic History   Marital status: Single    Spouse name: Not on file   Number of children: 2   Years of education: Not on file   Highest education level: Not on file  Occupational History   Not on file  Tobacco Use   Smoking status: Never   Smokeless tobacco: Never  Vaping Use   Vaping Use: Never used  Substance and Sexual Activity   Alcohol use: No   Drug use: No   Sexual activity: Not Currently    Birth control/protection: Surgical  Other Topics Concern   Not on file  Social History Narrative   Not on file   Social Determinants of Health   Financial Resource Strain: Unknown (09/27/2019)   Overall Financial Resource Strain (CARDIA)    Difficulty of Paying Living Expenses: Patient refused  Food Insecurity: Unknown (09/27/2019)   Hunger Vital  Sign    Worried About Running Out of Food in the Last Year: Patient refused    [place] in the Last Year: Patient refused  Transportation Needs: Unknown (09/27/2019)   PRAPARE - Transportation    Lack of Transportation (Medical): Patient refused    Lack of Transportation (Non-Medical): Patient refused  Physical Activity: Unknown (09/27/2019)   Exercise Vital Sign    Days of Exercise per Week: Patient refused    Minutes of Exercise per Session: Patient refused  Stress: Unknown (09/27/2019)   [place]    Feeling of Stress : Patient refused  Social Connections: Unknown (09/27/2019)   Social Connection and Isolation Panel [NHANES]  Frequency of Communication with Friends and Family: Patient refused    Frequency of Social Gatherings with Friends and Family: Patient refused    Attends Religious Services: Patient refused    Active Member of Clubs or Organizations: Patient refused    Attends Archivist Meetings: Patient refused    Marital Status: Patient refused  Intimate Partner Violence: Unknown (09/27/2019)   Humiliation, Afraid, Rape, and Kick questionnaire    Fear of Current or Ex-Partner: Patient refused    Emotionally Abused: Patient refused    Physically Abused: Patient refused    Sexually Abused: Patient refused    Family History  Problem Relation Age of Onset   Hypertension Mother    Hypertension Father    Hypertension Sister    Breast cancer Sister 46   Diabetes Brother    Hypertension Brother     Past Surgical History:  Procedure Laterality Date   A/V FISTULAGRAM Right 06/12/2022   Procedure: A/V Fistulagram;  Surgeon: Angelia Mould, MD;  Location: [facility];  Service: Cardiovascular;  Laterality: Right;   AV FISTULA PLACEMENT Left 07/05/09   Dr. Kellie Simmering   AV FISTULA PLACEMENT Right 09/26/2021   Procedure: RIGHT ARM ARTERIOVENOUS (AV) FISTULA CREATION;  Surgeon: Waynetta Sandy, MD;  Location: [facility];  Service: Vascular;  Laterality: Right;   COLONOSCOPY N/A 02/10/2016   SLF: 1. HEME postive stool due to colon polyps intrernal hemorrhoids 2. small internal hemrrohoids 3. moderate external hemorrhoids.    ESOPHAGEAL DILATION N/A 02/10/2016   Procedure: ESOPHAGEAL DILATION;  Surgeon: Danie Binder, MD;  [facility];  Service: Endoscopy;  Laterality: N/A;   ESOPHAGOGASTRODUODENOSCOPY N/A 02/10/2016   SLF: patent Schatzki's ring , mild gastritis/ duodentitis.    FISTULA SUPERFICIALIZATION Left 95/05/3874   Procedure: PLICATION BRACHIOCEPHALIC FISTULA;  Surgeon: Angelia Mould, MD;  Location: [facility];  Service: Vascular;  Laterality: Left;   FISTULOGRAM N/A 10/22/2014   Procedure: FISTULOGRAM;  Surgeon: Angelia Mould, MD;  Location: [facility];  Service: Cardiovascular;  Laterality: N/A;   INSERTION OF DIALYSIS CATHETER Right 08/27/2021   Procedure: INSERTION OF 19cm TUNNELED DIALYSIS CATHETER INTO RIGHT NECK;  Surgeon: Marty Heck, MD;  Location: [facility];  Service: Vascular;  Laterality: Right;   IR DIALY SHUNT INTRO NEEDLE/INTRACATH INITIAL W/IMG LEFT Left 02/01/2019   IR DIALY SHUNT INTRO NEEDLE/INTRACATH INITIAL W/IMG LEFT Left 06/05/2020   IR DIALY SHUNT INTRO NEEDLE/INTRACATH INITIAL W/IMG LEFT Left 05/09/2021   IR REMOVAL TUN CV CATH W/O FL  12/04/2022   IR THROMBECTOMY AV FISTULA W/THROMBOLYSIS/PTA INC/SHUNT/IMG LEFT Left 08/18/2021   IR US GUIDE VASC ACCESS LEFT  08/18/2021   PERIPHERAL VASCULAR BALLOON ANGIOPLASTY Left 07/14/2021   Procedure: PERIPHERAL VASCULAR BALLOON ANGIOPLASTY;  Surgeon: Waynetta Sandy, MD;  Location: [facility];  Service: Cardiovascular;  Laterality: Left;   PERIPHERAL VASCULAR CATHETERIZATION Left 12/09/2016   Procedure: A/V Fistulagram;  Surgeon: Waynetta Sandy, MD;  Location: [facility];  Service: Cardiovascular;  Laterality: Left;   REVISON OF ARTERIOVENOUS  FISTULA Left 64/33/2951   Procedure: PLICATION OF BRACHIOCEPHALIC FISTULA;  Surgeon: Conrad Deer Park, MD;  Location: [facility];  Service: Vascular;  Laterality: Left;   REVISON OF ARTERIOVENOUS FISTULA Left 08/09/2017   Procedure: REVISION OF ARTERIOVENOUS FISTULA LIGATION OF SIDE BRANCH;  Surgeon: Waynetta Sandy, MD;  Location: [facility];  Service: Vascular;  Laterality: Left;   TUBAL LIGATION  2004    ROS: Review of Systems  Negative except as stated above  PHYSICAL EXAM: BP 129/75 (BP Location: Left Arm, Patient Position: Sitting, Cuff Size: Normal)   Pulse 63   Temp 98.6 F (37 C) (Oral)   Ht '4\' 11"'$  (1.499 m)   Wt 112 lb (50.8 kg)   LMP 02/11/2012 Comment: tubal ligation  SpO2 99%   BMI 22.62 kg/m   Wt Readings from Last 3 Encounters:  12/25/22 112 lb (50.8 kg)  09/15/22 119 lb (54 kg)  08/07/22 114 lb 6.4 oz (51.9 kg)    Physical Exam  General appearance - alert, well appearing, and in no distress Mental status - normal mood, behavior, speech, dress, motor activity, and thought processes Chest - clear to auscultation, no wheezes, rales or rhonchi, symmetric air entry Heart - RRR Abdomen -nondistended.  Normal bowel sounds.  Mild epigastric, periumbilical and bilateral lower quadrant tenderness without guarding or rebound Extremities -no edema in the lower legs Feet: Warm.  Dorsalis pedis, posterior tibialis pulses are 3+ bilaterally.  Leap exam is normal. MSK: Mild tenderness on palpation of the thoracic spine from the upper to the mid thoracic spine at about the level of the bra strap.  Mild tightness and surrounding paraspinal muscles. Rectal: She has some skin tags and noninflamed hemorrhoid externally.  No tear noted in the skin.  Finger exam was not done.    Latest Ref Rng & Units 06/12/2022   11:06 AM 03/09/2022    9:35 AM 11/11/2021    4:48 PM  CMP  Glucose 70 - 99 mg/dL 80  84  75   BUN 6 - 20 mg/dL 28  59  20   Creatinine 0.44 - 1.00 mg/dL 6.50  8.20  4.07    Sodium 135 - 145 mmol/L 137  137  134   Potassium 3.5 - 5.1 mmol/L 4.0  4.3  3.0   Chloride 98 - 111 mmol/L 96  96  91   CO2 20 - 29 mmol/L  23  25   Calcium 8.7 - 10.2 mg/dL  9.8  9.7    Lipid Panel     Component Value Date/Time   CHOL 195 03/12/2021 0954   TRIG 109 03/12/2021 0954   HDL 65 03/12/2021 0954   CHOLHDL 3.0 03/12/2021 0954   CHOLHDL 2.8 Ratio 10/22/2010 2104   VLDL 30 10/22/2010 2104   LDLCALC 111 (H) 03/12/2021 0954    CBC    Component Value Date/Time   WBC 7.5 11/11/2021 1648   RBC 4.31 11/11/2021 1648   HGB 12.2 06/12/2022 1106   HGB 10.1 (L) 03/12/2021 0954   HCT 36.0 06/12/2022 1106   HCT 30.6 (L) 03/12/2021 0954   PLT 174 11/11/2021 1648   PLT 232 03/12/2021 0954   MCV 87.9 11/11/2021 1648   MCV 96 03/12/2021 0954   MCH 30.2 11/11/2021 1648   MCHC 34.3 11/11/2021 1648   RDW 13.7 11/11/2021 1648   RDW 14.3 03/12/2021 0954   LYMPHSABS 1.1 08/17/2021 1243   LYMPHSABS 1.1 03/12/2021 0954   MONOABS 0.3 08/17/2021 1243   EOSABS 0.0 08/17/2021 1243   EOSABS 0.1 03/12/2021 0954   BASOSABS 0.0 08/17/2021 1243   BASOSABS 0.0 03/12/2021 0954    ASSESSMENT AND PLAN: 1. Gastroesophageal reflux disease without esophagitis 2. Pain of upper abdomen -More consistent with GERD or gastritis.  GERD precautions given including foods to avoid.  She is not on any NSAIDs.  We will increase omeprazole to 20 mg twice a day. - omeprazole (PRILOSEC) 20 MG  capsule; Take 1 capsule (20 mg total) by mouth 2 (two) times daily before a meal. 1 PO 30 mins prior to breakfast and supper  Dispense: 60 capsule; Refill: 3  3. Hemorrhoids, unspecified hemorrhoid type Discussed the importance of keeping bowel movements soft and regular by increasing fiber.  However patient reports that constipation is not an issue for her.  We will prescribe some Anusol cream for her to use as needed - hydrocortisone (ANUSOL-HC) 2.5 % rectal cream; Apply to rectal area twice a day as needed for  itching and hemorrhoids  Dispense: 30 g; Refill: 1  4. Pain in thoracic spine Likely arthritis or pulm disease associated with end-stage renal disease.  We will get x-ray of the thoracic spine.  I recommend using Tylenol as needed.  Also recommend using a heating pad which she can purchase over-the-counter. - DG Thoracic Spine W/Swimmers; Future  5. Numbness of left foot Of questionable etiology but seems most associated with fluid shifts that occurred during hemodialysis.  Given that it resolves after dialysis, I do not have a good remedy for her at this time.    [employer] Language interpreter used during this encounter. #[id number], [place]  Patient was given the opportunity to ask questions.  Patient verbalized understanding of the plan and was able to repeat key elements of the plan.   This documentation was completed using Radio producer.  Any transcriptional errors are unintentional.  Orders Placed This Encounter  Procedures   DG Thoracic Spine W/Swimmers     Requested Prescriptions   Signed Prescriptions Disp Refills   omeprazole (PRILOSEC) 20 MG capsule 60 capsule 3    Sig: Take 1 capsule (20 mg total) by mouth 2 (two) times daily before a meal. 1 PO 30 mins prior to breakfast and supper   hydrocortisone (ANUSOL-HC) 2.5 % rectal cream 30 g 1    Sig: Apply to rectal area twice a day as needed for itching and hemorrhoids    Return in about 4 months (around 04/26/2023).  Karle Plumber, MD, FACP

## 2022-12-30 NOTE — Telephone Encounter (Addendum)
Left message for patient/daughter Sabrina Mitchell to return call.   Left message for Amedeo Gory at [facility] to return call in attempts to get patient scheduled for fistulogram.

## 2022-12-31 ENCOUNTER — Other Ambulatory Visit: Payer: Self-pay

## 2022-12-31 DIAGNOSIS — T82590A Other mechanical complication of surgically created arteriovenous fistula, initial encounter: Secondary | ICD-10-CM

## 2022-12-31 MED ORDER — SODIUM CHLORIDE 0.9 % IV SOLN: 250.0000 mL | INTRAVENOUS | Status: AC | PRN

## 2022-12-31 MED ORDER — SODIUM CHLORIDE 0.9% FLUSH: 3.0000 mL | Freq: Two times a day (BID) | INTRAVENOUS | Status: AC

## 2022-12-31 NOTE — Telephone Encounter (Signed)
Spoke with patient's daughter Vernie Shanks. Fistulogram scheduled for 01/06/23. Instructions provided- she verbalized understanding.

## 2023-01-01 ENCOUNTER — Encounter: Payer: Self-pay | Admitting: Internal Medicine

## 2023-01-04 ENCOUNTER — Other Ambulatory Visit: Payer: Self-pay

## 2023-01-05 NOTE — Telephone Encounter (Signed)
Received a call from patient's daughter requesting to cancel fistulogram for tomorrow without rescheduling at this time. States, the dialysis center is using a new method during dialysis on the fistula that has been working.

## 2023-01-06 ENCOUNTER — Ambulatory Visit (HOSPITAL_COMMUNITY): Admission: RE | Admit: 2023-01-06 | Payer: Self-pay | Source: Home / Self Care | Admitting: Vascular Surgery

## 2023-01-06 ENCOUNTER — Encounter (HOSPITAL_COMMUNITY): Admission: RE | Payer: Self-pay | Source: Home / Self Care

## 2023-01-06 SURGERY — A/V FISTULAGRAM
Anesthesia: LOCAL | Laterality: Right

## 2023-01-13 ENCOUNTER — Other Ambulatory Visit: Payer: Self-pay

## 2023-01-22 ENCOUNTER — Other Ambulatory Visit: Payer: Self-pay | Admitting: Internal Medicine

## 2023-01-22 ENCOUNTER — Other Ambulatory Visit: Payer: Self-pay

## 2023-01-22 DIAGNOSIS — R21 Rash and other nonspecific skin eruption: Secondary | ICD-10-CM

## 2023-01-22 DIAGNOSIS — F5104 Psychophysiologic insomnia: Secondary | ICD-10-CM

## 2023-01-22 MED ORDER — CARVEDILOL 12.5 MG PO TABS
12.5000 mg | ORAL_TABLET | Freq: Two times a day (BID) | ORAL | 3 refills | Status: DC
  Filled 2023-01-22: qty 60, 30d supply, fill #0

## 2023-01-22 MED ORDER — NYSTATIN-TRIAMCINOLONE 100000-0.1 UNIT/GM-% EX OINT
1.0000 | TOPICAL_OINTMENT | Freq: Two times a day (BID) | CUTANEOUS | 0 refills | Status: DC
  Filled 2023-01-22: qty 30, 15d supply, fill #0

## 2023-01-25 ENCOUNTER — Other Ambulatory Visit: Payer: Self-pay

## 2023-01-25 MED ORDER — CINACALCET HCL 90 MG PO TABS
ORAL_TABLET | ORAL | 11 refills | Status: DC
  Filled 2023-01-25: qty 30, 30d supply, fill #0

## 2023-01-26 ENCOUNTER — Other Ambulatory Visit: Payer: Self-pay

## 2023-01-26 MED ORDER — CARVEDILOL 12.5 MG PO TABS
12.5000 mg | ORAL_TABLET | Freq: Two times a day (BID) | ORAL | 3 refills | Status: DC
  Filled 2023-02-15: qty 60, 30d supply, fill #0
  Filled 2023-03-19: qty 60, 30d supply, fill #1
  Filled 2023-04-26: qty 60, 30d supply, fill #2
  Filled 2023-05-28: qty 60, 30d supply, fill #3
  Filled 2023-06-25: qty 60, 30d supply, fill #4
  Filled 2023-08-06: qty 60, 30d supply, fill #5
  Filled 2023-09-21: qty 180, 90d supply, fill #6

## 2023-02-02 ENCOUNTER — Other Ambulatory Visit: Payer: Self-pay

## 2023-02-08 ENCOUNTER — Other Ambulatory Visit: Payer: Self-pay

## 2023-02-08 MED ORDER — ZOLPIDEM TARTRATE 10 MG PO TABS: 10.0000 mg | ORAL_TABLET | Freq: Every evening | ORAL | 1 refills | Status: DC | PRN

## 2023-02-08 NOTE — Telephone Encounter (Signed)
Patient requesting refill for zolpidem.

## 2023-02-09 ENCOUNTER — Other Ambulatory Visit: Payer: Self-pay

## 2023-02-09 MED ORDER — AMLODIPINE BESYLATE 5 MG PO TABS
7.5000 mg | ORAL_TABLET | Freq: Every evening | ORAL | 3 refills | Status: DC
  Filled 2023-02-09: qty 135, 90d supply, fill #0

## 2023-02-15 ENCOUNTER — Other Ambulatory Visit: Payer: Self-pay

## 2023-03-19 ENCOUNTER — Other Ambulatory Visit: Payer: Self-pay

## 2023-03-24 ENCOUNTER — Other Ambulatory Visit: Payer: Self-pay | Admitting: Pharmacist

## 2023-03-24 ENCOUNTER — Other Ambulatory Visit: Payer: Self-pay

## 2023-03-24 DIAGNOSIS — F5104 Psychophysiologic insomnia: Secondary | ICD-10-CM

## 2023-03-24 MED ORDER — ZOLPIDEM TARTRATE 10 MG PO TABS: 10.0000 mg | ORAL_TABLET | Freq: Every evening | ORAL | 1 refills | Status: DC | PRN

## 2023-03-24 NOTE — Telephone Encounter (Signed)
Patient is requesting refill on zolpidem. Per protocol, this must be approved by pt's PCP. Routing to Dr. Wynetta Emery.

## 2023-04-23 ENCOUNTER — Other Ambulatory Visit: Payer: Self-pay

## 2023-04-23 ENCOUNTER — Encounter: Payer: Self-pay | Admitting: Internal Medicine

## 2023-04-23 ENCOUNTER — Ambulatory Visit: Payer: Self-pay | Attending: Internal Medicine | Admitting: Internal Medicine

## 2023-04-23 VITALS — BP 99/60 | HR 58 | Temp 98.3°F | Ht 59.0 in | Wt 110.0 lb

## 2023-04-23 DIAGNOSIS — Z131 Encounter for screening for diabetes mellitus: Secondary | ICD-10-CM

## 2023-04-23 DIAGNOSIS — N2581 Secondary hyperparathyroidism of renal origin: Secondary | ICD-10-CM

## 2023-04-23 DIAGNOSIS — K649 Unspecified hemorrhoids: Secondary | ICD-10-CM

## 2023-04-23 DIAGNOSIS — N186 End stage renal disease: Secondary | ICD-10-CM

## 2023-04-23 DIAGNOSIS — Z1231 Encounter for screening mammogram for malignant neoplasm of breast: Secondary | ICD-10-CM

## 2023-04-23 DIAGNOSIS — I129 Hypertensive chronic kidney disease with stage 1 through stage 4 chronic kidney disease, or unspecified chronic kidney disease: Secondary | ICD-10-CM

## 2023-04-23 DIAGNOSIS — Z992 Dependence on renal dialysis: Secondary | ICD-10-CM

## 2023-04-23 DIAGNOSIS — F5104 Psychophysiologic insomnia: Secondary | ICD-10-CM

## 2023-04-23 LAB — POCT GLYCOSYLATED HEMOGLOBIN (HGB A1C): HbA1c, POC (controlled diabetic range): 4.8 % (ref 0.0–7.0)

## 2023-04-23 LAB — GLUCOSE, POCT (MANUAL RESULT ENTRY): POC Glucose: 90 mg/dl (ref 70–99)

## 2023-04-23 MED ORDER — AMLODIPINE BESYLATE 5 MG PO TABS
5.0000 mg | ORAL_TABLET | Freq: Two times a day (BID) | ORAL | 3 refills | Status: DC
Start: 2023-04-23 — End: 2024-05-10
  Filled 2023-04-23: qty 180, 90d supply, fill #0
  Filled 2023-08-06: qty 60, 30d supply, fill #1
  Filled 2023-09-21: qty 180, 90d supply, fill #2
  Filled 2024-01-14: qty 180, 90d supply, fill #3

## 2023-04-23 NOTE — Progress Notes (Signed)
Patient ID: TACIE MCCUISTION, female    DOB: June 13, 1961  MRN: [id number]  CC: Follow-up (Follow-up.  (Please see duplicate meds, unsure which ones to remove)/Requesting phone number for mammogram. /Itching on buttocks, bumps - requesting hydrocortizone/BP on L leg = 189/81)   Subjective: Sabrina Mitchell is a 62 y.o. female who presents for chronic ds management Her concerns today include:  ESRD on HD, HTN, ACD, GERD, vit D def, external hemorrhoids and insomnia.   [employer] Language interpreter used during this encounter. # Sabrina Mitchell [phone]   I note dx of DM on pt's chart but she was never dx with DM. Pt also confirms she was never dx.   A1C today 4.8/BS 90  ESRD/HTN/:  goes to HD Tue/Thur/Sat consistently Reports SBP drops during HD to 110-120.  SBP increases to 140-150 post HD when she checks later in the evenings. Checks BP every day.  Has fistula in both arms but one in LT arm no longer works.  Too much pressure to check BP in LT arm so she checks BP in LT leg.  SBP 129-130s when she takes her meds: Norvasc 5 mg BID and Coreg 12.5 mg BID.  Took both already this a.m -also on Ferric Citrate  for chronic anemia; has bottle with her. Thinks she is also on Sensipar "for my bones."  Dose decreased from 90 mg to 60 mg because it causes HA and vomiting.  She has history of secondary hyperparathyroidism associated with ESRD.  Request RF on hemorrhoid cream.  Gets rectal itching with flares.  Still taking and tolerating Ambien.  Some days she only takes 1/2 tablet of the 10 mg but most days she has to take the 10 mg to sleep.  No significant S.Es  HM:  MMG ordered last yr.  Has to reapply for MMG scholarship.  Due for pap  Patient Active Problem List   Diagnosis Date Noted   Psychophysiological insomnia 02/13/2021   New onset of headaches 02/13/2021   Depression with anxiety 10/30/2020   Pulmonary edema 09/26/2019   Chest pain 09/26/2019   Elevated troponin 09/26/2019   Shortness  of breath 05/16/2019   Calcaneal spur of right foot 10/10/2018   Ulnar neuropathy of left upper extremity 03/11/2018   GERD (gastroesophageal reflux disease) 01/17/2016   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HCC) 10/23/2015   Dependence on renal dialysis (HCC) 10/15/2015   Pruritus, unspecified 12/26/2014   Diarrhea, unspecified 09/13/2014   Fever, unspecified 08/30/2014   Pain, unspecified 08/30/2014   Generalized abdominal pain 04/05/2013   Post-menopausal bleeding 04/06/2012   End stage renal disease on dialysis (HCC) 04/06/2012   Hypertension 04/06/2012   Anemia in chronic kidney disease 03/10/2012   Secondary hyperparathyroidism of renal origin (HCC) 03/10/2012   Iron deficiency anemia, unspecified 01/23/2012   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     B Complex-C-Folic Acid (DIALYVITE 800) 0.8 MG TABS Take 1 tablet by mouth daily at 2 PM.     calcium carbonate (TUMS - DOSED IN MG ELEMENTAL CALCIUM) 500 MG chewable tablet Chew 1-2 tablets by mouth 3 (three) times daily as needed for indigestion or heartburn.     carvedilol (COREG) 12.5 MG tablet  Take 1 tablet (12.5 mg total) by mouth 2 (two) times daily for blood pressure. 180 tablet 3   cinacalcet (SENSIPAR) 60 MG tablet Take 60 mg by mouth daily.     ethyl chloride spray Spray 1 as directed to skin three times a week as directed apply a small amount of spray to dialysis access just before needle stick 116 mL 3   ferric citrate (AURYXIA) 1 GM 210 MG(Fe) tablet Take 420-630 mg by mouth See admin instructions. Take 2-3 tablets (420 - 630 mg) by mouth with each meal or snack     hydrocortisone (ANUSOL-HC) 2.5 % rectal cream Apply  to rectal area twice a day as needed for itching and hemorrhoids 30 g 1   nystatin-triamcinolone ointment (MYCOLOG) Apply 1 application topically 2 (two) times daily. 30 g 0   Omega-3 Fatty Acids (OMEGA 3 500) 500 MG CAPS Take 500 mg by mouth every other day.     omeprazole (PRILOSEC) 20 MG capsule Take 1 capsule (20 mg total) by mouth 2 (two) times daily before a meal. 1 PO 30 mins prior to breakfast and supper 60 capsule 3   zolpidem (AMBIEN) 10 MG tablet Take 1 tablet (10 mg total) by mouth at bedtime as needed for sleep. 30 tablet 1   Current Facility-Administered Medications on File Prior to Visit  Medication Dose Route Frequency Provider Last Rate Last Admin   0.9 %  sodium chloride infusion  250 mL Intravenous PRN Victorino Sparrow, MD       sodium chloride flush (NS) 0.9 % injection 3 mL  3 mL Intravenous Q12H Victorino Sparrow, MD        Allergies  Allergen Reactions   Covid-19 (Mrna) Vaccine Swelling    Tongue swelling   Morphine And Related Nausea And Vomiting   Remeron [Mirtazapine] Other (See Comments)    Chest pain   Zoloft [Sertraline] Other (See Comments)    Chest pain and chills   Elavil [Amitriptyline] Other (See Comments)    Makes her shake   Trazodone And Nefazodone Other (See Comments)    Makes her shake    Social History   Socioeconomic History   Marital status: Single    Spouse name: Not on file   Number of children: 2   Years of education: Not on file   Highest education level: Not on file  Occupational History   Not on file  Tobacco Use   Smoking status: Never   Smokeless tobacco: Never  Vaping Use   Vaping Use: Never used  Substance and Sexual Activity   Alcohol use: No   Drug use: No   Sexual activity: Not Currently    Birth control/protection: Surgical  Other Topics Concern   Not on file  Social History Narrative   Not on file   Social Determinants of Health   Financial Resource Strain: Unknown (09/27/2019)   Overall Financial Resource  Strain (CARDIA)    Difficulty of Paying Living Expenses: Patient declined  Food Insecurity: Unknown (09/27/2019)   Hunger Vital Sign    Worried About Running Out of Food in the Last Year: Patient declined    Ran Out of Food in the Last Year: Patient declined  Transportation Needs: Unknown (09/27/2019)   PRAPARE - Administrator, Civil Service (Medical): Patient declined    Lack of Transportation (Non-Medical): Patient declined  Physical Activity: Unknown (09/27/2019)   Exercise Vital Sign    Days of Exercise per Week: Patient declined  Minutes of Exercise per Session: Patient declined  Stress: Unknown (09/27/2019)   [employer] of Occupational Health - Occupational Stress Questionnaire    Feeling of Stress : Patient declined  Social Connections: Unknown (09/27/2019)   Social Connection and Isolation Panel [NHANES]    Frequency of Communication with Friends and Family: Patient declined    Frequency of Social Gatherings with Friends and Family: Patient declined    Attends Religious Services: Patient declined    Active Member of Clubs or Organizations: Patient declined    Attends Banker Meetings: Patient declined    Marital Status: Patient declined  Intimate Partner Violence: Unknown (09/27/2019)   Humiliation, Afraid, Rape, and Kick questionnaire    Fear of Current or Ex-Partner: Patient declined    Emotionally Abused: Patient declined    Physically Abused: Patient declined    Sexually Abused: Patient declined    Family History  Problem Relation Age of Onset   Hypertension Mother    Hypertension Father    Hypertension Sister    Breast cancer Sister 51   Diabetes Brother    Hypertension Brother     Past Surgical History:  Procedure Laterality Date   A/V FISTULAGRAM Right 06/12/2022   Procedure: A/V Fistulagram;  Surgeon: Chuck Hint, MD;  Location: [facility] INVASIVE CV LAB;  Service: Cardiovascular;  Laterality: Right;   AV FISTULA PLACEMENT  Left 07/05/09   Dr. Hart [place]   AV FISTULA PLACEMENT Right 09/26/2021   Procedure: RIGHT ARM ARTERIOVENOUS (AV) FISTULA CREATION;  Surgeon: Maeola Harman, MD;  Location: [facility] OR;  Service: Vascular;  Laterality: Right;   COLONOSCOPY N/A 02/10/2016   SLF: 1. HEME postive stool due to colon polyps intrernal hemorrhoids 2. small internal hemrrohoids 3. moderate external hemorrhoids.    ESOPHAGEAL DILATION N/A 02/10/2016   Procedure: ESOPHAGEAL DILATION;  Surgeon: West Bali, MD;  Location: [facility] ENDO SUITE;  Service: Endoscopy;  Laterality: N/A;   ESOPHAGOGASTRODUODENOSCOPY N/A 02/10/2016   SLF: patent Schatzki's ring , mild gastritis/ duodentitis.    FISTULA SUPERFICIALIZATION Left 11/04/2018   Procedure: PLICATION BRACHIOCEPHALIC FISTULA;  Surgeon: Chuck Hint, MD;  Location: [facility] OR;  Service: Vascular;  Laterality: Left;   FISTULOGRAM N/A 10/22/2014   Procedure: FISTULOGRAM;  Surgeon: Chuck Hint, MD;  Location: [facility] CATH LAB;  Service: Cardiovascular;  Laterality: N/A;   INSERTION OF DIALYSIS CATHETER Right 08/27/2021   Procedure: INSERTION OF 19cm TUNNELED DIALYSIS CATHETER INTO RIGHT NECK;  Surgeon: Cephus Shelling, MD;  Location: [facility] OR;  Service: Vascular;  Laterality: Right;   IR DIALY SHUNT INTRO NEEDLE/INTRACATH INITIAL W/IMG LEFT Left 02/01/2019   IR DIALY SHUNT INTRO NEEDLE/INTRACATH INITIAL W/IMG LEFT Left 06/05/2020   IR DIALY SHUNT INTRO NEEDLE/INTRACATH INITIAL W/IMG LEFT Left 05/09/2021   IR REMOVAL TUN CV CATH W/O FL  12/04/2022   IR THROMBECTOMY AV FISTULA W/THROMBOLYSIS/PTA INC/SHUNT/IMG LEFT Left 08/18/2021   IR US GUIDE VASC ACCESS LEFT  08/18/2021   PERIPHERAL VASCULAR BALLOON ANGIOPLASTY Left 07/14/2021   Procedure: PERIPHERAL VASCULAR BALLOON ANGIOPLASTY;  Surgeon: Maeola Harman, MD;  Location: [facility] INVASIVE CV LAB;  Service: Cardiovascular;  Laterality: Left;   PERIPHERAL VASCULAR CATHETERIZATION Left 12/09/2016   Procedure: A/V Fistulagram;   Surgeon: Maeola Harman, MD;  Location: [facility] INVASIVE CV LAB;  Service: Cardiovascular;  Laterality: Left;   REVISON OF ARTERIOVENOUS FISTULA Left 10/28/2015   Procedure: PLICATION OF BRACHIOCEPHALIC FISTULA;  Surgeon: Fransisco Hertz, MD;  Location: [facility] OR;  Service: Vascular;  Laterality: Left;  REVISON OF ARTERIOVENOUS FISTULA Left 08/09/2017   Procedure: REVISION OF ARTERIOVENOUS FISTULA LIGATION OF SIDE BRANCH;  Surgeon: Maeola Harman, MD;  Location: [facility] OR;  Service: Vascular;  Laterality: Left;   TUBAL LIGATION  2004    ROS: Review of Systems Negative except as stated above  PHYSICAL EXAM: BP 99/60 (BP Location: Left Arm, Patient Position: Sitting, Cuff Size: Normal)   Pulse (!) 58   Temp 98.3 F (36.8 C) (Oral)   Ht 4\' 11"  (1.499 m)   Wt 110 lb (49.9 kg)   LMP 02/11/2012 Comment: tubal ligation  SpO2 99%   BMI 22.22 kg/m   Physical Exam  General appearance - alert, well appearing, and in no distress Mental status - normal mood, behavior, speech, dress, motor activity, and thought processes Chest - clear to auscultation, no wheezes, rales or rhonchi, symmetric air entry Heart - RRR, no gallops Extremities - no LE edema      Latest Ref Rng & Units 06/12/2022   11:06 AM 03/09/2022    9:35 AM 11/11/2021    4:48 PM  CMP  Glucose 70 - 99 mg/dL 80  84  75   BUN 6 - 20 mg/dL 28  59  20   Creatinine 0.44 - 1.00 mg/dL 7.82  9.56  2.13   Sodium 135 - 145 mmol/L 137  137  134   Potassium 3.5 - 5.1 mmol/L 4.0  4.3  3.0   Chloride 98 - 111 mmol/L 96  96  91   CO2 20 - 29 mmol/L  23  25   Calcium 8.7 - 10.2 mg/dL  9.8  9.7    Lipid Panel     Component Value Date/Time   CHOL 195 03/12/2021 0954   TRIG 109 03/12/2021 0954   HDL 65 03/12/2021 0954   CHOLHDL 3.0 03/12/2021 0954   CHOLHDL 2.8 Ratio 10/22/2010 2104   VLDL 30 10/22/2010 2104   LDLCALC 111 (H) 03/12/2021 0954    CBC    Component Value Date/Time   WBC 7.5 11/11/2021 1648   RBC 4.31  11/11/2021 1648   HGB 12.2 06/12/2022 1106   HGB 10.1 (L) 03/12/2021 0954   HCT 36.0 06/12/2022 1106   HCT 30.6 (L) 03/12/2021 0954   PLT 174 11/11/2021 1648   PLT 232 03/12/2021 0954   MCV 87.9 11/11/2021 1648   MCV 96 03/12/2021 0954   MCH 30.2 11/11/2021 1648   MCHC 34.3 11/11/2021 1648   RDW 13.7 11/11/2021 1648   RDW 14.3 03/12/2021 0954   LYMPHSABS 1.1 08/17/2021 1243   LYMPHSABS 1.1 03/12/2021 0954   MONOABS 0.3 08/17/2021 1243   EOSABS 0.0 08/17/2021 1243   EOSABS 0.1 03/12/2021 0954   BASOSABS 0.0 08/17/2021 1243   BASOSABS 0.0 03/12/2021 0954    ASSESSMENT AND PLAN:  1. ESRD on dialysis (HCC) -stable on HD 3x/wk  2. Secondary hyperparathyroidism of renal origin (HCC) On TUMs and Sensipar  3. Renal hypertension Continue Norvasc 5 mg BID and Coreg 12.5 mg BID - amLODipine (NORVASC) 5 MG tablet; Take 1 tablet by mouth twice daily for high blood pressure  Dispense: 180 tablet; Refill: 3  4. Encounter for screening mammogram for malignant neoplasm of breast Given form for MMG scholarship so we can resubmit - MM Digital Screening; Future  5. Hemorrhoids, unspecified hemorrhoid type RF Anusol  6. Diabetes mellitus screening Pt is not diabetic.  This dx has been removed from her medical list. - POCT glucose (manual entry) - POCT  glycosylated hemoglobin (Hb A1C)  7.  Chronic Insomnia -will continue to monitor her on Ambien for any significant S.E  Patient was given the opportunity to ask questions.  Patient verbalized understanding of the plan and was able to repeat key elements of the plan.   This documentation was completed using Paediatric nurse.  Any transcriptional errors are unintentional.  Orders Placed This Encounter  Procedures   MM Digital Screening   POCT glucose (manual entry)   POCT glycosylated hemoglobin (Hb A1C)     Requested Prescriptions   Signed Prescriptions Disp Refills   amLODipine (NORVASC) 5 MG tablet 180  tablet 3    Sig: Take 1 tablet (5 mg total) by mouth 2 (two) times daily for blood pressure.    Return in about 6 weeks (around 06/04/2023) for PAP.  Jonah Blue, MD, FACP

## 2023-04-26 ENCOUNTER — Other Ambulatory Visit: Payer: Self-pay

## 2023-04-26 ENCOUNTER — Ambulatory Visit: Payer: Self-pay | Admitting: Internal Medicine

## 2023-04-30 ENCOUNTER — Other Ambulatory Visit: Payer: Self-pay

## 2023-05-12 ENCOUNTER — Telehealth: Payer: Self-pay

## 2023-05-12 NOTE — Telephone Encounter (Signed)
Telephoned patient at mobile number using interpreter [phone]. Left a voice message with [facility] (scholarship) contact information.

## 2023-05-14 ENCOUNTER — Ambulatory Visit: Payer: Self-pay | Admitting: Internal Medicine

## 2023-05-28 ENCOUNTER — Other Ambulatory Visit: Payer: Self-pay

## 2023-06-16 ENCOUNTER — Other Ambulatory Visit: Payer: Self-pay

## 2023-06-24 ENCOUNTER — Other Ambulatory Visit: Payer: Self-pay

## 2023-06-24 MED ORDER — ETHYL CHLORIDE EX AERO
INHALATION_SPRAY | CUTANEOUS | 3 refills | Status: DC
  Filled 2023-06-24: qty 116, 30d supply, fill #0
  Filled 2023-07-09: qty 116, 30d supply, fill #1
  Filled 2023-09-01: qty 116, 30d supply, fill #2
  Filled 2023-10-06: qty 116, 30d supply, fill #3

## 2023-06-25 ENCOUNTER — Other Ambulatory Visit: Payer: Self-pay

## 2023-07-09 ENCOUNTER — Other Ambulatory Visit (HOSPITAL_COMMUNITY)
Admission: RE | Admit: 2023-07-09 | Discharge: 2023-07-09 | Disposition: A | Discharge status: Home / Self Care | Payer: Self-pay | Source: Ambulatory Visit | Attending: Internal Medicine | Admitting: Internal Medicine

## 2023-07-09 ENCOUNTER — Other Ambulatory Visit: Payer: Self-pay

## 2023-07-09 ENCOUNTER — Encounter: Payer: Self-pay | Admitting: Internal Medicine

## 2023-07-09 ENCOUNTER — Other Ambulatory Visit: Payer: Self-pay | Admitting: Internal Medicine

## 2023-07-09 ENCOUNTER — Ambulatory Visit: Payer: Self-pay | Attending: Internal Medicine | Admitting: Internal Medicine

## 2023-07-09 VITALS — BP 96/62 | HR 64 | Wt 110.0 lb

## 2023-07-09 DIAGNOSIS — G5603 Carpal tunnel syndrome, bilateral upper limbs: Secondary | ICD-10-CM

## 2023-07-09 DIAGNOSIS — Z1231 Encounter for screening mammogram for malignant neoplasm of breast: Secondary | ICD-10-CM

## 2023-07-09 DIAGNOSIS — Z124 Encounter for screening for malignant neoplasm of cervix: Secondary | ICD-10-CM | POA: Insufficient documentation

## 2023-07-09 DIAGNOSIS — K649 Unspecified hemorrhoids: Secondary | ICD-10-CM

## 2023-07-09 DIAGNOSIS — L858 Other specified epidermal thickening: Secondary | ICD-10-CM

## 2023-07-09 DIAGNOSIS — Z803 Family history of malignant neoplasm of breast: Secondary | ICD-10-CM

## 2023-07-09 MED ORDER — HYDROCORTISONE (PERIANAL) 2.5 % EX CREA
TOPICAL_CREAM | CUTANEOUS | 1 refills | Status: DC
Start: 2023-07-09 — End: 2023-08-09
  Filled 2023-07-09: qty 30, 15d supply, fill #0

## 2023-07-09 MED ORDER — HYDROCORTISONE (PERIANAL) 2.5 % EX CREA: TOPICAL_CREAM | CUTANEOUS | 1 refills | Status: DC

## 2023-07-09 NOTE — Patient Instructions (Signed)
Sndrome del tnel carpiano Carpal Tunnel Syndrome  El sndrome del tnel carpiano es una afeccin que causa dolor, adormecimiento y debilidad en la mano y los dedos. El tnel carpiano es un rea estrecha ubicada en el lado palmar de la mueca. Los movimientos repetidos de la mueca o determinadas enfermedades pueden causar la hinchazn del tnel. Esta hinchazn comprime el nervio principal de la mueca. El nervio principal de la mueca se llama "nervio mediano". Cules son las causas? Esta afeccin puede ser causada por lo siguiente: Movimientos repetidos y enrgicos de la mueca y la mano. Lesiones en la mueca. Artritis. Un quiste o un tumor en el tnel carpiano. Acumulacin de lquido durante el embarazo. Uso de herramientas que vibran. A veces, se desconoce la causa de esta afeccin. Qu incrementa el riesgo? Los siguientes factores pueden hacer que sea ms propenso a desarrollar esta afeccin: Tener un trabajo que requiera que mueva la mueca o la mano repetitivamente o enrgicamente o que utilice herramientas que vibran. Estos pueden ser, entre otros, los trabajos que implican usar una computadora, trabajar en una lnea de ensamblaje o trabajar con herramientas elctricas como taladros y lijadoras. Ser mujer. Tener ciertas afecciones, tales como: Diabetes. Obesidad. Tiroides hipoactiva (hipotiroidismo). Insuficiencia renal. Artritis reumatoide. Cules son los signos o sntomas? Los sntomas de esta afeccin incluyen: Sensacin de hormigueo en los dedos de la mano, especialmente el pulgar, el ndice y el dedo medio. Hormigueo o adormecimiento en la mano. Sensacin de dolor en todo el brazo, especialmente cuando la mueca y el codo estn flexionados durante mucho tiempo. Dolor en la mueca que sube por el brazo hasta el hombro. Dolor que baja hasta la palma de la mano o los dedos. Sensacin de debilidad en las manos. Tal vez tenga dificultad para tomar y sostener objetos. Los  sntomas pueden empeorar durante la noche. Cmo se diagnostica? Esta afeccin se diagnostica mediante los antecedentes mdicos y un examen fsico. Tambin pueden hacerle estudios, que incluyen los siguientes: Un electromiograma (EMG). Esta prueba mide las seales elctricas que los nervios les envan a los msculos. Estudio de conduccin nerviosa. Este estudio permite determinar si las seales elctricas pasan correctamente por los nervios. Estudios de diagnstico por imgenes, como radiografas, una ecografa y una resonancia magntica (RM). Estos estudios permiten detectar las posibles causas de la afeccin. Cmo se trata? El tratamiento de esta afeccin puede incluir: Cambios en el estilo de vida. Es importante que deje o cambie la actividad que caus la afeccin. Hacer ejercicio y actividades para fortalecer y estirar los msculos y los tendones (fisioterapia). Hacer cambios en el estilo de vida que lo ayuden con su afeccin y aprender a realizar sus actividades diarias de forma segura (terapia ocupacional). Analgsicos y antiinflamatorios. Esto puede incluir medicamentos que se inyectan en la mueca. Una frula o un dispositivo ortopdico para la mueca. Ciruga. Siga estas instrucciones en su casa: Si tiene una frula o un dispositivo ortopdico: Use la frula o el dispositivo ortopdico como se lo haya indicado el mdico. Quteselos solamente como se lo haya indicado el mdico. Afloje la frula o el dispositivo ortopdico si los dedos de las manos se le adormecen, siente hormigueos o se le enfran y se tornan de color azul. Mantenga la frula o el dispositivo ortopdico limpios. Si la frula o el dispositivo ortopdico no son impermeables: No deje que se mojen. Cbralos con un envoltorio hermtico cuando tome un bao de inmersin o una ducha. Control del dolor, la rigidez y la hinchazn Si se lo   indican, aplique hielo sobre la zona dolorida. Para hacer esto: Si tiene una frula o un  dispositivo ortopdico desmontable, quteselos como se lo haya indicado el mdico. Ponga el hielo en una bolsa plstica. Coloque una toalla entre la piel y la bolsa, o entre la frula o dispositivo ortopdico y la bolsa. Aplique el hielo durante 20 minutos, 2 o 3 veces por da. No se quede dormido con la bolsa de hielo sobre la piel. Retire el hielo si la piel se pone de color rojo brillante. Esto es muy importante. Si no puede sentir dolor, calor o fro, tiene un mayor riesgo de que se dae la zona. Mueva los dedos con frecuencia para reducir la rigidez y la hinchazn. Instrucciones generales Use los medicamentos de venta libre y los recetados solamente como se lo haya indicado el mdico. Descanse la mueca y la mano de toda actividad que le cause dolor. Si la afeccin tiene relacin con el trabajo, hable con su empleador sobre los cambios que pueden hacerse, por ejemplo, usar una almohadilla para apoyar la mueca mientras tipea. Haga los ejercicios como se lo hayan indicado el mdico, el fisioterapeuta o el terapeuta ocupacional. Cumpla con todas las visitas de seguimiento. Esto es importante. Comunquese con un mdico si: Aparecen nuevos sntomas. El dolor no se alivia con los medicamentos. Sus sntomas empeoran. Solicite ayuda de inmediato si: Tiene hormigueo o adormecimiento intensos en la mueca o la mano. Resumen El sndrome del tnel carpiano es una afeccin que causa dolor, adormecimiento y debilidad en la mano y los dedos. Generalmente se debe a movimientos repetidos de la mueca. El sndrome del tnel carpiano se trata mediante cambios en el estilo de vida y medicamentos. Tambin puede indicarse la ciruga. Siga las instrucciones del mdico sobre el uso de una frula, el descanso de la actividad, la asistencia a las visitas de seguimiento y llamar para pedir ayuda. Esta informacin no tiene como fin reemplazar el consejo del mdico. Asegrese de hacerle al mdico cualquier pregunta  que tenga. Document Revised: 05/31/2020 Document Reviewed: 05/31/2020 Elsevier Patient Education  2024 [employer]  

## 2023-07-09 NOTE — Progress Notes (Signed)
Patient ID: Sabrina Mitchell, female    DOB: 03-26-1961  MRN: [id number]  CC: Gynecologic Exam   Subjective: Sabrina Mitchell is a 62 y.o. female who presents for pap Her concerns today include:  ESRD on HD, HTN, ACD, GERD, vit D def, external hemorrhoids and insomnia.   [employer] Language interpreter used during this encounter. #[id number] Burkitt  GYN History:  Pt is G4P2 (2 abortion) Any hx of abn paps?: no Menses regular or irregular?:  NA/postmenopausal How long does menses last? NA Menstrual flow light or heavy?: NA Method of birth control?:  NA Any vaginal dischg at this time?: some itching at times on the RT labia Dysuria?: no Any hx of STI?: no Sexually active with how many partners: one female partner Desires STI screen: yes Last MMG: referral submitted on last visit 03/2023; given MMG scholarship form on last visit.  Has not been called as yet.  Waiting to get discount card Family hx of uterine, cervical or breast cancer?:  sister dx with breast cancer 6 yrs ago  C/o intermittent numbness in hands at nights x 2 mths.  Occurs mainly at nights  Has spot on side of LT eye brow.  There for a while; would like to have it removed   Patient Active Problem List   Diagnosis Date Noted   Psychophysiological insomnia 02/13/2021   New onset of headaches 02/13/2021   Depression with anxiety 10/30/2020   Pulmonary edema 09/26/2019   Chest pain 09/26/2019   Elevated troponin 09/26/2019   Shortness of breath 05/16/2019   Calcaneal spur of right foot 10/10/2018   Ulnar neuropathy of left upper extremity 03/11/2018   GERD (gastroesophageal reflux disease) 01/17/2016   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HCC) 10/23/2015   Dependence on renal dialysis (HCC) 10/15/2015   Pruritus, unspecified 12/26/2014   Diarrhea, unspecified 09/13/2014   Fever, unspecified 08/30/2014   Pain, unspecified 08/30/2014   Generalized abdominal pain 04/05/2013   Post-menopausal  bleeding 04/06/2012   End stage renal disease on dialysis (HCC) 04/06/2012   Hypertension 04/06/2012   Anemia in chronic kidney disease 03/10/2012   Secondary hyperparathyroidism of renal origin (HCC) 03/10/2012   Iron deficiency anemia, unspecified 01/23/2012   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     amLODipine (NORVASC) 5 MG tablet Take 1 tablet (5 mg total) by mouth 2 (two) times daily for blood pressure. 180 tablet 3   calcium carbonate (TUMS - DOSED IN MG ELEMENTAL CALCIUM) 500 MG chewable tablet Chew 1-2 tablets by mouth 3 (three) times daily as needed for indigestion or heartburn.     carvedilol (COREG) 12.5 MG tablet Take 1 tablet (12.5 mg total) by mouth 2 (two) times daily for blood pressure. 180 tablet 3   ferric citrate (AURYXIA) 1 GM 210 MG(Fe) tablet Take 420-630 mg by mouth See admin instructions. Take 2-3 tablets (420 - 630 mg) by mouth with each meal or snack     nystatin-triamcinolone ointment (MYCOLOG) Apply 1 application topically 2 (two) times daily. 30 g 0   Omega-3 Fatty Acids (OMEGA 3 500) 500 MG CAPS Take 500 mg by mouth every other day.     omeprazole (PRILOSEC) 20 MG capsule Take  1 capsule (20 mg total) by mouth 2 (two) times daily before a meal. 1 PO 30 mins prior to breakfast and supper 60 capsule 3   B Complex-C-Folic Acid (DIALYVITE 800) 0.8 MG TABS Take 1 tablet by mouth daily at 2 PM.     cinacalcet (SENSIPAR) 60 MG tablet Take 60 mg by mouth daily.     ethyl chloride spray Spray 1 as directed to skin three times a week as directed apply a small amount of spray to dialysis access just before needle stick 116 mL 3   hydrocortisone (ANUSOL-HC) 2.5 % rectal cream Apply to  rectal area twice a day as needed for itching and hemorrhoids 30 g 1   zolpidem (AMBIEN) 10 MG tablet Take 1 tablet (10 mg total) by mouth at bedtime as needed for sleep. 30 tablet 1   Current Facility-Administered Medications on File Prior to Visit  Medication Dose Route Frequency Provider Last Rate Last Admin   0.9 %  sodium chloride infusion  250 mL Intravenous PRN Victorino Sparrow, MD       sodium chloride flush (NS) 0.9 % injection 3 mL  3 mL Intravenous Q12H Victorino Sparrow, MD        Allergies  Allergen Reactions   Covid-19 (Mrna) Vaccine Swelling    Tongue swelling   Morphine And Codeine Nausea And Vomiting   Remeron [Mirtazapine] Other (See Comments)    Chest pain   Zoloft [Sertraline] Other (See Comments)    Chest pain and chills   Elavil [Amitriptyline] Other (See Comments)    Makes her shake   Trazodone And Nefazodone Other (See Comments)    Makes her shake    Social History   Socioeconomic History   Marital status: Single    Spouse name: Not on file   Number of children: 2   Years of education: Not on file   Highest education level: Not on file  Occupational History   Not on file  Tobacco Use   Smoking status: Never   Smokeless tobacco: Never  Vaping Use   Vaping status: Never Used  Substance and Sexual Activity   Alcohol use: No   Drug use: No   Sexual activity: Not Currently    Birth control/protection: Surgical  Other Topics Concern   Not on file  Social History Narrative   Not on file   Social Determinants of Health   Financial Resource Strain: Unknown (09/27/2019)   Overall Financial Resource Strain (CARDIA)    Difficulty of Paying Living Expenses: Patient declined  Food Insecurity: Unknown (09/27/2019)   Hunger Vital Sign    Worried About Running Out of Food in the Last Year: Patient declined    Ran Out of Food in the Last Year: Patient declined  Transportation Needs: Unknown (09/27/2019)   PRAPARE - Administrator, Civil Service  (Medical): Patient declined    Lack of Transportation (Non-Medical): Patient declined  Physical Activity: Unknown (09/27/2019)   Exercise Vital Sign    Days of Exercise per Week: Patient declined    Minutes of Exercise per Session: Patient declined  Stress: Unknown (09/27/2019)   [employer] of Occupational Health - Occupational Stress Questionnaire    Feeling of Stress : Patient declined  Social Connections: Unknown (09/27/2019)   Social Connection and Isolation Panel [NHANES]    Frequency of Communication with Friends and Family: Patient declined    Frequency of Social Gatherings with Friends and Family: Patient declined    Attends  Religious Services: Patient declined    Active Member of Clubs or Organizations: Patient declined    Attends Banker Meetings: Patient declined    Marital Status: Patient declined  Intimate Partner Violence: Unknown (09/27/2019)   Humiliation, Afraid, Rape, and Kick questionnaire    Fear of Current or Ex-Partner: Patient declined    Emotionally Abused: Patient declined    Physically Abused: Patient declined    Sexually Abused: Patient declined    Family History  Problem Relation Age of Onset   Hypertension Mother    Hypertension Father    Hypertension Sister    Breast cancer Sister 58   Diabetes Brother    Hypertension Brother     Past Surgical History:  Procedure Laterality Date   A/V FISTULAGRAM Right 06/12/2022   Procedure: A/V Fistulagram;  Surgeon: Chuck Hint, MD;  Location: [facility] INVASIVE CV LAB;  Service: Cardiovascular;  Laterality: Right;   AV FISTULA PLACEMENT Left 07/05/09   Dr. Hart [place]   AV FISTULA PLACEMENT Right 09/26/2021   Procedure: RIGHT ARM ARTERIOVENOUS (AV) FISTULA CREATION;  Surgeon: Maeola Harman, MD;  Location: [facility] OR;  Service: Vascular;  Laterality: Right;   COLONOSCOPY N/A 02/10/2016   SLF: 1. HEME postive stool due to colon polyps intrernal hemorrhoids 2. small internal hemrrohoids 3.  moderate external hemorrhoids.    ESOPHAGEAL DILATION N/A 02/10/2016   Procedure: ESOPHAGEAL DILATION;  Surgeon: West Bali, MD;  Location: [facility] ENDO SUITE;  Service: Endoscopy;  Laterality: N/A;   ESOPHAGOGASTRODUODENOSCOPY N/A 02/10/2016   SLF: patent Schatzki's ring , mild gastritis/ duodentitis.    FISTULA SUPERFICIALIZATION Left 11/04/2018   Procedure: PLICATION BRACHIOCEPHALIC FISTULA;  Surgeon: Chuck Hint, MD;  Location: [facility] OR;  Service: Vascular;  Laterality: Left;   FISTULOGRAM N/A 10/22/2014   Procedure: FISTULOGRAM;  Surgeon: Chuck Hint, MD;  Location: [facility] CATH LAB;  Service: Cardiovascular;  Laterality: N/A;   INSERTION OF DIALYSIS CATHETER Right 08/27/2021   Procedure: INSERTION OF 19cm TUNNELED DIALYSIS CATHETER INTO RIGHT NECK;  Surgeon: Cephus Shelling, MD;  Location: [facility] OR;  Service: Vascular;  Laterality: Right;   IR DIALY SHUNT INTRO NEEDLE/INTRACATH INITIAL W/IMG LEFT Left 02/01/2019   IR DIALY SHUNT INTRO NEEDLE/INTRACATH INITIAL W/IMG LEFT Left 06/05/2020   IR DIALY SHUNT INTRO NEEDLE/INTRACATH INITIAL W/IMG LEFT Left 05/09/2021   IR REMOVAL TUN CV CATH W/O FL  12/04/2022   IR THROMBECTOMY AV FISTULA W/THROMBOLYSIS/PTA INC/SHUNT/IMG LEFT Left 08/18/2021   IR US GUIDE VASC ACCESS LEFT  08/18/2021   PERIPHERAL VASCULAR BALLOON ANGIOPLASTY Left 07/14/2021   Procedure: PERIPHERAL VASCULAR BALLOON ANGIOPLASTY;  Surgeon: Maeola Harman, MD;  Location: [facility] INVASIVE CV LAB;  Service: Cardiovascular;  Laterality: Left;   PERIPHERAL VASCULAR CATHETERIZATION Left 12/09/2016   Procedure: A/V Fistulagram;  Surgeon: Maeola Harman, MD;  Location: [facility] INVASIVE CV LAB;  Service: Cardiovascular;  Laterality: Left;   REVISON OF ARTERIOVENOUS FISTULA Left 10/28/2015   Procedure: PLICATION OF BRACHIOCEPHALIC FISTULA;  Surgeon: Fransisco Hertz, MD;  Location: [facility] OR;  Service: Vascular;  Laterality: Left;   REVISON OF ARTERIOVENOUS FISTULA Left 08/09/2017    Procedure: REVISION OF ARTERIOVENOUS FISTULA LIGATION OF SIDE BRANCH;  Surgeon: Maeola Harman, MD;  Location: [facility] OR;  Service: Vascular;  Laterality: Left;   TUBAL LIGATION  2004    ROS: Review of Systems Negative except as stated above  PHYSICAL EXAM: BP 96/62 (BP Location: Left Wrist, Patient Position: Sitting, Cuff Size: Normal)   Pulse 64  Wt 110 lb (49.9 kg)   LMP 02/11/2012 Comment: tubal ligation  SpO2 98%   BMI 22.22 kg/m   Physical Exam  General appearance - alert, well appearing, and in no distress.  Patient is very talkative and has to be redirected often to answer questions Mental status - normal mood, behavior, speech, dress, motor activity, and thought processes Pelvic -CMA Carly Cherre Huger is present: No external vaginal lesions noted.  She has vaginal atrophy and dryness which made the exam a little bit uncomfortable for her.  Cervix appeared atrophy.  No cervical motion tenderness or adnexal masses. Neurological -hands: No signs of atrophy of intrinsic hand muscles.  Grip 4+/5 bilaterally.  Tinel's sign negative. Skin -small less than 1 cm cutaneous horn protruding from the lateral aspect of the left eyebrow.      Latest Ref Rng & Units 06/12/2022   11:06 AM 03/09/2022    9:35 AM 11/11/2021    4:48 PM  CMP  Glucose 70 - 99 mg/dL 80  84  75   BUN 6 - 20 mg/dL 28  59  20   Creatinine 0.44 - 1.00 mg/dL 1.61  0.96  0.45   Sodium 135 - 145 mmol/L 137  137  134   Potassium 3.5 - 5.1 mmol/L 4.0  4.3  3.0   Chloride 98 - 111 mmol/L 96  96  91   CO2 20 - 29 mmol/L  23  25   Calcium 8.7 - 10.2 mg/dL  9.8  9.7    Lipid Panel     Component Value Date/Time   CHOL 195 03/12/2021 0954   TRIG 109 03/12/2021 0954   HDL 65 03/12/2021 0954   CHOLHDL 3.0 03/12/2021 0954   CHOLHDL 2.8 Ratio 10/22/2010 2104   VLDL 30 10/22/2010 2104   LDLCALC 111 (H) 03/12/2021 0954    CBC    Component Value Date/Time   WBC 7.5 11/11/2021 1648   RBC 4.31 11/11/2021 1648   HGB  12.2 06/12/2022 1106   HGB 10.1 (L) 03/12/2021 0954   HCT 36.0 06/12/2022 1106   HCT 30.6 (L) 03/12/2021 0954   PLT 174 11/11/2021 1648   PLT 232 03/12/2021 0954   MCV 87.9 11/11/2021 1648   MCV 96 03/12/2021 0954   MCH 30.2 11/11/2021 1648   MCHC 34.3 11/11/2021 1648   RDW 13.7 11/11/2021 1648   RDW 14.3 03/12/2021 0954   LYMPHSABS 1.1 08/17/2021 1243   LYMPHSABS 1.1 03/12/2021 0954   MONOABS 0.3 08/17/2021 1243   EOSABS 0.0 08/17/2021 1243   EOSABS 0.1 03/12/2021 0954   BASOSABS 0.0 08/17/2021 1243   BASOSABS 0.0 03/12/2021 0954    ASSESSMENT AND PLAN: 1. Pap smear for cervical cancer screening - Cervicovaginal ancillary only - Cytology - PAP  2. Family history of breast cancer 3. Encounter for screening mammogram for malignant neoplasm of breast -I had my CMA provide her with information for the mammogram scholarship program so that she can follow-up on this and get scheduled for a mammogram.  I told her that it is very important she has a mammogram done given family history.  4. Cutaneous horn Patient states she is reapplying for the [facility] discount.  Once approved she will let me know so that we can refer to dermatology.  5. Bilateral carpal tunnel syndrome Discussed diagnosis and management with patient.  I recommend conservative measures first with trial of cock up wrist splints.  We currently do not have any in stock.  I have  printed a prescription for her to take to any medical supply store to get a pair.  Printed information given to her in Spanish on carpal tunnel syndrome. - For home use only DME Other see comment    Patient was given the opportunity to ask questions.  Patient verbalized understanding of the plan and was able to repeat key elements of the plan.   This documentation was completed using Paediatric nurse.  Any transcriptional errors are unintentional.  No orders of the defined types were placed in this encounter.  I spent 30  minutes dedicated to the care of this patient today.  This included previsit review of records, face-to-face time with patient discussing diagnosis and management.  All questions were answered.  Requested Prescriptions    No prescriptions requested or ordered in this encounter    No follow-ups on file.  Jonah Blue, MD, FACP

## 2023-07-12 LAB — CERVICOVAGINAL ANCILLARY ONLY
Bacterial Vaginitis (gardnerella): POSITIVE — AB
Candida Glabrata: NEGATIVE
Candida Vaginitis: NEGATIVE
Chlamydia: NEGATIVE
Comment: NEGATIVE
Comment: NEGATIVE
Comment: NEGATIVE
Comment: NEGATIVE
Comment: NEGATIVE
Comment: NORMAL
Neisseria Gonorrhea: NEGATIVE
Trichomonas: NEGATIVE

## 2023-07-13 ENCOUNTER — Other Ambulatory Visit: Payer: Self-pay | Admitting: Internal Medicine

## 2023-07-13 ENCOUNTER — Other Ambulatory Visit: Payer: Self-pay

## 2023-07-13 DIAGNOSIS — Z1231 Encounter for screening mammogram for malignant neoplasm of breast: Secondary | ICD-10-CM

## 2023-07-13 LAB — CYTOLOGY - PAP
Adequacy: ABSENT
Comment: NEGATIVE
Diagnosis: NEGATIVE
High risk HPV: NEGATIVE

## 2023-07-13 MED ORDER — METRONIDAZOLE 500 MG PO TABS
500.0000 mg | ORAL_TABLET | Freq: Two times a day (BID) | ORAL | 0 refills | Status: DC
  Filled 2023-07-13: qty 14, 7d supply, fill #0

## 2023-07-14 ENCOUNTER — Other Ambulatory Visit: Payer: Self-pay

## 2023-07-15 ENCOUNTER — Other Ambulatory Visit: Payer: Self-pay

## 2023-07-22 ENCOUNTER — Ambulatory Visit
Admission: RE | Admit: 2023-07-22 | Discharge: 2023-07-22 | Disposition: A | Discharge status: Home / Self Care | Payer: No Typology Code available for payment source | Source: Ambulatory Visit | Attending: Internal Medicine | Admitting: Internal Medicine

## 2023-07-22 DIAGNOSIS — Z1231 Encounter for screening mammogram for malignant neoplasm of breast: Secondary | ICD-10-CM

## 2023-07-30 ENCOUNTER — Other Ambulatory Visit: Payer: Self-pay | Admitting: Internal Medicine

## 2023-07-30 DIAGNOSIS — F5104 Psychophysiologic insomnia: Secondary | ICD-10-CM

## 2023-07-30 MED ORDER — ZOLPIDEM TARTRATE 10 MG PO TABS
10.0000 mg | ORAL_TABLET | Freq: Every evening | ORAL | 1 refills | Status: DC | PRN
Start: 2023-07-30 — End: 2023-11-05

## 2023-07-30 NOTE — Telephone Encounter (Signed)
Requested medication (s) are due for refill today {- yes  Requested medication (s) are on the active medication list -yes  Future visit scheduled -no  Last refill: 03/24/23 #30 1RF  Notes to clinic: non delegated Rx  Requested Prescriptions  Pending Prescriptions Disp Refills   zolpidem (AMBIEN) 10 MG tablet 30 tablet 1    Sig: Take 1 tablet (10 mg total) by mouth at bedtime as needed for sleep.     Not Delegated - Psychiatry:  Anxiolytics/Hypnotics Failed - 07/30/2023 11:46 AM      Failed - This refill cannot be delegated      Failed - Urine Drug Screen completed in last 360 days      Passed - Valid encounter within last 6 months    Recent Outpatient Visits           3 weeks ago Pap smear for cervical cancer screening   [place] [facility] Marcine Matar, MD   3 months ago ESRD on dialysis [facility])   [place] [facility] & [facility] Jonah Blue B, MD   7 months ago Gastroesophageal reflux disease without esophagitis   [place] [facility] Marcine Matar, MD   11 months ago Essential hypertension   [place] [facility] Marcine Matar, MD   1 year ago Chronic insomnia   [place] [facility] Marcine Matar, MD       Future Appointments             In 5 months Marcine Matar, MD [facility]               Requested Prescriptions  Pending Prescriptions Disp Refills   zolpidem (AMBIEN) 10 MG tablet 30 tablet 1    Sig: Take 1 tablet (10 mg total) by mouth at bedtime as needed for sleep.     Not Delegated - Psychiatry:  Anxiolytics/Hypnotics Failed - 07/30/2023 11:46 AM      Failed - This refill cannot be delegated      Failed - Urine Drug Screen completed in last 360 days      Passed - Valid encounter within last 6 months    Recent Outpatient Visits           3 weeks ago Pap smear for  cervical cancer screening   [place] [facility] Marcine Matar, MD   3 months ago ESRD on dialysis [facility])   [place] [facility] Jonah Blue B, MD   7 months ago Gastroesophageal reflux disease without esophagitis   [place] [facility] Marcine Matar, MD   11 months ago Essential hypertension   [place] [facility] Marcine Matar, MD   1 year ago Chronic insomnia   [place] [facility] Marcine Matar, MD       Future Appointments             In 5 months Laural Benes Binnie Rail, MD [facility]

## 2023-07-30 NOTE — Telephone Encounter (Signed)
Medication Refill - Medication: zolpidem (AMBIEN) 10 MG tablet   Has the patient contacted their pharmacy? No.Patient states she has to call her PCP first and not the pharmacy. As per the pharmacy and PCP    Preferred Pharmacy (with phone number or street name):  [id number] - [place] ([place]), [place] - [address] Phone: [phone]  Fax: [phone]       Has the patient been seen for an appointment in the last year OR does the patient have an upcoming appointment? Yes.    Agent: Please be advised that RX refills may take up to 3 business days. We ask that you follow-up with your pharmacy.

## 2023-08-06 ENCOUNTER — Other Ambulatory Visit: Payer: Self-pay

## 2023-08-09 ENCOUNTER — Ambulatory Visit: Payer: Self-pay | Attending: Internal Medicine | Admitting: Internal Medicine

## 2023-08-09 ENCOUNTER — Encounter: Payer: Self-pay | Admitting: Internal Medicine

## 2023-08-09 ENCOUNTER — Other Ambulatory Visit: Payer: Self-pay

## 2023-08-09 VITALS — BP 131/81 | HR 71 | Ht 59.0 in | Wt 112.0 lb

## 2023-08-09 DIAGNOSIS — K649 Unspecified hemorrhoids: Secondary | ICD-10-CM

## 2023-08-09 DIAGNOSIS — K219 Gastro-esophageal reflux disease without esophagitis: Secondary | ICD-10-CM

## 2023-08-09 DIAGNOSIS — K625 Hemorrhage of anus and rectum: Secondary | ICD-10-CM

## 2023-08-09 DIAGNOSIS — R21 Rash and other nonspecific skin eruption: Secondary | ICD-10-CM

## 2023-08-09 MED ORDER — NYSTATIN-TRIAMCINOLONE 100000-0.1 UNIT/GM-% EX OINT
1.0000 | TOPICAL_OINTMENT | Freq: Two times a day (BID) | CUTANEOUS | 0 refills | Status: DC
  Filled 2023-08-09: qty 30, 15d supply, fill #0

## 2023-08-09 MED ORDER — HYDROCORTISONE ACETATE 25 MG RE SUPP
25.0000 mg | Freq: Two times a day (BID) | RECTAL | 0 refills | Status: DC | PRN
  Filled 2023-08-09: qty 12, 6d supply, fill #0

## 2023-08-09 NOTE — Progress Notes (Signed)
Patient ID: Sabrina Mitchell, female    DOB: September 26, 1961  MRN: [id number]  CC: Rash (Blisters on buttocks, blood in stool, abdominal pain X2-3 weeks)   Subjective: Sabrina Mitchell is a 62 y.o. female who presents for UC Her concerns today include:  ESRD on HD, HTN, ACD, GERD, vit D def, external hemorrhoids and insomnia.   AMN Language interpreter used during this encounter. #[id number], Sabrina Mitchell.  Pt c/o having flare of hemorrhoids x 3-4 wks with rectal bleeding when she has bowel movement.  Blood on toilet paper when she wipes.  Has a lot of burning sensation and itching in the rectal area. Stools have been soft Using Anusol rectal cream that was prescribed previously.   Also reports a lot of burning in stomach especially if she eats spicy foods which she discontinued doing.  Suppose to be on Omeprazole 20 mg BID but has been taking only once a day She is afraid she may have cancer. She had colonoscopy back in 2017, 1 tubular adenoma removed.  Patient Active Problem List   Diagnosis Date Noted   Psychophysiological insomnia 02/13/2021   New onset of headaches 02/13/2021   Depression with anxiety 10/30/2020   Pulmonary edema 09/26/2019   Chest pain 09/26/2019   Elevated troponin 09/26/2019   Shortness of breath 05/16/2019   Calcaneal spur of right foot 10/10/2018   Ulnar neuropathy of left upper extremity 03/11/2018   GERD (gastroesophageal reflux disease) 01/17/2016   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HCC) 10/23/2015   Dependence on renal dialysis (HCC) 10/15/2015   Pruritus, unspecified 12/26/2014   Diarrhea, unspecified 09/13/2014   Fever, unspecified 08/30/2014   Pain, unspecified 08/30/2014   Generalized abdominal pain 04/05/2013   Post-menopausal bleeding 04/06/2012   End stage renal disease on dialysis (HCC) 04/06/2012   Hypertension 04/06/2012   Anemia in chronic kidney disease 03/10/2012   Secondary hyperparathyroidism of renal origin (HCC)  03/10/2012   Iron deficiency anemia, unspecified 01/23/2012   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     amLODipine (NORVASC) 5 MG tablet Take 1 tablet (5 mg total) by mouth 2 (two) times daily for blood pressure. 180 tablet 3   calcium carbonate (TUMS - DOSED IN MG ELEMENTAL CALCIUM) 500 MG chewable tablet Chew 1-2 tablets by mouth 3 (three) times daily as needed for indigestion or heartburn.     carvedilol (COREG) 12.5 MG tablet Take 1 tablet (12.5 mg total) by mouth 2 (two) times daily for blood pressure. 180 tablet 3   ethyl chloride spray Spray 1 as directed to skin three times a week as directed apply a small amount of spray to dialysis access just before needle stick 116 mL 3   ferric citrate (AURYXIA) 1 GM 210 MG(Fe) tablet Take 420-630 mg by mouth See admin instructions. Take 2-3 tablets (420 - 630 mg) by mouth with each meal or snack     Omega-3 Fatty Acids (OMEGA 3 500) 500 MG CAPS Take 500 mg by mouth every other day.     omeprazole (PRILOSEC) 20 MG capsule Take 1 capsule (20 mg total) by mouth 2 (two) times daily before a meal. 1 PO 30 mins prior to breakfast and supper 60  capsule 3   zolpidem (AMBIEN) 10 MG tablet Take 1 tablet (10 mg total) by mouth at bedtime as needed for sleep. 30 tablet 1   B Complex-C-Folic Acid (DIALYVITE 800) 0.8 MG TABS Take 1 tablet by mouth daily at 2 PM. (Patient not taking: Reported on 08/09/2023)     cinacalcet (SENSIPAR) 60 MG tablet Take 60 mg by mouth daily. (Patient not taking: Reported on 08/09/2023)     Current Facility-Administered Medications on File Prior to Visit  Medication Dose Route Frequency Provider Last Rate Last Admin   0.9 %  sodium chloride  infusion  250 mL Intravenous PRN Victorino Sparrow, MD       sodium chloride flush (NS) 0.9 % injection 3 mL  3 mL Intravenous Q12H Victorino Sparrow, MD        Allergies  Allergen Reactions   Covid-19 (Mrna) Vaccine Swelling    Tongue swelling   Morphine And Codeine Nausea And Vomiting   Remeron [Mirtazapine] Other (See Comments)    Chest pain   Zoloft [Sertraline] Other (See Comments)    Chest pain and chills   Elavil [Amitriptyline] Other (See Comments)    Makes her shake   Trazodone And Nefazodone Other (See Comments)    Makes her shake    Social History   Socioeconomic History   Marital status: Single    Spouse name: Not on file   Number of children: 2   Years of education: Not on file   Highest education level: Not on file  Occupational History   Not on file  Tobacco Use   Smoking status: Never   Smokeless tobacco: Never  Vaping Use   Vaping status: Never Used  Substance and Sexual Activity   Alcohol use: No   Drug use: No   Sexual activity: Not Currently    Birth control/protection: Surgical  Other Topics Concern   Not on file  Social History Narrative   Not on file   Social Determinants of Health   Financial Resource Strain: Unknown (09/27/2019)   Overall Financial Resource Strain (CARDIA)    Difficulty of Paying Living Expenses: Patient declined  Food Insecurity: Unknown (09/27/2019)   Hunger Vital Sign    Worried About Running Out of Food in the Last Year: Patient declined    Ran Out of Food in the Last Year: Patient declined  Transportation Needs: Unknown (09/27/2019)   PRAPARE - Administrator, Civil Service (Medical): Patient declined    Lack of Transportation (Non-Medical): Patient declined  Physical Activity: Unknown (09/27/2019)   Exercise Vital Sign    Days of Exercise per Week: Patient declined    Minutes of Exercise per Session: Patient declined  Stress: Unknown (09/27/2019)   [employer] of Occupational Health - Occupational  Stress Questionnaire    Feeling of Stress : Patient declined  Social Connections: Unknown (09/27/2019)   Social Connection and Isolation Panel [NHANES]    Frequency of Communication with Friends and Family: Patient declined    Frequency of Social Gatherings with Friends and Family: Patient declined    Attends Religious Services: Patient declined    Database administrator or Organizations: Patient declined    Attends Banker Meetings: Patient declined    Marital Status: Patient declined  Intimate Partner Violence: Unknown (09/27/2019)   Humiliation, Afraid, Rape, and Kick questionnaire    Fear of Current or Ex-Partner: Patient declined    Emotionally Abused: Patient declined    Physically Abused: Patient  declined    Sexually Abused: Patient declined    Family History  Problem Relation Age of Onset   Hypertension Mother    Hypertension Father    Hypertension Sister    Breast cancer Sister 92   Diabetes Brother    Hypertension Brother     Past Surgical History:  Procedure Laterality Date   A/V FISTULAGRAM Right 06/12/2022   Procedure: A/V Fistulagram;  Surgeon: Chuck Hint, MD;  Location: [facility] INVASIVE CV LAB;  Service: Cardiovascular;  Laterality: Right;   AV FISTULA PLACEMENT Left 07/05/09   Dr. Hart Rochester   AV FISTULA PLACEMENT Right 09/26/2021   Procedure: RIGHT ARM ARTERIOVENOUS (AV) FISTULA CREATION;  Surgeon: Maeola Harman, MD;  Location: [facility] OR;  Service: Vascular;  Laterality: Right;   COLONOSCOPY N/A 02/10/2016   SLF: 1. HEME postive stool due to colon polyps intrernal hemorrhoids 2. small internal hemrrohoids 3. moderate external hemorrhoids.    ESOPHAGEAL DILATION N/A 02/10/2016   Procedure: ESOPHAGEAL DILATION;  Surgeon: West Bali, MD;  Location: [facility] ENDO SUITE;  Service: Endoscopy;  Laterality: N/A;   ESOPHAGOGASTRODUODENOSCOPY N/A 02/10/2016   SLF: patent Schatzki's ring , mild gastritis/ duodentitis.    FISTULA SUPERFICIALIZATION Left  11/04/2018   Procedure: PLICATION BRACHIOCEPHALIC FISTULA;  Surgeon: Chuck Hint, MD;  Location: [facility] OR;  Service: Vascular;  Laterality: Left;   FISTULOGRAM N/A 10/22/2014   Procedure: FISTULOGRAM;  Surgeon: Chuck Hint, MD;  Location: [facility] CATH LAB;  Service: Cardiovascular;  Laterality: N/A;   INSERTION OF DIALYSIS CATHETER Right 08/27/2021   Procedure: INSERTION OF 19cm TUNNELED DIALYSIS CATHETER INTO RIGHT NECK;  Surgeon: Cephus Shelling, MD;  Location: [facility] OR;  Service: Vascular;  Laterality: Right;   IR DIALY SHUNT INTRO NEEDLE/INTRACATH INITIAL W/IMG LEFT Left 02/01/2019   IR DIALY SHUNT INTRO NEEDLE/INTRACATH INITIAL W/IMG LEFT Left 06/05/2020   IR DIALY SHUNT INTRO NEEDLE/INTRACATH INITIAL W/IMG LEFT Left 05/09/2021   IR REMOVAL TUN CV CATH W/O FL  12/04/2022   IR THROMBECTOMY AV FISTULA W/THROMBOLYSIS/PTA INC/SHUNT/IMG LEFT Left 08/18/2021   IR US GUIDE VASC ACCESS LEFT  08/18/2021   PERIPHERAL VASCULAR BALLOON ANGIOPLASTY Left 07/14/2021   Procedure: PERIPHERAL VASCULAR BALLOON ANGIOPLASTY;  Surgeon: Maeola Harman, MD;  Location: [facility] INVASIVE CV LAB;  Service: Cardiovascular;  Laterality: Left;   PERIPHERAL VASCULAR CATHETERIZATION Left 12/09/2016   Procedure: A/V Fistulagram;  Surgeon: Maeola Harman, MD;  Location: [facility] INVASIVE CV LAB;  Service: Cardiovascular;  Laterality: Left;   REVISON OF ARTERIOVENOUS FISTULA Left 10/28/2015   Procedure: PLICATION OF BRACHIOCEPHALIC FISTULA;  Surgeon: Fransisco Hertz, MD;  Location: [facility] OR;  Service: Vascular;  Laterality: Left;   REVISON OF ARTERIOVENOUS FISTULA Left 08/09/2017   Procedure: REVISION OF ARTERIOVENOUS FISTULA LIGATION OF SIDE BRANCH;  Surgeon: Maeola Harman, MD;  Location: [facility] OR;  Service: Vascular;  Laterality: Left;   TUBAL LIGATION  2004    ROS: Review of Systems Negative except as stated above  PHYSICAL EXAM: BP 131/81 (BP Location: Left Leg)   Pulse 71   Ht 4\' 11"  (1.499 m)    Wt 112 lb (50.8 kg)   LMP 02/11/2012 Comment: tubal ligation  SpO2 99%   BMI 22.62 kg/m   Physical Exam  General appearance - alert, well appearing, and in no distress Mental status - normal mood, behavior, speech, dress, motor activity, and thought processes.  Pt is tearful Rectal -RN Sharlet Salina present: Patient with hemorrhoidal tag at the 6 o'clock position.  It is  tender to touch.  No thrombosis.  No abnormal masses felt in the rectal vault.  Hemoccult was negative. Skin between the gluteal fold is hypopigmented and waxy appearing     Latest Ref Rng & Units 06/12/2022   11:06 AM 03/09/2022    9:35 AM 11/11/2021    4:48 PM  CMP  Glucose 70 - 99 mg/dL 80  84  75   BUN 6 - 20 mg/dL 28  59  20   Creatinine 0.44 - 1.00 mg/dL 5.78  4.69  6.29   Sodium 135 - 145 mmol/L 137  137  134   Potassium 3.5 - 5.1 mmol/L 4.0  4.3  3.0   Chloride 98 - 111 mmol/L 96  96  91   CO2 20 - 29 mmol/L  23  25   Calcium 8.7 - 10.2 mg/dL  9.8  9.7    Lipid Panel     Component Value Date/Time   CHOL 195 03/12/2021 0954   TRIG 109 03/12/2021 0954   HDL 65 03/12/2021 0954   CHOLHDL 3.0 03/12/2021 0954   CHOLHDL 2.8 Ratio 10/22/2010 2104   VLDL 30 10/22/2010 2104   LDLCALC 111 (H) 03/12/2021 0954    CBC    Component Value Date/Time   WBC 7.5 11/11/2021 1648   RBC 4.31 11/11/2021 1648   HGB 12.2 06/12/2022 1106   HGB 10.1 (L) 03/12/2021 0954   HCT 36.0 06/12/2022 1106   HCT 30.6 (L) 03/12/2021 0954   PLT 174 11/11/2021 1648   PLT 232 03/12/2021 0954   MCV 87.9 11/11/2021 1648   MCV 96 03/12/2021 0954   MCH 30.2 11/11/2021 1648   MCHC 34.3 11/11/2021 1648   RDW 13.7 11/11/2021 1648   RDW 14.3 03/12/2021 0954   LYMPHSABS 1.1 08/17/2021 1243   LYMPHSABS 1.1 03/12/2021 0954   MONOABS 0.3 08/17/2021 1243   EOSABS 0.0 08/17/2021 1243   EOSABS 0.1 03/12/2021 0954   BASOSABS 0.0 08/17/2021 1243   BASOSABS 0.0 03/12/2021 0954    ASSESSMENT AND PLAN: 1. Hemorrhoids, unspecified hemorrhoid  type Discussed the importance of keeping bowel movements soft and regular.  She reports that her bowel movements have been soft.  Encourage foods high in fiber like green leafy vegetables. Stop Anusol cream.  Will give a trial of the Anusol suppository. Printed information given on hemorrhoids including recommendation for sitz bath. - Ambulatory referral to Gastroenterology - hydrocortisone (ANUSOL-HC) 25 MG suppository; Place 1 suppository (25 mg total) rectally 2 (two) times daily as needed for hemorrhoids or anal itching.  Dispense: 12 suppository; Refill: 0  2. Rectal bleeding See #1 above - Ambulatory referral to Gastroenterology  3. Rash Patient advised to stop putting the Anusol cream between the gluteal folds. - nystatin-triamcinolone ointment (MYCOLOG); Apply 1 application topically 2 (two) times daily.  Dispense: 30 g; Refill: 0  4. Gastroesophageal reflux disease without esophagitis Advised to increase omeprazole to 20 mg twice a day as prescribed.    Patient was given the opportunity to ask questions.  Patient verbalized understanding of the plan and was able to repeat key elements of the plan.   This documentation was completed using Paediatric nurse.  Any transcriptional errors are unintentional.  Orders Placed This Encounter  Procedures   Ambulatory referral to Gastroenterology     Requested Prescriptions   Signed Prescriptions Disp Refills   nystatin-triamcinolone ointment (MYCOLOG) 30 g 0    Sig: Apply 1 application topically 2 (two) times daily.   hydrocortisone (  ANUSOL-HC) 25 MG suppository 12 suppository 0    Sig: Place 1 suppository (25 mg total) rectally 2 (two) times daily as needed for hemorrhoids or anal itching.    No follow-ups on file.  Jonah Blue, MD, FACP

## 2023-08-09 NOTE — Patient Instructions (Signed)
Procedimientos no quirrgicos para el tratamiento de las hemorroides, cuidados posteriores Nonsurgical Procedures for Hemorrhoids, Care After Despus del procedimiento para tratar las hemorroides, es normal que tenga lo siguiente: Un poco de sangrado del recto durante [employer]. Molestias o dolor sordo cerca del recto. Siga estas indicaciones en su casa: Medicamentos Use los medicamentos de venta libre y los recetados solamente como se lo haya indicado el mdico. Use un reblandecedor de heces o un medicamento que lo ayude a Advertising copywriter (laxante) como se lo haya indicado el mdico. Comida y bebida Consuma alimentos con alto contenido de [place]. Entre ellos cereales integrales, frijoles, frutos secos, frutas y verduras. Beba suficiente lquido como para [employer] (la [place]) de color amarillo plido. Control del dolor y de la hinchazn  Tome baos de asiento tibios durante 20 minutos, 3 o 4 veces por da para Primary school teacher y las [place]. Puede hacer esto en una baera o usar un dispositivo porttil para bao de asiento que se coloca sobre el inodoro. Si se lo indican, aplique hielo en la zona afectada. El uso de compresas de hielo entre baos de asiento puede ser de [place]. Ponga el hielo en una bolsa plstica. Coloque una toalla entre la piel y Copy. Aplique el hielo durante 20 minutos, 2 a 3 veces por da. Si la piel se le pone de color rojo brillante, retire el hielo de inmediato para evitar daos en la piel. El [place] de dao es mayor si no puede sentir dolor, Airline pilot o fro. Actividad Es posible que deba Medical illustrator objetos. Pregntele al mdico cunto peso puede levantar sin correr [employer]. No permanezca sentado durante mucho tiempo sin moverse. Haga una caminata diaria o como se lo haya indicado el mdico. Retome sus actividades normales como se lo haya indicado el mdico. Pregntele al mdico qu actividades son seguras para usted. Indicaciones generales No haga fuerza para  defecar. No pase mucho tiempo sentado en el inodoro. El mdico podr darle instrucciones ms especficas. Asegrese de saber lo que puede y lo que no Scientist, product/process development. Comunquese con un mdico si: El medicamento no IT trainer. Tiene fiebre. Tiene estreimiento. Despus de [address], contina teniendo un sangrado ligero del recto. No puede hacer pis (orinar). Tiene un dolor muy intenso en el recto. Solicite ayuda de inmediato si: Tiene un sangrado abundante del recto. Esta informacin no tiene Theme park manager el consejo del mdico. Asegrese de hacerle al mdico cualquier pregunta que tenga. Document Revised: 09/14/2022 Document Reviewed: 09/14/2022 Elsevier Patient Education  2024 [employer].

## 2023-08-17 ENCOUNTER — Emergency Department (HOSPITAL_COMMUNITY)
Admission: EM | Admit: 2023-08-17 | Discharge: 2023-08-18 | Disposition: A | Discharge status: Home / Self Care | Payer: No Typology Code available for payment source | Attending: Emergency Medicine | Admitting: Emergency Medicine

## 2023-08-17 ENCOUNTER — Emergency Department (HOSPITAL_COMMUNITY)
Admission: EM | Admit: 2023-08-17 | Discharge: 2023-08-17 | Discharge status: Against Medical Advice | Payer: No Typology Code available for payment source

## 2023-08-17 ENCOUNTER — Other Ambulatory Visit: Payer: Self-pay

## 2023-08-17 ENCOUNTER — Encounter (HOSPITAL_COMMUNITY): Payer: Self-pay

## 2023-08-17 DIAGNOSIS — I12 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 disease: Secondary | ICD-10-CM | POA: Insufficient documentation

## 2023-08-17 DIAGNOSIS — Z79899 Other long term (current) drug therapy: Secondary | ICD-10-CM | POA: Insufficient documentation

## 2023-08-17 DIAGNOSIS — Z992 Dependence on renal dialysis: Secondary | ICD-10-CM | POA: Insufficient documentation

## 2023-08-17 DIAGNOSIS — K644 Residual hemorrhoidal skin tags: Secondary | ICD-10-CM | POA: Insufficient documentation

## 2023-08-17 DIAGNOSIS — N186 End stage renal disease: Secondary | ICD-10-CM | POA: Insufficient documentation

## 2023-08-17 NOTE — ED Provider Notes (Signed)
[place] EMERGENCY DEPARTMENT AT [facility] Provider Note   CSN: [id number] Arrival date & time: 08/17/23  2013     History {Add pertinent medical, surgical, social history, OB history to HPI:1} Chief Complaint  Patient presents with  . Hemorrhoids    Sabrina Mitchell is a 62 y.o. female.  HPI     Home Medications Prior to Admission medications   Medication Sig Start Date End Date Taking? Authorizing Provider  acetaminophen (TYLENOL) 500 MG tablet Take 500-1,000 mg by mouth every 6 (six) hours as needed for moderate pain or headache.    [provider]  amLODipine (NORVASC) 5 MG tablet Take 1 tablet (5 mg total) by mouth 2 (two) times daily for blood pressure. 04/23/23   Marcine Matar, MD  B Complex-C-Folic Acid (DIALYVITE 800) 0.8 MG TABS Take 1 tablet by mouth daily at 2 PM. Patient not taking: Reported on 08/09/2023 09/06/19   [provider]  calcium carbonate (TUMS - DOSED IN MG ELEMENTAL CALCIUM) 500 MG chewable tablet Chew 1-2 tablets by mouth 3 (three) times daily as needed for indigestion or heartburn.    [provider]  carvedilol (COREG) 12.5 MG tablet Take 1 tablet (12.5 mg total) by mouth 2 (two) times daily for blood pressure. 01/26/23     cinacalcet (SENSIPAR) 60 MG tablet Take 60 mg by mouth daily. Patient not taking: Reported on 08/09/2023 08/13/22   [provider]  ethyl chloride spray Spray 1 as directed to skin three times a week as directed apply a small amount of spray to dialysis access just before needle stick 06/24/23     ferric citrate (AURYXIA) 1 GM 210 MG(Fe) tablet Take 420-630 mg by mouth See admin instructions. Take 2-3 tablets (420 - 630 mg) by mouth with each meal or snack    [provider]  hydrocortisone (ANUSOL-HC) 25 MG suppository Place 1 suppository (25 mg total) rectally 2 (two) times daily as needed for hemorrhoids or anal itching. 08/09/23   Marcine Matar, MD   nystatin-triamcinolone ointment [facility]) Apply 1 application topically 2 (two) times daily. 08/09/23   Marcine Matar, MD  Omega-3 Fatty Acids (OMEGA 3 500) 500 MG CAPS Take 500 mg by mouth every other day.    [provider]  omeprazole (PRILOSEC) 20 MG capsule Take 1 capsule (20 mg total) by mouth 2 (two) times daily before a meal. 1 PO 30 mins prior to breakfast and supper 12/25/22   Marcine Matar, MD  zolpidem (AMBIEN) 10 MG tablet Take 1 tablet (10 mg total) by mouth at bedtime as needed for sleep. 07/30/23   Marcine Matar, MD      Allergies    Covid-19 (mrna) vaccine, Morphine and codeine, Remeron [mirtazapine], Zoloft [sertraline], Elavil [amitriptyline], and Trazodone and nefazodone    Review of Systems   Review of Systems  Physical Exam Updated Vital Signs BP (!) 149/98   Pulse 71   Temp 98.6 F (37 C) (Oral)   Resp 16   LMP 02/11/2012 Comment: tubal ligation  SpO2 100%  Physical Exam  ED Results / Procedures / Treatments   Labs (all labs ordered are listed, but only abnormal results are displayed) Labs Reviewed - No data to display  EKG None  Radiology No results found.  Procedures Procedures  {Document cardiac monitor, telemetry assessment procedure when appropriate:1}  Medications Ordered in ED Medications - No data to display  ED Course/ Medical Decision Making/ A&P   {  Click here for ABCD2, HEART and other calculatorsREFRESH Note before signing :1}                              Medical Decision Making  ***  {Document critical care time when appropriate:1} {Document review of labs and clinical decision tools ie heart score, Chads2Vasc2 etc:1}  {Document your independent review of radiology images, and any outside records:1} {Document your discussion with family members, caretakers, and with consultants:1} {Document social determinants of health affecting pt's care:1} {Document your decision making why or why not admission,  treatments were needed:1} Final Clinical Impression(s) / ED Diagnoses Final diagnoses:  None    Rx / DC Orders ED Discharge Orders     None

## 2023-08-17 NOTE — ED Notes (Signed)
Pt. Called on for triage x3 w/ no response. Moved to [facility]

## 2023-08-17 NOTE — ED Triage Notes (Signed)
Pt reports painful hemorrhoids

## 2023-08-18 ENCOUNTER — Other Ambulatory Visit: Payer: Self-pay

## 2023-08-18 NOTE — Discharge Instructions (Signed)
Por favor haga un seguimiento con la clnica de ciruga general.  Llame para programar Michail Jewels. Puedes intentar aplicar almohadillas Tucks.  Tambin puedes guardarlos en el refrigerador, lo que podra hacerlos sentir mejor cuando apliques la almohadilla. Considere pedir un cojn tipo donut o recogerlo en su farmacia local para sentarse mientras est en dilisis. Contine usando la crema para la erupcin segn lo prescrito por su mdico.  Esto puede tardar semanas en resolverse.   Please follow-up with the general surgery clinic.  Call to schedule an appointment tomorrow. You can try applying Tucks pads.  You can also store these in the refrigerator which might make them feel better when you apply the pad. Consider ordering a doughnut cushion or pick up from your local pharmacy to sit on while you are at dialysis. Continue using the cream for the rash as prescribed by your doctor.  This can take weeks to resolve.

## 2023-08-20 ENCOUNTER — Ambulatory Visit: Payer: Self-pay | Attending: Internal Medicine | Admitting: Internal Medicine

## 2023-08-20 ENCOUNTER — Encounter: Payer: Self-pay | Admitting: Internal Medicine

## 2023-08-20 ENCOUNTER — Other Ambulatory Visit (HOSPITAL_COMMUNITY): Payer: Self-pay

## 2023-08-20 VITALS — BP 97/64 | HR 71 | Ht 59.0 in | Wt 109.0 lb

## 2023-08-20 DIAGNOSIS — K645 Perianal venous thrombosis: Secondary | ICD-10-CM

## 2023-08-20 NOTE — Progress Notes (Signed)
Patient ID: Sabrina Mitchell, female    DOB: 07/24/1961  MRN: [id number]  CC: Follow-up (ER f/u - hemorroids /Reports that hemorroids are cuasing a burning sensation and radiating to the vagina, bleeding/Requesting referral to [facility])   Subjective: Sabrina Mitchell is a 62 y.o. female who presents for Er f/u Her concerns today include:  ESRD on HD, HTN, ACD, GERD, vit D def, external hemorrhoids and insomnia.   AMN Language interpreter used during this encounter. #[id number]  Patient seen in the ER yesterday for painful hemorrhoid. She was seen by me 12 days ago for the same and was prescribed Anusol suppository and referred to gastroenterology.  Patient states she has been using hemorrhoid cream but is no better.  She has a lot of pain and burning.  Sometimes she has bleeding from the hemorrhoids.  Hurts to sit down and even walk.  She has been sitting on a doughnut during her dialysis session but it is still painful.  In the ER they told her to follow-up with a surgeon at [facility]. Patient Active Problem List   Diagnosis Date Noted   Psychophysiological insomnia 02/13/2021   New onset of headaches 02/13/2021   Depression with anxiety 10/30/2020   Pulmonary edema 09/26/2019   Chest pain 09/26/2019   Elevated troponin 09/26/2019   Shortness of breath 05/16/2019   Calcaneal spur of right foot 10/10/2018   Ulnar neuropathy of left upper extremity 03/11/2018   GERD (gastroesophageal reflux disease) 01/17/2016   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HCC) 10/23/2015   Dependence on renal dialysis (HCC) 10/15/2015   Pruritus, unspecified 12/26/2014   Diarrhea, unspecified 09/13/2014   Fever, unspecified 08/30/2014   Pain, unspecified 08/30/2014   Generalized abdominal pain 04/05/2013   Post-menopausal bleeding 04/06/2012   End stage renal disease on dialysis (HCC) 04/06/2012   Hypertension 04/06/2012   Anemia in chronic kidney disease  03/10/2012   Secondary hyperparathyroidism of renal origin (HCC) 03/10/2012   Iron deficiency anemia, unspecified 01/23/2012   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     amLODipine (NORVASC) 5 MG tablet Take 1 tablet (5 mg total) by mouth 2 (two) times daily for blood pressure. 180 tablet 3   B Complex-C-Folic Acid (DIALYVITE 800) 0.8 MG TABS Take 1 tablet by mouth daily at 2 PM. (Patient not taking: Reported on 08/09/2023)     calcium carbonate (TUMS - DOSED IN MG ELEMENTAL CALCIUM) 500 MG chewable tablet Chew 1-2 tablets by mouth 3 (three) times daily as needed for indigestion or heartburn.     carvedilol (COREG) 12.5 MG tablet Take 1 tablet (12.5 mg total) by mouth 2 (two) times daily for blood pressure. 180 tablet 3   cinacalcet (SENSIPAR) 60 MG tablet Take 60 mg by mouth daily. (Patient not taking: Reported on 08/09/2023)     ethyl chloride spray Spray 1 as directed to skin three times a week as directed apply a small amount of spray to dialysis access just before needle stick 116 mL 3   ferric citrate (AURYXIA) 1 GM 210 MG(Fe) tablet Take 420-630 mg by mouth See admin instructions. Take 2-3 tablets (420 - 630 mg) by mouth with each meal or snack  hydrocortisone (ANUSOL-HC) 25 MG suppository Place 1 suppository (25 mg total) rectally 2 (two) times daily as needed for hemorrhoids or anal itching. 12 suppository 0   nystatin-triamcinolone ointment (MYCOLOG) Apply 1 application topically 2 (two) times daily. 30 g 0   Omega-3 Fatty Acids (OMEGA 3 500) 500 MG CAPS Take 500 mg by mouth every other day.     omeprazole (PRILOSEC) 20 MG capsule Take 1 capsule (20 mg total) by mouth 2 (two) times daily before a  meal. 1 PO 30 mins prior to breakfast and supper 60 capsule 3   zolpidem (AMBIEN) 10 MG tablet Take 1 tablet (10 mg total) by mouth at bedtime as needed for sleep. 30 tablet 1   Current Facility-Administered Medications on File Prior to Visit  Medication Dose Route Frequency Provider Last Rate Last Admin   0.9 %  sodium chloride infusion  250 mL Intravenous PRN Victorino Sparrow, MD       sodium chloride flush (NS) 0.9 % injection 3 mL  3 mL Intravenous Q12H Victorino Sparrow, MD        Allergies  Allergen Reactions   Covid-19 (Mrna) Vaccine Swelling    Tongue swelling   Morphine And Codeine Nausea And Vomiting   Remeron [Mirtazapine] Other (See Comments)    Chest pain   Zoloft [Sertraline] Other (See Comments)    Chest pain and chills   Elavil [Amitriptyline] Other (See Comments)    Makes her shake   Trazodone And Nefazodone Other (See Comments)    Makes her shake    Social History   Socioeconomic History   Marital status: Single    Spouse name: Not on file   Number of children: 2   Years of education: Not on file   Highest education level: Not on file  Occupational History   Not on file  Tobacco Use   Smoking status: Never   Smokeless tobacco: Never  Vaping Use   Vaping status: Never Used  Substance and Sexual Activity   Alcohol use: No   Drug use: No   Sexual activity: Not Currently    Birth control/protection: Surgical  Other Topics Concern   Not on file  Social History Narrative   Not on file   Social Determinants of Health   Financial Resource Strain: Unknown (09/27/2019)   Overall Financial Resource Strain (CARDIA)    Difficulty of Paying Living Expenses: Patient declined  Food Insecurity: Unknown (09/27/2019)   Hunger Vital Sign    Worried About Running Out of Food in the Last Year: Patient declined    Ran Out of Food in the Last Year: Patient declined  Transportation Needs: Unknown (09/27/2019)   PRAPARE - Administrator, Civil Service  (Medical): Patient declined    Lack of Transportation (Non-Medical): Patient declined  Physical Activity: Unknown (09/27/2019)   Exercise Vital Sign    Days of Exercise per Week: Patient declined    Minutes of Exercise per Session: Patient declined  Stress: Unknown (09/27/2019)   [employer] of Occupational Health - Occupational Stress Questionnaire    Feeling of Stress : Patient declined  Social Connections: Unknown (09/27/2019)   Social Connection and Isolation Panel [NHANES]    Frequency of Communication with Friends and Family: Patient declined    Frequency of Social Gatherings with Friends and Family: Patient declined    Attends Religious Services: Patient declined    Database administrator or Organizations: Patient declined    Attends Banker  Meetings: Patient declined    Marital Status: Patient declined  Intimate Partner Violence: Unknown (09/27/2019)   Humiliation, Afraid, Rape, and Kick questionnaire    Fear of Current or Ex-Partner: Patient declined    Emotionally Abused: Patient declined    Physically Abused: Patient declined    Sexually Abused: Patient declined    Family History  Problem Relation Age of Onset   Hypertension Mother    Hypertension Father    Hypertension Sister    Breast cancer Sister 98   Diabetes Brother    Hypertension Brother     Past Surgical History:  Procedure Laterality Date   A/V FISTULAGRAM Right 06/12/2022   Procedure: A/V Fistulagram;  Surgeon: Chuck Hint, MD;  Location[facility] INVASIVE CV LAB;  Service: Cardiovascular;  Laterality: Right;   AV FISTULA PLACEMENT Left 07/05/09   Dr. Hart [place]   AV FISTULA PLACEMENT Right 09/26/2021   Procedure: RIGHT ARM ARTERIOVENOUS (AV) FISTULA CREATION;  Surgeon: Maeola Harman, MD;  Location: [facility] OR;  Service: Vascular;  Laterality: Right;   COLONOSCOPY N/A 02/10/2016   SLF: 1. HEME postive stool due to colon polyps intrernal hemorrhoids 2. small internal hemrrohoids 3.  moderate external hemorrhoids.    ESOPHAGEAL DILATION N/A 02/10/2016   Procedure: ESOPHAGEAL DILATION;  Surgeon: West Bali, MD;  Location: [facility] ENDO SUITE;  Service: Endoscopy;  Laterality: N/A;   ESOPHAGOGASTRODUODENOSCOPY N/A 02/10/2016   SLF: patent Schatzki's ring , mild gastritis/ duodentitis.    FISTULA SUPERFICIALIZATION Left 11/04/2018   Procedure: PLICATION BRACHIOCEPHALIC FISTULA;  Surgeon: Chuck Hint, MD;  Location: [facility] OR;  Service: Vascular;  Laterality: Left;   FISTULOGRAM N/A 10/22/2014   Procedure: FISTULOGRAM;  Surgeon: Chuck Hint, MD;  Location: [facility] CATH LAB;  Service: Cardiovascular;  Laterality: N/A;   INSERTION OF DIALYSIS CATHETER Right 08/27/2021   Procedure: INSERTION OF 19cm TUNNELED DIALYSIS CATHETER INTO RIGHT NECK;  Surgeon: Cephus Shelling, MD;  Location: [facility] OR;  Service: Vascular;  Laterality: Right;   IR DIALY SHUNT INTRO NEEDLE/INTRACATH INITIAL W/IMG LEFT Left 02/01/2019   IR DIALY SHUNT INTRO NEEDLE/INTRACATH INITIAL W/IMG LEFT Left 06/05/2020   IR DIALY SHUNT INTRO NEEDLE/INTRACATH INITIAL W/IMG LEFT Left 05/09/2021   IR REMOVAL TUN CV CATH W/O FL  12/04/2022   IR THROMBECTOMY AV FISTULA W/THROMBOLYSIS/PTA INC/SHUNT/IMG LEFT Left 08/18/2021   IR US GUIDE VASC ACCESS LEFT  08/18/2021   PERIPHERAL VASCULAR BALLOON ANGIOPLASTY Left 07/14/2021   Procedure: PERIPHERAL VASCULAR BALLOON ANGIOPLASTY;  Surgeon: Maeola Harman, MD;  Location: [facility] INVASIVE CV LAB;  Service: Cardiovascular;  Laterality: Left;   PERIPHERAL VASCULAR CATHETERIZATION Left 12/09/2016   Procedure: A/V Fistulagram;  Surgeon: Maeola Harman, MD;  Location: [facility] INVASIVE CV LAB;  Service: Cardiovascular;  Laterality: Left;   REVISON OF ARTERIOVENOUS FISTULA Left 10/28/2015   Procedure: PLICATION OF BRACHIOCEPHALIC FISTULA;  Surgeon: Fransisco Hertz, MD;  Location: [facility] OR;  Service: Vascular;  Laterality: Left;   REVISON OF ARTERIOVENOUS FISTULA Left 08/09/2017    Procedure: REVISION OF ARTERIOVENOUS FISTULA LIGATION OF SIDE BRANCH;  Surgeon: Maeola Harman, MD;  Location: [facility] OR;  Service: Vascular;  Laterality: Left;   TUBAL LIGATION  2004    ROS: Review of Systems Negative except as stated above  PHYSICAL EXAM: BP 97/64 (BP Location: Left Leg, Patient Position: Sitting, Cuff Size: Normal)   Pulse 71   Ht 4\' 11"  (1.499 m)   Wt 109 lb (49.4 kg)   LMP 02/11/2012 Comment: tubal ligation  SpO2 100%  BMI 22.02 kg/m   Physical Exam  General appearance - alert, well appearing, and in no distress Mental status - normal mood, behavior, speech, dress, motor activity, and thought processes Rectal -my CMA Clarissa is present: Patient has about a 3 cm thrombosed hemorrhoid that is tender to touch.      Latest Ref Rng & Units 06/12/2022   11:06 AM 03/09/2022    9:35 AM 11/11/2021    4:48 PM  CMP  Glucose 70 - 99 mg/dL 80  84  75   BUN 6 - 20 mg/dL 28  59  20   Creatinine 0.44 - 1.00 mg/dL 6.96  2.95  2.84   Sodium 135 - 145 mmol/L 137  137  134   Potassium 3.5 - 5.1 mmol/L 4.0  4.3  3.0   Chloride 98 - 111 mmol/L 96  96  91   CO2 20 - 29 mmol/L  23  25   Calcium 8.7 - 10.2 mg/dL  9.8  9.7    Lipid Panel     Component Value Date/Time   CHOL 195 03/12/2021 0954   TRIG 109 03/12/2021 0954   HDL 65 03/12/2021 0954   CHOLHDL 3.0 03/12/2021 0954   CHOLHDL 2.8 Ratio 10/22/2010 2104   VLDL 30 10/22/2010 2104   LDLCALC 111 (H) 03/12/2021 0954    CBC    Component Value Date/Time   WBC 7.5 11/11/2021 1648   RBC 4.31 11/11/2021 1648   HGB 12.2 06/12/2022 1106   HGB 10.1 (L) 03/12/2021 0954   HCT 36.0 06/12/2022 1106   HCT 30.6 (L) 03/12/2021 0954   PLT 174 11/11/2021 1648   PLT 232 03/12/2021 0954   MCV 87.9 11/11/2021 1648   MCV 96 03/12/2021 0954   MCH 30.2 11/11/2021 1648   MCHC 34.3 11/11/2021 1648   RDW 13.7 11/11/2021 1648   RDW 14.3 03/12/2021 0954   LYMPHSABS 1.1 08/17/2021 1243   LYMPHSABS 1.1 03/12/2021 0954    MONOABS 0.3 08/17/2021 1243   EOSABS 0.0 08/17/2021 1243   EOSABS 0.1 03/12/2021 0954   BASOSABS 0.0 08/17/2021 1243   BASOSABS 0.0 03/12/2021 0954    ASSESSMENT AND PLAN:  1. Thrombosed hemorrhoids -Message sent to our referral coordinator to try to get her in with the general surgeon as soon as possible.  She told me that the patient is uninsured.  Referral was sent to [facility] in [place].  Currently reviewing it. -In the meantime, I told patient to purchase Anorectal Cream which is a 5% lidocaine cream and use it on the hemorrhoid twice a day as needed.  Also recommend sitz baths with Epsom salt in the water - Ambulatory referral to General Surgery  Patient was given the opportunity to ask questions.  Patient verbalized understanding of the plan and was able to repeat key elements of the plan.   This documentation was completed using Paediatric nurse.  Any transcriptional errors are unintentional.  Orders Placed This Encounter  Procedures   Ambulatory referral to General Surgery     Requested Prescriptions    No prescriptions requested or ordered in this encounter    No follow-ups on file.  Jonah Blue, MD, FACP

## 2023-08-23 ENCOUNTER — Other Ambulatory Visit: Payer: Self-pay

## 2023-08-23 ENCOUNTER — Encounter: Payer: Self-pay | Admitting: Surgery

## 2023-08-23 ENCOUNTER — Ambulatory Visit (INDEPENDENT_AMBULATORY_CARE_PROVIDER_SITE_OTHER): Payer: Self-pay | Admitting: Surgery

## 2023-08-23 VITALS — BP 122/64 | HR 71 | Temp 98.0°F | Ht 59.0 in | Wt 109.0 lb

## 2023-08-23 DIAGNOSIS — K645 Perianal venous thrombosis: Secondary | ICD-10-CM

## 2023-08-23 MED ORDER — LIDOCAINE 5 % EX OINT
1.0000 | TOPICAL_OINTMENT | CUTANEOUS | 0 refills | Status: DC | PRN
  Filled 2023-08-23: qty 35.44, 30d supply, fill #0

## 2023-08-23 MED ORDER — HYDROCORTISONE (PERIANAL) 2.5 % EX CREA
1.0000 | TOPICAL_CREAM | Freq: Two times a day (BID) | CUTANEOUS | 0 refills | Status: DC
  Filled 2023-08-23: qty 30, 10d supply, fill #0

## 2023-08-23 NOTE — Patient Instructions (Addendum)
We have sent in two prescriptions for you.  Do a sitz bath with Epsom salts 2-3 times a day.  Le hemos [employer].  Haz un bao de asiento con sales de Epsom 2 o 3 veces al Futures trader.  Utilice Miralax para el estreimiento. Tmelo 1-2 veces al da. toma esto [employer]Max.  Utilice toallitas hmedas o toallitas hmedas para bebs que se puedan tirar al inodoro despus de cada evacuacin intestinal.  Follow up here in 1 month.  Seguimiento aqu en 1 mes.  Puede utilizar Benefiber o Metamucil como suplemento de Animas. necesitars tomar [employer].  Hemorroides  Las hemorroides son venas inflamadas que pueden formarse: En el ano (recto). Estas se denominan hemorroides internas. Alrededor de la abertura del ano. Estas se denominan hemorroides externas. La mayora de las hemorroides no causan problemas muy graves. Generalmente mejoran con cambios en el estilo de vida y en lo que se come. Cules son las causas? Tener dificultad para defecar (estreimiento) o heces acuosas (diarrea). Hacer demasiada fuerza al defecar. Embarazo. Tener mucho sobrepeso (obesidad). Permanecer sentado [employer]Ex. Andar en bicicleta por un largo tiempo. Levantar objetos pesados u otras cosas que requieren [employer]. Sexo anal. Cules son los signos o sntomas? Dolor. Picazn o irritacin en el ano. Sangrado proveniente del ano. Prdida de materia fecal. Hinchazn. Uno o ms bultos alrededor de la abertura del ano. Cmo se trata? En la [employer], las hemorroides pueden tratarse en casa. Es posible que le indiquen lo siguiente: Multimedia programmer lo que come. Hacer cambios en su estilo de vida. Si estos tratamientos no resultan eficaces, tal vez deba someterse a un procedimiento. Es posible que el mdico deba hacer lo siguiente: Scientific laboratory technician bandas de goma en la parte inferior de las hemorroides para hacer que se desprendan. Poner un medicamento dentro de las hemorroides para  reducir Brewing technologist. Dirigir un tipo de Sports administrator las hemorroides para hacer que se desprendan. Realizar una ciruga para retirar las hemorroides. Siga estas instrucciones en su casa: Medicamentos Use los medicamentos de venta libre y los recetados solamente como se lo haya indicado el mdico. Use cremas medicadas o medicamentos que se ponen en el ano como se lo haya indicado el mdico. Comida y bebida  Consuma alimentos con alto contenido de [employer]. Entre ellos cereales integrales, frijoles, frutos secos, frutas y verduras. Pregntele a su mdico acerca de tomar productos con fibra aadida (suplementos de [place]). Consuma menos grasa. Para esto, puede hacer lo siguiente: Coma productos lcteos descremados. Coma menos carne roja. Evite los alimentos procesados. Beber suficiente lquido para Radio producer pis (orina) de color amarillo plido. Control del dolor y la hinchazn  Tome un bao de agua tibia (bao de asiento) durante 20 minutos para Engineer, materials. Hgalo 3 o 4 veces al da. Puede hacerlo en una baera. Tambin puede usar un bao de asiento porttil que se pone sobre el inodoro. Si se lo indican, aplique hielo sobre la zona dolorida. Puede ser de ayuda aplicarse hielo [employer] baos con agua tibia. Ponga el hielo en una bolsa plstica. Coloque una toalla entre la piel y Copy. Aplique el hielo durante 20 minutos, 2 a 3 veces por da. Si la piel se le pone de color rojo brillante, quite el hielo de inmediato para evitar daos en la piel. El Dix de dao es mayor si no puede sentir dolor, Airline pilot o fro. Instrucciones generales Actividad fsica. Consulte al mdico qu tipos de ejercicios  son mejores para usted y [place] cantidad. Vaya al bao cuando sienta la necesidad de defecar. No espere. Trate de no hacer mucha fuerza al defecar. Mantenga el ano seco y limpio. Use papel higinico hmedo o toallitas humedecidas despus de defecar. No pase mucho tiempo sentado en el  inodoro. Comunquese con un mdico si: Tiene dolor e hinchazn que no mejoran con el tratamiento. Tiene dificultad para defecar. No puede defecar. Tiene dolor o hinchazn en la zona exterior de las hemorroides. Solicite ayuda de inmediato si: Tiene un sangrado del ao que no se detiene. Esta informacin no tiene Theme park manager el consejo del mdico. Asegrese de hacerle al mdico cualquier pregunta que tenga. Document Revised: 10/01/2022 Document Reviewed: 10/01/2022 Elsevier Patient Education  2024 [employer].  Contenido de fibra de los alimentos QUALCOMM Content in Foods La fibra se Occupational psychologist en los alimentos vegetales, como las frutas, las verduras, los cereales Hale, los frutos secos, las semillas y las legumbres. Si tiene Runner, broadcasting/film/video, es posible que deba seguir una dieta con alto contenido de [place] o una dieta con bajo contenido de [place]. El mdico le dir cunta cantidad de fibra necesita. Si tiene problemas o preguntas, comunquese con su mdico. Qu alimentos son ricos en fibra?  Los alimentos con alto contenido de Research scientist (life sciences) tienen 4 gramos (g) o ms de fibra por porcin. Frutas Arndanos o frambuesas (frescos):  taza (75 g) tiene 4 g de fibra. Pera (fresca): 1 mediana (180 g) tiene 5.5 g de fibra. Ciruelas (secas): 6 a 8 unidades (57 a 76 g) tienen 5 g de fibra. Manzana con piel: 1 mediana (182 g) tiene 4.8 g de fibra. Guayaba: 1 taza (128 g) tiene 8.9 g de fibra. Verduras Guisantes (congelados):  taza (80 g) tiene 4.4 g de fibra. Papa con piel (al horno): 1 mediana (173 g) tiene 4 g de fibra. Calabaza (enlatada):  taza (122 g) tiene 4 g de fibra. Batata:  taza en pur (124 g) tiene 4 g de fibra. Calabaza: 1 taza cocida (205 g) tiene 5.7 g de fibra. Granos Cereal de salvado:  taza (31 g) tiene 8.6 g de fibra. Trigo burgol (cocido):  taza (70 g) tiene 4 g de fibra. Quinua (cocida): 1 taza (185 g) tiene 5.2 g de fibra. Palomitas de maz: 3 tazas (375 g)  de palomitas tienen 5.8 g de fibra. Fideos espagueti de trigo integral: 1 taza (140 g) tiene 6 g de fibra. Avena (cocida): 1 taza (234 g) tiene 4 g de fibra. Carnes y otras protenas Frijoles pintos (cocidos):  taza (90 g) tiene 7.7 g de fibra. Lentejas (cocidas):  taza (90 g) tiene 7.8 g de fibra. Frijoles colorados (en lata):  taza (92.5 g) tiene 5.7 g de fibra. Porotos de soja (en lata, congelados o frescos):  taza (92.5 g) tiene 5.2 g de fibra. Frijoles cocidos, comunes o vegetarianos (en lata),  taza (130 g) tiene 5.2 g de fibra. Garbanzos (enlatados):  taza (90 g) tiene 6.6 g de fibra. Frijoles negros (cocidos):  taza (86 g) tiene 7.5 g de fibra. Frijoles blancos o frijoles azules (cocinados): 1?2 taza (91 g) tiene 9.3 g de fibra. Es posible que los productos enumerados anteriormente no sean una lista completa de los alimentos con alto contenido de [employer]. Las cantidades reales de fibra pueden ser diferentes en funcin del procesamiento. Consulte a un nutricionista para obtener ms informacin. Qu alimentos son moderados en fibra?  Los alimentos con contenido moderado de [place] de 1 a 3  g de fibra por porcin. Frutas Banana: 1 mediana (126 g) tiene 3.2 g de fibra. Meln: 1 taza (155 g) tiene 1.4 g de fibra. Naranja: 1 pequea (154 g) tiene 3.7 g de fibra. Pasas:  taza (40 g) tiene 1.8 g de fibra. Pur de manzana (endulzado):  taza (125 g) tiene 1.5 g de fibra. Arndanos (frescos):  taza (75 g) tiene 1.8 g de fibra. Jinny Sanders (frescas, en rebanadas): 1 taza (150 g) tiene 3 g de fibra. Cerezas: 1 taza (140 g) tiene 2.9 g de fibra. Verduras Brcoli (cocido):  taza (77.5 g) tiene 2.1 g de fibra. Repollitos de Bruselas (cocidos):  taza (78 g) contiene 3 g de fibra. Zanahorias (cocidas):  taza (77.5 g) tiene 2.2 g de fibra. Maz (en lata o congelado):  taza (82.5 g) tiene 2.1 g de fibra. Pur de papas:  taza (105 g) tiene 1.6 g de fibra. Tomate: 1 mediano (62 g)  tiene 1.5 g de fibra. Judas verdes (en lata):  taza (83 g) tiene 2 g de fibra. Batata (al horno): 1 mediana (150 g) tiene 3 g de fibra. Coliflor (cocido):  taza (90 g) tiene 2.3 g de fibra. Granos Arroz integral de Scientist, water quality (cocido): 1 taza (196 g) tiene 3.5 g de fibra. Bagel solo: un bagel de 4 pulgadas (10 cm) tiene 2 g de fibra. Avena instantnea:  taza (120 g) tiene aproximadamente 2 g de fibra. Macarrones enriquecidos (cocidos): 1 taza (140 g) tiene 2.5 g de fibra. Multicereales:  taza (15 g) tiene aproximadamente de 2 a 4 g de fibra. Pan de trigo integral: 1 rebanada (26 g) tiene 2 g de fibra. Fideos espagueti de trigo integral:  taza (70 g) tiene 3.2 g de fibra. Tortilla de maz: una tortilla de 6 pulgadas (15 cm) tiene 1.5 g de fibra. Carnes y otras protenas Almendras:  taza o 1 onza (28 g) tiene 3.5 g de fibra. Semillas de girasol, con cscara:  taza o  onza (11.5 g) tiene 1.1 g de fibra. Hamburguesa de vegetales o de soja: 1 unidad (70 g) tiene 3.4 g de fibra. Nueces:  taza o 1 onza (30 g) tiene 2 g de fibra. Semilla de lino: 1 cucharada (7 g) tiene 2.8 g de fibra. Es posible que los productos enumerados no sean una lista completa de los alimentos que contienen una cantidad [place]. Las cantidades reales de fibra pueden ser diferentes en funcin del procesamiento. Consulte a un nutricionista para obtener ms informacin. Qu alimentos tienen bajo contenido de [place]?  Los alimentos con bajo contenido de fibra tienen menos de 1 g de fibra por porcin. Incluyen los siguientes: Frutas Zumo de frutas: 1?2 taza o 4 onzas (118 ml) tiene 0.5 g de fibra. Verduras Deatra James: 1 taza (35 g) tiene 0.5 g de fibra. Pepino (en rebanadas) -- 1?2 taza (60 g) tiene 0.3 g de fibra. Apio: 1 tallo (40 g) tiene 0.1 g de fibra. Granos Tortilla de maz: una tortilla de 6 pulgadas (15 cm) tiene 0.5 g de fibra. Arroz blanco (cocido):  taza (81.5 g) tiene 0.3 g de fibra. Carnes  y otras protenas Huevo: 1 grande (50 g) tiene 0 g de [place]. Carne, aves de corral o pescado: 3 onzas (85 g) tienen 0 g de fibra. Lcteos Zumo de frutas: 1 taza u 8 onzas (237 ml) tiene 0 g de fibra. Yogur: 1 taza (245 g) tiene 0 g de fibra. Es posible que los productos enumerados anteriormente no sean una lista completa  de los alimentos con bajo contenido de [place]. Las cantidades reales de fibra pueden ser diferentes en funcin del procesamiento. Consulte a un nutricionista para obtener ms informacin. Esta informacin no tiene Theme park manager el consejo del mdico. Asegrese de hacerle al mdico cualquier pregunta que tenga. Document Revised: 04/21/2023 Document Reviewed: 04/21/2023 Elsevier Patient Education  2024 [employer].

## 2023-08-23 NOTE — Progress Notes (Signed)
08/23/2023  Reason for Visit:  Thrombosed external hemorrhoid  Requesting Provider:  Jonah Blue, MD  History of Present Illness: Sabrina Mitchell is a 62 y.o. female presenting for evaluation of thrombosed external hemorrhoid.  The patient reports she's known of mildly enlarged hemorrhoids for about 20 years, but they had not really given her any significant issues until most recently.  About 4 weeks ago, she started having perianal pain, associated with some bleeding with wiping, and pain with bowel movement.  She also had diarrhea after eating spicy foods and feels that this worsened the situation.  She felt the perianal area was more swollen and tender/firm.  She has tried different over the counter medications and this has not helped.  She saw her PCP recently and was started on Anusol suppository and lidocaine 4% ointment.  She reports this has not helped her.  On a follow up visit last week, she felt the pain was worsening and on exam was felt to have thrombosed hemorrhoid.  She was referred for further evaluation/management.  Today, the patient reports that she continues having pain.  She finds it hard/difficult to sit down because of it and uses a doughnut pillow to sit down.  She has dialysis three times a week for a few hours each session and finds it hard to sit on the chair during dialysis sessions.  She feels she's having constipation issues too and although has two bowel movements per day, the stool is hard and she has to strain.  She has tried baths with Epsom salts and this has helped a bit.  Currently she's not had bleeding for the last two days.  Past Medical History: Past Medical History:  Diagnosis Date   Anemia    Anxiety    Complication of anesthesia    Depression    denies   End stage renal disease (HCC)    hemodialysis 3x/wk @ [address]   Family history of adverse reaction to anesthesia    sister vomiting after uterine cyst removal   Gastritis     GERD (gastroesophageal reflux disease)    Hemorrhoids    Hypertension    Pneumonia    years ago   PONV (postoperative nausea and vomiting)    UTI (lower urinary tract infection) March 2014     Past Surgical History: Past Surgical History:  Procedure Laterality Date   A/V FISTULAGRAM Right 06/12/2022   Procedure: A/V Fistulagram;  Surgeon: Chuck Hint, MD;  Location: [facility] INVASIVE CV LAB;  Service: Cardiovascular;  Laterality: Right;   AV FISTULA PLACEMENT Left 07/05/09   Dr. Hart Rochester   AV FISTULA PLACEMENT Right 09/26/2021   Procedure: RIGHT ARM ARTERIOVENOUS (AV) FISTULA CREATION;  Surgeon: Maeola Harman, MD;  Location: [facility] OR;  Service: Vascular;  Laterality: Right;   COLONOSCOPY N/A 02/10/2016   SLF: 1. HEME postive stool due to colon polyps intrernal hemorrhoids 2. small internal hemrrohoids 3. moderate external hemorrhoids.    ESOPHAGEAL DILATION N/A 02/10/2016   Procedure: ESOPHAGEAL DILATION;  Surgeon: West Bali, MD;  [facility] END[facility];  Service: Endoscopy;  Laterality: N/A;   ESOPHAGOGASTRODUODENOSCOPY N/A 02/10/2016   SLF: patent Schatzki's ring , mild gastritis/ duodentitis.    FISTULA SUPERFICIALIZATION Left 11/04/2018   Procedure: PLICATION BRACHIOCEPHALIC FISTULA;  Surgeon: Chuck Hint, MD;  Location: [facility] OR;  Service: Vascular;  Laterality: Left;   FISTULOGRAM N/A 10/22/2014   Procedure: FISTULOGRAM;  Surgeon: Chuck Hint, MD;  Location: [facility] CATH LAB;  Service: Cardiovascular;  Laterality: N/A;   INSERTION OF DIALYSIS CATHETER Right 08/27/2021   Procedure: INSERTION OF 19cm TUNNELED DIALYSIS CATHETER INTO RIGHT NECK;  Surgeon: Cephus Shelling, MD;  Location: [facility] OR;  Service: Vascular;  Laterality: Right;   IR DIALY SHUNT INTRO NEEDLE/INTRACATH INITIAL W/IMG LEFT Left 02/01/2019   IR DIALY SHUNT INTRO NEEDLE/INTRACATH INITIAL W/IMG LEFT Left 06/05/2020   IR DIALY SHUNT INTRO NEEDLE/INTRACATH INITIAL W/IMG LEFT Left 05/09/2021    IR REMOVAL TUN CV CATH W/O FL  12/04/2022   IR THROMBECTOMY AV FISTULA W/THROMBOLYSIS/PTA INC/SHUNT/IMG LEFT Left 08/18/2021   IR US GUIDE VASC ACCESS LEFT  08/18/2021   PERIPHERAL VASCULAR BALLOON ANGIOPLASTY Left 07/14/2021   Procedure: PERIPHERAL VASCULAR BALLOON ANGIOPLASTY;  Surgeon: Maeola Harman, MD;  Location: [facility] INVASIVE CV LAB;  Service: Cardiovascular;  Laterality: Left;   PERIPHERAL VASCULAR CATHETERIZATION Left 12/09/2016   Procedure: A/V Fistulagram;  Surgeon: Maeola Harman, MD;  Location: [facility] INVASIVE CV LAB;  Service: Cardiovascular;  Laterality: Left;   REVISON OF ARTERIOVENOUS FISTULA Left 10/28/2015   Procedure: PLICATION OF BRACHIOCEPHALIC FISTULA;  Surgeon: Fransisco Hertz, MD;  Location: [facility] OR;  Service: Vascular;  Laterality: Left;   REVISON OF ARTERIOVENOUS FISTULA Left 08/09/2017   Procedure: REVISION OF ARTERIOVENOUS FISTULA LIGATION OF SIDE BRANCH;  Surgeon: Maeola Harman, MD;  Location: [facility] OR;  Service: Vascular;  Laterality: Left;   TUBAL LIGATION  2004    Home Medications: Prior to Admission medications   Medication Sig Start Date End Date Taking? Authorizing Provider  acetaminophen (TYLENOL) 500 MG tablet Take 500-1,000 mg by mouth every 6 (six) hours as needed for moderate pain or headache.   Yes [provider]  amLODipine (NORVASC) 5 MG tablet Take 1 tablet (5 mg total) by mouth 2 (two) times daily for blood pressure. 04/23/23  Yes Marcine Matar, MD  B Complex-C-Folic Acid (DIALYVITE 800) 0.8 MG TABS Take 1 tablet by mouth daily at 2 PM. 09/06/19  Yes [provider]  calcium carbonate (TUMS - DOSED IN MG ELEMENTAL CALCIUM) 500 MG chewable tablet Chew 1-2 tablets by mouth 3 (three) times daily as needed for indigestion or heartburn.   Yes [provider]  carvedilol (COREG) 12.5 MG tablet Take 1 tablet (12.5 mg total) by mouth 2 (two) times daily for blood pressure. 01/26/23  Yes   cinacalcet (SENSIPAR) 60  MG tablet Take 60 mg by mouth daily. 08/13/22  Yes [provider]  ethyl chloride spray Spray 1 as directed to skin three times a week as directed apply a small amount of spray to dialysis access just before needle stick 06/24/23  Yes   ferric citrate (AURYXIA) 1 GM 210 MG(Fe) tablet Take 420-630 mg by mouth See admin instructions. Take 2-3 tablets (420 - 630 mg) by mouth with each meal or snack   Yes [provider]  hydrocortisone (ANUSOL-HC) 2.5 % rectal cream Place 1 Application rectally 2 (two) times daily. 08/23/23  Yes Odis Turck, Elita Quick, MD  hydrocortisone (ANUSOL-HC) 25 MG suppository Place 1 suppository (25 mg total) rectally 2 (two) times daily as needed for hemorrhoids or anal itching. 08/09/23  Yes Marcine Matar, MD  lidocaine (XYLOCAINE) 5 % ointment Apply 1 Application topically as needed. 08/23/23  Yes Ulla Mckiernan, Elita Quick, MD  nystatin-triamcinolone ointment (MYCOLOG) Apply 1 application topically 2 (two) times daily. 08/09/23  Yes Marcine Matar, MD  Omega-3 Fatty Acids (OMEGA 3 500) 500 MG CAPS Take 500 mg by mouth every other  day.   Yes [provider]  omeprazole (PRILOSEC) 20 MG capsule Take 1 capsule (20 mg total) by mouth 2 (two) times daily before a meal. 1 PO 30 mins prior to breakfast and supper 12/25/22  Yes Marcine Matar, MD  zolpidem (AMBIEN) 10 MG tablet Take 1 tablet (10 mg total) by mouth at bedtime as needed for sleep. 07/30/23  Yes Marcine Matar, MD    Allergies: Allergies  Allergen Reactions   Covid-19 (Mrna) Vaccine Swelling    Tongue swelling   Morphine And Codeine Nausea And Vomiting   Remeron [Mirtazapine] Other (See Comments)    Chest pain   Zoloft [Sertraline] Other (See Comments)    Chest pain and chills   Elavil [Amitriptyline] Other (See Comments)    Makes her shake   Trazodone And Nefazodone Other (See Comments)    Makes her shake    Social History:  reports that she has never smoked. She has never used smokeless  tobacco. She reports that she does not drink alcohol and does not use drugs.   Family History: Family History  Problem Relation Age of Onset   Hypertension Mother    Hypertension Father    Hypertension Sister    Breast cancer Sister 31   Diabetes Brother    Hypertension Brother     Review of Systems: Review of Systems  Constitutional:  Negative for chills and fever.  Respiratory:  Negative for shortness of breath.   Cardiovascular:  Negative for chest pain.  Gastrointestinal:  Positive for blood in stool and constipation. Negative for abdominal pain, nausea and vomiting.  Genitourinary:  Negative for dysuria.    Physical Exam BP 122/64   Pulse 71   Temp 98 F (36.7 C)   Ht 4\' 11"  (1.499 m)   Wt 109 lb (49.4 kg)   LMP 02/11/2012 Comment: tubal ligation  SpO2 98%   BMI 22.02 kg/m  CONSTITUTIONAL: No acute distress HEENT:  Normocephalic, atraumatic, extraocular motion intact.  RESPIRATORY:  Normal respiratory effort without pathologic use of accessory muscles. CARDIOVASCULAR: Regular rhythm and rate RECTAL:  External exam reveals inflamed right anterior hemorrhoid with evidence of recent thrombosis.  Inflammation is improving, but the patient is very tender to palpation.  Some enlargement of the right posterior external component, no enlargement of the left lateral portion.  Unable to do DRE due to pain. MUSCULOSKELETAL:  Normal muscle strength and tone in all four extremities.  No peripheral edema or cyanosis. NEUROLOGIC:  Motor and sensation is grossly normal.  Cranial nerves are grossly intact. PSYCH:  Alert and oriented to person, place and time. Affect is normal.  Laboratory Analysis: None recent   Assessment and Plan: This is a 61 y.o. female with recently thrombosed external hemorrhoid  --Discussed with the patient the findings on partial exam today.  Do not see any fissures, but she does have enlargement particularly of the right anterior hemorrhoid and this is  tender to palpation.  No current bleeding, but also unable to do a digital rectal exam due to pain. --For now, discussed that we can treat her for inflamed/thrombosed external hemorrhoid.  It has likely been more than 72 hrs since the hemorrhoid was thrombosed, as the tissue does feel softer today.  No indication at this point for I&D and clot evacuation.  Will prescribe Ansusol 2.5% ointment that she can apply directly to the hemorrhoid tissue in hope that this can help her more directly with the pain/inflammation, and will give her lidocaine  5% ointment as well.  Discussed with her doing Sitz baths after each bowel movement, adding fiber/miralax to her bowel regimen to help keep the stool softer and decrease the constipation.   --She will follow up with me in a month to evaluate her progress.  Discussed that for now there is no urgent need for surgery, and would defer until the next appointment to make any recommendations.  Discussed that hemorrhoid surgery can be painful and would want to make sure this is truly needed.  I spent 30 minutes dedicated to the care of this patient on the date of this encounter to include pre-visit review of records, face-to-face time with the patient discussing diagnosis and management, and any post-visit coordination of care.   Howie Ill, MD [facility]

## 2023-08-24 ENCOUNTER — Other Ambulatory Visit: Payer: Self-pay

## 2023-09-01 ENCOUNTER — Other Ambulatory Visit: Payer: Self-pay

## 2023-09-06 ENCOUNTER — Other Ambulatory Visit: Payer: Self-pay

## 2023-09-06 ENCOUNTER — Ambulatory Visit: Payer: Self-pay | Attending: Nurse Practitioner | Admitting: Nurse Practitioner

## 2023-09-06 ENCOUNTER — Encounter: Payer: Self-pay | Admitting: Nurse Practitioner

## 2023-09-06 VITALS — BP 128/78 | HR 64 | Ht 59.0 in | Wt 111.2 lb

## 2023-09-06 DIAGNOSIS — R1033 Periumbilical pain: Secondary | ICD-10-CM

## 2023-09-06 DIAGNOSIS — K219 Gastro-esophageal reflux disease without esophagitis: Secondary | ICD-10-CM

## 2023-09-06 MED ORDER — DICYCLOMINE HCL 10 MG PO CAPS
10.0000 mg | ORAL_CAPSULE | Freq: Three times a day (TID) | ORAL | 1 refills | Status: AC
Start: 2023-09-06 — End: ?
  Filled 2023-09-06: qty 60, 15d supply, fill #0

## 2023-09-06 MED ORDER — OMEPRAZOLE 20 MG PO CPDR
20.0000 mg | DELAYED_RELEASE_CAPSULE | Freq: Two times a day (BID) | ORAL | 6 refills | Status: DC
  Filled 2023-09-06: qty 60, 30d supply, fill #0
  Filled 2024-01-14: qty 60, 30d supply, fill #1
  Filled 2024-03-22: qty 60, 30d supply, fill #2
  Filled 2024-05-10: qty 60, 30d supply, fill #3
  Filled 2024-06-16 – 2024-07-24 (×2): qty 60, 30d supply, fill #4

## 2023-09-06 NOTE — Progress Notes (Signed)
Assessment & Plan:  Sabrina Mitchell was seen today for abdominal pain and navel burning.  Diagnoses and all orders for this visit:  Periumbilical abdominal pain -     H. pylori breath test; Future -     dicyclomine (BENTYL) 10 MG capsule; Take 1 capsule (10 mg total) by mouth 4 (four) times daily -  before meals and at bedtime. For stomach pain  Gastroesophageal reflux disease without esophagitis -     omeprazole (PRILOSEC) 20 MG capsule; Take 1 capsule (20 mg total) by mouth 2 (two) times daily before a meal.(30 mins prior to breakfast and supper)    Patient has been counseled on age-appropriate routine health concerns for screening and prevention. These are reviewed and up-to-date. Referrals have been placed accordingly. Immunizations are up-to-date or declined.    Subjective:   Chief Complaint  Patient presents with   Abdominal Pain   Navel burning   HPI Sabrina Mitchell 62 y.o. female presents to office today for abdominal pain.  She is a patient of Dr. Laural Mitchell is here for an acute visit today.  VRI was used to communicate directly with patient for the entire encounter including providing detailed patient instructions.    She endorses pain behind the belly button. Onset 2-3 weeks ago.  Pain is described as stabbing and burning: 9/10. Comes and goes and unrelated to eating. Stopped eating spicy foods and beef with no improvement in symptoms.  Denies nausea vomiting or diarrhea.  She is having 2-3 normal bowel movements per day and denies any constipation.  She ran out of pantoprazole which she was prescribed for GERD and did not return to the pharmacy to pick this up a few months ago.    Review of Systems  Constitutional:  Negative for fever, malaise/fatigue and weight loss.  HENT: Negative.  Negative for nosebleeds.   Eyes: Negative.  Negative for blurred vision, double vision and photophobia.  Respiratory: Negative.  Negative for cough and shortness of breath.    Cardiovascular: Negative.  Negative for chest pain, palpitations and leg swelling.  Gastrointestinal:  Positive for abdominal pain. Negative for blood in stool, constipation, diarrhea, heartburn, melena, nausea and vomiting.  Musculoskeletal: Negative.  Negative for myalgias.  Neurological: Negative.  Negative for dizziness, focal weakness, seizures and headaches.  Psychiatric/Behavioral: Negative.  Negative for suicidal ideas.     Past Medical History:  Diagnosis Date   Anemia    Anxiety    Complication of anesthesia    Depression    denies   End stage renal disease (HCC)    hemodialysis 3x/wk @ [address]   Family history of adverse reaction to anesthesia    sister vomiting after uterine cyst removal   Gastritis    GERD (gastroesophageal reflux disease)    Hemorrhoids    Hypertension    Pneumonia    years ago   PONV (postoperative nausea and vomiting)    UTI (lower urinary tract infection) March 2014    Past Surgical History:  Procedure Laterality Date   A/V FISTULAGRAM Right 06/12/2022   Procedure: A/V Fistulagram;  Surgeon: Chuck Hint, MD;  Location: [facility] INVASIVE CV LAB;  Service: Cardiovascular;  Laterality: Right;   AV FISTULA PLACEMENT Left 07/05/09   Dr. Hart Rochester   AV FISTULA PLACEMENT Right 09/26/2021   Procedure: RIGHT ARM ARTERIOVENOUS (AV) FISTULA CREATION;  Surgeon: Maeola Harman, MD;  Location: [facility] OR;  Service: Vascular;  Laterality: Right;   COLONOSCOPY N/A 02/10/2016   SLF:  1. HEME postive stool due to colon polyps intrernal hemorrhoids 2. small internal hemrrohoids 3. moderate external hemorrhoids.    ESOPHAGEAL DILATION N/A 02/10/2016   Procedure: ESOPHAGEAL DILATION;  Surgeon: West Bali, MD;  [facility]: [facility];  Service: Endoscopy;  Laterality: N/A;   ESOPHAGOGASTRODUODENOSCOPY N/A 02/10/2016   SLF: patent Schatzki's ring , mild gastritis/ duodentitis.    FISTULA SUPERFICIALIZATION Left 11/04/2018   Procedure: PLICATION  BRACHIOCEPHALIC FISTULA;  Surgeon: Chuck Hint, MD;  Location: [facility] OR;  Service: Vascular;  Laterality: Left;   FISTULOGRAM N/A 10/22/2014   Procedure: FISTULOGRAM;  Surgeon: Chuck Hint, MD;  Location: [facility] CATH LAB;  Service: Cardiovascular;  Laterality: N/A;   INSERTION OF DIALYSIS CATHETER Right 08/27/2021   Procedure: INSERTION OF 19cm TUNNELED DIALYSIS CATHETER INTO RIGHT NECK;  Surgeon: Cephus Shelling, MD;  Location: [facility] OR;  Service: Vascular;  Laterality: Right;   IR DIALY SHUNT INTRO NEEDLE/INTRACATH INITIAL W/IMG LEFT Left 02/01/2019   IR DIALY SHUNT INTRO NEEDLE/INTRACATH INITIAL W/IMG LEFT Left 06/05/2020   IR DIALY SHUNT INTRO NEEDLE/INTRACATH INITIAL W/IMG LEFT Left 05/09/2021   IR REMOVAL TUN CV CATH W/O FL  12/04/2022   IR THROMBECTOMY AV FISTULA W/THROMBOLYSIS/PTA INC/SHUNT/IMG LEFT Left 08/18/2021   IR US GUIDE VASC ACCESS LEFT  08/18/2021   PERIPHERAL VASCULAR BALLOON ANGIOPLASTY Left 07/14/2021   Procedure: PERIPHERAL VASCULAR BALLOON ANGIOPLASTY;  Surgeon: Maeola Harman, MD;  Location: [facility] INVASIVE CV LAB;  Service: Cardiovascular;  Laterality: Left;   PERIPHERAL VASCULAR CATHETERIZATION Left 12/09/2016   Procedure: A/V Fistulagram;  Surgeon: Maeola Harman, MD;  Location: [facility] INVASIVE CV LAB;  Service: Cardiovascular;  Laterality: Left;   REVISON OF ARTERIOVENOUS FISTULA Left 10/28/2015   Procedure: PLICATION OF BRACHIOCEPHALIC FISTULA;  Surgeon: Fransisco Hertz, MD;  Location: [facility] OR;  Service: Vascular;  Laterality: Left;   REVISON OF ARTERIOVENOUS FISTULA Left 08/09/2017   Procedure: REVISION OF ARTERIOVENOUS FISTULA LIGATION OF SIDE BRANCH;  Surgeon: Maeola Harman, MD;  Location: [facility] OR;  Service: Vascular;  Laterality: Left;   TUBAL LIGATION  2004    Family History  Problem Relation Age of Onset   Hypertension Mother    Hypertension Father    Hypertension Sister    Breast cancer Sister 55   Diabetes Brother     Hypertension Brother     Social History Reviewed with no changes to be made today.   Outpatient Medications Prior to Visit  Medication Sig Dispense Refill   acetaminophen (TYLENOL) 500 MG tablet Take 500-1,000 mg by mouth every 6 (six) hours as needed for moderate pain or headache.     amLODipine (NORVASC) 5 MG tablet Take 1 tablet (5 mg total) by mouth 2 (two) times daily for blood pressure. 180 tablet 3   calcium carbonate (TUMS - DOSED IN MG ELEMENTAL CALCIUM) 500 MG chewable tablet Chew 1-2 tablets by mouth 3 (three) times daily as needed for indigestion or heartburn.     cinacalcet (SENSIPAR) 60 MG tablet Take 60 mg by mouth daily.     ethyl chloride spray Spray 1 as directed to skin three times a week as directed apply a small amount of spray to dialysis access just before needle stick 116 mL 3   ferric citrate (AURYXIA) 1 GM 210 MG(Fe) tablet Take 420-630 mg by mouth See admin instructions. Take 2-3 tablets (420 - 630 mg) by mouth with each meal or snack     Omega-3 Fatty Acids (OMEGA 3 500) 500 MG CAPS  Take 500 mg by mouth every other day.     zolpidem (AMBIEN) 10 MG tablet Take 1 tablet (10 mg total) by mouth at bedtime as needed for sleep. 30 tablet 1   omeprazole (PRILOSEC) 20 MG capsule Take 1 capsule (20 mg total) by mouth 2 (two) times daily before a meal. 1 PO 30 mins prior to breakfast and supper 60 capsule 3   B Complex-C-Folic Acid (DIALYVITE 800) 0.8 MG TABS Take 1 tablet by mouth daily at 2 PM. (Patient not taking: Reported on 09/06/2023)     carvedilol (COREG) 12.5 MG tablet Take 1 tablet (12.5 mg total) by mouth 2 (two) times daily for blood pressure. (Patient not taking: Reported on 09/06/2023) 180 tablet 3   hydrocortisone (ANUSOL-HC) 2.5 % rectal cream Place 1 Application rectally 2 (two) times daily. (Patient not taking: Reported on 09/06/2023) 30 g 0   hydrocortisone (ANUSOL-HC) 25 MG suppository Place 1 suppository (25 mg total) rectally 2 (two) times daily as needed for  hemorrhoids or anal itching. (Patient not taking: Reported on 09/06/2023) 12 suppository 0   lidocaine (XYLOCAINE) 5 % ointment Apply 1 Application topically as needed. (Patient not taking: Reported on 09/06/2023) 35.44 g 0   nystatin-triamcinolone ointment (MYCOLOG) Apply 1 application topically 2 (two) times daily. (Patient not taking: Reported on 09/06/2023) 30 g 0   Facility-Administered Medications Prior to Visit  Medication Dose Route Frequency Provider Last Rate Last Admin   0.9 %  sodium chloride infusion  250 mL Intravenous PRN Victorino Sparrow, MD       sodium chloride flush (NS) 0.9 % injection 3 mL  3 mL Intravenous Q12H Victorino Sparrow, MD        Allergies  Allergen Reactions   Covid-19 (Mrna) Vaccine Swelling    Tongue swelling   Morphine And Codeine Nausea And Vomiting   Remeron [Mirtazapine] Other (See Comments)    Chest pain   Zoloft [Sertraline] Other (See Comments)    Chest pain and chills   Elavil [Amitriptyline] Other (See Comments)    Makes her shake   Trazodone And Nefazodone Other (See Comments)    Makes her shake       Objective:    BP 128/78 (BP Location: Left Leg, Patient Position: Sitting, Cuff Size: Normal) Comment (Patient Position): leg elevated on rolling stool  Pulse 64   Ht 4\' 11"  (1.499 m)   Wt 111 lb 3.2 oz (50.4 kg)   LMP 02/11/2012 Comment: tubal ligation  SpO2 100%   BMI 22.46 kg/m  Wt Readings from Last 3 Encounters:  09/06/23 111 lb 3.2 oz (50.4 kg)  08/23/23 109 lb (49.4 kg)  08/20/23 109 lb (49.4 kg)    Physical Exam Vitals and nursing note reviewed.  Constitutional:      Appearance: She is well-developed.  HENT:     Head: Normocephalic and atraumatic.  Cardiovascular:     Rate and Rhythm: Normal rate and regular rhythm.     Heart sounds: Normal heart sounds. No murmur heard.    No friction rub. No gallop.  Pulmonary:     Effort: Pulmonary effort is normal. No tachypnea or respiratory distress.     Breath sounds: Normal  breath sounds. No decreased breath sounds, wheezing, rhonchi or rales.  Chest:     Chest wall: No tenderness.  Abdominal:     General: Bowel sounds are normal.     Palpations: Abdomen is soft.     Tenderness: There is abdominal tenderness in  the periumbilical area.     Comments: The area around the umbilicus is red and she reports she has been putting a topical cream on her skin but cannot recall the name of the topical medication  Musculoskeletal:        General: Normal range of motion.     Cervical back: Normal range of motion.  Skin:    General: Skin is warm and dry.  Neurological:     Mental Status: She is alert and oriented to person, place, and time.     Coordination: Coordination normal.  Psychiatric:        Behavior: Behavior normal. Behavior is cooperative.        Thought Content: Thought content normal.        Judgment: Judgment normal.          Patient has been counseled extensively about nutrition and exercise as well as the importance of adherence with medications and regular follow-up. The patient was given clear instructions to go to ER or return to medical center if symptoms don't improve, worsen or new problems develop. The patient verbalized understanding.   Follow-up: Return if symptoms worsen or fail to improve.   Claiborne Rigg, FNP-BC [facility] and [facility] [place], [place] [phone]   09/06/2023, 1:32 PM

## 2023-09-08 ENCOUNTER — Ambulatory Visit: Payer: Self-pay | Attending: Internal Medicine

## 2023-09-08 DIAGNOSIS — R1033 Periumbilical pain: Secondary | ICD-10-CM

## 2023-09-10 LAB — H. PYLORI BREATH TEST: H pylori Breath Test: NEGATIVE

## 2023-09-21 ENCOUNTER — Other Ambulatory Visit: Payer: Self-pay

## 2023-09-27 ENCOUNTER — Ambulatory Visit: Payer: Self-pay | Admitting: Surgery

## 2023-10-06 ENCOUNTER — Other Ambulatory Visit: Payer: Self-pay

## 2023-10-29 ENCOUNTER — Ambulatory Visit: Payer: Self-pay | Admitting: Internal Medicine

## 2023-11-05 ENCOUNTER — Telehealth: Payer: Self-pay

## 2023-11-05 DIAGNOSIS — F5104 Psychophysiologic insomnia: Secondary | ICD-10-CM

## 2023-11-05 MED ORDER — ZOLPIDEM TARTRATE 10 MG PO TABS: 10.0000 mg | ORAL_TABLET | Freq: Every evening | ORAL | 1 refills | Status: DC | PRN

## 2023-11-05 NOTE — Addendum Note (Signed)
Addended by: Jonah Blue B on: 11/05/2023 05:26 PM   Modules accepted: Orders

## 2023-11-05 NOTE — Telephone Encounter (Addendum)
Patient came in requesting refills on Zolpidem preferred pharmacy is [facility] on [address]

## 2023-11-11 ENCOUNTER — Other Ambulatory Visit: Payer: Self-pay

## 2023-11-11 MED ORDER — DICLOFENAC SODIUM 1 % EX GEL
CUTANEOUS | 3 refills | Status: DC
  Filled 2023-11-15: qty 100, 25d supply, fill #0

## 2023-11-15 ENCOUNTER — Other Ambulatory Visit: Payer: Self-pay | Admitting: Internal Medicine

## 2023-11-15 ENCOUNTER — Other Ambulatory Visit: Payer: Self-pay

## 2023-11-15 ENCOUNTER — Telehealth: Payer: Self-pay

## 2023-11-15 DIAGNOSIS — R21 Rash and other nonspecific skin eruption: Secondary | ICD-10-CM

## 2023-11-15 NOTE — Telephone Encounter (Signed)
Patient came in requesting refills on Nystain and Triamcinolone Acetonide Ointment and Lidocaine Ointment 5 %, requesting call back if needed

## 2023-11-16 ENCOUNTER — Other Ambulatory Visit: Payer: Self-pay

## 2023-11-16 MED ORDER — LIDOCAINE 5 % EX OINT
1.0000 | TOPICAL_OINTMENT | CUTANEOUS | 0 refills | Status: DC | PRN
  Filled 2023-11-16: qty 35.44, 30d supply, fill #0

## 2023-11-16 MED ORDER — NYSTATIN-TRIAMCINOLONE 100000-0.1 UNIT/GM-% EX OINT
1.0000 | TOPICAL_OINTMENT | Freq: Two times a day (BID) | CUTANEOUS | 0 refills | Status: DC
  Filled 2023-11-16: qty 30, 15d supply, fill #0

## 2023-11-16 NOTE — Addendum Note (Signed)
Addended by: Jonah Blue B on: 11/16/2023 01:58 PM   Modules accepted: Orders

## 2023-11-16 NOTE — Telephone Encounter (Signed)
Called & spoke to the patient. Verified name & DOB. Informed that refill has been sent to the pharmacy. Patient expressed verbal understanding.

## 2023-11-16 NOTE — Telephone Encounter (Signed)
Refill sent.

## 2023-11-17 ENCOUNTER — Other Ambulatory Visit: Payer: Self-pay

## 2023-12-03 ENCOUNTER — Other Ambulatory Visit: Payer: Self-pay

## 2023-12-03 ENCOUNTER — Other Ambulatory Visit: Payer: Self-pay | Admitting: Internal Medicine

## 2023-12-06 ENCOUNTER — Other Ambulatory Visit: Payer: Self-pay

## 2023-12-06 NOTE — Telephone Encounter (Signed)
Requested medication (s) are due for refill today: yes  Requested medication (s) are on the active medication list: yes  Last refill:  06/24/23 116 ml 3 RF  Future visit scheduled: yes  Notes to clinic:  med not assigned to a protocol   Requested Prescriptions  Pending Prescriptions Disp Refills   ethyl chloride spray 116 mL 3    Sig: Spray 1 as directed to skin three times a week as directed apply a small amount of spray to dialysis access just before needle stick     Off-Protocol Failed - 12/03/2023  4:13 PM      Failed - Medication not assigned to a protocol, review manually.      Passed - Valid encounter within last 12 months    Recent Outpatient Visits           3 months ago Periumbilical abdominal pain   [facility] Dept Of [place]. [facility] Claiborne Rigg, NP   3 months ago Thrombosed hemorrhoids   [place] [facility] - A Dept Of [place]. [facility] Marcine Matar, MD   3 months ago Hemorrhoids, unspecified hemorrhoid type   [place] [facility] - A Dept Of [place]. [facility] Marcine Matar, MD   5 months ago Pap smear for cervical cancer screening   [facility] - A Dept Of [place]. [facility] Marcine Matar, MD   7 months ago ESRD on dialysis [facility])   [facility] - A Dept Of [place]. [facility] Marcine Matar, MD       Future Appointments             In 1 month Laural Benes Binnie Rail, MD [facility] - A Dept Of [place]a Bridegroom. [facility]   In 4 months Deveshwar, Janalyn Rouse, MD [facility] - A Dept Of [place]. [facility]

## 2023-12-08 ENCOUNTER — Ambulatory Visit: Payer: Self-pay | Admitting: Physician Assistant

## 2023-12-09 ENCOUNTER — Other Ambulatory Visit: Payer: Self-pay

## 2023-12-09 MED ORDER — ETHYL CHLORIDE EX AERO
INHALATION_SPRAY | CUTANEOUS | 11 refills | Status: AC
  Filled 2023-12-09: qty 116, 30d supply, fill #0
  Filled 2024-02-04: qty 116, 30d supply, fill #1
  Filled 2024-05-01: qty 116, 30d supply, fill #2
  Filled 2024-06-16: qty 116, 30d supply, fill #3

## 2023-12-13 ENCOUNTER — Other Ambulatory Visit: Payer: Self-pay

## 2024-01-14 ENCOUNTER — Other Ambulatory Visit: Payer: Self-pay

## 2024-01-14 ENCOUNTER — Ambulatory Visit: Payer: Self-pay | Attending: Internal Medicine | Admitting: Internal Medicine

## 2024-01-14 ENCOUNTER — Encounter: Payer: Self-pay | Admitting: Internal Medicine

## 2024-01-14 VITALS — BP 101/63 | HR 77 | Temp 98.6°F | Wt 108.0 lb

## 2024-01-14 DIAGNOSIS — R0989 Other specified symptoms and signs involving the circulatory and respiratory systems: Secondary | ICD-10-CM

## 2024-01-14 DIAGNOSIS — K6289 Other specified diseases of anus and rectum: Secondary | ICD-10-CM

## 2024-01-14 DIAGNOSIS — D631 Anemia in chronic kidney disease: Secondary | ICD-10-CM

## 2024-01-14 DIAGNOSIS — I12 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 disease: Secondary | ICD-10-CM

## 2024-01-14 DIAGNOSIS — N186 End stage renal disease: Secondary | ICD-10-CM

## 2024-01-14 DIAGNOSIS — R1033 Periumbilical pain: Secondary | ICD-10-CM

## 2024-01-14 DIAGNOSIS — Z992 Dependence on renal dialysis: Secondary | ICD-10-CM

## 2024-01-14 MED ORDER — NYSTATIN-TRIAMCINOLONE 100000-0.1 UNIT/GM-% EX OINT
1.0000 | TOPICAL_OINTMENT | Freq: Two times a day (BID) | CUTANEOUS | 0 refills | Status: DC
  Filled 2024-01-14: qty 30, 15d supply, fill #0

## 2024-01-14 MED ORDER — LIDOCAINE 5 % EX OINT
1.0000 | TOPICAL_OINTMENT | CUTANEOUS | 0 refills | Status: DC | PRN
  Filled 2024-01-14: qty 35.44, 30d supply, fill #0

## 2024-01-14 NOTE — Progress Notes (Signed)
Patient ID: Sabrina Mitchell, female    DOB: [birth year]  MRN: [id number]  CC: Hypertension (HTN f/u./Intermittent pain on abdominal pain/Requesting blood work Dallie Piles received flu vax.)   Subjective: Sabrina Mitchell is a 63 y.o. female who presents for chronic ds management. Her concerns today include:  ESRD on HD, HTN, ACD, GERD, vit D def, external hemorrhoids and insomnia.   AMN Language interpreter used during this encounter. #[email or web address] [id number]  Discussed the use of AI scribe software for clinical note transcription with the patient, who gave verbal consent to proceed.  History of Present Illness   The patient, with a history of end stage kidney disease on hemodialysis three times weekly, hypertension, and anemia, presents with intermittent abdominal pain for the past three weeks. The pain is located in the mid abdomen, below the umbilicus, and is described as 'hard' and 'strong.' It occurs one to three times daily, lasting for seconds to a minute. The pain is not associated with food intake and is not relieved or exacerbated by any specific factors.  She has not had any fever.  She does not produce much urine.  Denies any changes in bowel habits or severe constipation.  No abnormal vaginal discharge. Tylenol helps but she really takes the Tylenol for other issues like when she has pain in her knees or head.  Takes Tylenol only occasionally.  The patient's hypertension is managed with amlodipine 5mg  daily and carvedilol 12.5 mg BID. She receives iron infusion or ? Aranesp inj during dialysis as needed. She also takes omeprazole for GERD and finds it helpful. Request RF on ointment and cream that she uses as needed for rectal itching and for when she has flare of hemorrhoids.   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/2015   Dependence on renal dialysis (HCC) 10/15/2015   Pruritus, unspecified 12/26/2014   Diarrhea, unspecified 09/13/2014   Fever, unspecified 08/30/2014   Pain, unspecified 08/30/2014   Generalized abdominal pain 04/05/2013   Post-menopausal bleeding 04/06/2012   End stage renal disease on dialysis (HCC) 04/06/2012   Hypertension 04/06/2012   Anemia in chronic kidney disease 03/10/2012   Secondary hyperparathyroidism of renal origin (HCC) 03/10/2012   Iron deficiency anemia, unspecified 01/23/2012   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     amLODipine (NORVASC) 5 MG tablet Take 1 tablet (5 mg total) by mouth 2 (two) times daily for blood pressure. 180 tablet 3   B Complex-C-Folic Acid (DIALYVITE 800) 0.8 MG TABS Take 1 tablet by mouth daily at 2 PM.     calcium carbonate (TUMS - DOSED IN MG ELEMENTAL CALCIUM) 500 MG chewable tablet Chew 1-2 tablets by mouth 3 (three) times daily as needed for indigestion or heartburn.     carvedilol (COREG) 12.5  MG tablet Take 1 tablet (12.5 mg total) by mouth 2 (two) times daily for blood pressure. 180 tablet 3   cinacalcet (SENSIPAR) 60 MG tablet Take 60 mg by mouth daily.     diclofenac Sodium (VOLTAREN) 1 % GEL Apply 1 a small amount to affected area twice a day as needed for pain 100 g 3   dicyclomine (BENTYL) 10 MG capsule Take 1 capsule (10 mg total) by mouth 4  (four) times daily -  before meals and at bedtime. For stomach pain 60 capsule 1   ethyl chloride spray Spray 1 as directed to skin three times a week as directed apply a small amount of spray to dialysis access just before needle stick 116 mL 11   ferric citrate (AURYXIA) 1 GM 210 MG(Fe) tablet Take 420-630 mg by mouth See admin instructions. Take 2-3 tablets (420 - 630 mg) by mouth with each meal or snack     hydrocortisone (ANUSOL-HC) 2.5 % rectal cream Place 1 Application rectally 2 (two) times daily. 30 g 0   hydrocortisone (ANUSOL-HC) 25 MG suppository Place 1 suppository (25 mg total) rectally 2 (two) times daily as needed for hemorrhoids or anal itching. 12 suppository 0   Omega-3 Fatty Acids (OMEGA 3 500) 500 MG CAPS Take 500 mg by mouth every other day.     omeprazole (PRILOSEC) 20 MG capsule Take 1 capsule (20 mg total) by mouth 2 (two) times daily before a meal.(30 mins prior to breakfast and supper) 60 capsule 6   zolpidem (AMBIEN) 10 MG tablet Take 1 tablet (10 mg total) by mouth at bedtime as needed for sleep. 30 tablet 1   Current Facility-Administered Medications on File Prior to Visit  Medication Dose Route Frequency Provider Last Rate Last Admin   0.9 %  sodium chloride infusion  250 mL Intravenous PRN Victorino Sparrow, MD       sodium chloride flush (NS) 0.9 % injection 3 mL  3 mL Intravenous Q12H Victorino Sparrow, MD        Allergies  Allergen Reactions   Covid-19 (Mrna) Vaccine Swelling    Tongue swelling   Morphine And Codeine Nausea And Vomiting   Remeron [Mirtazapine] Other (See Comments)    Chest pain   Zoloft [Sertraline] Other (See Comments)    Chest pain and chills   Elavil [Amitriptyline] Other (See Comments)    Makes her shake   Trazodone And Nefazodone Other (See Comments)    Makes her shake    Social History   Socioeconomic History   Marital status: Single    Spouse name: Not on file   Number of children: 2   Years of education: Not on file    Highest education level: Not on file  Occupational History   Not on file  Tobacco Use   Smoking status: Never   Smokeless tobacco: Never  Vaping Use   Vaping status: Never Used  Substance and Sexual Activity   Alcohol use: No   Drug use: No   Sexual activity: Not Currently    Birth control/protection: Surgical  Other Topics Concern   Not on file  Social History Narrative   Not on file   Social Drivers of Health   Financial Resource Strain: High Risk (01/14/2024)   Overall Financial Resource Strain (CARDIA)    Difficulty of Paying Living Expenses: Very hard  Food Insecurity: Food Insecurity Present (01/14/2024)   Hunger Vital Sign    Worried About Radiation protection practitioner of Food  in the Last Year: Sometimes true    Ran Out of Food in the Last Year: Sometimes true  Transportation Needs: No Transportation Needs (01/14/2024)   PRAPARE - Administrator, Civil Service (Medical): No    Lack of Transportation (Non-Medical): No  Physical Activity: Insufficiently Active (01/14/2024)   Exercise Vital Sign    Days of Exercise per Week: 3 days    Minutes of Exercise per Session: 30 min  Stress: Stress Concern Present (01/14/2024)   [employer] of Occupational Health - Occupational Stress Questionnaire    Feeling of Stress : To some extent  Social Connections: Socially Integrated (01/14/2024)   Social Connection and Isolation Panel [NHANES]    Frequency of Communication with Friends and Family: More than three times a week    Frequency of Social Gatherings with Friends and Family: More than three times a week    Attends Religious Services: More than 4 times per year    Active Member of Clubs or Organizations: Yes    Attends Banker Meetings: 1 to 4 times per year    Marital Status: Living with partner  Intimate Partner Violence: Not At Risk (01/14/2024)   Humiliation, Afraid, Rape, and Kick questionnaire    Fear of Current or Ex-Partner: No    Emotionally Abused: No     Physically Abused: No    Sexually Abused: No    Family History  Problem Relation Age of Onset   Hypertension Mother    Hypertension Father    Hypertension Sister    Breast cancer Sister 77   Diabetes Brother    Hypertension Brother     Past Surgical History:  Procedure Laterality Date   A/V FISTULAGRAM Right 06/12/2022   Procedure: A/V Fistulagram;  Surgeon: Chuck Hint, MD;  Location[facility] INVASIVE CV LAB;  Service: Cardiovascular;  Laterality: Right;   AV FISTULA PLACEMENT Left 07/05/09   Dr. Hart [place]   AV FISTULA PLACEMENT Right 09/26/2021   Procedure: RIGHT ARM ARTERIOVENOUS (AV) FISTULA CREATION;  Surgeon: Maeola Harman, MD;  Location: [facility] OR;  Service: Vascular;  Laterality: Right;   COLONOSCOPY N/A 02/10/2016   SLF: 1. HEME postive stool due to colon polyps intrernal hemorrhoids 2. small internal hemrrohoids 3. moderate external hemorrhoids.    ESOPHAGEAL DILATION N/A 02/10/2016   Procedure: ESOPHAGEAL DILATION;  Surgeon: West Bali, MD;  Location: [facility] ENDO SUITE;  Service: Endoscopy;  Laterality: N/A;   ESOPHAGOGASTRODUODENOSCOPY N/A 02/10/2016   SLF: patent Schatzki's ring , mild gastritis/ duodentitis.    FISTULA SUPERFICIALIZATION Left 11/04/2018   Procedure: PLICATION BRACHIOCEPHALIC FISTULA;  Surgeon: Chuck Hint, MD;  Location: [facility] OR;  Service: Vascular;  Laterality: Left;   FISTULOGRAM N/A 10/22/2014   Procedure: FISTULOGRAM;  Surgeon: Chuck Hint, MD;  Location: [facility] CATH LAB;  Service: Cardiovascular;  Laterality: N/A;   INSERTION OF DIALYSIS CATHETER Right 08/27/2021   Procedure: INSERTION OF 19cm TUNNELED DIALYSIS CATHETER INTO RIGHT NECK;  Surgeon: Cephus Shelling, MD;  Location: [facility] OR;  Service: Vascular;  Laterality: Right;   IR DIALY SHUNT INTRO NEEDLE/INTRACATH INITIAL W/IMG LEFT Left 02/01/2019   IR DIALY SHUNT INTRO NEEDLE/INTRACATH INITIAL W/IMG LEFT Left 06/05/2020   IR DIALY SHUNT INTRO NEEDLE/INTRACATH INITIAL W/IMG  LEFT Left 05/09/2021   IR REMOVAL TUN CV CATH W/O FL  12/04/2022   IR THROMBECTOMY AV FISTULA W/THROMBOLYSIS/PTA INC/SHUNT/IMG LEFT Left 08/18/2021   IR US GUIDE VASC ACCESS LEFT  08/18/2021   PERIPHERAL VASCULAR BALLOON ANGIOPLASTY  Left 07/14/2021   Procedure: PERIPHERAL VASCULAR BALLOON ANGIOPLASTY;  Surgeon: Maeola Harman, MD;  Location[facility] INVASIVE CV LAB;  Service: Cardiovascular;  Laterality: Left;   PERIPHERAL VASCULAR CATHETERIZATION Left 12/09/2016   Procedure: A/V Fistulagram;  Surgeon: Maeola Harman, MD;  Location: [facility] INVASIVE CV LAB;  Service: Cardiovascular;  Laterality: Left;   REVISON OF ARTERIOVENOUS FISTULA Left 10/28/2015   Procedure: PLICATION OF BRACHIOCEPHALIC FISTULA;  Surgeon: Fransisco Hertz, MD;  Location: [facility] OR;  Service: Vascular;  Laterality: Left;   REVISON OF ARTERIOVENOUS FISTULA Left 08/09/2017   Procedure: REVISION OF ARTERIOVENOUS FISTULA LIGATION OF SIDE BRANCH;  Surgeon: Maeola Harman, MD;  Location: [facility] OR;  Service: Vascular;  Laterality: Left;   TUBAL LIGATION  2004    ROS: Review of Systems Negative except as stated above  PHYSICAL EXAM: BP 101/63 (BP Location: Left Leg, Patient Position: Sitting, Cuff Size: Normal)   Pulse 77   Temp 98.6 F (37 C) (Oral)   Wt 108 lb (49 kg)   LMP 02/11/2012 Comment: tubal ligation  SpO2 100%   BMI 21.81 kg/m   Wt Readings from Last 3 Encounters:  01/14/24 108 lb (49 kg)  09/06/23 111 lb 3.2 oz (50.4 kg)  08/23/23 109 lb (49.4 kg)    Physical Exam  General appearance - alert, well appearing, and in no distress Mental status - normal mood, behavior, speech, dress, motor activity, and thought processes Chest - clear to auscultation, no wheezes, rales or rhonchi, symmetric air entry Heart - normal rate, regular rhythm, normal S1, S2, no murmurs, rubs, clicks or gallops Abdomen -nondistended, bowel sounds are hyperactive.  No organomegaly.  Slight tenderness above and below the  umbilicus over the abdominal aorta which is pulsatile.  No hernias appreciated. Extremities -no lower extremity edema.      Latest Ref Rng & Units 06/12/2022   11:06 AM 03/09/2022    9:35 AM 11/11/2021    4:48 PM  CMP  Glucose 70 - 99 mg/dL 80  84  75   BUN 6 - 20 mg/dL 28  59  20   Creatinine 0.44 - 1.00 mg/dL 6.44  0.34  7.42   Sodium 135 - 145 mmol/L 137  137  134   Potassium 3.5 - 5.1 mmol/L 4.0  4.3  3.0   Chloride 98 - 111 mmol/L 96  96  91   CO2 20 - 29 mmol/L  23  25   Calcium 8.7 - 10.2 mg/dL  9.8  9.7    Lipid Panel     Component Value Date/Time   CHOL 195 03/12/2021 0954   TRIG 109 03/12/2021 0954   HDL 65 03/12/2021 0954   CHOLHDL 3.0 03/12/2021 0954   CHOLHDL 2.8 Ratio 10/22/2010 2104   VLDL 30 10/22/2010 2104   LDLCALC 111 (H) 03/12/2021 0954    CBC    Component Value Date/Time   WBC 7.5 11/11/2021 1648   RBC 4.31 11/11/2021 1648   HGB 12.2 06/12/2022 1106   HGB 10.1 (L) 03/12/2021 0954   HCT 36.0 06/12/2022 1106   HCT 30.6 (L) 03/12/2021 0954   PLT 174 11/11/2021 1648   PLT 232 03/12/2021 0954   MCV 87.9 11/11/2021 1648   MCV 96 03/12/2021 0954   MCH 30.2 11/11/2021 1648   MCHC 34.3 11/11/2021 1648   RDW 13.7 11/11/2021 1648   RDW 14.3 03/12/2021 0954   LYMPHSABS 1.1 08/17/2021 1243   LYMPHSABS 1.1 03/12/2021 0954   MONOABS 0.3 08/17/2021  1243   EOSABS 0.0 08/17/2021 1243   EOSABS 0.1 03/12/2021 0954   BASOSABS 0.0 08/17/2021 1243   BASOSABS 0.0 03/12/2021 0954    ASSESSMENT AND PLAN: 1. 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  Continue current medications.  2. ESRD on dialysis [facility]) Patient on dialysis and goes 3 times a week.  3. Anemia in chronic kidney disease, on chronic dialysis (HCC) Followed by nephrology at dialysis.  She tells me that she receives iron infusions during dialysis when the nephrologist thinks it is  needed.  4. Periumbilical abdominal pain Patient has soreness and points to the area over pulsatile abdominal aorta.  Will get an ultrasound to check for any aneurysm. - VAS [place] AAA DUPLEX; Future  5. Pulse visible in abdominal aorta See #4 above - VAS [place] AAA DUPLEX; Future  6. Rectal pain - nystatin-triamcinolone ointment (MYCOLOG); Apply 1 application topically 2 (two) times daily.  Dispense: 30 g; Refill: 0 - lidocaine (XYLOCAINE) 5 % ointment; Apply 1 Application topically as needed.  Dispense: 35.44 g; Refill: 0    Patient was given the opportunity to ask questions.  Patient verbalized understanding of the plan and was able to repeat key elements of the plan.   This documentation was completed using Paediatric nurse.  Any transcriptional errors are unintentional.  Orders Placed This Encounter  Procedures   VAS [place] AAA DUPLEX     Requested Prescriptions   Signed Prescriptions Disp Refills   nystatin-triamcinolone ointment (MYCOLOG) 30 g 0    Sig: Apply 1 application topically 2 (two) times daily.   lidocaine (XYLOCAINE) 5 % ointment 35.44 g 0    Sig: Apply 1 Application topically as needed.    Return in about 6 months (around 07/13/2024).  Jonah Blue, MD, FACP

## 2024-01-28 ENCOUNTER — Ambulatory Visit (HOSPITAL_COMMUNITY)
Admission: RE | Admit: 2024-01-28 | Discharge: 2024-01-28 | Disposition: A | Discharge status: Home / Self Care | Payer: Self-pay | Source: Ambulatory Visit | Attending: Vascular Surgery | Admitting: Vascular Surgery

## 2024-01-28 DIAGNOSIS — R0989 Other specified symptoms and signs involving the circulatory and respiratory systems: Secondary | ICD-10-CM | POA: Insufficient documentation

## 2024-01-28 DIAGNOSIS — R1033 Periumbilical pain: Secondary | ICD-10-CM | POA: Insufficient documentation

## 2024-02-02 ENCOUNTER — Other Ambulatory Visit: Payer: Self-pay | Admitting: Internal Medicine

## 2024-02-02 ENCOUNTER — Telehealth: Payer: Self-pay

## 2024-02-02 ENCOUNTER — Other Ambulatory Visit: Payer: Self-pay

## 2024-02-02 DIAGNOSIS — G8929 Other chronic pain: Secondary | ICD-10-CM

## 2024-02-02 NOTE — Telephone Encounter (Signed)
Patient came in requesting a refill on carvedilol 12.5 mg.

## 2024-02-03 ENCOUNTER — Other Ambulatory Visit: Payer: Self-pay

## 2024-02-03 MED ORDER — CARVEDILOL 12.5 MG PO TABS
12.5000 mg | ORAL_TABLET | Freq: Two times a day (BID) | ORAL | 3 refills | Status: AC
  Filled 2024-02-03: qty 180, 90d supply, fill #0

## 2024-02-03 NOTE — Telephone Encounter (Signed)
 Called but no answer. LVM informing that refill has been sent to the pharmacy.

## 2024-02-03 NOTE — Telephone Encounter (Signed)
 Done.  Sabrina Mitchell to our pharmacy.

## 2024-02-03 NOTE — Addendum Note (Signed)
 Addended by: Concetta Dee B on: 02/03/2024 08:26 AM   Modules accepted: Orders

## 2024-02-04 ENCOUNTER — Telehealth: Payer: Self-pay | Admitting: Internal Medicine

## 2024-02-04 ENCOUNTER — Other Ambulatory Visit: Payer: Self-pay

## 2024-02-04 DIAGNOSIS — F5104 Psychophysiologic insomnia: Secondary | ICD-10-CM

## 2024-02-04 NOTE — Telephone Encounter (Signed)
 Patient came in requesting refills on ZOLPIDEM  10 MG

## 2024-02-05 MED ORDER — ZOLPIDEM TARTRATE 10 MG PO TABS: 10.0000 mg | ORAL_TABLET | Freq: Every evening | ORAL | 3 refills | Status: DC | PRN

## 2024-02-05 NOTE — Telephone Encounter (Signed)
 Refill sent on Ambien .

## 2024-02-05 NOTE — Addendum Note (Signed)
 Addended by: Concetta Dee B on: 02/05/2024 06:14 PM   Modules accepted: Orders

## 2024-02-07 NOTE — Telephone Encounter (Signed)
 Called & spoke to the patient. Verified name & DOB. Informed that all requested refills have been sent to the pharmacy. Patient stated that she has picked up al refills. No further assistance at this time.

## 2024-02-10 ENCOUNTER — Other Ambulatory Visit: Payer: Self-pay

## 2024-02-10 MED ORDER — CARVEDILOL 12.5 MG PO TABS
12.5000 mg | ORAL_TABLET | Freq: Two times a day (BID) | ORAL | 3 refills | Status: DC
  Filled 2024-02-10 – 2024-06-16 (×2): qty 180, 90d supply, fill #0
  Filled 2024-07-12: qty 60, 30d supply, fill #0
  Filled 2024-09-13: qty 60, 30d supply, fill #1
  Filled 2024-11-27: qty 60, 30d supply, fill #2
  Filled 2025-01-12: qty 60, 30d supply, fill #3

## 2024-02-10 NOTE — Progress Notes (Deleted)
S:     No chief complaint on file.  63 y.o. female who presents for diabetes evaluation, education, and management. Patient arrives in *** good spirits and presents without *** any assistance. ***Patient is accompanied by ***.   Patient was referred and last seen by Primary Care Provider, Dr. Laural Benes, on 01/14/2024. BP 101/63.  *** Patient was referred by *** on ***. Patient was last seen by Primary Care Provider, Dr. ***, on ***.   PMH is significant for HTN, pseudoaneurysm of arteriovenous dialysis fistula, pulmonay edema, GERD, secondary hyperparathyroidism of renal origin, ulnar neuropathy of left upper extremity, calcaneal spur of right foot, pruritus, end stage kidney disease on hemodialysis three times weekly, anemia in CKD, insomnia, depression, anxiety, chest pain.   At last visit, ***.   Patient reports Diabetes was diagnosed in ***.   Family/Social History: ***  Current diabetes medications include: *** Current hypertension medications include: *** Current hyperlipidemia medications include: ***  Patient reports adherence to taking all medications as prescribed.  *** Patient denies adherence with medications, reports missing *** medications *** times per week, on average.  Do you feel that your medications are working for you? {YES NO:22349} Have you been experiencing any side effects to the medications prescribed? {YES NO:22349} Do you have any problems obtaining medications due to transportation or finances? {YES [id number] Insurance coverage: ***  Patient {Actions; denies-reports:120008} hypoglycemic events.  Reported home fasting blood sugars: ***  Reported 2 hour post-meal/random blood sugars: ***.  Patient {Actions; denies-reports:120008} nocturia (nighttime urination).  Patient {Actions; denies-reports:120008} neuropathy (nerve pain). Patient {Actions; denies-reports:120008} visual changes. Patient {Actions; denies-reports:120008} self foot exams.    Patient reported dietary habits: Eats *** meals/day Breakfast: *** Lunch: *** Dinner: *** Snacks: *** Drinks: ***  Within the past 12 months, did you worry whether your food would run out before you got money to buy more? {YES NO:22349} Within the past 12 months, did the food you bought run out, and you didn't have money to get more? {YES NO:22349} PHQ-9 Score: ***  Patient-reported exercise habits: ***   O:   ROS  Physical Exam  7 day average blood glucose: ***  Libre3 CGM Download today *** % Time CGM is active: ***% Average Glucose: *** mg/dL Glucose Management Indicator: ***  Glucose Variability: ***% (goal <36%) Time in Goal:  - Time in range 70-180: ***% - Time above range: ***% - Time below range: ***% Observed patterns:   Lab Results  Component Value Date   HGBA1C 4.8 04/23/2023   There were no vitals filed for this visit.  Lipid Panel     Component Value Date/Time   CHOL 195 03/12/2021 0954   TRIG 109 03/12/2021 0954   HDL 65 03/12/2021 0954   CHOLHDL 3.0 03/12/2021 0954   CHOLHDL 2.8 Ratio 10/22/2010 2104   VLDL 30 10/22/2010 2104   LDLCALC 111 (H) 03/12/2021 0954    Clinical Atherosclerotic Cardiovascular Disease (ASCVD): {YES/NO:21197} The 10-year ASCVD risk score (Arnett DK, et al., 2019) is: 2.4%   Values used to calculate the score:     Age: 75 years     Sex: Female     Is Non-Hispanic African American: No     Diabetic: No     Tobacco smoker: No     Systolic Blood Pressure: 101 mmHg     Is BP treated: Yes     HDL Cholesterol: 65 mg/dL     Total Cholesterol: 195 mg/dL   Patient is participating in a  Managed Medicaid Plan:  {MM YES/NO:27447::"Yes"}   A/P: Diabetes longstanding *** currently ***. Patient is *** able to verbalize appropriate hypoglycemia management plan. Medication adherence appears ***. Control is suboptimal due to ***. -{Meds adjust:18428} basal insulin *** Lantus/Basaglar/Semglee (insulin glargine) *** Tresiba  (insulin degludec) from *** units to *** units daily in the morning. Patient will continue to titrate 1 unit every *** days if fasting blood sugar > 100mg /dl until fasting blood sugars reach goal or next visit.  -{Meds adjust:18428} rapid insulin *** Novolog (insulin aspart) *** Humalog (insulin lispro) from *** to ***.  -{Meds adjust:18428} GLP-1 *** Trulicity (dulaglutide) *** Ozempic (semaglutide) *** Mounjaro (tirzepatide) from *** mg to *** mg .  -{Meds adjust:18428} SGLT2-I *** Farxiga (dapagliflozin) *** Jardiance (empagliflozin) 10 mg. Counseled on sick day rules. -{Meds adjust:18428} metformin ***.  -Patient educated on purpose, proper use, and potential adverse effects of ***.  -Extensively discussed pathophysiology of diabetes, recommended lifestyle interventions, dietary effects on blood sugar control.  -Counseled on s/sx of and management of hypoglycemia.  -Next A1c anticipated ***.   ASCVD risk - primary ***secondary prevention in patient with diabetes. Last LDL is *** not at goal of <43 *** mg/dL. ASCVD risk factors include *** and 10-year ASCVD risk score of ***. {Desc; low/moderate/high:110033} intensity statin indicated.  -{Meds adjust:18428} ***statin *** mg.   Hypertension longstanding *** currently ***. Blood pressure goal of <130/80 *** mmHg. Medication adherence ***. Blood pressure control is suboptimal due to ***. -{Meds adjust:18428} *** mg.  Written patient instructions provided. Patient verbalized understanding of treatment plan.  Total time in face to face counseling *** minutes.    Follow-up:  Pharmacist *** PCP clinic visit in *** Patient seen with ***

## 2024-02-11 ENCOUNTER — Ambulatory Visit: Payer: Self-pay | Admitting: Pharmacist

## 2024-02-14 ENCOUNTER — Other Ambulatory Visit: Payer: Self-pay

## 2024-03-01 NOTE — Progress Notes (Deleted)
 Office Visit Note  Patient: Sabrina Mitchell             Date of Birth: [birth year]           MRN: [id number]             PCP: Marcine Matar, MD Referring: Darnell Level, MD Visit Date: 03/15/2024 Occupation: @GUAROCC @  Subjective:  No chief complaint on file.   History of Present Illness: Sabrina Mitchell is a 63 y.o. female ***     Activities of Daily Living:  Patient reports morning stiffness for *** {minute/hour:19697}.   Patient {ACTIONS;DENIES/REPORTS:21021675::"Denies"} nocturnal pain.  Difficulty dressing/grooming: {ACTIONS;DENIES/REPORTS:21021675::"Denies"} Difficulty climbing stairs: {ACTIONS;DENIES/REPORTS:21021675::"Denies"} Difficulty getting out of chair: {ACTIONS;DENIES/REPORTS:21021675::"Denies"} Difficulty using hands for taps, buttons, cutlery, and/or writing: {ACTIONS;DENIES/REPORTS:21021675::"Denies"}  No Rheumatology ROS completed.   PMFS History:  Patient Active Problem List   Diagnosis Date Noted   Psychophysiological insomnia 02/13/2021   New onset of headaches 02/13/2021   Depression with anxiety 10/30/2020   Pulmonary edema 09/26/2019   Chest pain 09/26/2019   Elevated troponin 09/26/2019   Shortness of breath 05/16/2019   Calcaneal spur of right foot 10/10/2018   Ulnar neuropathy of left upper extremity 03/11/2018   GERD (gastroesophageal reflux disease) 01/17/2016   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HCC) 10/23/2015   Dependence on renal dialysis (HCC) 10/15/2015   Pruritus, unspecified 12/26/2014   Diarrhea, unspecified 09/13/2014   Fever, unspecified 08/30/2014   Pain, unspecified 08/30/2014   Generalized abdominal pain 04/05/2013   Post-menopausal bleeding 04/06/2012   End stage renal disease on dialysis (HCC) 04/06/2012   Hypertension 04/06/2012   Anemia in chronic kidney disease 03/10/2012   Secondary hyperparathyroidism of renal origin (HCC) 03/10/2012   Iron deficiency anemia, unspecified  01/23/2012   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) 03/25/2010    Past Medical History:  Diagnosis Date   Anemia    Anxiety    Complication of anesthesia    Depression    denies   End stage renal disease (HCC)    hemodialysis 3x/wk @ [address]   Family history of adverse reaction to anesthesia    sister vomiting after uterine cyst removal   Gastritis    GERD (gastroesophageal reflux disease)    Hemorrhoids    Hypertension    Pneumonia    years ago   PONV (postoperative nausea and vomiting)    UTI (lower urinary tract infection) March 2014    Family History  Problem Relation Age of Onset   Hypertension Mother    Hypertension Father    Hypertension Sister    Breast cancer Sister 72   Diabetes Brother    Hypertension Brother    Past Surgical History:  Procedure Laterality Date   A/V FISTULAGRAM Right 06/12/2022   Procedure: A/V Fistulagram;  Surgeon: Chuck Hint, MD;  Location: [facility] INVASIVE CV LAB;  Service: Cardiovascular;  Laterality: Right;   AV FISTULA PLACEMENT Left 07/05/09   Dr. Hart Rochester   AV FISTULA PLACEMENT Right 09/26/2021   Procedure: RIGHT ARM ARTERIOVENOUS (AV) FISTULA CREATION;  Surgeon: Maeola Harman, MD;  Location: [facility] OR;  Service: Vascular;  Laterality: Right;   COLONOSCOPY N/A 02/10/2016   SLF: 1. HEME postive stool due to colon polyps intrernal hemorrhoids 2. small internal hemrrohoids 3. moderate external hemorrhoids.    ESOPHAGEAL DILATION N/A 02/10/2016   Procedure: ESOPHAGEAL DILATION;  Surgeon: West Bali, MD;  Location: [facility] ENDO  SU[facility];  Service: Endoscopy;  Laterality: N/A;   ESOPHAGOGASTRODUODENOSCOPY N/A 02/10/2016   SLF: patent Schatzki's ring , mild gastritis/ duodentitis.    FISTULA SUPERFICIALIZATION Left 11/04/2018   Procedure: PLICATION BRACHIOCEPHALIC FISTULA;  Surgeon: Chuck Hint, MD;  Location: [facility] OR;  Service: Vascular;  Laterality:  Left;   FISTULOGRAM N/A 10/22/2014   Procedure: FISTULOGRAM;  Surgeon: Chuck Hint, MD;  Location: [facility] CATH LAB;  Service: Cardiovascular;  Laterality: N/A;   INSERTION OF DIALYSIS CATHETER Right 08/27/2021   Procedure: INSERTION OF 19cm TUNNELED DIALYSIS CATHETER INTO RIGHT NECK;  Surgeon: Cephus Shelling, MD;  Location: [facility] OR;  Service: Vascular;  Laterality: Right;   IR DIALY SHUNT INTRO NEEDLE/INTRACATH INITIAL W/IMG LEFT Left 02/01/2019   IR DIALY SHUNT INTRO NEEDLE/INTRACATH INITIAL W/IMG LEFT Left 06/05/2020   IR DIALY SHUNT INTRO NEEDLE/INTRACATH INITIAL W/IMG LEFT Left 05/09/2021   IR REMOVAL TUN CV CATH W/O FL  12/04/2022   IR THROMBECTOMY AV FISTULA W/THROMBOLYSIS/PTA INC/SHUNT/IMG LEFT Left 08/18/2021   IR US GUIDE VASC ACCESS LEFT  08/18/2021   PERIPHERAL VASCULAR BALLOON ANGIOPLASTY Left 07/14/2021   Procedure: PERIPHERAL VASCULAR BALLOON ANGIOPLASTY;  Surgeon: Maeola Harman, MD;  Location: [facility] INVASIVE CV LAB;  Service: Cardiovascular;  Laterality: Left;   PERIPHERAL VASCULAR CATHETERIZATION Left 12/09/2016   Procedure: A/V Fistulagram;  Surgeon: Maeola Harman, MD;  Location: [facility] INVASIVE CV LAB;  Service: Cardiovascular;  Laterality: Left;   REVISON OF ARTERIOVENOUS FISTULA Left 10/28/2015   Procedure: PLICATION OF BRACHIOCEPHALIC FISTULA;  Surgeon: Fransisco Hertz, MD;  Location: [facility] OR;  Service: Vascular;  Laterality: Left;   REVISON OF ARTERIOVENOUS FISTULA Left 08/09/2017   Procedure: REVISION OF ARTERIOVENOUS FISTULA LIGATION OF SIDE BRANCH;  Surgeon: Maeola Harman, MD;  Location: [facility] OR;  Service: Vascular;  Laterality: Left;   TUBAL LIGATION  2004   Social History   Social History Narrative   Not on file   Immunization History  Administered Date(s) Administered   Influenza,inj,Quad PF,6+ Mos 09/30/2019, 09/30/2019, 10/26/2020, 10/14/2021   PFIZER(Purple Top)SARS-COV-2 Vaccination 03/12/2020   PNEUMOCOCCAL CONJUGATE-20 06/06/2021    Pneumococcal Conjugate-13 09/19/2013   Pneumococcal Polysaccharide-23 03/16/2017   Tdap 12/10/2017   Zoster Recombinant(Shingrix) 06/20/2021, 10/06/2021     Objective: Vital Signs: LMP 02/11/2012 Comment: tubal ligation   Physical Exam   Musculoskeletal Exam: ***  CDAI Exam: CDAI Score: -- Patient Global: --; Provider Global: -- Swollen: --; Tender: -- Joint Exam 03/15/2024   No joint exam has been documented for this visit   There is currently no information documented on the homunculus. Go to the Rheumatology activity and complete the homunculus joint exam.  Investigation: No additional findings.  Imaging: No results found.  Recent Labs: Lab Results  Component Value Date   WBC 7.5 11/11/2021   HGB 12.2 06/12/2022   PLT 174 11/11/2021   NA 137 06/12/2022   K 4.0 06/12/2022   CL 96 (L) 06/12/2022   CO2 23 03/09/2022   GLUCOSE 80 06/12/2022   BUN 28 (H) 06/12/2022   CREATININE 6.50 (H) 06/12/2022   BILITOT 0.6 09/26/2019   ALKPHOS 88 09/26/2019   AST 39 09/26/2019   ALT 33 09/26/2019   PROT 6.9 09/26/2019   ALBUMIN 3.4 (L) 09/27/2019   CALCIUM 9.8 03/09/2022   GFRAA 7 (L) 03/27/2020    Speciality Comments: No specialty comments available.  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  No orders of the defined types were placed in this encounter.   Face-to-face time spent with patient was *** minutes. Greater than 50% of time was spent in counseling and coordination of care.  Follow-Up Instructions: No follow-ups on file.   Gearldine Bienenstock, PA-C  Note - This record has been created using Dragon software.  Chart creation errors have been sought, but may not always  have been located. Such creation errors do not reflect on  the standard of medical care.

## 2024-03-15 ENCOUNTER — Encounter: Payer: No Typology Code available for payment source | Admitting: Rheumatology

## 2024-03-15 DIAGNOSIS — I10 Essential (primary) hypertension: Secondary | ICD-10-CM

## 2024-03-15 DIAGNOSIS — N2581 Secondary hyperparathyroidism of renal origin: Secondary | ICD-10-CM

## 2024-03-15 DIAGNOSIS — D631 Anemia in chronic kidney disease: Secondary | ICD-10-CM

## 2024-03-15 DIAGNOSIS — K219 Gastro-esophageal reflux disease without esophagitis: Secondary | ICD-10-CM

## 2024-03-15 DIAGNOSIS — F418 Other specified anxiety disorders: Secondary | ICD-10-CM

## 2024-03-15 DIAGNOSIS — Z8639 Personal history of other endocrine, nutritional and metabolic disease: Secondary | ICD-10-CM

## 2024-03-15 DIAGNOSIS — G5622 Lesion of ulnar nerve, left upper limb: Secondary | ICD-10-CM

## 2024-03-15 DIAGNOSIS — M1990 Unspecified osteoarthritis, unspecified site: Secondary | ICD-10-CM

## 2024-03-15 DIAGNOSIS — I12 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 disease: Secondary | ICD-10-CM

## 2024-03-15 DIAGNOSIS — Z992 Dependence on renal dialysis: Secondary | ICD-10-CM

## 2024-03-15 DIAGNOSIS — T82510D Breakdown (mechanical) of surgically created arteriovenous fistula, subsequent encounter: Secondary | ICD-10-CM

## 2024-03-22 ENCOUNTER — Telehealth: Payer: Self-pay

## 2024-03-22 ENCOUNTER — Other Ambulatory Visit: Payer: Self-pay

## 2024-03-22 DIAGNOSIS — F5104 Psychophysiologic insomnia: Secondary | ICD-10-CM

## 2024-03-22 NOTE — Telephone Encounter (Signed)
 Patient is requesting refills on zolpidem (AMBIEN) 10 MG tablet

## 2024-03-24 NOTE — Telephone Encounter (Signed)
 Called & spoke to the patient. Verified name & DOB. Informed that there are additional refills at the pharmacy. Instructed to contact the pharmacy. Patient expressed verbal understanding.

## 2024-03-24 NOTE — Telephone Encounter (Signed)
 Should still have Rfs left from last rxn written 01/2024. Needs to call her pharmacy.

## 2024-04-10 ENCOUNTER — Other Ambulatory Visit: Payer: Self-pay

## 2024-04-10 ENCOUNTER — Other Ambulatory Visit: Payer: Self-pay | Admitting: Internal Medicine

## 2024-04-10 ENCOUNTER — Telehealth: Payer: Self-pay

## 2024-04-10 DIAGNOSIS — K6289 Other specified diseases of anus and rectum: Secondary | ICD-10-CM

## 2024-04-10 MED ORDER — LIDOCAINE 5 % EX OINT
1.0000 | TOPICAL_OINTMENT | CUTANEOUS | 0 refills | Status: DC | PRN
  Filled 2024-04-10: qty 35.44, 30d supply, fill #0

## 2024-04-10 MED ORDER — NYSTATIN-TRIAMCINOLONE 100000-0.1 UNIT/GM-% EX OINT
1.0000 | TOPICAL_OINTMENT | Freq: Two times a day (BID) | CUTANEOUS | 0 refills | Status: DC
  Filled 2024-04-10: qty 30, 15d supply, fill #0

## 2024-04-10 NOTE — Telephone Encounter (Signed)
 Patient is requesting refills on lidocaine (XYLOCAINE) 5 % & nystatin-triamcinolone ointment (MYCOLOG)

## 2024-04-10 NOTE — Telephone Encounter (Signed)
 Called & spoke to the patient. Verified name & DOB. Informed that refills have been sent to our pharmacy. Patient expressed verbal understanding.

## 2024-04-10 NOTE — Telephone Encounter (Signed)
 Rxns sent to our pharmacy.

## 2024-04-14 ENCOUNTER — Other Ambulatory Visit: Payer: Self-pay

## 2024-04-17 ENCOUNTER — Encounter: Payer: Self-pay | Admitting: Internal Medicine

## 2024-04-19 ENCOUNTER — Ambulatory Visit: Payer: No Typology Code available for payment source | Admitting: Rheumatology

## 2024-04-25 ENCOUNTER — Telehealth: Payer: Self-pay

## 2024-04-25 ENCOUNTER — Other Ambulatory Visit: Payer: Self-pay

## 2024-04-25 MED ORDER — AMOXICILLIN-POT CLAVULANATE 500-125 MG PO TABS
1.0000 | ORAL_TABLET | Freq: Every evening | ORAL | 0 refills | Status: DC
Start: 2024-04-25 — End: 2024-07-17
  Filled 2024-04-25: qty 7, 7d supply, fill #0

## 2024-04-25 NOTE — Telephone Encounter (Signed)
DISCARD

## 2024-05-01 ENCOUNTER — Other Ambulatory Visit: Payer: Self-pay

## 2024-05-10 ENCOUNTER — Other Ambulatory Visit: Payer: Self-pay | Admitting: Internal Medicine

## 2024-05-10 ENCOUNTER — Telehealth: Payer: Self-pay

## 2024-05-10 ENCOUNTER — Other Ambulatory Visit: Payer: Self-pay

## 2024-05-10 DIAGNOSIS — I129 Hypertensive chronic kidney disease with stage 1 through stage 4 chronic kidney disease, or unspecified chronic kidney disease: Secondary | ICD-10-CM

## 2024-05-10 MED ORDER — AMLODIPINE BESYLATE 5 MG PO TABS
5.0000 mg | ORAL_TABLET | Freq: Two times a day (BID) | ORAL | 1 refills | Status: DC
  Filled 2024-05-10: qty 60, 30d supply, fill #0
  Filled 2024-06-16 – 2024-07-12 (×2): qty 60, 30d supply, fill #1

## 2024-05-10 NOTE — Telephone Encounter (Signed)
 Patient is requesting refills on amLODipine  (NORVASC ) 5 MG tablet [[id number]]

## 2024-05-11 ENCOUNTER — Other Ambulatory Visit: Payer: Self-pay

## 2024-05-11 NOTE — Telephone Encounter (Signed)
 Called but no answer. LVM informing of requested refill sent to her pharmacy.

## 2024-05-12 ENCOUNTER — Other Ambulatory Visit: Payer: Self-pay

## 2024-05-16 ENCOUNTER — Ambulatory Visit: Payer: Self-pay | Admitting: Internal Medicine

## 2024-05-23 ENCOUNTER — Other Ambulatory Visit: Payer: Self-pay

## 2024-05-23 MED ORDER — CINACALCET HCL 90 MG PO TABS
90.0000 mg | ORAL_TABLET | Freq: Every day | ORAL | 11 refills | Status: AC
  Filled 2024-05-23: qty 30, 30d supply, fill #0

## 2024-05-24 ENCOUNTER — Other Ambulatory Visit: Payer: Self-pay

## 2024-06-05 ENCOUNTER — Other Ambulatory Visit: Payer: Self-pay

## 2024-06-16 ENCOUNTER — Other Ambulatory Visit: Payer: Self-pay

## 2024-06-16 ENCOUNTER — Ambulatory Visit: Payer: Self-pay | Admitting: Internal Medicine

## 2024-07-12 ENCOUNTER — Other Ambulatory Visit: Payer: Self-pay | Admitting: Internal Medicine

## 2024-07-12 ENCOUNTER — Other Ambulatory Visit: Payer: Self-pay

## 2024-07-12 ENCOUNTER — Telehealth: Payer: Self-pay | Admitting: Internal Medicine

## 2024-07-12 DIAGNOSIS — K6289 Other specified diseases of anus and rectum: Secondary | ICD-10-CM

## 2024-07-12 MED ORDER — NYSTATIN-TRIAMCINOLONE 100000-0.1 UNIT/GM-% EX OINT
1.0000 | TOPICAL_OINTMENT | Freq: Two times a day (BID) | CUTANEOUS | 0 refills | Status: DC
  Filled 2024-07-12: qty 30, 15d supply, fill #0

## 2024-07-12 NOTE — Telephone Encounter (Signed)
 Patient presented to the lobby requesting a refill of Nystatin -Triamcinolone  ointment. She is scheduled to be seen on 7/21 but inquired if the medication could be provided in advance. Please advise.

## 2024-07-13 ENCOUNTER — Other Ambulatory Visit: Payer: Self-pay

## 2024-07-13 MED ORDER — NYSTATIN-TRIAMCINOLONE 100000-0.1 UNIT/GM-% EX OINT
1.0000 | TOPICAL_OINTMENT | Freq: Two times a day (BID) | CUTANEOUS | 0 refills | Status: DC
  Filled 2024-07-13: qty 30, 15d supply, fill #0

## 2024-07-13 NOTE — Telephone Encounter (Signed)
 Called & spoke to the patient. Verified name & DOB. Informed that requested refill has been sent to the pharmacy. Patient expressed verbal understanding.

## 2024-07-13 NOTE — Addendum Note (Signed)
 Addended by: VICCI SOBER B on: 07/13/2024 07:14 AM   Modules accepted: Orders

## 2024-07-13 NOTE — Telephone Encounter (Signed)
 Rxn sent to our pharmacy.

## 2024-07-14 ENCOUNTER — Telehealth: Payer: Self-pay | Admitting: Internal Medicine

## 2024-07-14 ENCOUNTER — Ambulatory Visit: Payer: Self-pay | Admitting: Internal Medicine

## 2024-07-14 NOTE — Telephone Encounter (Signed)
 Called patient, no answer. Left voicemail confirming upcoming appointment on 07/17/2024 with Dr.Johnson at 2:10 pm. Provided callback number for any questions or changes.

## 2024-07-17 ENCOUNTER — Other Ambulatory Visit: Payer: Self-pay

## 2024-07-17 ENCOUNTER — Ambulatory Visit: Payer: Self-pay | Attending: Internal Medicine | Admitting: Internal Medicine

## 2024-07-17 ENCOUNTER — Encounter: Payer: Self-pay | Admitting: Internal Medicine

## 2024-07-17 VITALS — BP 129/85 | HR 69 | Wt 108.0 lb

## 2024-07-17 DIAGNOSIS — K6289 Other specified diseases of anus and rectum: Secondary | ICD-10-CM

## 2024-07-17 DIAGNOSIS — I12 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 disease: Secondary | ICD-10-CM

## 2024-07-17 DIAGNOSIS — Z992 Dependence on renal dialysis: Secondary | ICD-10-CM

## 2024-07-17 DIAGNOSIS — N186 End stage renal disease: Secondary | ICD-10-CM

## 2024-07-17 DIAGNOSIS — M546 Pain in thoracic spine: Secondary | ICD-10-CM

## 2024-07-17 DIAGNOSIS — G8929 Other chronic pain: Secondary | ICD-10-CM

## 2024-07-17 MED ORDER — LIDOCAINE 5 % EX OINT
1.0000 | TOPICAL_OINTMENT | CUTANEOUS | 0 refills | Status: DC | PRN
  Filled 2024-07-17: qty 35.44, 30d supply, fill #0

## 2024-07-17 NOTE — Progress Notes (Signed)
 Patient ID: Sabrina Mitchell, female    DOB: Oct 28, 1961  MRN: [id number]  CC: Medical Management of Chronic Issues (Pt states she has been feeling (sharp pain in back near the shoulder blade of left side)) and Dizziness   Subjective: Sabrina Mitchell is a 63 y.o. female who presents for chronic ds management. Her concerns today include:  ESRD on HD, HTN, ACD, GERD, vit D def, external hemorrhoids and insomnia.   [employer] Language interpreter used during this encounter. #Daniel [phone]   Discussed the use of AI scribe software for clinical note transcription with the patient, who gave verbal consent to proceed.  History of Present Illness Sabrina Mitchell is a 63 year old female with hypertension who presents with sharp back pain.  She experiences sharp, needle-like back pain located between and below the shoulder blades, primarily on the left side, but sometimes affecting both sides. The pain occurs almost daily and persists throughout the day. Certain positions, such as placing her hand on the sofa, exacerbate the pain. She has been experiencing this pain for several months. No initiating factors. Better when her daughter massages her back.  She occasionally takes Tylenol  500 mg for the back pain, which provides minimal relief. She is unsure of the cause of the pain but mentions a previous suggestion of arthritis by another doctor. Additionally, she reports pain in her fingers, which are becoming deformed and painful.  She is currently on amlodipine  5 mg and carvedilol  12.5 mg for hypertension, taken daily. Her blood pressure was slightly elevated this morning, which she attributes to fluid retention. She undergoes dialysis on Tuesdays, Thursdays, and Saturdays.  Recently, she had a cold with significant cough and phlegm, for which she took Robitussin and reports feeling better now but wanted me to listen to her lungs  Request refill on lidocaine  ointment which she uses  when hemorrhoids flareup.    Patient Active Problem List   Diagnosis Date Noted   Psychophysiological insomnia 02/13/2021   New onset of headaches 02/13/2021   Depression with anxiety 10/30/2020   Pulmonary edema 09/26/2019   Chest pain 09/26/2019   Elevated troponin 09/26/2019   Shortness of breath 05/16/2019   Calcaneal spur of right foot 10/10/2018   Ulnar neuropathy of left upper extremity 03/11/2018   GERD (gastroesophageal reflux disease) 01/17/2016   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HCC) 10/23/2015   Dependence on renal dialysis (HCC) 10/15/2015   Pruritus, unspecified 12/26/2014   Diarrhea, unspecified 09/13/2014   Fever, unspecified 08/30/2014   Pain, unspecified 08/30/2014   Generalized abdominal pain 04/05/2013   Post-menopausal bleeding 04/06/2012   End stage renal disease on dialysis (HCC) 04/06/2012   Hypertension 04/06/2012   Anemia in chronic kidney disease 03/10/2012   Secondary hyperparathyroidism of renal origin (HCC) 03/10/2012   Iron deficiency anemia, unspecified 01/23/2012   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) 03/25/2010     Current Outpatient Medications on File Prior to Visit  Medication Sig Dispense Refill   acetaminophen  (TYLENOL ) 500 MG tablet Take 500-1,000 mg by mouth every 6 (six) hours as needed for moderate pain or headache.     amLODipine  (NORVASC ) 5 MG tablet Take 1 tablet (5 mg total) by mouth 2 (two) times daily for blood pressure. 60 tablet 1   calcium carbonate (TUMS - DOSED IN MG ELEMENTAL CALCIUM) 500 MG chewable tablet Chew 1-2 tablets by mouth 3 (three) times daily as needed for indigestion or  heartburn.     carvedilol  (COREG ) 12.5 MG tablet Take 1 tablet (12.5 mg total) by mouth 2 (two) times daily for blood pressure. 180 tablet 3   carvedilol  (COREG ) 12.5 MG tablet Take 1 tablet (12.5 mg total) by mouth 2 (two) times daily for high blood pressure.  180 tablet 3   ferric citrate (AURYXIA) 1 GM 210 MG(Fe) tablet Take 420-630 mg by mouth See admin instructions. Take 2-3 tablets (420 - 630 mg) by mouth with each meal or snack     Omega-3 Fatty Acids (OMEGA 3 500) 500 MG CAPS Take 500 mg by mouth every other day.     zolpidem  (AMBIEN ) 10 MG tablet Take 1 tablet (10 mg total) by mouth at bedtime as needed for sleep. 30 tablet 3   amoxicillin -clavulanate (AUGMENTIN ) 500-125 MG tablet Take 1 tablet by mouth every evening 7 tablet 0   B Complex-C-Folic Acid (DIALYVITE 800) 0.8 MG TABS Take 1 tablet by mouth daily at 2 PM.     cinacalcet  (SENSIPAR ) 60 MG tablet Take 60 mg by mouth daily.     cinacalcet  (SENSIPAR ) 90 MG tablet Take 1 tablet (90 mg total) by mouth daily. TAKE WITH LARGEST MEAL. 30 tablet 11   diclofenac  Sodium (VOLTAREN ) 1 % GEL Apply 1 a small amount to affected area twice a day as needed for pain 100 g 3   dicyclomine  (BENTYL ) 10 MG capsule Take 1 capsule (10 mg total) by mouth 4 (four) times daily -  before meals and at bedtime. For stomach pain 60 capsule 1   ethyl chloride spray Spray 1 as directed to skin three times a week as directed apply a small amount of spray to dialysis access just before needle stick 116 mL 11   hydrocortisone  (ANUSOL -HC) 2.5 % rectal cream Place 1 Application rectally 2 (two) times daily. 30 g 0   hydrocortisone  (ANUSOL -HC) 25 MG suppository Place 1 suppository (25 mg total) rectally 2 (two) times daily as needed for hemorrhoids or anal itching. 12 suppository 0   lidocaine  (XYLOCAINE ) 5 % ointment Apply 1 Application topically as needed. 35.44 g 0   nystatin -triamcinolone  ointment (MYCOLOG) Apply 1 application topically 2 (two) times daily. 30 g 0   omeprazole  (PRILOSEC) 20 MG capsule Take 1 capsule (20 mg total) by mouth 2 (two) times daily before a meal.(30 mins prior to breakfast and supper) 60 capsule 6   Current Facility-Administered Medications on File Prior to Visit  Medication Dose Route Frequency  Provider Last Rate Last Admin   0.9 %  sodium chloride  infusion  250 mL Intravenous PRN Robins, Joshua E, MD       sodium chloride  flush (NS) 0.9 % injection 3 mL  3 mL Intravenous Q12H Robins, Joshua E, MD        Allergies  Allergen Reactions   Covid-19 (Mrna) Vaccine Swelling    Tongue swelling   Morphine  And Codeine  Nausea And Vomiting   Remeron  [Mirtazapine ] Other (See Comments)    Chest pain   Zoloft  [Sertraline ] Other (See Comments)    Chest pain and chills   Elavil  [Amitriptyline ] Other (See Comments)    Makes her shake   Trazodone  And Nefazodone Other (See Comments)    Makes her shake    Social History   Socioeconomic History   Marital status: Single    Spouse name: Not on file   Number of children: 2   Years of education: Not on file   Highest education level: Not on file  Occupational History   Not on file  Tobacco Use   Smoking status: Never   Smokeless tobacco: Never  Vaping Use   Vaping status: Never Used  Substance and Sexual Activity   Alcohol use: No   Drug use: No   Sexual activity: Not Currently    Birth control/protection: Surgical  Other Topics Concern   Not on file  Social History Narrative   Not on file   Social Drivers of Health   Financial Resource Strain: High Risk (01/14/2024)   Overall Financial Resource Strain (CARDIA)    Difficulty of Paying Living Expenses: Very hard  Food Insecurity: Food Insecurity Present (01/14/2024)   Hunger Vital Sign    Worried About Running Out of Food in the Last Year: Sometimes true    Ran Out of Food in the Last Year: Sometimes true  Transportation Needs: No Transportation Needs (01/14/2024)   PRAPARE - Administrator, Civil Service (Medical): No    Lack of Transportation (Non-Medical): No  Physical Activity: Insufficiently Active (01/14/2024)   Exercise Vital Sign    Days of Exercise per Week: 3 days    Minutes of Exercise per Session: 30 min  Stress: Stress Concern Present (01/14/2024)    [employer] of Occupational Health - Occupational Stress Questionnaire    Feeling of Stress : To some extent  Social Connections: Socially Integrated (01/14/2024)   Social Connection and Isolation Panel    Frequency of Communication with Friends and Family: More than three times a week    Frequency of Social Gatherings with Friends and Family: More than three times a week    Attends Religious Services: More than 4 times per year    Active Member of Clubs or Organizations: Yes    Attends Banker Meetings: 1 to 4 times per year    Marital Status: Living with partner  Intimate Partner Violence: Not At Risk (01/14/2024)   Humiliation, Afraid, Rape, and Kick questionnaire    Fear of Current or Ex-Partner: No    Emotionally Abused: No    Physically Abused: No    Sexually Abused: No    Family History  Problem Relation Age of Onset   Hypertension Mother    Hypertension Father    Hypertension Sister    Breast cancer Sister 23   Diabetes Brother    Hypertension Brother     Past Surgical History:  Procedure Laterality Date   A/V FISTULAGRAM Right 06/12/2022   Procedure: A/V Fistulagram;  Surgeon: Eliza Lonni RAMAN, MD;  Location: [facility] INVASIVE CV LAB;  Service: Cardiovascular;  Laterality: Right;   AV FISTULA PLACEMENT Left 07/05/09   Dr. Gerlean   AV FISTULA PLACEMENT Right 09/26/2021   Procedure: RIGHT ARM ARTERIOVENOUS (AV) FISTULA CREATION;  Surgeon: Sheree Penne Lonni, MD;  Location: [facility] OR;  Service: Vascular;  Laterality: Right;   COLONOSCOPY N/A 02/10/2016   SLF: 1. HEME postive stool due to colon polyps intrernal hemorrhoids 2. small internal hemrrohoids 3. moderate external hemorrhoids.    ESOPHAGEAL DILATION N/A 02/10/2016   Procedure: ESOPHAGEAL DILATION;  Surgeon: Margo LITTIE Haddock, MD;  Location: [facility] ENDO SUITE;  Service: Endoscopy;  Laterality: N/A;   ESOPHAGOGASTRODUODENOSCOPY N/A 02/10/2016   SLF: patent Schatzki's ring , mild gastritis/ duodentitis.     FISTULA SUPERFICIALIZATION Left 11/04/2018   Procedure: PLICATION BRACHIOCEPHALIC FISTULA;  Surgeon: Eliza Lonni RAMAN, MD;  Location: [facility] OR;  Service: Vascular;  Laterality: Left;   FISTULOGRAM N/A 10/22/2014   Procedure: FISTULOGRAM;  Surgeon:  Lonni GORMAN Blade, MD;  Location: [facility] CATH LAB;  Service: Cardiovascular;  Laterality: N/A;   INSERTION OF DIALYSIS CATHETER Right 08/27/2021   Procedure: INSERTION OF 19cm TUNNELED DIALYSIS CATHETER INTO RIGHT NECK;  Surgeon: Gretta Lonni PARAS, MD;  Location: [facility] OR;  Service: Vascular;  Laterality: Right;   IR DIALY SHUNT INTRO NEEDLE/INTRACATH INITIAL W/IMG LEFT Left 02/01/2019   IR DIALY SHUNT INTRO NEEDLE/INTRACATH INITIAL W/IMG LEFT Left 06/05/2020   IR DIALY SHUNT INTRO NEEDLE/INTRACATH INITIAL W/IMG LEFT Left 05/09/2021   IR REMOVAL TUN CV CATH W/O FL  12/04/2022   IR THROMBECTOMY AV FISTULA W/THROMBOLYSIS/PTA INC/SHUNT/IMG LEFT Left 08/18/2021   IR US  GUIDE VASC ACCESS LEFT  08/18/2021   PERIPHERAL VASCULAR BALLOON ANGIOPLASTY Left 07/14/2021   Procedure: PERIPHERAL VASCULAR BALLOON ANGIOPLASTY;  Surgeon: Sheree Penne Lonni, MD;  Location: [facility] INVASIVE CV LAB;  Service: Cardiovascular;  Laterality: Left;   PERIPHERAL VASCULAR CATHETERIZATION Left 12/09/2016   Procedure: A/V Fistulagram;  Surgeon: Penne Lonni Sheree, MD;  Location: [facility] INVASIVE CV LAB;  Service: Cardiovascular;  Laterality: Left;   REVISON OF ARTERIOVENOUS FISTULA Left 10/28/2015   Procedure: PLICATION OF BRACHIOCEPHALIC FISTULA;  Surgeon: Redell LITTIE Door, MD;  Location: [facility] OR;  Service: Vascular;  Laterality: Left;   REVISON OF ARTERIOVENOUS FISTULA Left 08/09/2017   Procedure: REVISION OF ARTERIOVENOUS FISTULA LIGATION OF SIDE BRANCH;  Surgeon: Sheree Penne Lonni, MD;  Location: [facility] OR;  Service: Vascular;  Laterality: Left;   TUBAL LIGATION  2004    ROS: Review of Systems Negative except as stated above  PHYSICAL EXAM: BP 129/85 (BP Location: Left Leg,  Patient Position: Sitting, Cuff Size: Normal)   Pulse 69   Wt 108 lb (49 kg)   LMP 02/11/2012 Comment: tubal ligation  SpO2 99%   BMI 21.81 kg/m   Wt Readings from Last 3 Encounters:  07/17/24 108 lb (49 kg)  01/14/24 108 lb (49 kg)  09/06/23 111 lb 3.2 oz (50.4 kg)    Physical Exam  General appearance - alert, well appearing, older Hispanic female and in no distress Mental status - normal mood, behavior, speech, dress, motor activity, and thought processes Chest - clear to auscultation, no wheezes, rales or rhonchi, symmetric air entry Heart - normal rate, regular rhythm, normal S1, S2, no murmurs, rubs, clicks or gallops Musculoskeletal -mild tenderness on palpation of the thoracic spine between the shoulder blades.  Mild tenderness on palpation of paraspinal muscle below the left shoulder blade Extremities: She has venous graft in both upper arms.     Latest Ref Rng & Units 06/12/2022   11:06 AM 03/09/2022    9:35 AM 11/11/2021    4:48 PM  CMP  Glucose 70 - 99 mg/dL 80  84  75   BUN 6 - 20 mg/dL 28  59  20   Creatinine 0.44 - 1.00 mg/dL 3.49  1.79  5.92   Sodium 135 - 145 mmol/L 137  137  134   Potassium 3.5 - 5.1 mmol/L 4.0  4.3  3.0   Chloride 98 - 111 mmol/L 96  96  91   CO2 20 - 29 mmol/L  23  25   Calcium 8.7 - 10.2 mg/dL  9.8  9.7    Lipid Panel     Component Value Date/Time   CHOL 195 03/12/2021 0954   TRIG 109 03/12/2021 0954   HDL 65 03/12/2021 0954   CHOLHDL 3.0 03/12/2021 0954   CHOLHDL 2.8 Ratio 10/22/2010 2104   VLDL 30 10/22/2010  2104   LDLCALC 111 (H) 03/12/2021 0954    CBC    Component Value Date/Time   WBC 7.5 11/11/2021 1648   RBC 4.31 11/11/2021 1648   HGB 12.2 06/12/2022 1106   HGB 10.1 (L) 03/12/2021 0954   HCT 36.0 06/12/2022 1106   HCT 30.6 (L) 03/12/2021 0954   PLT 174 11/11/2021 1648   PLT 232 03/12/2021 0954   MCV 87.9 11/11/2021 1648   MCV 96 03/12/2021 0954   MCH 30.2 11/11/2021 1648   MCHC 34.3 11/11/2021 1648   RDW 13.7  11/11/2021 1648   RDW 14.3 03/12/2021 0954   LYMPHSABS 1.1 08/17/2021 1243   LYMPHSABS 1.1 03/12/2021 0954   MONOABS 0.3 08/17/2021 1243   EOSABS 0.0 08/17/2021 1243   EOSABS 0.1 03/12/2021 0954   BASOSABS 0.0 08/17/2021 1243   BASOSABS 0.0 03/12/2021 0954    ASSESSMENT AND PLAN: 1. 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) (Primary) Diastolic blood pressure mildly elevated today.  She has not taken amlodipine  as yet for today.  We will have her continue the amlodipine  5 mg and carvedilol  12.5 mg twice a day.  2. ESRD on dialysis [facility]) Patient will continue going to dialysis on Tuesdays, Thursdays and Saturdays  3. Chronic bilateral thoracic back pain I think the pain under the left shoulder blade is slightly myofascial pain.  However she also has some pain in the midline so we will get x-ray of the thoracic spine. Recommend using some IcyHot over-the-counter and a heating pad. - DG Thoracic Spine W/Swimmers; Future  4. Rectal pain Patient requested refill on lidocaine  ointment - lidocaine  (XYLOCAINE ) 5 % ointment; Apply 1 Application topically as needed.  Dispense: 35.44 g; Refill: 0   Patient was given the opportunity to ask questions.  Patient verbalized understanding of the plan and was able to repeat key elements of the plan.   This documentation was completed using Paediatric nurse.  Any transcriptional errors are unintentional.  No orders of the defined types were placed in this encounter.    Requested Prescriptions    No prescriptions requested or ordered in this encounter    No follow-ups on file.  Barnie Louder, MD, FACP

## 2024-07-17 NOTE — Telephone Encounter (Signed)
 Transportation Voucher completed.   Pick up date and time: 07/17/2024 3:40 pm.  Pick up address: [address] [employer] [address] [address]  Drop off address: [address] [place], [place] [address]

## 2024-07-17 NOTE — Telephone Encounter (Signed)
 Transportation Voucher completed.   Pick up date and time: 07/17/2024 1:50 pm.  Pick up address: [address] [place], [place] [address]  Drop off address: [address] [place], [address]

## 2024-07-17 NOTE — Telephone Encounter (Signed)
 Copied from CRM [id number]. Topic: General - Transportation >> Jul 14, 2024  5:03 PM Delon DASEN wrote:  Reason for CRM: call to schedule transport for Monday 7/21 [phone]

## 2024-07-24 ENCOUNTER — Other Ambulatory Visit: Payer: Self-pay

## 2024-07-28 ENCOUNTER — Other Ambulatory Visit: Payer: Self-pay

## 2024-08-04 NOTE — Progress Notes (Signed)
 Office Visit Note  Patient: Sabrina Mitchell             Date of Birth: 12-23-1961           MRN: [id number]             PCP: Vicci Barnie NOVAK, MD Referring: Macel Jayson PARAS, MD Visit Date: 08/16/2024 Occupation: @GUAROCC @  Interpreter: Tenny Mattocks with [employer]   Subjective:  Pain in multiple joints  History of Present Illness: Sabrina Mitchell is a 63 y.o. female seen for the evaluation of pain in multiple joints.  Patient states she has been having pain and discomfort in multiple joints over the last several years.  She complains of discomfort in the bilateral hands for the last 4 to 5 years.  She has noticed some deformities in her hands.  She also has discomfort in the upper back and lower back.  She states she has some discomfort in her elbows when she is resting on the dialysis chair.  She complains of discomfort in her knees.  She has not noticed any warmth or swelling in her joints.  There is no history of oral ulcers, nasal ulcers, malar rash, photosensitivity, Raynaud's or lymphadenopathy.  There is no family history of autoimmune disease.  She is right-handed unemployed.  She is single, gravida 5, para 2, miscarriages 3, history of preeclampsia.  She is postmenopausal.  She drinks alcohol only occasionally.  She is non-smoker.    Activities of Daily Living:  Patient reports morning stiffness for all day. Patient Reports nocturnal pain.  Difficulty dressing/grooming: Reports Difficulty climbing stairs: Reports Difficulty getting out of chair: Reports Difficulty using hands for taps, buttons, cutlery, and/or writing: Reports  Review of Systems  Constitutional:  Positive for fatigue.  HENT:  Negative for mouth sores and mouth dryness.   Eyes:  Positive for visual disturbance. Negative for dryness.  Respiratory:  Negative for shortness of breath.   Cardiovascular:  Positive for chest pain. Negative for palpitations.  Gastrointestinal:  Negative for blood in  stool, constipation and diarrhea.  Endocrine: Negative for increased urination.  Genitourinary:  Negative for involuntary urination.  Musculoskeletal:  Positive for joint pain, gait problem, joint pain, joint swelling, myalgias, muscle weakness, morning stiffness, muscle tenderness and myalgias.  Skin:  Positive for hair loss. Negative for color change, rash and sensitivity to sunlight.  Allergic/Immunologic: Positive for susceptible to infections.  Neurological:  Positive for dizziness. Negative for headaches.  Hematological:  Negative for swollen glands.  Psychiatric/Behavioral:  Positive for depressed mood and sleep disturbance. The patient is not nervous/anxious.     PMFS History:  Patient Active Problem List   Diagnosis Date Noted   Psychophysiological insomnia 02/13/2021   New onset of headaches 02/13/2021   Depression with anxiety 10/30/2020   Pulmonary edema 09/26/2019   Chest pain 09/26/2019   Elevated troponin 09/26/2019   Shortness of breath 05/16/2019   Calcaneal spur of right foot 10/10/2018   Ulnar neuropathy of left upper extremity 03/11/2018   GERD (gastroesophageal reflux disease) 01/17/2016   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HCC) 10/23/2015   Dependence on renal dialysis (HCC) 10/15/2015   Pruritus, unspecified 12/26/2014   Diarrhea, unspecified 09/13/2014   Fever, unspecified 08/30/2014   Pain, unspecified 08/30/2014   Generalized abdominal pain 04/05/2013   Post-menopausal bleeding 04/06/2012   End stage renal disease on dialysis (HCC) 04/06/2012   Hypertension 04/06/2012   Anemia in chronic kidney disease 03/10/2012   Secondary hyperparathyroidism of renal origin (  HCC) 03/10/2012   Iron deficiency anemia, unspecified 01/23/2012   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) 03/25/2010    Past Medical History:  Diagnosis Date   Anemia    Anxiety    Complication of anesthesia     Depression    denies   End stage renal disease (HCC)    hemodialysis 3x/wk @ [address]   Family history of adverse reaction to anesthesia    sister vomiting after uterine cyst removal   Gastritis    GERD (gastroesophageal reflux disease)    Hemorrhoids    Hypertension    Pneumonia    years ago   PONV (postoperative nausea and vomiting)    UTI (lower urinary tract infection) March 2014    Family History  Problem Relation Age of Onset   Hypertension Mother    Hypertension Father    Hypertension Sister    Breast cancer Sister 24   Diabetes Sister    Hypertension Sister    Hypertension Sister    Diabetes Brother    Hypertension Brother    Cirrhosis Brother    Other Brother        COVID-19   Asthma Son    Thyroid disease Daughter    Past Surgical History:  Procedure Laterality Date   A/V FISTULAGRAM Right 06/12/2022   Procedure: A/V Fistulagram;  Surgeon: Eliza Lonni RAMAN, MD;  Location: [facility] INVASIVE CV LAB;  Service: Cardiovascular;  Laterality: Right;   AV FISTULA PLACEMENT Left 07/05/09   Dr. Gerlean   AV FISTULA PLACEMENT Right 09/26/2021   Procedure: RIGHT ARM ARTERIOVENOUS (AV) FISTULA CREATION;  Surgeon: Sheree Penne Lonni, MD;  Location: [facility] OR;  Service: Vascular;  Laterality: Right;   COLONOSCOPY N/A 02/10/2016   SLF: 1. HEME postive stool due to colon polyps intrernal hemorrhoids 2. small internal hemrrohoids 3. moderate external hemorrhoids.    ESOPHAGEAL DILATION N/A 02/10/2016   Procedure: ESOPHAGEAL DILATION;  Surgeon: Margo LITTIE Haddock, MD;  Location: [facility] ENDO SUITE;  Service: Endoscopy;  Laterality: N/A;   ESOPHAGOGASTRODUODENOSCOPY N/A 02/10/2016   SLF: patent Schatzki's ring , mild gastritis/ duodentitis.    FISTULA SUPERFICIALIZATION Left 11/04/2018   Procedure: PLICATION BRACHIOCEPHALIC FISTULA;  Surgeon: Eliza Lonni RAMAN, MD;  Location: [facility] OR;  Service: Vascular;  Laterality: Left;   FISTULOGRAM N/A 10/22/2014   Procedure: FISTULOGRAM;   Surgeon: Lonni RAMAN Eliza, MD;  Location: [facility] CATH LAB;  Service: Cardiovascular;  Laterality: N/A;   INSERTION OF DIALYSIS CATHETER Right 08/27/2021   Procedure: INSERTION OF 19cm TUNNELED DIALYSIS CATHETER INTO RIGHT NECK;  Surgeon: Gretta Lonni PARAS, MD;  Location: [facility] OR;  Service: Vascular;  Laterality: Right;   IR DIALY SHUNT INTRO NEEDLE/INTRACATH INITIAL W/IMG LEFT Left 02/01/2019   IR DIALY SHUNT INTRO NEEDLE/INTRACATH INITIAL W/IMG LEFT Left 06/05/2020   IR DIALY SHUNT INTRO NEEDLE/INTRACATH INITIAL W/IMG LEFT Left 05/09/2021   IR REMOVAL TUN CV CATH W/O FL  12/04/2022   IR THROMBECTOMY AV FISTULA W/THROMBOLYSIS/PTA INC/SHUNT/IMG LEFT Left 08/18/2021   IR US  GUIDE VASC ACCESS LEFT  08/18/2021   PERIPHERAL VASCULAR BALLOON ANGIOPLASTY Left 07/14/2021   Procedure: PERIPHERAL VASCULAR BALLOON ANGIOPLASTY;  Surgeon: Sheree Penne Lonni, MD;  Location: [facility] INVASIVE CV LAB;  Service: Cardiovascular;  Laterality: Left;   PERIPHERAL VASCULAR CATHETERIZATION Left 12/09/2016   Procedure: A/V Fistulagram;  Surgeon: Penne Lonni Sheree, MD;  Location: [facility] INVASIVE CV LAB;  Service: Cardiovascular;  Laterality: Left;   REVISON OF  ARTERIOVENOUS FISTULA Left 10/28/2015   Procedure: PLICATION OF BRACHIOCEPHALIC FISTULA;  Surgeon: Redell LITTIE Door, MD;  Location: [facility] OR;  Service: Vascular;  Laterality: Left;   REVISON OF ARTERIOVENOUS FISTULA Left 08/09/2017   Procedure: REVISION OF ARTERIOVENOUS FISTULA LIGATION OF SIDE BRANCH;  Surgeon: Sheree Penne Bruckner, MD;  Location: [facility] OR;  Service: Vascular;  Laterality: Left;   TUBAL LIGATION  2004   Social History   Social History Narrative   Not on file   Immunization History  Administered Date(s) Administered   Influenza,inj,Quad PF,6+ Mos 09/30/2019, 09/30/2019, 10/26/2020, 10/14/2021   PFIZER(Purple Top)SARS-COV-2 Vaccination 03/12/2020   PNEUMOCOCCAL CONJUGATE-20 06/06/2021   Pneumococcal Conjugate-13 09/19/2013   Pneumococcal  Polysaccharide-23 03/16/2017   Tdap 12/10/2017   Zoster Recombinant(Shingrix) 06/20/2021, 10/06/2021     Objective: Vital Signs: BP 138/89 (BP Location: Left Leg, Patient Position: Sitting, Cuff Size: Normal)   Pulse 75   Resp 12   Ht 4' 9.5 (1.461 m)   Wt 106 lb 12.8 oz (48.4 kg)   LMP 02/11/2012 Comment: tubal ligation  BMI 22.71 kg/m    Physical Exam Vitals and nursing note reviewed.  Constitutional:      Appearance: She is well-developed.  HENT:     Head: Normocephalic and atraumatic.  Eyes:     Conjunctiva/sclera: Conjunctivae normal.  Cardiovascular:     Rate and Rhythm: Normal rate and regular rhythm.     Heart sounds: Normal heart sounds.  Pulmonary:     Effort: Pulmonary effort is normal.     Breath sounds: Normal breath sounds.  Abdominal:     General: Bowel sounds are normal.     Palpations: Abdomen is soft.  Musculoskeletal:     Cervical back: Normal range of motion.  Lymphadenopathy:     Cervical: No cervical adenopathy.  Skin:    General: Skin is warm and dry.     Capillary Refill: Capillary refill takes less than 2 seconds.  Neurological:     Mental Status: She is alert and oriented to person, place, and time.  Psychiatric:        Behavior: Behavior normal.      Musculoskeletal Exam: Cervical spine was in good range of motion.  Thoracic kyphosis was noted.  She had mild tenderness over the paravertebral region in the thoracic and lumbar region.  She had no point tenderness over thoracic or lumbar spine.  No SI joint tenderness was noted.  Shoulders, elbows, wrist joints, MCPs PIPs were in good range of motion.  She has subluxation of right 3rd and 4th finger DIP joints.  Bilateral DIP thickening with no synovitis was noted.  Hip joints and knee joints with good range of motion.  She complains of discomfort in her knee joints.  No warmth swelling or effusion was noted.  There was no tenderness over her ankles or MTPs.  CDAI Exam: CDAI Score: -- Patient  Global: --; Provider Global: -- Swollen: --; Tender: -- Joint Exam 08/16/2024   No joint exam has been documented for this visit   There is currently no information documented on the homunculus. Go to the Rheumatology activity and complete the homunculus joint exam.  Investigation: No additional findings.  Imaging: No results found.  Recent Labs: Lab Results  Component Value Date   WBC 7.5 11/11/2021   HGB 12.2 06/12/2022   PLT 174 11/11/2021   NA 137 06/12/2022   K 4.0 06/12/2022   CL 96 (L) 06/12/2022   CO2 23 03/09/2022   GLUCOSE 80  06/12/2022   BUN 28 (H) 06/12/2022   CREATININE 6.50 (H) 06/12/2022   BILITOT 0.6 09/26/2019   ALKPHOS 88 09/26/2019   AST 39 09/26/2019   ALT 33 09/26/2019   PROT 6.9 09/26/2019   ALBUMIN 3.4 (L) 09/27/2019   CALCIUM 9.8 03/09/2022   GFRAA 7 (L) 03/27/2020    Speciality Comments: No specialty comments available.  Procedures:  No procedures performed Allergies: Covid-19 (mrna) vaccine, Morphine  and codeine , Remeron  [mirtazapine ], Zoloft  [sertraline ], Elavil  [amitriptyline ], and Trazodone  and nefazodone   Assessment / Plan:     Visit Diagnoses: Polyarthralgia -patient complains of pain and discomfort in multiple joints.  There is no history of joint swelling.  No synovitis was noted.  Bilateral hand pain -she complains of discomfort in the bilateral hands for the last 4 to 5 years.  Bilateral DIP thickening and subluxation of some of the DIP joints was noted as described above.  No synovitis was noted.  I will obtain x-rays of her bilateral hands.  Plan: DG Hand 2 View Left, DG Hand 2 View Right.  Clinical findings were suggestive of osteoarthritis.  Detail counseled regarding osteoarthritis was provided.  A handout on osteoarthritis was given in Spanish.  Joint protection muscle strengthening was discussed.  Chronic pain of both knees -she complains of discomfort in bilateral knee joints.  No warmth swelling or effusion was noted.  A  handout on lower extremity muscle strengthening signs was given.  I will obtain x-rays of the bilateral knee joints.  Plan: DG Knee 3 Views Right, DG Knee 3 Views Left  Pain in thoracic spine -she complains of a lot of discomfort in the thoracic region.  Thoracic kyphosis was noted.  I will obtain x-rays of the thoracic spine.  Patient declined physical therapy.  A handout on back exercises was given.  Plan: DG Thoracic Spine 2 View  Low back pain, unspecified back pain laterality, unspecified chronicity, unspecified whether sciatica present -she complains of discomfort in her lower back for many years.  She had no point tenderness.  I will obtain x-rays of the lumbar spine.  A handout on back exercises was given.  Plan: DG Lumbar Spine 2-3 Views  Ulnar neuropathy of left upper extremity-she relates discomfort from leaning a dialysis chair.  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  Orders: Orders Placed This Encounter  Procedures   DG Hand 2 View Left   DG Hand 2 View Right   DG Thoracic Spine 2 View   DG Lumbar Spine 2-3 Views   DG Knee 3 Views Right   DG Knee 3 Views Left   No orders of the defined types were placed in this encounter.    Follow-Up Instructions: Return if symptoms worsen or fail to improve, for Osteoarthritis.   Maya Nash, MD  Note - This record has been created using [employer].  Chart creation errors have been sought, but may not always  have been located. Such creation errors do not reflect on  the standard of medical care.

## 2024-08-16 ENCOUNTER — Ambulatory Visit: Payer: Self-pay | Attending: Rheumatology | Admitting: Rheumatology

## 2024-08-16 ENCOUNTER — Encounter: Payer: Self-pay | Admitting: Rheumatology

## 2024-08-16 VITALS — BP 138/89 | HR 75 | Resp 12 | Ht <= 58 in | Wt 106.8 lb

## 2024-08-16 DIAGNOSIS — Z8709 Personal history of other diseases of the respiratory system: Secondary | ICD-10-CM

## 2024-08-16 DIAGNOSIS — F418 Other specified anxiety disorders: Secondary | ICD-10-CM

## 2024-08-16 DIAGNOSIS — D631 Anemia in chronic kidney disease: Secondary | ICD-10-CM

## 2024-08-16 DIAGNOSIS — G5622 Lesion of ulnar nerve, left upper limb: Secondary | ICD-10-CM

## 2024-08-16 DIAGNOSIS — Z603 Acculturation difficulty: Secondary | ICD-10-CM

## 2024-08-16 DIAGNOSIS — Z758 Other problems related to medical facilities and other health care: Secondary | ICD-10-CM

## 2024-08-16 DIAGNOSIS — M545 Low back pain, unspecified: Secondary | ICD-10-CM

## 2024-08-16 DIAGNOSIS — M546 Pain in thoracic spine: Secondary | ICD-10-CM

## 2024-08-16 DIAGNOSIS — N186 End stage renal disease: Secondary | ICD-10-CM

## 2024-08-16 DIAGNOSIS — T82510D Breakdown (mechanical) of surgically created arteriovenous fistula, subsequent encounter: Secondary | ICD-10-CM

## 2024-08-16 DIAGNOSIS — M79641 Pain in right hand: Secondary | ICD-10-CM

## 2024-08-16 DIAGNOSIS — I10 Essential (primary) hypertension: Secondary | ICD-10-CM

## 2024-08-16 DIAGNOSIS — M25562 Pain in left knee: Secondary | ICD-10-CM

## 2024-08-16 DIAGNOSIS — N2581 Secondary hyperparathyroidism of renal origin: Secondary | ICD-10-CM

## 2024-08-16 DIAGNOSIS — F5104 Psychophysiologic insomnia: Secondary | ICD-10-CM

## 2024-08-16 DIAGNOSIS — M79642 Pain in left hand: Secondary | ICD-10-CM

## 2024-08-16 DIAGNOSIS — G8929 Other chronic pain: Secondary | ICD-10-CM

## 2024-08-16 DIAGNOSIS — I12 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 disease: Secondary | ICD-10-CM

## 2024-08-16 DIAGNOSIS — M2550 Pain in unspecified joint: Secondary | ICD-10-CM

## 2024-08-16 DIAGNOSIS — M1990 Unspecified osteoarthritis, unspecified site: Secondary | ICD-10-CM

## 2024-08-16 DIAGNOSIS — Z992 Dependence on renal dialysis: Secondary | ICD-10-CM

## 2024-08-16 DIAGNOSIS — M25561 Pain in right knee: Secondary | ICD-10-CM

## 2024-08-16 DIAGNOSIS — K219 Gastro-esophageal reflux disease without esophagitis: Secondary | ICD-10-CM

## 2024-08-16 NOTE — Patient Instructions (Signed)
 Artrosis Osteoarthritis  La artrosis es un tipo de artritis. Esta afeccin produce dolor o enfermedad en las articulaciones. La artrosis afecta al tejido que cubre los extremos de los huesos en las articulaciones (cartlago). El cartlago acta como amortiguador entre los huesos y los ayuda a moverse con suavidad. La artrosis se presenta cuando el cartlago de las articulaciones se gasta. A veces, la artrosis se denomina artritis "por uso y desgaste". La artrosis es la forma ms frecuente de artritis. A menudo, afecta a las [employer] International. Es una afeccin que empeora con el Durant. Las articulaciones afectadas con mayor frecuencia por esta afeccin se encuentran en los dedos de Washington Mutual, los dedos de RadioShack, las caderas, las rodillas y la columna vertebral, incluyendo el cuello y la parte inferior de la espalda. Cules son las causas? Esta afeccin es causada por el desgaste del cartlago que cubre los extremos de [place]. Qu incrementa el riesgo? Los siguientes factores pueden hacer que sea ms propenso a Aeronautical engineer afeccin: Ser mayor de 50 aos. Obesidad. Uso excesivo de la articulacin. Lesin pasada de una articulacin. Ciruga pasada en una articulacin. Antecedentes familiares de artrosis. Cules son los signos o sntomas? Los principales sntomas de esta enfermedad son dolor, hinchazn y rigidez en la articulacin. Otros sntomas pueden incluir: Agrandamiento de la articulacin. Aumento del dolor y dao adicional causado por pequeos trozos de Dow Chemical o TEFL teacher que se desprenden y flotan dentro de la articulacin. Formacin de pequeos depsitos de hueso (osteofitos) en los extremos de la articulacin. Una sensacin de chirrido o raspado dentro de la articulacin al moverla. Sonidos de chasquido o crujido al [employer]ox Company. Dificultad para caminar o hacer ejercicio. Incapacidad para agarrar objetos, girar la mano o controlar los movimientos de las manos y los dedos. Cmo  se diagnostica? Esta afeccin se puede diagnosticar en funcin de lo siguiente: Sus antecedentes mdicos. Un examen fsico. Los sntomas. Radiografas de las articulaciones afectadas. Anlisis de sangre para descartar otros tipos de artritis. Cmo se trata? No hay cura para esta enfermedad, pero el tratamiento puede ayudar a Human resources officer y Scientist, clinical (histocompatibility and immunogenetics) el funcionamiento de la articulacin. El tratamiento puede incluir una combinacin de terapias, [place] las siguientes: Tcnicas de alivio del dolor, como: Aplicacin de calor y fro en la articulacin. Masajes. Una forma de psicoterapia llamada terapia cognitivo conductual (TCC). Esta terapia le ayuda a Consulting civil engineer y a Education officer, environmental un seguimiento de los cambios que hace. Analgsicos y antiinflamatorios. Los medicamentos pueden tomarse por boca o [employer]TEL Corporation. Incluyen los siguientes: Antiinflamatorios no esteroideos (AINE), como el ibuprofeno. Medicamentos recetados. Antiinflamatorios fuertes (corticoesteroides). Ciertos suplementos nutricionales. Un programa de ejercicios recomendado. Puede trabajar con un fisioterapeuta. Dispositivos de ayuda, como un dispositivo ortopdico, una frula, un guante especial o un bastn. Un plan de control del peso. [place], como: Ignacia coral. Se hace para volver a posicionar los huesos y Engineer, materials o para Oceanographer los trozos sueltos de hueso y TEFL teacher. Ciruga de reemplazo articular. Es posible que necesite esta ciruga si tiene una artrosis [place]. Siga estas indicaciones en su casa: Actividad Descanse las articulaciones afectadas como se lo haya indicado el mdico. Haga actividad fsica como se lo haya indicado el mdico. El mdico puede recomendar tipos especficos de ejercicios, por ejemplo: Ejercicios de fortalecimiento. Se realizan para fortalecer los msculos que sostienen las articulaciones afectadas por la artritis. Ejercicios aerbicos. Son ejercicios, como caminar a  paso ligero o hacer gimnasia aerbica acutica, que aumentan la frecuencia cardaca. Actividades de  amplitud de movimientos. Estos ayudan a que las articulaciones se muevan con ms facilidad. Ejercicios de equilibrio y russian federation. Control del dolor, la rigidez y la hinchazn     Si se lo indican, aplique calor en la zona afectada con la frecuencia que le haya dicho el mdico. Use la fuente de calor que el mdico le recomiende, como una compresa de calor hmedo o una almohadilla trmica. Si tiene un dispositivo de Peter Kiewit Sons se puede quitar, quteselo segn lo indicado por su mdico. Coloque una toalla entre la piel y la fuente de calor. Si el mdico le indica que no se quite el dispositivo de USAA se Tax inspector, coloque una toalla entre el dispositivo de ayuda y la fuente de calor. Aplique calor durante 20 a 30 minutos. Si se lo indican, aplique hielo en la zona afectada. Si tiene un dispositivo de ayuda que se puede quitar, quteselo segn lo indicado por su mdico. Ponga el hielo en una bolsa plstica. Coloque una toalla entre la piel y la bolsa. Si el mdico le indica que no se quite el dispositivo de USAA se aplica hielo, coloque una toalla entre el dispositivo de ayuda y la bolsa de hielo. Aplique el hielo durante 20 minutos, 2 o 3 veces por da. Si la piel se le pone de color rojo brillante, retire el hielo o Company secretary de inmediato para evitar daos en la piel. El [place] de dao es mayor si no puede sentir dolor, Airline pilot o fro. Mueva los dedos de las manos o de los pies con frecuencia para reducir la rigidez y la hinchazn. Cuando est sentado o acostado, levante (eleve) la zona afectada por encima del nivel del corazn. Indicaciones generales Use los medicamentos de venta libre y los recetados solamente como se lo haya indicado el mdico. Mantenga un peso saludable. Siga las instrucciones del mdico con respecto al control del [place]. No consuma ningn producto que  contenga nicotina o tabaco. Estos productos incluyen cigarrillos, tabaco para Theatre manager y aparatos de vapeo, como los cigarrillos electrnicos. Si necesita ayuda para dejar de consumir estos productos, consulte al American Express. Use los dispositivos de ayuda como se lo haya indicado el mdico. Dnde buscar ms informacin [employer] Mills of Arthritis and Musculoskeletal and Skin Diseases (Instituto [employer] de Artritis y [place] Musculoesquelticas y Arboriculturist): niams.[email or web address].net/ [employer] Mills on Aging Lawyer sobre el Envejecimiento): BaseRingTones.pl Celanese Corporation of Rheumatology (Instituto Estadounidense de Reumatologa): rheumatology.org Comunquese con un mdico si: Tiene enrojecimiento, hinchazn o sensacin de calor que empeora en una articulacin. Tiene fiebre y siente dolor en la articulacin o el msculo. Presenta una erupcin cutnea. Tiene dificultad para X[employer] Energy cotidianas. Siente dolor que empeora y no se alivia con los analgsicos. Esta informacin no tiene Theme park manager el consejo del mdico. Asegrese de hacerle al mdico cualquier pregunta que tenga. Document Revised: 09/15/2022 Document Reviewed: 09/15/2022 Elsevier Patient Education  2024 Elsevier Inc.   Ejercicios de Gaffer Los ejercicios de manos pueden ser tiles para casi cualquier persona. Pueden fortalecer las manos y mejorar el movimiento y la flexibilidad. Los ejercicios tambin pueden aumentar el flujo sanguneo a las manos. Estos resultados [employer] Hughes Incorporated sea ms fcil encargarse del trabajo y las tareas diarias. Los ejercicios de manos pueden ser especialmente provechosos para la gente que tiene dolor articular provocado por la artritis o tienen dao nervioso por usar las manos continuamente. Estos ejercicios tambin pueden ayudar a las personas que se lesionan [place]. Ejercicios La  mayor parte de estos ejercicios de manos son suaves ejercicios de  elongacin y [place]. Generalmente es seguro hacerlos durante todo Medical laboratory scientific officer. Calentarse las manos antes del ejercicio puede ayudar a reducir la rigidez. Puede hacer esto con un masaje suave o sumergiendo las manos en agua tibia durante 10 a 15 minutos. Es normal sentir Science writer, tironeo, opresin o un leve malestar cuando comienza a Biochemist, clinical. Con el tiempo, esto mejorar. Recuerde siempre tener cuidado y detenerse de inmediato si siente un dolor repentino y muy intenso o si el dolor empeora. Usted debe mejorar y [employer]illard[employer]. Pregunte al mdico qu ejercicios son seguros para usted. Haga los ejercicios exactamente como se lo haya indicado el mdico y gradelos como se lo hayan indicado. No comience a hacer estos ejercicios hasta que se lo indique el mdico. Flexin de nudillos o puo "garra"  Prese o sintese con el brazo, la mano y los cinco dedos apuntando malta. Asegrese de FedEx en posicin recta. Flexione suavemente los dedos hacia abajo, hacia la palma de la mano, hasta que las puntas de los dedos toquen la palma de la [place]. Mantenga el nudillo grande estirado y solamente flexione los nudillos pequeos en los dedos. Mantenga esta posicin durante 10 segundos. Estire los dedos nuevamente hasta la posicin inicial. Repita este ejercicio entre 5 y 10 veces con cada mano. Puo con todo el dedo  Prese o sintese con el brazo, la mano y los cinco dedos apuntando malta. Asegrese de FedEx en posicin recta. Flexione suavemente los dedos hacia la palma de la mano, hasta que las puntas de los dedos toquen la parte media de la palma de la [place]. Mantenga esta posicin durante 10 segundos. Extienda los dedos nuevamente hasta la posicin inicial, estirando completamente cada articulacin. Repita este ejercicio entre 5 y 10 veces con cada mano. Puo extendido  Prese o sintese con el brazo, la mano y los cinco dedos apuntando  malta. Asegrese de FedEx en posicin recta. Flexione suavemente los dedos en el nudillo grande, donde los dedos se unen a la [place], y en el [place]. Mantenga el nudillo de la punta de los dedos estirado y trate de tocarse la parte inferior de la palma de la [place]. Mantenga esta posicin durante 10 segundos. Extienda los dedos nuevamente hasta la posicin inicial, estirando completamente cada articulacin. Repita este ejercicio entre 5 y 10 veces con cada mano. La mesa  Prese o sintese con el brazo, la mano y los cinco dedos apuntando malta. Asegrese de FedEx en posicin recta. Doble suavemente los dedos en el nudillo ms grande, donde los dedos se unen a la mano, lo ms lejos que pueda hacia abajo. Mantenga estirados los nudillos pequeos de los dedos. Piense en formar una mesa con los dedos. Mantenga esta posicin durante 10 segundos. Extienda los dedos nuevamente hasta la posicin inicial, estirando completamente cada articulacin. Repita este ejercicio entre 5 y 10 veces con cada mano. Dispersin de dedos  Apoye la mano horizontal sobre una mesa con la palma hacia abajo. Asegrese de que la [place] se mantenga en posicin recta. Separe todos los dedos uno de otro tanto como pueda hasta sentir un ligero estiramiento. Mantenga esta posicin durante 10 segundos. Vuelva a juntar todos los dedos. Mantenga esta posicin durante 10 segundos. Repita este ejercicio entre 5 y 10 veces con cada mano. Hacer crculos  Prese o sintese con el brazo, la mano y los  cinco dedos apuntando malta. Asegrese de FedEx en posicin recta. Haga un crculo tocando la punta del pulgar con la punta del dedo ndice. Mantenga durante 10 segundos. Luego abra la mano completamente. Repita este movimiento con Multimedia programmer y cada uno de los otros dedos. Repita este ejercicio entre 5 y 10 veces con cada mano. Movimiento del pulgar  Sintese con el antebrazo  apoyado sobre una mesa y la [place]. El pulgar debe apuntar hacia arriba, hacia el techo. Mantenga los otros dedos relajados mientras mueve el pulgar. Levante el pulgar hacia arriba lo ms alto que pueda hacia el techo. Mantenga durante 10 segundos. Doble el pulgar por la palma lo ms lejos que pueda, tratando de llegar con la punta del pulgar hasta el lado del dedo pequeo (meique) de la palma de la [place]. Mantenga durante 10 segundos. Repita este ejercicio entre 5 y 10 veces con cada mano. Fortalecimiento del agarre  Mudlogger pelota para el estrs u otra pelota blanda en el medio de la [place]. Aumente lentamente la presin, apretando la pelota tanto como pueda sin causar dolor. Piense en llevar las puntas de los dedos hacia el centro de la palma. Todas las articulaciones de los dedos deben doblarse al hacer este ejercicio. Mantenga la presin durante 10 segundos y luego relaje. Repita este ejercicio entre 5 y 10 veces con cada mano. Comunquese con un mdico si: El dolor o las molestias en la mano se vuelven mucho ms intensas cuando hace un ejercicio. El dolor o las molestias en la mano no mejoran en el trmino de las 2 horas posteriores a Copy. Si tiene alguno de [employer] Brands, deje de [employer] Corporation ejercicios de inmediato. No vuelva a hacerlos a menos que el mdico lo autorice. Solicite ayuda de inmediato si: Presenta hinchazn o dolor sbito e intenso en la mano. Si esto ocurre, deje de Museum/gallery exhibitions officer ejercicios de inmediato. No vuelva a hacerlos a menos que el mdico lo autorice. Esta informacin no tiene Theme park manager el consejo del mdico. Asegrese de hacerle al mdico cualquier pregunta que tenga. Document Revised: 01/23/2023 Document Reviewed: 01/23/2023 Elsevier Patient Education  2024 Elsevier Inc.  Rehabilitacin de la distensin Retail buyer Strain Rehab Pregunte al mdico qu ejercicios son seguros para usted. Haga los ejercicios exactamente  como se lo haya dicho el mdico y gradelos como se lo haya indicado. Es normal sentir un leve estiramiento, tironeo, opresin o Dentist al Manpower Inc ejercicios. Detngase de inmediato si siente un dolor repentino o Community education officer. No comience a hacer estos ejercicios hasta que se lo indique el mdico. Ejercicio de elongacin y amplitud de movimiento Este ejercicio calienta los msculos y las articulaciones, y mejora la movilidad y la flexibilidad de la espalda y los hombros. Este ejercicio tambin ayuda a Engineer, materials. Estiramiento de pecho y columna  Acustese boca arriba sobre una superficie firme. Use una toalla o una manta pequea para armar un rollo de 4 pulgadas (10 cm) de dimetro. Coloque la toalla debajo de la parte media de la espalda de modo que quede debajo de la columna, pero no debajo de los omplatos. Coloque las manos detrs de la cabeza y deje caer los codos hacia los lados. Esto aumentar el estiramiento. Inspire profundamente (inhale). Mantenga esta posicin durante __________ segundos. Reljese despus de espirar (exhalar). Repita __________ veces. Realice este ejercicio __________ veces al da. Ejercicios de fortalecimiento Estos ejercicios fortalecen los msculos de la espalda y los omplatos,  y les otorgan resistencia. La resistencia es la capacidad de usar los msculos durante un tiempo prolongado, incluso despus de que se cansen. Elevaciones alternadas de pierna y brazo  Apoye las palmas de las manos y las rodillas sobre una superficie firme. Si est sobre un suelo duro, puede usar un elemento acolchado, como una alfombrilla para ejercicios, para apoyar las rodillas. Alinee los brazos y las piernas. Las manos deben estar justo debajo de los hombros, y las rodillas debajo de la cadera. Eleve la pierna [employer]. Al mismo tiempo, eleve el brazo derecho y Associate Professor frente a usted. No eleve la pierna por encima de la cadera. No eleve el brazo por encima  del hombro. Mantenga los msculos del abdomen y de la espalda contrados. Mantenga la cadera mirando hacia el suelo. No arquee la espalda. Mantenga el equilibrio con cuidado. No contenga la respiracin. Mantenga esta posicin durante __________ segundos. Lentamente regrese a la posicin inicial y repita el ejercicio con la pierna derecha y el brazo izquierdo. Repita __________ veces. Realice este ejercicio __________ veces al da. Remo con los brazos extendidos Este ejercicio tambin se denomina ejercicio de extensin de hombros. Prese con los pies separados a la distancia de los hombros. Ate una banda para ejercicios a un objeto estable que est frente a usted, de modo que la banda est por encima de la altura del hombro o en el mismo nivel. Sostenga un extremo de la banda para ejercicios en cada mano. Extienda los codos y [employer] a la altura de los hombros. Camine hacia atrs, alejndose del extremo fijo de la banda para ejercicios hasta que esta se tense. Junte los omplatos y NIKE costados de los muslos. Detngase cuando las manos estn en la misma posicin en ambos costados. Esto es la extensin de los hombros. No deje que las manos vayan hacia atrs del cuerpo. Mantenga esta posicin durante __________ segundos. Vuelva lentamente a la posicin inicial. Repita __________ veces. Realice este ejercicio __________ veces al da. Remo con rotacin escapular En este ejercicio, los omplatos (escpulas) se acercan entre s (retraccin). Sintese en una silla estable que no tenga apoyabrazos o pngase de pie. Ate una banda para ejercicios a un objeto estable que est frente a usted, de modo que la banda est a la altura del hombro. Sostenga un extremo de la banda para ejercicios en cada mano. Las palmas deben estar enfrentadas. Lleve los brazos extendidos adelante. Camine hacia atrs, alejndose del extremo fijo de la banda para ejercicios hasta que esta se  tense. Tire la Cal[employer] Corporation. Mientras hace esto, flexione los codos y Apple Computer, pero evite mover el resto del cuerpo. No encoja los hombros hacia arriba al hacerlo. Detngase cuando los codos estn a los lados o ligeramente detrs del cuerpo. Mantenga esta posicin durante __________ segundos. Extienda lentamente los brazos para regresar a la posicin inicial. Repita __________ [employer]. Realice este ejercicio __________ veces al da. Postura y mecnica corporal La buena postura y la mecnica corporal saludable pueden ayudar a Acupuncturist estrs en las articulaciones y los tejidos del cuerpo. La Water quality scientist se refiere a los movimientos y a las posiciones del cuerpo mientras realiza las actividades diarias. La postura es una parte de la Water quality scientist. Ignacia buena postura significa que: La columna est en su posicin natural de curvatura en S (neutral). Los hombros estn [employer]. La cabeza no est inclinada hacia adelante. Siga esas pautas para mejorar la postura  y Regulatory affairs officer en sus actividades diarias. De pie  Al estar de pie, mantenga la columna en la posicin neutral y los pies separados al ancho de caderas, aproximadamente. Mantenga las rodillas ligeramente flexionadas. Las [place], los hombros y la cadera deben estar alineados. Cuando realice una tarea en la que deba inclinarse hacia adelante y estar de pie en el mismo sitio durante mucho tiempo, coloque un pie en un objeto estable de 2 a 4 pulgadas (5 a 10 cm) de alto, como un taburete. Esto ayuda a que la columna mantenga una posicin neutral. Sentado  Cuando est sentado, mantenga la columna en posicin neutral y deje los pies apoyados en el suelo. Use un apoyapis si es necesario. Mantenga los muslos paralelos al suelo. Evite redondear los hombros e inclinar la cabeza hacia adelante. Cuando trabaje en un escritorio o con una computadora, el escritorio debe estar a una altura en la que las  manos estn un poco ms abajo que los codos. Deslice la silla debajo del escritorio, de modo de estar lo suficientemente cerca como para mantener una buena [place]. Cuando trabaje con una computadora, coloque el monitor a una altura que le permita mirar derecho hacia adelante, sin tener que inclinar la cabeza hacia adelante o [place] atrs. Reposo Al descansar o estar acostado, evite las posiciones que le causen ms dolor. Si siente dolor al hacer actividades que exigen sentarse, inclinarse, agacharse o ponerse en cuclillas (actividadesbasadas en la flexin), acustese en una posicin en la que el cuerpo no deba doblarse mucho. Por ejemplo, evite acurrucarse de costado con los brazos y las rodillas cerca del pecho (posicin fetal). Si siente dolor con las actividades que exigen estar de pie durante mucho tiempo o Furniture conservator/restorer los brazos ([phone] en la extensin), acustese con la columna en una posicin neutral y flexione ligeramente las rodillas. Pruebe con las siguientes posiciones: Acustese de costado con una almohada entre las rodillas. Acustese boca arriba con una almohada debajo de las rodillas.  Levantar objetos  Cuando tenga que levantar un objeto, mantenga los pies separados el ancho de los hombros, como [place], y apriete los msculos abdominales. Flexione las rodillas y la cadera, y mantenga la columna en posicin neutral. Es importante levantarse utilizando la fuerza de las piernas, no la fuerza de la espalda. No trabe las rodillas hacia afuera. Siempre pida ayuda a otra persona para levantar objetos pesados o incmodos. Esta informacin no tiene Theme park manager el consejo del mdico. Asegrese de hacerle al mdico cualquier pregunta que tenga. Document Revised: 09/02/2022 Document Reviewed: 09/02/2022 Elsevier Patient Education  2024 Elsevier Inc.  Rehabilitacin de esguince o distensin de la parte inferior de la espalda Low Back Sprain or Strain Rehab Pregunte al mdico  qu ejercicios son seguros para usted. Haga los ejercicios exactamente como se lo haya indicado el mdico y gradelos como se lo hayan indicado. Es normal sentir un leve estiramiento, tironeo, opresin o Dentist al Manpower Inc ejercicios. Detngase de inmediato si siente un dolor repentino o Community education officer. No comience a hacer estos ejercicios hasta que se lo indique el mdico. Ejercicios de elongacin y amplitud de movimiento Estos ejercicios calientan los msculos y las articulaciones, y mejoran el movimiento y la flexibilidad de la espalda. Estos ejercicios tambin ayudan a aliviar el dolor, el adormecimiento y el hormigueo. Rotacin lumbar  Acustese boca arriba sobre una cama firme o sobre el suelo con las rodillas dobladas. Coloque los brazos extendidos a los lados de modo que cada brazo forme  un ngulo de 90 grados (ngulo recto) con respecto al cuerpo. Mueva lentamente (rote) ambas rodillas hacia un lado del cuerpo hasta que sienta un estiramiento en la parte inferior de la espalda (zona lumbar). Trate de no levantar los hombros del piso. Mantenga esta posicin durante __________ segundos. Tensione los msculos abdominales y lentamente lleve las rodillas a la posicin inicial. Repita este ejercicio del otro lado del cuerpo. Repita __________ veces. Realice este ejercicio __________ veces al da. Rodilla al pecho  Acustese boca arriba sobre una cama firme o sobre el suelo con las piernas extendidas. Flexione una rodilla. Tome la rodilla con las manos y llvela hacia el pecho hasta que sienta un estiramiento suave en la parte inferior de la espalda y las nalgas. Mantenga la pierna en esta posicin tomando la parte frontal de la rodilla. Mantenga la otra pierna lo ms extendida posible. Mantenga esta posicin durante __________ segundos. Vuelva lentamente a la posicin inicial. Repita el ejercicio con la otra pierna. Repita __________ veces. Realice este ejercicio __________ veces al  da. Extensin [employer] codos, en decbito prono  Recustese boca abajo en una cama firme o en el piso (posicin prona). [employer] Kiewit Sons codos. Con los brazos, aydese a Haematologist sentir un leve estiramiento en el abdomen y la parte inferior de la espalda. Esto Paramedic de [employer] codos. Si no se siente cmodo, intente colocando almohadas debajo del pecho. Debe dejar la cadera inmvil sobre la superficie en la que est apoyado. Mantenga la cadera y los msculos de la espalda relajados. Mantenga esta posicin durante __________ segundos. Afloje lentamente la parte superior del cuerpo y vuelva a la posicin inicial. Repita __________ veces. Realice este ejercicio __________ veces al da. Ejercicios de fortalecimiento Estos ejercicios fortalecen la espalda y le otorgan resistencia. La resistencia es la capacidad de usar los msculos durante un tiempo prolongado, incluso despus de que se cansen. Inclinacin de la pelvis Este ejercicio fortalece los msculos que se encuentran en la parte profunda del abdomen. Acustese boca arriba sobre una cama dura o sobre el suelo con las piernas extendidas. Flexione las rodillas de modo que apunten al techo y los pies queden apoyados en el suelo. Contraiga los msculos de la parte baja del abdomen para empujar la zona lumbar contra el suelo. Con este movimiento se inclinar la pelvis de modo que el coxis apunte hacia el techo, en lugar de apuntar a los pies o al suelo. Para realizar este ejercicio, puede colocar una toalla pequea debajo de la parte inferior de la espalda y presionar la espalda contra la toalla. Mantenga esta posicin durante __________ segundos. Relaje totalmente los msculos antes de repetir el ejercicio. Repita __________ veces. Realice este ejercicio __________ veces al da. Elevaciones alternadas de pierna y brazo  Apoye las palmas de las manos y las rodillas sobre una superficie firme. Si est sobre  un suelo duro, puede usar un elemento acolchado, como una alfombrilla para ejercicios, para apoyar las rodillas. Alinee los brazos y las piernas. Las manos deben estar justo debajo de los hombros, y las rodillas debajo de la cadera. Eleve la pierna [employer]. Al mismo tiempo, eleve el brazo derecho y Associate Professor frente a usted. No eleve la pierna por encima de la cadera. No eleve el brazo por encima del hombro. Mantenga los msculos del abdomen y de la espalda contrados. Mantenga la cadera mirando hacia el suelo. No arquee la espalda. Mantenga el equilibrio con cuidado y no Games developer  la respiracin. Mantenga esta posicin durante __________ segundos. Vuelva lentamente a la posicin inicial. Repita con su pierna derecha y su brazo izquierdo. Repita __________ veces. Realice este ejercicio __________ veces al da. Serie de abdominales con levantamiento de pierna extendida  Acustese boca arriba sobre una cama firme o sobre el suelo. Flexione una rodilla y mantenga la otra pierna extendida. Tensione los msculos abdominales y levante la pierna extendida, a unas 4 o 6 pulgadas (10 o 15 cm) del suelo. Mantenga apretados los msculos abdominales y sostenga esta posicin durante __________ segundos. No contenga la respiracin. No arquee la espalda. Mantngala plana contra el suelo. Mantenga tensos los msculos abdominales mientras baja lentamente la pierna hasta la posicin inicial. Repita el ejercicio con la otra pierna. Repita __________ veces. Realice este ejercicio __________ veces al da. Bajar una pierna con rodillas flexionadas Acustese boca arriba sobre una cama firme o sobre el suelo. Apriete los msculos abdominales y [employer] del piso, uno a la vez, de modo que las rodillas y la cadera estn flexionadas en ngulos de 90 grados (ngulos rectos). Las rodillas deben estar por encima de la cadera y las pantorrillas deben quedar paralelas al piso. Con los msculos  abdominales tensos y la rodilla flexionada, baje lentamente una pierna de modo que los dedos del pie toquen el suelo. Levante la pierna para volver a la posicin inicial. No contenga la respiracin. No deje que la espalda se arquee. Mantenga la espalda plana contra el suelo. Repita el ejercicio con la otra pierna. Repita __________ veces. Realice este ejercicio __________ veces al da. Postura y mecnica corporal La buena postura y la mecnica corporal saludable pueden ayudar a Acupuncturist estrs en las articulaciones y los tejidos del cuerpo. La Water quality scientist se refiere a los movimientos y a las posiciones del cuerpo mientras realiza las actividades diarias. La postura es una parte de la Water quality scientist. Ignacia buena postura significa que: La columna est en su posicin natural de curvatura en S (neutral). Los hombros estn [employer]. La cabeza no est inclinada hacia adelante (neutral). Siga esas pautas para mejorar la postura y Regulatory affairs officer en sus actividades diarias. De pie  Al estar de pie, mantenga la columna en la posicin neutral y los pies separados al ancho de caderas, aproximadamente. Mantenga las rodillas ligeramente flexionadas. Las [place], los hombros y las caderas deben estar alineados. Cuando realice una tarea en la que deba estar de pie en el mismo sitio durante mucho tiempo, coloque un pie en un objeto estable de 2 a 4 pulgadas (5 a 10 cm) de alto, como un taburete. Esto ayuda a que la columna mantenga una posicin neutral. Sentado  Cuando est sentado, mantenga la columna en posicin neutral y deje los pies apoyados en el suelo. Use un apoyapis, si es necesario, y [employer] muslos paralelos al suelo. Evite redondear los hombros e inclinar la cabeza hacia adelante. Cuando trabaje en un escritorio o con una computadora, el escritorio debe estar a una altura en la que las manos estn un poco ms abajo que los codos. Deslice la silla debajo del  escritorio, de modo de estar lo suficientemente cerca como para mantener una buena [place]. Cuando trabaje con una computadora, coloque el monitor a una altura que le permita mirar derecho hacia adelante, sin tener que inclinar la cabeza hacia adelante o [place] atrs. Reposo Al descansar o estar acostado, evite las posiciones que le causen ms dolor. Si siente dolor al [employer]  exigen sentarse, inclinarse, agacharse o ponerse en cuclillas, acustese en una posicin en la que el cuerpo no deba doblarse mucho. Por ejemplo, evite acurrucarse de costado con los brazos y las rodillas cerca del pecho (posicin fetal). Si siente dolor con las actividades que exigen estar de pie durante mucho tiempo o Furniture conservator/restorer los brazos, acustese con la columna en una posicin neutral y flexione ligeramente las rodillas. Pruebe con las siguientes posiciones: Acostarse de costado con una almohada entre las rodillas. Acostarse boca arriba con una almohada debajo de las rodillas. Levantar objetos  Cuando tenga que levantar un objeto, mantenga los pies separados el ancho de los hombros y apriete los msculos abdominales. Flexione las rodillas y la cadera, y Dietitian la columna en posicin neutral. Es importante levantarse utilizando la fuerza de las piernas, no de la espalda. No trabe las rodillas hacia afuera. Siempre pida ayuda a otra persona para levantar objetos pesados o incmodos. Esta informacin no tiene Theme park manager el consejo del mdico. Asegrese de hacerle al mdico cualquier pregunta que tenga. Document Revised: 04/01/2021 Document Reviewed: 04/01/2021 Elsevier Patient Education  2024 [employer].

## 2024-08-17 ENCOUNTER — Other Ambulatory Visit: Payer: Self-pay

## 2024-08-17 MED ORDER — ETHYL CHLORIDE EX AERO
INHALATION_SPRAY | CUTANEOUS | 11 refills | Status: AC
  Filled 2024-08-17: qty 116, 30d supply, fill #0
  Filled 2024-10-23: qty 116, 30d supply, fill #1
  Filled 2024-12-26: qty 116, 30d supply, fill #2
  Filled 2025-01-09: qty 116, 30d supply, fill #3

## 2024-08-20 ENCOUNTER — Other Ambulatory Visit: Payer: Self-pay | Admitting: Internal Medicine

## 2024-08-20 DIAGNOSIS — F5104 Psychophysiologic insomnia: Secondary | ICD-10-CM

## 2024-08-21 ENCOUNTER — Telehealth: Payer: Self-pay

## 2024-08-21 ENCOUNTER — Other Ambulatory Visit: Payer: Self-pay

## 2024-08-21 DIAGNOSIS — F5104 Psychophysiologic insomnia: Secondary | ICD-10-CM

## 2024-08-21 MED ORDER — ZOLPIDEM TARTRATE 10 MG PO TABS
10.0000 mg | ORAL_TABLET | Freq: Every evening | ORAL | 3 refills | Status: AC | PRN
  Filled 2024-08-21: qty 30, 30d supply, fill #0
  Filled 2024-10-09: qty 30, 30d supply, fill #1
  Filled 2024-11-27: qty 30, 30d supply, fill #2
  Filled 2025-01-12: qty 30, 30d supply, fill #3

## 2024-08-21 NOTE — Telephone Encounter (Signed)
 Patient is requesting refills on zolpidem  (AMBIEN ) 10 MG tablet and is requesting for it to be sent to our community pharmacy.

## 2024-08-21 NOTE — Addendum Note (Signed)
 Addended by: VICCI SOBER B on: 08/21/2024 06:33 PM   Modules accepted: Orders

## 2024-08-22 ENCOUNTER — Other Ambulatory Visit: Payer: Self-pay

## 2024-09-13 ENCOUNTER — Telehealth: Payer: Self-pay

## 2024-09-13 ENCOUNTER — Other Ambulatory Visit: Payer: Self-pay

## 2024-09-13 ENCOUNTER — Other Ambulatory Visit: Payer: Self-pay | Admitting: Internal Medicine

## 2024-09-13 DIAGNOSIS — I129 Hypertensive chronic kidney disease with stage 1 through stage 4 chronic kidney disease, or unspecified chronic kidney disease: Secondary | ICD-10-CM

## 2024-09-13 NOTE — Telephone Encounter (Signed)
 Patient came in requesting refills on amLODipine  (NORVASC ) 5 MG  carvedilol  (COREG ) 12.5 MG

## 2024-09-14 ENCOUNTER — Other Ambulatory Visit: Payer: Self-pay

## 2024-09-14 MED ORDER — AMLODIPINE BESYLATE 5 MG PO TABS
5.0000 mg | ORAL_TABLET | Freq: Two times a day (BID) | ORAL | 1 refills | Status: AC
  Filled 2024-09-14: qty 180, 90d supply, fill #0
  Filled 2025-01-12: qty 180, 90d supply, fill #1

## 2024-09-14 NOTE — Telephone Encounter (Signed)
 Rxn sent to our pharmacy downstairs.

## 2024-09-15 NOTE — Telephone Encounter (Signed)
 Called and spoke to the patient. Verified name & DOB. Informed that requested refill was sent to our pharmacy. Patient expressed verbal understanding.

## 2024-09-18 ENCOUNTER — Other Ambulatory Visit: Payer: Self-pay

## 2024-09-19 ENCOUNTER — Ambulatory Visit: Payer: Self-pay | Admitting: Rheumatology

## 2024-10-09 ENCOUNTER — Other Ambulatory Visit: Payer: Self-pay

## 2024-10-09 ENCOUNTER — Other Ambulatory Visit: Payer: Self-pay | Admitting: Nurse Practitioner

## 2024-10-09 DIAGNOSIS — K219 Gastro-esophageal reflux disease without esophagitis: Secondary | ICD-10-CM

## 2024-10-09 MED ORDER — OMEPRAZOLE 20 MG PO CPDR
20.0000 mg | DELAYED_RELEASE_CAPSULE | Freq: Two times a day (BID) | ORAL | 6 refills | Status: AC
  Filled 2024-10-09 (×2): qty 60, 30d supply, fill #0
  Filled 2025-01-12: qty 60, 30d supply, fill #1

## 2024-10-16 ENCOUNTER — Other Ambulatory Visit: Payer: Self-pay

## 2024-10-23 ENCOUNTER — Other Ambulatory Visit: Payer: Self-pay

## 2024-10-30 ENCOUNTER — Other Ambulatory Visit: Payer: Self-pay | Admitting: Internal Medicine

## 2024-10-30 ENCOUNTER — Other Ambulatory Visit: Payer: Self-pay

## 2024-10-30 DIAGNOSIS — K6289 Other specified diseases of anus and rectum: Secondary | ICD-10-CM

## 2024-10-30 MED ORDER — NYSTATIN-TRIAMCINOLONE 100000-0.1 UNIT/GM-% EX OINT
1.0000 | TOPICAL_OINTMENT | Freq: Two times a day (BID) | CUTANEOUS | 0 refills | Status: AC
  Filled 2024-10-30: qty 30, 15d supply, fill #0

## 2024-10-30 MED ORDER — LIDOCAINE 5 % EX OINT
1.0000 | TOPICAL_OINTMENT | CUTANEOUS | 0 refills | Status: AC | PRN
  Filled 2024-10-30: qty 35.44, 30d supply, fill #0

## 2024-11-07 ENCOUNTER — Ambulatory Visit (INDEPENDENT_AMBULATORY_CARE_PROVIDER_SITE_OTHER): Payer: Self-pay | Admitting: Primary Care

## 2024-11-08 ENCOUNTER — Ambulatory Visit (INDEPENDENT_AMBULATORY_CARE_PROVIDER_SITE_OTHER): Payer: Self-pay | Admitting: Family Medicine

## 2024-11-08 ENCOUNTER — Other Ambulatory Visit: Payer: Self-pay

## 2024-11-08 ENCOUNTER — Encounter: Payer: Self-pay | Admitting: Family Medicine

## 2024-11-08 ENCOUNTER — Encounter: Payer: Self-pay | Admitting: Gastroenterology

## 2024-11-08 VITALS — BP 134/78 | HR 87 | Ht <= 58 in | Wt 102.8 lb

## 2024-11-08 DIAGNOSIS — Z789 Other specified health status: Secondary | ICD-10-CM

## 2024-11-08 DIAGNOSIS — K649 Unspecified hemorrhoids: Secondary | ICD-10-CM

## 2024-11-08 DIAGNOSIS — K5900 Constipation, unspecified: Secondary | ICD-10-CM

## 2024-11-08 MED ORDER — HYDROCORTISONE (PERIANAL) 2.5 % EX CREA
1.0000 | TOPICAL_CREAM | Freq: Two times a day (BID) | CUTANEOUS | 1 refills | Status: AC
  Filled 2024-11-08: qty 30, 10d supply, fill #0
  Filled 2025-01-12: qty 30, 10d supply, fill #1

## 2024-11-08 NOTE — Progress Notes (Signed)
 Established Patient Office Visit  Subjective    Patient ID: Sabrina Mitchell, female    DOB: [birth year]  Age: 63 y.o. MRN: [id number]  CC:  Chief Complaint  Patient presents with   Hemorrhoids    HPI Sabrina Mitchell presents for complaint of hemorrhoids.  Patient also reports intermittent constipation. This visit was aided by an interpreter.   Outpatient Encounter Medications as of 11/08/2024  Medication Sig   acetaminophen  (TYLENOL ) 500 MG tablet Take 500-1,000 mg by mouth every 6 (six) hours as needed for moderate pain or headache.   amLODipine  (NORVASC ) 5 MG tablet Take 1 tablet (5 mg total) by mouth 2 (two) times daily for blood pressure.   carvedilol  (COREG ) 12.5 MG tablet Take 1 tablet (12.5 mg total) by mouth 2 (two) times daily for blood pressure.   cinacalcet  (SENSIPAR ) 60 MG tablet Take 60 mg by mouth daily.   hydrocortisone  (ANUSOL -HC) 2.5 % rectal cream Place 1 Application rectally 2 (two) times daily.   hydrocortisone  (ANUSOL -HC) 2.5 % rectal cream Place 1 Application rectally 2 (two) times daily.   omeprazole  (PRILOSEC) 20 MG capsule Take 1 capsule (20 mg total) by mouth 2 (two) times daily before a meal.(30 mins prior to breakfast and supper)   B Complex-C-Folic Acid (DIALYVITE 800) 0.8 MG TABS Take 1 tablet by mouth daily at 2 PM. (Patient not taking: Reported on 08/16/2024)   calcium carbonate (TUMS - DOSED IN MG ELEMENTAL CALCIUM) 500 MG chewable tablet Chew 1-2 tablets by mouth 3 (three) times daily as needed for indigestion or heartburn. (Patient not taking: Reported on 08/16/2024)   carvedilol  (COREG ) 12.5 MG tablet Take 1 tablet (12.5 mg total) by mouth 2 (two) times daily for high blood pressure. (Patient not taking: Reported on 08/16/2024)   cinacalcet  (SENSIPAR ) 90 MG tablet Take 1 tablet (90 mg total) by mouth daily. TAKE WITH LARGEST MEAL. (Patient not taking: Reported on 11/08/2024)   diclofenac  Sodium (VOLTAREN ) 1 % GEL Apply 1 a small amount to  affected area twice a day as needed for pain (Patient not taking: Reported on 08/16/2024)   dicyclomine  (BENTYL ) 10 MG capsule Take 1 capsule (10 mg total) by mouth 4 (four) times daily -  before meals and at bedtime. For stomach pain (Patient not taking: Reported on 08/16/2024)   ethyl chloride spray Spray 1 as directed to skin three times a week as directed apply a small amount of spray to dialysis access just before needle stick   ethyl chloride spray Spray 1 as directed to skin three times a week as directed apply a small amount of spray to dialysis access just before needle stick   ferric citrate (AURYXIA) 1 GM 210 MG(Fe) tablet Take 420-630 mg by mouth See admin instructions. Take 2-3 tablets (420 - 630 mg) by mouth with each meal or snack   hydrocortisone  (ANUSOL -HC) 25 MG suppository Place 1 suppository (25 mg total) rectally 2 (two) times daily as needed for hemorrhoids or anal itching. (Patient not taking: Reported on 08/16/2024)   lidocaine  (XYLOCAINE ) 5 % ointment Apply 1 Application topically as needed.   nystatin -triamcinolone  ointment (MYCOLOG) Apply 1 application topically 2 (two) times daily.   Omega-3 Fatty Acids (OMEGA 3 500) 500 MG CAPS Take 500 mg by mouth every other day. (Patient not taking: Reported on 08/16/2024)   zolpidem  (AMBIEN ) 10 MG tablet Take 1 tablet (10 mg total) by mouth at bedtime as needed for sleep.   Facility-Administered Encounter Medications as of 11/08/2024  Medication   0.9 %  sodium chloride  infusion   sodium chloride  flush (NS) 0.9 % injection 3 mL    Past Medical History:  Diagnosis Date   Anemia    Anxiety    Complication of anesthesia    Depression    denies   End stage renal disease (HCC)    hemodialysis 3x/wk @ [address]   Family history of adverse reaction to anesthesia    sister vomiting after uterine cyst removal   Gastritis    GERD (gastroesophageal reflux disease)    Hemorrhoids    Hypertension    Pneumonia    years ago    PONV (postoperative nausea and vomiting)    UTI (lower urinary tract infection) March 2014    Past Surgical History:  Procedure Laterality Date   A/V FISTULAGRAM Right 06/12/2022   Procedure: A/V Fistulagram;  Surgeon: Eliza Lonni RAMAN, MD;  Location: [facility] INVASIVE CV LAB;  Service: Cardiovascular;  Laterality: Right;   AV FISTULA PLACEMENT Left 07/05/09   Dr. Gerlean   AV FISTULA PLACEMENT Right 09/26/2021   Procedure: RIGHT ARM ARTERIOVENOUS (AV) FISTULA CREATION;  Surgeon: Sheree Penne Lonni, MD;  Location: [facility] OR;  Service: Vascular;  Laterality: Right;   COLONOSCOPY N/A 02/10/2016   SLF: 1. HEME postive stool due to colon polyps intrernal hemorrhoids 2. small internal hemrrohoids 3. moderate external hemorrhoids.    ESOPHAGEAL DILATION N/A 02/10/2016   Procedure: ESOPHAGEAL DILATION;  Surgeon: Margo LITTIE Haddock, MD;  Location: [facility];  Service: Endoscopy;  Laterality: N/A;   ESOPHAGOGASTRODUODENOSCOPY N/A 02/10/2016   SLF: patent Schatzki's ring , mild gastritis/ duodentitis.    FISTULA SUPERFICIALIZATION Left 11/04/2018   Procedure: PLICATION BRACHIOCEPHALIC FISTULA;  Surgeon: Eliza Lonni RAMAN, MD;  Location: [facility] OR;  Service: Vascular;  Laterality: Left;   FISTULOGRAM N/A 10/22/2014   Procedure: FISTULOGRAM;  Surgeon: Lonni RAMAN Eliza, MD;  Location: [facility] CATH LAB;  Service: Cardiovascular;  Laterality: N/A;   INSERTION OF DIALYSIS CATHETER Right 08/27/2021   Procedure: INSERTION OF 19cm TUNNELED DIALYSIS CATHETER INTO RIGHT NECK;  Surgeon: Gretta Lonni PARAS, MD;  Location: [facility] OR;  Service: Vascular;  Laterality: Right;   IR DIALY SHUNT INTRO NEEDLE/INTRACATH INITIAL W/IMG LEFT Left 02/01/2019   IR DIALY SHUNT INTRO NEEDLE/INTRACATH INITIAL W/IMG LEFT Left 06/05/2020   IR DIALY SHUNT INTRO NEEDLE/INTRACATH INITIAL W/IMG LEFT Left 05/09/2021   IR REMOVAL TUN CV CATH W/O FL  12/04/2022   IR THROMBECTOMY AV FISTULA W/THROMBOLYSIS/PTA INC/SHUNT/IMG LEFT Left 08/18/2021   IR  US  GUIDE VASC ACCESS LEFT  08/18/2021   PERIPHERAL VASCULAR BALLOON ANGIOPLASTY Left 07/14/2021   Procedure: PERIPHERAL VASCULAR BALLOON ANGIOPLASTY;  Surgeon: Sheree Penne Lonni, MD;  Location: [facility] INVASIVE CV LAB;  Service: Cardiovascular;  Laterality: Left;   PERIPHERAL VASCULAR CATHETERIZATION Left 12/09/2016   Procedure: A/V Fistulagram;  Surgeon: Penne Lonni Sheree, MD;  Location: [facility] INVASIVE CV LAB;  Service: Cardiovascular;  Laterality: Left;   REVISON OF ARTERIOVENOUS FISTULA Left 10/28/2015   Procedure: PLICATION OF BRACHIOCEPHALIC FISTULA;  Surgeon: Redell LITTIE Door, MD;  Location: [facility] OR;  Service: Vascular;  Laterality: Left;   REVISON OF ARTERIOVENOUS FISTULA Left 08/09/2017   Procedure: REVISION OF ARTERIOVENOUS FISTULA LIGATION OF SIDE BRANCH;  Surgeon: Sheree Penne Lonni, MD;  Location: [facility] OR;  Service: Vascular;  Laterality: Left;   TUBAL LIGATION  2004    Family History  Problem Relation Age of Onset   Hypertension Mother    Hypertension Father    Hypertension  Sister    Breast cancer Sister 41   Diabetes Sister    Hypertension Sister    Hypertension Sister    Diabetes Brother    Hypertension Brother    Cirrhosis Brother    Other Brother        COVID-19   Asthma Son    Thyroid disease Daughter     Social History   Socioeconomic History   Marital status: Single    Spouse name: Not on file   Number of children: 2   Years of education: Not on file   Highest education level: Not on file  Occupational History   Not on file  Tobacco Use   Smoking status: Never    Passive exposure: Current   Smokeless tobacco: Never  Vaping Use   Vaping status: Never Used  Substance and Sexual Activity   Alcohol use: No   Drug use: No   Sexual activity: Not Currently    Birth control/protection: Surgical  Other Topics Concern   Not on file  Social History Narrative   Not on file   Social Drivers of Health   Financial Resource Strain: High Risk  (01/14/2024)   Overall Financial Resource Strain (CARDIA)    Difficulty of Paying Living Expenses: Very hard  Food Insecurity: Food Insecurity Present (01/14/2024)   Hunger Vital Sign    Worried About Running Out of Food in the Last Year: Sometimes true    Ran Out of Food in the Last Year: Sometimes true  Transportation Needs: No Transportation Needs (01/14/2024)   PRAPARE - Administrator, Civil Service (Medical): No    Lack of Transportation (Non-Medical): No  Physical Activity: Insufficiently Active (01/14/2024)   Exercise Vital Sign    Days of Exercise per Week: 3 days    Minutes of Exercise per Session: 30 min  Stress: Stress Concern Present (01/14/2024)   [employer] of Occupational Health - Occupational Stress Questionnaire    Feeling of Stress : To some extent  Social Connections: Socially Integrated (01/14/2024)   Social Connection and Isolation Panel    Frequency of Communication with Friends and Family: More than three times a week    Frequency of Social Gatherings with Friends and Family: More than three times a week    Attends Religious Services: More than 4 times per year    Active Member of [employer] or Organizations: Yes    Attends Banker Meetings: 1 to 4 times per year    Marital Status: Living with partner  Intimate Partner Violence: Not At Risk (01/14/2024)   Humiliation, Afraid, Rape, and Kick questionnaire    Fear of Current or Ex-Partner: No    Emotionally Abused: No    Physically Abused: No    Sexually Abused: No    Review of Systems  Gastrointestinal:  Positive for constipation.  All other systems reviewed and are negative.       Objective    BP 134/78 (BP Location: Left Leg, Cuff Size: Normal)   Pulse 87   Ht 4' 9.5 (1.461 m)   Wt 102 lb 12.8 oz (46.6 kg)   LMP 02/11/2012 Comment: tubal ligation  SpO2 95%   BMI 21.86 kg/m   Physical Exam Vitals and nursing note reviewed.  Constitutional:      General: She is not in  acute distress. Cardiovascular:     Rate and Rhythm: Normal rate and regular rhythm.  Pulmonary:     Effort: Pulmonary effort is normal.  Breath sounds: Normal breath sounds.  Abdominal:     Palpations: Abdomen is soft.     Tenderness: There is no abdominal tenderness.  Neurological:     General: No focal deficit present.     Mental Status: She is alert and oriented to person, place, and time.         Assessment & Plan:   Hemorrhoids, unspecified hemorrhoid type -     Ambulatory referral to Gastroenterology  Constipation, unspecified constipation type  Language barrier to communication  Other orders -     Hydrocortisone  (Perianal); Place 1 Application rectally 2 (two) times daily.  Dispense: 30 g; Refill: 1   Dicussed importance of not being constipated and supporting diet and activity options.   No follow-ups on file.   Tanda Raguel SQUIBB, MD

## 2024-11-13 ENCOUNTER — Other Ambulatory Visit: Payer: Self-pay

## 2024-11-13 ENCOUNTER — Telehealth: Payer: Self-pay

## 2024-11-13 NOTE — Telephone Encounter (Addendum)
 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 States she got a cream prescribed but it doesn't help she says she feels like its inside not on the outside. Please advice if patient would need an appointment with PCP for this again.

## 2024-11-15 NOTE — Telephone Encounter (Signed)
 Called & spoke to the patient. Verified name & DOB. Clarified with patient that there was not a referral that was sent to [facility] but rather to [facility] [employer] Informed her that an appointment was scheduled at their office for 12/29/24 at 11:30 a.m. Patient expressed verbal understanding and clarified that she would like to wait until the appointment but will call to schedule an appointment with [facility] if she has a flare-up. Encouraged patient to go to [facility] if there are no available appointments with [facility] or if it occurs over the weekend. Patient expressed verbal understanding of all discussed.

## 2024-11-15 NOTE — Telephone Encounter (Signed)
 See Nora's reply. Thanks.

## 2024-11-27 ENCOUNTER — Other Ambulatory Visit: Payer: Self-pay

## 2024-12-26 ENCOUNTER — Other Ambulatory Visit: Payer: Self-pay

## 2024-12-29 ENCOUNTER — Ambulatory Visit: Payer: Self-pay | Admitting: Gastroenterology

## 2024-12-29 NOTE — Progress Notes (Deleted)
 "  Chief Complaint:hemorrhoids, constipation  Primary GI Doctor:***  HPI:  Patient is a  64  year old female patient with past medical history of GERD, hypertension, anxiety*****who was referred to me by Vicci Barnie NOVAK, MD on 11/08/24 for a evaluation of hemorrhoids, constipation .    *Due to language barrier, an interpreter was present during the history-taking and subsequent discussion (and for part of the physical exam) with this patient.    Interval History  Patient admits/denies GERD Omeprazole  20mg   Patient admits/denies dysphagia Patient admits/denies nausea, vomiting, or weight loss  Prescribed hydrocortisone   Patient admits/denies altered bowel habits Patient admits/denies abdominal pain Patient admits/denies rectal bleeding   Denies/Admits alcohol Denies/Admits smoking Denies/Admits NSAID use. Denies/Admits they are on blood thinners.  Patients last colonoscopy Patients last EGD  Surgical history:  Patient's family history includes  Wt Readings from Last 3 Encounters:  11/08/24 102 lb 12.8 oz (46.6 kg)  08/16/24 106 lb 12.8 oz (48.4 kg)  07/17/24 108 lb (49 kg)      Past Medical History:  Diagnosis Date   Anemia    Anxiety    Complication of anesthesia    Depression    denies   End stage renal disease (HCC)    hemodialysis 3x/wk @ [address]   Family history of adverse reaction to anesthesia    sister vomiting after uterine cyst removal   Gastritis    GERD (gastroesophageal reflux disease)    Hemorrhoids    Hypertension    Pneumonia    years ago   PONV (postoperative nausea and vomiting)    UTI (lower urinary tract infection) March 2014    Past Surgical History:  Procedure Laterality Date   A/V FISTULAGRAM Right 06/12/2022   Procedure: A/V Fistulagram;  Surgeon: Eliza Lonni RAMAN, MD;  Location: [facility] INVASIVE CV LAB;  Service: Cardiovascular;  Laterality: Right;   AV FISTULA PLACEMENT Left 07/05/09   Dr. Gerlean   AV FISTULA  PLACEMENT Right 09/26/2021   Procedure: RIGHT ARM ARTERIOVENOUS (AV) FISTULA CREATION;  Surgeon: Sheree Penne Lonni, MD;  Location: [facility] OR;  Service: Vascular;  Laterality: Right;   COLONOSCOPY N/A 02/10/2016   SLF: 1. HEME postive stool due to colon polyps intrernal hemorrhoids 2. small internal hemrrohoids 3. moderate external hemorrhoids.    ESOPHAGEAL DILATION N/A 02/10/2016   Procedure: ESOPHAGEAL DILATION;  Surgeon: Margo LITTIE Haddock, MD;  Location: [facility] ENDO SUITE;  Service: Endoscopy;  Laterality: N/A;   ESOPHAGOGASTRODUODENOSCOPY N/A 02/10/2016   SLF: patent Schatzki's ring , mild gastritis/ duodentitis.    FISTULA SUPERFICIALIZATION Left 11/04/2018   Procedure: PLICATION BRACHIOCEPHALIC FISTULA;  Surgeon: Eliza Lonni RAMAN, MD;  Location: [facility] OR;  Service: Vascular;  Laterality: Left;   FISTULOGRAM N/A 10/22/2014   Procedure: FISTULOGRAM;  Surgeon: Lonni RAMAN Eliza, MD;  Location: [facility] CATH LAB;  Service: Cardiovascular;  Laterality: N/A;   INSERTION OF DIALYSIS CATHETER Right 08/27/2021   Procedure: INSERTION OF 19cm TUNNELED DIALYSIS CATHETER INTO RIGHT NECK;  Surgeon: Gretta Lonni PARAS, MD;  Location: [facility] OR;  Service: Vascular;  Laterality: Right;   IR DIALY SHUNT INTRO NEEDLE/INTRACATH INITIAL W/IMG LEFT Left 02/01/2019   IR DIALY SHUNT INTRO NEEDLE/INTRACATH INITIAL W/IMG LEFT Left 06/05/2020   IR DIALY SHUNT INTRO NEEDLE/INTRACATH INITIAL W/IMG LEFT Left 05/09/2021   IR REMOVAL TUN CV CATH W/O FL  12/04/2022   IR THROMBECTOMY AV FISTULA W/THROMBOLYSIS/PTA INC/SHUNT/IMG LEFT Left 08/18/2021   IR US  GUIDE VASC ACCESS LEFT  08/18/2021   PERIPHERAL VASCULAR BALLOON  ANGIOPLASTY Left 07/14/2021   Procedure: PERIPHERAL VASCULAR BALLOON ANGIOPLASTY;  Surgeon: Sheree Penne Bruckner, MD;  Location: [facility] INVASIVE CV LAB;  Service: Cardiovascular;  Laterality: Left;   PERIPHERAL VASCULAR CATHETERIZATION Left 12/09/2016   Procedure: A/V Fistulagram;  Surgeon: Penne Bruckner Sheree, MD;   Location: [facility] INVASIVE CV LAB;  Service: Cardiovascular;  Laterality: Left;   REVISON OF ARTERIOVENOUS FISTULA Left 10/28/2015   Procedure: PLICATION OF BRACHIOCEPHALIC FISTULA;  Surgeon: Redell LITTIE Door, MD;  Location: [facility] OR;  Service: Vascular;  Laterality: Left;   REVISON OF ARTERIOVENOUS FISTULA Left 08/09/2017   Procedure: REVISION OF ARTERIOVENOUS FISTULA LIGATION OF SIDE BRANCH;  Surgeon: Sheree Penne Bruckner, MD;  Location: [facility] OR;  Service: Vascular;  Laterality: Left;   TUBAL LIGATION  2004    Current Outpatient Medications  Medication Sig Dispense Refill   acetaminophen  (TYLENOL ) 500 MG tablet Take 500-1,000 mg by mouth every 6 (six) hours as needed for moderate pain or headache.     amLODipine  (NORVASC ) 5 MG tablet Take 1 tablet (5 mg total) by mouth 2 (two) times daily for blood pressure. 180 tablet 1   B Complex-C-Folic Acid (DIALYVITE 800) 0.8 MG TABS Take 1 tablet by mouth daily at 2 PM. (Patient not taking: Reported on 08/16/2024)     calcium carbonate (TUMS - DOSED IN MG ELEMENTAL CALCIUM) 500 MG chewable tablet Chew 1-2 tablets by mouth 3 (three) times daily as needed for indigestion or heartburn. (Patient not taking: Reported on 08/16/2024)     carvedilol  (COREG ) 12.5 MG tablet Take 1 tablet (12.5 mg total) by mouth 2 (two) times daily for blood pressure. 180 tablet 3   carvedilol  (COREG ) 12.5 MG tablet Take 1 tablet (12.5 mg total) by mouth 2 (two) times daily for high blood pressure. (Patient not taking: Reported on 08/16/2024) 180 tablet 3   cinacalcet  (SENSIPAR ) 60 MG tablet Take 60 mg by mouth daily.     cinacalcet  (SENSIPAR ) 90 MG tablet Take 1 tablet (90 mg total) by mouth daily. TAKE WITH LARGEST MEAL. (Patient not taking: Reported on 11/08/2024) 30 tablet 11   diclofenac  Sodium (VOLTAREN ) 1 % GEL Apply 1 a small amount to affected area twice a day as needed for pain (Patient not taking: Reported on 08/16/2024) 100 g 3   dicyclomine  (BENTYL ) 10 MG capsule Take 1 capsule (10  mg total) by mouth 4 (four) times daily -  before meals and at bedtime. For stomach pain (Patient not taking: Reported on 08/16/2024) 60 capsule 1   ethyl chloride spray Spray 1 as directed to skin three times a week as directed apply a small amount of spray to dialysis access just before needle stick 116 mL 11   ethyl chloride spray Spray 1 as directed to skin three times a week as directed apply a small amount of spray to dialysis access just before needle stick 116 mL 11   ferric citrate (AURYXIA) 1 GM 210 MG(Fe) tablet Take 420-630 mg by mouth See admin instructions. Take 2-3 tablets (420 - 630 mg) by mouth with each meal or snack     hydrocortisone  (ANUSOL -HC) 2.5 % rectal cream Place 1 Application rectally 2 (two) times daily. 30 g 0   hydrocortisone  (ANUSOL -HC) 2.5 % rectal cream Place 1 Application rectally 2 (two) times daily. 30 g 1   hydrocortisone  (ANUSOL -HC) 25 MG suppository Place 1 suppository (25 mg total) rectally 2 (two) times daily as needed for hemorrhoids or anal itching. (Patient not taking: Reported on 08/16/2024) 12 suppository  0   lidocaine  (XYLOCAINE ) 5 % ointment Apply 1 Application topically as needed. 35.44 g 0   nystatin -triamcinolone  ointment (MYCOLOG) Apply 1 application topically 2 (two) times daily. 30 g 0   Omega-3 Fatty Acids (OMEGA 3 500) 500 MG CAPS Take 500 mg by mouth every other day. (Patient not taking: Reported on 08/16/2024)     omeprazole  (PRILOSEC) 20 MG capsule Take 1 capsule (20 mg total) by mouth 2 (two) times daily before a meal.(30 mins prior to breakfast and supper) 60 capsule 6   zolpidem  (AMBIEN ) 10 MG tablet Take 1 tablet (10 mg total) by mouth at bedtime as needed for sleep. 30 tablet 3   Current Facility-Administered Medications  Medication Dose Route Frequency Provider Last Rate Last Admin   0.9 %  sodium chloride  infusion  250 mL Intravenous PRN Robins, Joshua E, MD       sodium chloride  flush (NS) 0.9 % injection 3 mL  3 mL Intravenous Q12H  Robins, Joshua E, MD        Allergies as of 12/29/2024 - Review Complete 11/08/2024  Allergen Reaction Noted   Covid-19 (mrna) vaccine Swelling 03/13/2020   Morphine  and codeine  Nausea And Vomiting 09/19/2021   Remeron  [mirtazapine ] Other (See Comments) 05/05/2021   Zoloft  [sertraline ] Other (See Comments) 11/08/2020   Elavil  [amitriptyline ] Other (See Comments) 02/01/2019   Trazodone  and nefazodone Other (See Comments) 06/10/2018    Family History  Problem Relation Age of Onset   Hypertension Mother    Hypertension Father    Hypertension Sister    Breast cancer Sister 58   Diabetes Sister    Hypertension Sister    Hypertension Sister    Diabetes Brother    Hypertension Brother    Cirrhosis Brother    Other Brother        COVID-19   Asthma Son    Thyroid disease Daughter     Review of Systems:    Constitutional: No weight loss, fever, chills, weakness or fatigue HEENT: Eyes: No change in vision               Ears, Nose, Throat:  No change in hearing or congestion Skin: No rash or itching Cardiovascular: No chest pain, chest pressure or palpitations   Respiratory: No SOB or cough Gastrointestinal: See HPI and otherwise negative Genitourinary: No dysuria or change in urinary frequency Neurological: No headache, dizziness or syncope Musculoskeletal: No new muscle or joint pain Hematologic: No bleeding or bruising Psychiatric: No history of depression or anxiety    Physical Exam:  Vital signs: LMP 02/11/2012 Comment: tubal ligation  Constitutional:   Pleasant *** female/female appears to be in NAD, Well developed, Well nourished, alert and cooperative Eyes:   PEERL, EOMI. No icterus. Conjunctiva pink. Neck:  Supple Throat: Oral cavity and pharynx without inflammation, swelling or lesion.  Respiratory: Respirations even and unlabored. Lungs clear to auscultation bilaterally.   No wheezes, crackles, or rhonchi.  Cardiovascular: Normal S1, S2. Regular rate and rhythm. No  peripheral edema, cyanosis or pallor.  Gastrointestinal:  Soft, nondistended, nontender. No rebound or guarding. Normal bowel sounds. No appreciable masses or hepatomegaly. Rectal:  Not performed.  Anoscopy: Msk:  Symmetrical without gross deformities. Without edema, no deformity or joint abnormality.  Neurologic:  Alert and  oriented x4;  grossly normal neurologically.  Skin:   Dry and intact without significant lesions or rashes.  RELEVANT LABS AND IMAGING: CBC    Latest Ref Rng & Units 06/12/2022   11:06 AM  11/11/2021    4:48 PM 09/26/2021    8:27 AM  CBC  WBC 4.0 - 10.5 K/uL  7.5    Hemoglobin 12.0 - 15.0 g/dL 87.7  86.9  86.6   Hematocrit 36.0 - 46.0 % 36.0  37.9  39.0   Platelets 150 - 400 K/uL  174       CMP     Latest Ref Rng & Units 06/12/2022   11:06 AM 03/09/2022    9:35 AM 11/11/2021    4:48 PM  CMP  Glucose 70 - 99 mg/dL 80  84  75   BUN 6 - 20 mg/dL 28  59  20   Creatinine 0.44 - 1.00 mg/dL 3.49  1.79  5.92   Sodium 135 - 145 mmol/L 137  137  134   Potassium 3.5 - 5.1 mmol/L 4.0  4.3  3.0   Chloride 98 - 111 mmol/L 96  96  91   CO2 20 - 29 mmol/L  23  25   Calcium 8.7 - 10.2 mg/dL  9.8  9.7      Lab Results  Component Value Date   TSH 2.340 03/09/2022   08/2023 labs show: h pylori breath test negative    01/2016 EGD at [facility] Patent Schatzki ring Mild gastritis/duodenitis   01/2016 colonoscopy at [facility] Heme positive stools d/t colon polyps/internal hemorrhoids Small internal hemorrhoids Moderate external hemorrhoids Path: Diagnosis 1. Colon, polyp(s), hepatic flexure, distal tranverse - HYPERPLASTIC POLYP(X1). - THERE IS NO EVIDENCE OF MALIGNANCY.  2. Colon, polyp(s), descending - TUBULAR ADENOMA(X1). - HIGH GRADE DYSPLASIA IS NOT IDENTIFIED.  3. Colon, polyp(s), sigmoid - HYPERPLASTIC POLYP(S). - THERE IS NO EVIDENCE OF MALIGNANCY.  4. Stomach, biopsy, and polyps FUNDIC GLAND POLYP REACTIVE GASTROPATHY NO HELICOBACTER PYLORI ORGANISMS ARE IDENTIFIED  (WARTHIN-STARRY STAIN) NEGATIVE FOR DYSPLASIA OR MALIGNANCY    Assessment: 1. ***  Plan: 1. ***   Thank you for the courtesy of this consult. Please call me with any questions or concerns.   Ebone Alcivar, FNP-C [facility] 12/29/2024, 7:15 AM  Cc: Vicci Barnie NOVAK, MD  "

## 2025-01-09 ENCOUNTER — Other Ambulatory Visit: Payer: Self-pay

## 2025-01-10 ENCOUNTER — Other Ambulatory Visit: Payer: Self-pay

## 2025-01-12 ENCOUNTER — Other Ambulatory Visit: Payer: Self-pay

## 2025-01-19 ENCOUNTER — Ambulatory Visit: Payer: Self-pay | Attending: Internal Medicine | Admitting: Internal Medicine

## 2025-01-19 VITALS — BP 97/63 | HR 86 | Ht <= 58 in | Wt 106.0 lb

## 2025-01-19 DIAGNOSIS — Z992 Dependence on renal dialysis: Secondary | ICD-10-CM

## 2025-01-19 DIAGNOSIS — Z131 Encounter for screening for diabetes mellitus: Secondary | ICD-10-CM

## 2025-01-19 DIAGNOSIS — Z1231 Encounter for screening mammogram for malignant neoplasm of breast: Secondary | ICD-10-CM

## 2025-01-19 DIAGNOSIS — R748 Abnormal levels of other serum enzymes: Secondary | ICD-10-CM | POA: Diagnosis not present

## 2025-01-19 DIAGNOSIS — I129 Hypertensive chronic kidney disease with stage 1 through stage 4 chronic kidney disease, or unspecified chronic kidney disease: Secondary | ICD-10-CM | POA: Diagnosis not present

## 2025-01-19 DIAGNOSIS — D649 Anemia, unspecified: Secondary | ICD-10-CM

## 2025-01-19 DIAGNOSIS — N186 End stage renal disease: Secondary | ICD-10-CM | POA: Diagnosis not present

## 2025-01-19 DIAGNOSIS — F5104 Psychophysiologic insomnia: Secondary | ICD-10-CM

## 2025-01-19 DIAGNOSIS — F320 Major depressive disorder, single episode, mild: Secondary | ICD-10-CM | POA: Diagnosis not present

## 2025-01-19 NOTE — Progress Notes (Signed)
 "   Patient ID: Sabrina Mitchell, female    DOB: [birth year]  MRN: [id number]  CC: Hypertension (HTN f/u. Layvonne routine blood work - lipid, A1C. Concern about delayed healing/Already received flu vax)   Subjective: Sabrina Mitchell is a 64 y.o. female who presents for chronic ds management. Her chronic medical issues include:  ESRD on HD, HTN, ACD, GERD, vit D def, external hemorrhoids and insomnia.   AMN Language interpreter used during this encounter. #[id number]  Discussed the use of AI scribe software for clinical note transcription with the patient, who gave verbal consent to proceed.  History of Present Illness Sabrina Mitchell is a 64 year old female with hypertension and end-stage renal disease on dialysis who presents for follow-up of her chronic medical conditions.  HTN/ESRD: She is currently taking carvedilol  12.5 mg twice daily and amlodipine  5 mg twice daily for hypertension. Her blood pressure at home typically ranges from 130-140/80-85 mmHg, but it can fluctuate during dialysis sessions. She attends dialysis every Tuesday, Thursday, and Saturday.  She completed a depression and anxiety screening questionnaire today. She feels sad at times due to being on dialysis but also has periods where she feels well. She does not wish to take medication for depression and denies any thoughts of self-harm or harm to others.  She is taking Ambien  10 mg for insomnia and reports doing well with this medication.  She requested blood tests today, including cholesterol and diabetes screening. She notes that cuts take a long time to heal, which raises her concern about diabetes. Her family history is significant for diabetes and hypertension, with her brother, sister, and nephew affected by these conditions.    Patient Active Problem List   Diagnosis Date Noted   Psychophysiological insomnia 02/13/2021   New onset of headaches 02/13/2021   Depression with anxiety  10/30/2020   Pulmonary edema 09/26/2019   Chest pain 09/26/2019   Elevated troponin 09/26/2019   Shortness of breath 05/16/2019   Calcaneal spur of right foot 10/10/2018   Ulnar neuropathy of left upper extremity 03/11/2018   GERD (gastroesophageal reflux disease) 01/17/2016   Pseudoaneurysm of arteriovenous dialysis fistula 10/23/2015   Dependence on renal dialysis 10/15/2015   Pruritus, unspecified 12/26/2014   Diarrhea, unspecified 09/13/2014   Fever, unspecified 08/30/2014   Pain, unspecified 08/30/2014   Generalized abdominal pain 04/05/2013   Post-menopausal bleeding 04/06/2012   End stage renal disease on dialysis [facility]) 04/06/2012   Hypertension 04/06/2012   Anemia in chronic kidney disease 03/10/2012   Secondary hyperparathyroidism of renal origin 03/10/2012   Iron deficiency anemia, unspecified 01/23/2012   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) 03/25/2010     Medications Ordered Prior to Encounter[1]  Allergies[2]  Social History   Socioeconomic History   Marital status: Single    Spouse name: Not on file   Number of children: 2   Years of education: Not on file   Highest education level: Not on file  Occupational History   Not on file  Tobacco Use   Smoking status: Never    Passive exposure: Current   Smokeless tobacco: Never  Vaping Use   Vaping status: Never Used  Substance and Sexual Activity   Alcohol use: No   Drug use: No   Sexual activity: Not Currently    Birth control/protection: Surgical  Other Topics Concern   Not on file  Social History Narrative   Not on file  Social Drivers of Health   Tobacco Use: Medium Risk (11/08/2024)   Patient History    Smoking Tobacco Use: Never    Smokeless Tobacco Use: Never    Passive Exposure: Current  Financial Resource Strain: High Risk (01/14/2024)   Overall Financial Resource Strain (CARDIA)    Difficulty of Paying Living Expenses:  Very hard  Food Insecurity: Food Insecurity Present (01/14/2024)   Hunger Vital Sign    Worried About Running Out of Food in the Last Year: Sometimes true    Ran Out of Food in the Last Year: Sometimes true  Transportation Needs: No Transportation Needs (01/14/2024)   PRAPARE - Administrator, Civil Service (Medical): No    Lack of Transportation (Non-Medical): No  Physical Activity: Insufficiently Active (01/14/2024)   Exercise Vital Sign    Days of Exercise per Week: 3 days    Minutes of Exercise per Session: 30 min  Stress: Stress Concern Present (01/14/2024)   [employer] of Occupational Health - Occupational Stress Questionnaire    Feeling of Stress : To some extent  Social Connections: Socially Integrated (01/14/2024)   Social Connection and Isolation Panel    Frequency of Communication with Friends and Family: More than three times a week    Frequency of Social Gatherings with Friends and Family: More than three times a week    Attends Religious Services: More than 4 times per year    Active Member of Clubs or Organizations: Yes    Attends Banker Meetings: 1 to 4 times per year    Marital Status: Living with partner  Intimate Partner Violence: Not At Risk (01/14/2024)   Humiliation, Afraid, Rape, and Kick questionnaire    Fear of Current or Ex-Partner: No    Emotionally Abused: No    Physically Abused: No    Sexually Abused: No  Depression (PHQ2-9): High Risk (01/19/2025)   Depression (PHQ2-9)    PHQ-2 Score: 14  Alcohol Screen: Low Risk (01/14/2024)   Alcohol Screen    Last Alcohol Screening Score (AUDIT): 0  Housing: Low Risk (01/14/2024)   Housing Stability Vital Sign    Unable to Pay for Housing in the Last Year: No    Number of Times Moved in the Last Year: 0    Homeless in the Last Year: No  Utilities: Not At Risk (01/14/2024)   AHC Utilities    Threatened with loss of utilities: No  Health Literacy: Inadequate Health Literacy (01/14/2024)    B1300 Health Literacy    Frequency of need for help with medical instructions: Sometimes    Family History  Problem Relation Age of Onset   Hypertension Mother    Hypertension Father    Hypertension Sister    Breast cancer Sister 30   Diabetes Sister    Hypertension Sister    Hypertension Sister    Diabetes Brother    Hypertension Brother    Cirrhosis Brother    Other Brother        COVID-19   Asthma Son    Thyroid disease Daughter     Past Surgical History:  Procedure Laterality Date   A/V FISTULAGRAM Right 06/12/2022   Procedure: A/V Fistulagram;  Surgeon: Eliza Lonni RAMAN, MD;  Location: [facility] INVASIVE CV LAB;  Service: Cardiovascular;  Laterality: Right;   AV FISTULA PLACEMENT Left 07/05/09   Dr. Gerlean   AV FISTULA PLACEMENT Right 09/26/2021   Procedure: RIGHT ARM ARTERIOVENOUS (AV) FISTULA CREATION;  Surgeon: Sheree Penne Lonni, MD;  Location: [facility] OR;  Service: Vascular;  Laterality: Right;   COLONOSCOPY N/A 02/10/2016   SLF: 1. HEME postive stool due to colon polyps intrernal hemorrhoids 2. small internal hemrrohoids 3. moderate external hemorrhoids.    ESOPHAGEAL DILATION N/A 02/10/2016   Procedure: ESOPHAGEAL DILATION;  Surgeon: Margo LITTIE Haddock, MD;  Location: [facility] ENDO SUITE;  Service: Endoscopy;  Laterality: N/A;   ESOPHAGOGASTRODUODENOSCOPY N/A 02/10/2016   SLF: patent Schatzki's ring , mild gastritis/ duodentitis.    FISTULA SUPERFICIALIZATION Left 11/04/2018   Procedure: PLICATION BRACHIOCEPHALIC FISTULA;  Surgeon: Eliza Lonni RAMAN, MD;  Location: [facility] OR;  Service: Vascular;  Laterality: Left;   FISTULOGRAM N/A 10/22/2014   Procedure: FISTULOGRAM;  Surgeon: Lonni RAMAN Eliza, MD;  Location: [facility] CATH LAB;  Service: Cardiovascular;  Laterality: N/A;   INSERTION OF DIALYSIS CATHETER Right 08/27/2021   Procedure: INSERTION OF 19cm TUNNELED DIALYSIS CATHETER INTO RIGHT NECK;  Surgeon: Gretta Lonni PARAS, MD;  Location: [facility] OR;  Service: Vascular;   Laterality: Right;   IR DIALY SHUNT INTRO NEEDLE/INTRACATH INITIAL W/IMG LEFT Left 02/01/2019   IR DIALY SHUNT INTRO NEEDLE/INTRACATH INITIAL W/IMG LEFT Left 06/05/2020   IR DIALY SHUNT INTRO NEEDLE/INTRACATH INITIAL W/IMG LEFT Left 05/09/2021   IR REMOVAL TUN CV CATH W/O FL  12/04/2022   IR THROMBECTOMY AV FISTULA W/THROMBOLYSIS/PTA INC/SHUNT/IMG LEFT Left 08/18/2021   IR US  GUIDE VASC ACCESS LEFT  08/18/2021   PERIPHERAL VASCULAR BALLOON ANGIOPLASTY Left 07/14/2021   Procedure: PERIPHERAL VASCULAR BALLOON ANGIOPLASTY;  Surgeon: Sheree Penne Lonni, MD;  Location: [facility] INVASIVE CV LAB;  Service: Cardiovascular;  Laterality: Left;   PERIPHERAL VASCULAR CATHETERIZATION Left 12/09/2016   Procedure: A/V Fistulagram;  Surgeon: Penne Lonni Sheree, MD;  Location: [facility] INVASIVE CV LAB;  Service: Cardiovascular;  Laterality: Left;   REVISON OF ARTERIOVENOUS FISTULA Left 10/28/2015   Procedure: PLICATION OF BRACHIOCEPHALIC FISTULA;  Surgeon: Redell LITTIE Door, MD;  Location: [facility] OR;  Service: Vascular;  Laterality: Left;   REVISON OF ARTERIOVENOUS FISTULA Left 08/09/2017   Procedure: REVISION OF ARTERIOVENOUS FISTULA LIGATION OF SIDE BRANCH;  Surgeon: Sheree Penne Lonni, MD;  Location: [facility] OR;  Service: Vascular;  Laterality: Left;   TUBAL LIGATION  2004    ROS: Review of Systems Negative except as stated above  PHYSICAL EXAM: BP 97/63 (BP Location: Left Leg, Patient Position: Sitting, Cuff Size: Normal)   Pulse 86   Ht 4' 9 (1.448 m)   Wt 106 lb (48.1 kg)   LMP 02/11/2012 Comment: tubal ligation  SpO2 97%   BMI 22.94 kg/m   Physical Exam  General appearance - alert, well appearing, and in no distress Mental status - normal mood, behavior, speech, dress, motor activity, and thought processes Neck - supple, no significant adenopathy Chest - clear to auscultation, no wheezes, rales or rhonchi, symmetric air entry Heart - normal rate, regular rhythm, normal S1, S2, no murmurs, rubs, clicks or  gallops Extremities - serpinginous HD fistula LUE, smaller one in RUE both with palpable thrills      01/19/2025    3:15 PM 01/14/2024   11:51 AM 08/09/2023    2:32 PM  Depression screen PHQ 2/9  Decreased Interest 2 1 1   Down, Depressed, Hopeless 2 1 1   PHQ - 2 Score 4 2 2   Altered sleeping 2 1 1   Tired, decreased energy 2 1 1   Change in appetite 0 0 0  Feeling bad or failure about yourself  2 1 1   Trouble concentrating 2 2 1   Moving slowly or  fidgety/restless 2 2 1   Suicidal thoughts 0 0 0  PHQ-9 Score 14 9  7    Difficult doing work/chores Very difficult       Data saved with a previous flowsheet row definition      01/19/2025    3:15 PM 01/14/2024   11:51 AM 08/09/2023    2:33 PM 07/09/2023   10:05 AM  GAD 7 : Generalized Anxiety Score  Nervous, Anxious, on Edge 2 1  1  1    Control/stop worrying 2 1  1  1    Worry too much - different things 2 1  1  1    Trouble relaxing 0 1  1  1    Restless 1 1  1  1    Easily annoyed or irritable 1 1  1  1    Afraid - awful might happen 1 1  1  1    Total GAD 7 Score 9 7 7 7   Anxiety Difficulty Somewhat difficult Somewhat difficult       Data saved with a previous flowsheet row definition        Latest Ref Rng & Units 01/19/2025    3:46 PM 06/12/2022   11:06 AM 03/09/2022    9:35 AM  CMP  Glucose 70 - 99 mg/dL  80  84   BUN 6 - 20 mg/dL  28  59   Creatinine 9.55 - 1.00 mg/dL  3.49  1.79   Sodium 864 - 145 mmol/L  137  137   Potassium 3.5 - 5.1 mmol/L  4.0  4.3   Chloride 98 - 111 mmol/L  96  96   CO2 20 - 29 mmol/L   23   Calcium 8.7 - 10.2 mg/dL   9.8   Total Protein 6.0 - 8.5 g/dL 7.2     Total Bilirubin 0.0 - 1.2 mg/dL 0.4     Alkaline Phos 49 - 135 IU/L 433     AST 0 - 40 IU/L 21     ALT 0 - 32 IU/L 11      Lipid Panel     Component Value Date/Time   CHOL 178 01/19/2025 1546   TRIG 269 (H) 01/19/2025 1546   HDL 68 01/19/2025 1546   CHOLHDL 2.6 01/19/2025 1546   CHOLHDL 2.8 Ratio 10/22/2010 2104   VLDL 30 10/22/2010  2104   LDLCALC 68 01/19/2025 1546    CBC    Component Value Date/Time   WBC 5.1 01/19/2025 1546   WBC 7.5 11/11/2021 1648   RBC 3.50 (L) 01/19/2025 1546   RBC 4.31 11/11/2021 1648   HGB 11.3 01/19/2025 1546   HCT 33.6 (L) 01/19/2025 1546   PLT 186 01/19/2025 1546   MCV 96 01/19/2025 1546   MCH 32.3 01/19/2025 1546   MCH 30.2 11/11/2021 1648   MCHC 33.6 01/19/2025 1546   MCHC 34.3 11/11/2021 1648   RDW 13.1 01/19/2025 1546   LYMPHSABS 1.1 08/17/2021 1243   LYMPHSABS 1.1 03/12/2021 0954   MONOABS 0.3 08/17/2021 1243   EOSABS 0.0 08/17/2021 1243   EOSABS 0.1 03/12/2021 0954   BASOSABS 0.0 08/17/2021 1243   BASOSABS 0.0 03/12/2021 0954    ASSESSMENT AND PLAN: 1. Renal hypertension (Primary) Blood pressure little on the low side today but she is asymptomatic.  Blood pressure fluctuates; patient will continue carvedilol  12.5 mg twice a day and amlodipine  5 mg daily - CBC - Lipid panel - Hepatic function panel  2. ESRD on dialysis [facility]) Patient to continue dialysis sessions  3 times a week.  3. Major depressive disorder, single episode, mild Positive depression screen today.  Her chronic medical conditions play a role in her depression.  However she does not feel that she needs any medication or referral for counseling at this time.  4. Chronic insomnia Reports doing well on Ambien  without any significant side effects.  5. Diabetes mellitus screening Patient with strong family history of diabetes.  Request screening today. - Hemoglobin A1c  6. Encounter for screening mammogram for malignant neoplasm of breast Mammogram scholarship given. - MM 3D SCREENING MAMMOGRAM BILATERAL BREAST; Future  Patient was given the opportunity to ask questions.  Patient verbalized understanding of the plan and was able to repeat key elements of the plan.   This documentation was completed using Paediatric nurse.  Any transcriptional errors are unintentional.  Orders  Placed This Encounter  Procedures   MM 3D SCREENING MAMMOGRAM BILATERAL BREAST   CBC   Lipid panel   Hepatic function panel   Hemoglobin A1c     Requested Prescriptions    No prescriptions requested or ordered in this encounter    Return in about 6 months (around 07/19/2025).  Barnie Louder, MD, FACP     [1]  Current Outpatient Medications on File Prior to Visit  Medication Sig Dispense Refill   acetaminophen  (TYLENOL ) 500 MG tablet Take 500-1,000 mg by mouth every 6 (six) hours as needed for moderate pain or headache.     amLODipine  (NORVASC ) 5 MG tablet Take 1 tablet (5 mg total) by mouth 2 (two) times daily for blood pressure. 180 tablet 1   B Complex-C-Folic Acid (DIALYVITE 800) 0.8 MG TABS Take 1 tablet by mouth daily at 2 PM.     calcium carbonate (TUMS - DOSED IN MG ELEMENTAL CALCIUM) 500 MG chewable tablet Chew 1-2 tablets by mouth 3 (three) times daily as needed for indigestion or heartburn.     carvedilol  (COREG ) 12.5 MG tablet Take 1 tablet (12.5 mg total) by mouth 2 (two) times daily for blood pressure. 180 tablet 3   cinacalcet  (SENSIPAR ) 90 MG tablet Take 1 tablet (90 mg total) by mouth daily. TAKE WITH LARGEST MEAL. 30 tablet 11   dicyclomine  (BENTYL ) 10 MG capsule Take 1 capsule (10 mg total) by mouth 4 (four) times daily -  before meals and at bedtime. For stomach pain 60 capsule 1   ethyl chloride spray Spray 1 as directed to skin three times a week as directed apply a small amount of spray to dialysis access just before needle stick 116 mL 11   ferric citrate (AURYXIA) 1 GM 210 MG(Fe) tablet Take 420-630 mg by mouth See admin instructions. Take 2-3 tablets (420 - 630 mg) by mouth with each meal or snack     hydrocortisone  (ANUSOL -HC) 2.5 % rectal cream Place 1 Application rectally 2 (two) times daily. 30 g 1   Omega-3 Fatty Acids (OMEGA 3 500) 500 MG CAPS Take 500 mg by mouth every other day.     omeprazole  (PRILOSEC) 20 MG capsule Take 1 capsule (20 mg total)  by mouth 2 (two) times daily before a meal.(30 mins prior to breakfast and supper) 60 capsule 6   zolpidem  (AMBIEN ) 10 MG tablet Take 1 tablet (10 mg total) by mouth at bedtime as needed for sleep. 30 tablet 3   cinacalcet  (SENSIPAR ) 60 MG tablet Take 60 mg by mouth daily. (Patient not taking: Reported on 01/19/2025)     ethyl chloride spray Spray 1  as directed to skin three times a week as directed apply a small amount of spray to dialysis access just before needle stick (Patient not taking: Reported on 01/19/2025) 116 mL 11   hydrocortisone  (ANUSOL -HC) 2.5 % rectal cream Place 1 Application rectally 2 (two) times daily. (Patient not taking: Reported on 01/19/2025) 30 g 0   hydrocortisone  (ANUSOL -HC) 25 MG suppository Place 1 suppository (25 mg total) rectally 2 (two) times daily as needed for hemorrhoids or anal itching. (Patient not taking: Reported on 01/19/2025) 12 suppository 0   lidocaine  (XYLOCAINE ) 5 % ointment Apply 1 Application topically as needed. (Patient not taking: Reported on 01/19/2025) 35.44 g 0   nystatin -triamcinolone  ointment (MYCOLOG) Apply 1 application topically 2 (two) times daily. (Patient not taking: Reported on 01/19/2025) 30 g 0   Current Facility-Administered Medications on File Prior to Visit  Medication Dose Route Frequency Provider Last Rate Last Admin   0.9 %  sodium chloride  infusion  250 mL Intravenous PRN Robins, Joshua E, MD       sodium chloride  flush (NS) 0.9 % injection 3 mL  3 mL Intravenous Q12H Robins, Joshua E, MD      [2]  Allergies Allergen Reactions   Covid-19 (Mrna) Vaccine Swelling    Tongue swelling   Morphine  And Codeine  Nausea And Vomiting   Remeron  [Mirtazapine ] Other (See Comments)    Chest pain   Zoloft  [Sertraline ] Other (See Comments)    Chest pain and chills   Elavil  [Amitriptyline ] Other (See Comments)    Makes her shake   Trazodone  And Nefazodone Other (See Comments)    Makes her shake   "

## 2025-01-19 NOTE — Patient Instructions (Signed)
" °  VISIT SUMMARY: Sabrina Mitchell is a 64 year old female who came in for a follow-up on her chronic medical conditions, including hypertension and end-stage renal disease on dialysis. She also discussed her concerns about depression, insomnia, and the need for diabetes screening.  YOUR PLAN: -END-STAGE RENAL DISEASE ON DIALYSIS: End-stage renal disease is the final stage of chronic kidney disease where the kidneys no longer function adequately, requiring dialysis. Continue attending dialysis sessions every Tuesday, Thursday, and Saturday.  -HYPERTENSION: Hypertension is high blood pressure. Continue taking carvedilol  12.5 mg twice daily and amlodipine  5 mg twice daily to manage your blood pressure.  -DEPRESSION: Depression is a mood disorder characterized by persistent sadness. You screened positive for depression but declined medication or counseling. If you change your mind, counseling is available.  -INSOMNIA: Insomnia is difficulty falling or staying asleep. Continue taking Ambien  10 mg as prescribed to help with sleep.  -DIABETES SCREENING: Diabetes is a condition where blood sugar levels are too high. Due to your delayed wound healing and family history, we will order blood tests to screen for diabetes.  -GENERAL HEALTH MAINTENANCE: You are overdue for a mammogram, which is an important screening test for breast cancer. We will provide a mammogram scholarship for scheduling and arrange for your mammogram appointment.  INSTRUCTIONS: Please return for a follow-up appointment in 4 to 5 months. We will schedule this appointment for you.    Contains text generated by [employer]bridge.   "

## 2025-01-20 ENCOUNTER — Encounter: Payer: Self-pay | Admitting: Internal Medicine

## 2025-01-20 LAB — HEMOGLOBIN A1C
Est. average glucose Bld gHb Est-mCnc: 91 mg/dL
Hgb A1c MFr Bld: 4.8 % (ref 4.8–5.6)

## 2025-01-20 LAB — CBC
Hematocrit: 33.6 % — ABNORMAL LOW (ref 34.0–46.6)
Hemoglobin: 11.3 g/dL (ref 11.1–15.9)
MCH: 32.3 pg (ref 26.6–33.0)
MCHC: 33.6 g/dL (ref 31.5–35.7)
MCV: 96 fL (ref 79–97)
Platelets: 186 10*3/uL (ref 150–450)
RBC: 3.5 x10E6/uL — ABNORMAL LOW (ref 3.77–5.28)
RDW: 13.1 % (ref 11.7–15.4)
WBC: 5.1 10*3/uL (ref 3.4–10.8)

## 2025-01-20 LAB — LIPID PANEL
Chol/HDL Ratio: 2.6 ratio (ref 0.0–4.4)
Cholesterol, Total: 178 mg/dL (ref 100–199)
HDL: 68 mg/dL
LDL Chol Calc (NIH): 68 mg/dL (ref 0–99)
Triglycerides: 269 mg/dL — ABNORMAL HIGH (ref 0–149)
VLDL Cholesterol Cal: 42 mg/dL — ABNORMAL HIGH (ref 5–40)

## 2025-01-20 LAB — HEPATIC FUNCTION PANEL
ALT: 11 [IU]/L (ref 0–32)
AST: 21 [IU]/L (ref 0–40)
Albumin: 4.5 g/dL (ref 3.9–4.9)
Alkaline Phosphatase: 433 [IU]/L — ABNORMAL HIGH (ref 49–135)
Bilirubin Total: 0.4 mg/dL (ref 0.0–1.2)
Bilirubin, Direct: 0.16 mg/dL (ref 0.00–0.40)
Total Protein: 7.2 g/dL (ref 6.0–8.5)

## 2025-01-21 ENCOUNTER — Ambulatory Visit: Payer: Self-pay | Admitting: Internal Medicine

## 2025-01-21 NOTE — Addendum Note (Signed)
 Addended by: VICCI SOBER B on: 01/21/2025 04:37 PM   Modules accepted: Orders

## 2025-01-24 ENCOUNTER — Other Ambulatory Visit: Payer: Self-pay

## 2025-01-24 LAB — GAMMA GT: GGT: 9 [IU]/L (ref 0–60)

## 2025-01-24 LAB — SPECIMEN STATUS REPORT

## 2025-01-24 LAB — IRON,TIBC AND FERRITIN PANEL
Ferritin: 3510 ng/mL — ABNORMAL HIGH (ref 15–150)
Iron Saturation: 37 % (ref 15–55)
Iron: 72 ug/dL (ref 27–139)
Total Iron Binding Capacity: 197 ug/dL — ABNORMAL LOW (ref 250–450)
UIBC: 125 ug/dL (ref 118–369)

## 2025-03-19 ENCOUNTER — Ambulatory Visit: Payer: Self-pay | Admitting: Gastroenterology

## 2025-07-20 ENCOUNTER — Ambulatory Visit: Payer: Self-pay | Admitting: Internal Medicine
